# Patient Record
Sex: Female | Born: 1985
Health system: Southern US, Community
[De-identification: ages and names within clinical notes are randomized; demographics above are authoritative.]

## PROBLEM LIST (undated history)

## (undated) ENCOUNTER — Inpatient Hospital Stay (HOSPITAL_COMMUNITY): Payer: Self-pay

## (undated) DIAGNOSIS — D573 Sickle-cell trait: Secondary | ICD-10-CM

## (undated) DIAGNOSIS — O139 Gestational [pregnancy-induced] hypertension without significant proteinuria, unspecified trimester: Secondary | ICD-10-CM

## (undated) DIAGNOSIS — Z87898 Personal history of other specified conditions: Secondary | ICD-10-CM

## (undated) DIAGNOSIS — A609 Anogenital herpesviral infection, unspecified: Secondary | ICD-10-CM

## (undated) DIAGNOSIS — Z8619 Personal history of other infectious and parasitic diseases: Secondary | ICD-10-CM

## (undated) DIAGNOSIS — O24419 Gestational diabetes mellitus in pregnancy, unspecified control: Secondary | ICD-10-CM

## (undated) DIAGNOSIS — Z8742 Personal history of other diseases of the female genital tract: Secondary | ICD-10-CM

## (undated) DIAGNOSIS — R011 Cardiac murmur, unspecified: Secondary | ICD-10-CM

## (undated) DIAGNOSIS — Z8719 Personal history of other diseases of the digestive system: Secondary | ICD-10-CM

## (undated) DIAGNOSIS — F32A Depression, unspecified: Secondary | ICD-10-CM

## (undated) DIAGNOSIS — F329 Major depressive disorder, single episode, unspecified: Secondary | ICD-10-CM

## (undated) DIAGNOSIS — D649 Anemia, unspecified: Secondary | ICD-10-CM

## (undated) DIAGNOSIS — F419 Anxiety disorder, unspecified: Secondary | ICD-10-CM

## (undated) DIAGNOSIS — R87629 Unspecified abnormal cytological findings in specimens from vagina: Secondary | ICD-10-CM

## (undated) DIAGNOSIS — I10 Essential (primary) hypertension: Secondary | ICD-10-CM

## (undated) DIAGNOSIS — G43909 Migraine, unspecified, not intractable, without status migrainosus: Secondary | ICD-10-CM

## (undated) DIAGNOSIS — K219 Gastro-esophageal reflux disease without esophagitis: Secondary | ICD-10-CM

## (undated) HISTORY — DX: Gestational diabetes mellitus in pregnancy, unspecified control: O24.419

## (undated) HISTORY — DX: Major depressive disorder, single episode, unspecified: F32.9

## (undated) HISTORY — PX: COLPOSCOPY: SHX161

## (undated) HISTORY — DX: Unspecified abnormal cytological findings in specimens from vagina: R87.629

## (undated) HISTORY — DX: Anogenital herpesviral infection, unspecified: A60.9

## (undated) HISTORY — DX: Depression, unspecified: F32.A

## (undated) HISTORY — PX: OTHER SURGICAL HISTORY: SHX169

## (undated) HISTORY — DX: Gestational (pregnancy-induced) hypertension without significant proteinuria, unspecified trimester: O13.9

---

## 2007-10-09 HISTORY — PX: DILATION AND CURETTAGE OF UTERUS: SHX78

## 2008-09-20 ENCOUNTER — Emergency Department (HOSPITAL_COMMUNITY): Admission: EM | Admit: 2008-09-20 | Discharge: 2008-09-20 | Payer: Self-pay | Admitting: *Deleted

## 2008-12-11 ENCOUNTER — Ambulatory Visit: Payer: Self-pay | Admitting: Obstetrics and Gynecology

## 2008-12-11 ENCOUNTER — Inpatient Hospital Stay (HOSPITAL_COMMUNITY): Admission: AD | Admit: 2008-12-11 | Discharge: 2008-12-11 | Payer: Self-pay | Admitting: Obstetrics & Gynecology

## 2009-02-01 ENCOUNTER — Inpatient Hospital Stay (HOSPITAL_COMMUNITY): Admission: AD | Admit: 2009-02-01 | Discharge: 2009-02-01 | Payer: Self-pay | Admitting: Obstetrics & Gynecology

## 2009-02-18 ENCOUNTER — Emergency Department (HOSPITAL_COMMUNITY): Admission: EM | Admit: 2009-02-18 | Discharge: 2009-02-18 | Payer: Self-pay | Admitting: Emergency Medicine

## 2009-02-28 ENCOUNTER — Inpatient Hospital Stay (HOSPITAL_COMMUNITY): Admission: AD | Admit: 2009-02-28 | Discharge: 2009-02-28 | Payer: Self-pay | Admitting: Obstetrics & Gynecology

## 2009-03-27 ENCOUNTER — Emergency Department (HOSPITAL_COMMUNITY): Admission: EM | Admit: 2009-03-27 | Discharge: 2009-03-27 | Payer: Self-pay | Admitting: Emergency Medicine

## 2009-04-15 ENCOUNTER — Emergency Department (HOSPITAL_COMMUNITY): Admission: EM | Admit: 2009-04-15 | Discharge: 2009-04-15 | Payer: Self-pay | Admitting: Emergency Medicine

## 2009-04-27 ENCOUNTER — Ambulatory Visit: Payer: Self-pay | Admitting: Obstetrics and Gynecology

## 2009-04-27 ENCOUNTER — Encounter: Payer: Self-pay | Admitting: Physician Assistant

## 2009-04-27 LAB — CONVERTED CEMR LAB: TSH: 0.906 microintl units/mL (ref 0.350–4.500)

## 2009-04-30 ENCOUNTER — Emergency Department (HOSPITAL_COMMUNITY): Admission: EM | Admit: 2009-04-30 | Discharge: 2009-04-30 | Payer: Self-pay | Admitting: Emergency Medicine

## 2009-05-12 ENCOUNTER — Inpatient Hospital Stay (HOSPITAL_COMMUNITY): Admission: AD | Admit: 2009-05-12 | Discharge: 2009-05-12 | Payer: Self-pay | Admitting: Obstetrics & Gynecology

## 2009-06-05 ENCOUNTER — Emergency Department (HOSPITAL_COMMUNITY): Admission: EM | Admit: 2009-06-05 | Discharge: 2009-06-06 | Payer: Self-pay | Admitting: Emergency Medicine

## 2009-08-11 ENCOUNTER — Emergency Department (HOSPITAL_COMMUNITY): Admission: EM | Admit: 2009-08-11 | Discharge: 2009-08-11 | Payer: Self-pay | Admitting: Emergency Medicine

## 2009-10-01 ENCOUNTER — Inpatient Hospital Stay (HOSPITAL_COMMUNITY): Admission: AD | Admit: 2009-10-01 | Discharge: 2009-10-01 | Payer: Self-pay | Admitting: Obstetrics & Gynecology

## 2009-10-21 ENCOUNTER — Ambulatory Visit: Payer: Self-pay | Admitting: Obstetrics & Gynecology

## 2009-10-31 ENCOUNTER — Encounter: Admission: RE | Admit: 2009-10-31 | Discharge: 2009-10-31 | Payer: Self-pay | Admitting: Obstetrics & Gynecology

## 2009-11-02 ENCOUNTER — Encounter: Admission: RE | Admit: 2009-11-02 | Discharge: 2009-11-02 | Payer: Self-pay | Admitting: Obstetrics & Gynecology

## 2009-12-14 ENCOUNTER — Emergency Department (HOSPITAL_COMMUNITY): Admission: EM | Admit: 2009-12-14 | Discharge: 2009-12-15 | Payer: Self-pay | Admitting: Emergency Medicine

## 2009-12-21 ENCOUNTER — Emergency Department (HOSPITAL_COMMUNITY): Admission: EM | Admit: 2009-12-21 | Discharge: 2009-12-22 | Payer: Self-pay | Admitting: Emergency Medicine

## 2009-12-25 ENCOUNTER — Emergency Department (HOSPITAL_COMMUNITY): Admission: EM | Admit: 2009-12-25 | Discharge: 2009-12-25 | Payer: Self-pay | Admitting: Emergency Medicine

## 2009-12-26 ENCOUNTER — Emergency Department (HOSPITAL_COMMUNITY): Admission: EM | Admit: 2009-12-26 | Discharge: 2009-12-27 | Payer: Self-pay | Admitting: Emergency Medicine

## 2010-06-18 ENCOUNTER — Inpatient Hospital Stay (HOSPITAL_COMMUNITY): Admission: AD | Admit: 2010-06-18 | Discharge: 2010-06-18 | Payer: Self-pay | Admitting: Obstetrics

## 2010-06-18 ENCOUNTER — Ambulatory Visit: Payer: Self-pay | Admitting: Family

## 2010-07-01 ENCOUNTER — Emergency Department (HOSPITAL_COMMUNITY)
Admission: EM | Admit: 2010-07-01 | Discharge: 2010-07-01 | Payer: Self-pay | Source: Home / Self Care | Admitting: Emergency Medicine

## 2010-08-02 ENCOUNTER — Inpatient Hospital Stay (HOSPITAL_COMMUNITY)
Admission: AD | Admit: 2010-08-02 | Discharge: 2010-08-02 | Payer: Self-pay | Source: Home / Self Care | Admitting: Obstetrics

## 2010-08-03 ENCOUNTER — Inpatient Hospital Stay (HOSPITAL_COMMUNITY)
Admission: AD | Admit: 2010-08-03 | Discharge: 2010-08-03 | Payer: Self-pay | Source: Home / Self Care | Admitting: Obstetrics & Gynecology

## 2010-08-03 ENCOUNTER — Ambulatory Visit: Payer: Self-pay | Admitting: Nurse Practitioner

## 2010-08-03 DIAGNOSIS — N949 Unspecified condition associated with female genital organs and menstrual cycle: Secondary | ICD-10-CM

## 2010-08-03 DIAGNOSIS — N925 Other specified irregular menstruation: Secondary | ICD-10-CM

## 2010-09-14 ENCOUNTER — Emergency Department (HOSPITAL_COMMUNITY): Admission: EM | Admit: 2010-09-14 | Discharge: 2009-12-22 | Payer: Self-pay | Admitting: Emergency Medicine

## 2010-10-18 ENCOUNTER — Emergency Department (HOSPITAL_COMMUNITY)
Admission: EM | Admit: 2010-10-18 | Discharge: 2010-10-18 | Payer: Self-pay | Source: Home / Self Care | Admitting: Emergency Medicine

## 2010-10-23 LAB — PREGNANCY, URINE: Preg Test, Ur: NEGATIVE

## 2010-10-23 LAB — DIFFERENTIAL
Basophils Absolute: 0.1 10*3/uL (ref 0.0–0.1)
Basophils Relative: 1 % (ref 0–1)
Eosinophils Absolute: 0.3 10*3/uL (ref 0.0–0.7)
Eosinophils Relative: 3 % (ref 0–5)
Lymphocytes Relative: 31 % (ref 12–46)
Lymphs Abs: 2.3 10*3/uL (ref 0.7–4.0)
Monocytes Absolute: 0.5 10*3/uL (ref 0.1–1.0)
Monocytes Relative: 6 % (ref 3–12)
Neutro Abs: 4.4 10*3/uL (ref 1.7–7.7)
Neutrophils Relative %: 59 % (ref 43–77)

## 2010-10-23 LAB — URINALYSIS, ROUTINE W REFLEX MICROSCOPIC
Bilirubin Urine: NEGATIVE
Ketones, ur: NEGATIVE mg/dL
Leukocytes, UA: NEGATIVE
Nitrite: NEGATIVE
Protein, ur: NEGATIVE mg/dL
Specific Gravity, Urine: 1.027 (ref 1.005–1.030)
Urine Glucose, Fasting: NEGATIVE mg/dL
Urobilinogen, UA: 1 mg/dL (ref 0.0–1.0)
pH: 6.5 (ref 5.0–8.0)

## 2010-10-23 LAB — COMPREHENSIVE METABOLIC PANEL
ALT: 10 U/L (ref 0–35)
AST: 13 U/L (ref 0–37)
Albumin: 3.8 g/dL (ref 3.5–5.2)
Alkaline Phosphatase: 70 U/L (ref 39–117)
BUN: 13 mg/dL (ref 6–23)
CO2: 27 mEq/L (ref 19–32)
Calcium: 9.7 mg/dL (ref 8.4–10.5)
Chloride: 104 mEq/L (ref 96–112)
Creatinine, Ser: 0.52 mg/dL (ref 0.4–1.2)
GFR calc Af Amer: >60 mL/min (ref >60)
GFR calc non Af Amer: >60 mL/min (ref >60)
Glucose, Bld: 101 mg/dL — ABNORMAL HIGH (ref 70–99)
Potassium: 3.8 mEq/L (ref 3.5–5.1)
Sodium: 138 mEq/L (ref 135–145)
Total Bilirubin: 0.4 mg/dL (ref 0.3–1.2)
Total Protein: 7.5 g/dL (ref 6.0–8.3)

## 2010-10-23 LAB — CBC
HCT: 32.6 % — ABNORMAL LOW (ref 36.0–46.0)
Hemoglobin: 11.1 g/dL — ABNORMAL LOW (ref 12.0–15.0)
MCH: 25.9 pg — ABNORMAL LOW (ref 26.0–34.0)
MCHC: 34 g/dL (ref 30.0–36.0)
MCV: 76.2 fL — ABNORMAL LOW (ref 78.0–100.0)
Platelets: 383 10*3/uL (ref 150–400)
RBC: 4.28 MIL/uL (ref 3.87–5.11)
RDW: 15.1 % (ref 11.5–15.5)
WBC: 7.5 10*3/uL (ref 4.0–10.5)

## 2010-10-23 LAB — URINE MICROSCOPIC-ADD ON

## 2010-10-23 LAB — LIPASE, BLOOD: Lipase: 21 U/L (ref 11–59)

## 2010-10-29 ENCOUNTER — Encounter: Payer: Self-pay | Admitting: Obstetrics & Gynecology

## 2010-10-30 ENCOUNTER — Encounter: Payer: Self-pay | Admitting: Obstetrics & Gynecology

## 2010-11-15 ENCOUNTER — Other Ambulatory Visit: Payer: Self-pay | Admitting: Obstetrics & Gynecology

## 2010-11-15 DIAGNOSIS — Z1231 Encounter for screening mammogram for malignant neoplasm of breast: Secondary | ICD-10-CM

## 2010-11-20 ENCOUNTER — Ambulatory Visit: Payer: Self-pay

## 2010-11-30 ENCOUNTER — Emergency Department (HOSPITAL_COMMUNITY)
Admission: EM | Admit: 2010-11-30 | Discharge: 2010-11-30 | Disposition: A | Discharge status: Home / Self Care | Payer: Medicare Other | Attending: Emergency Medicine | Admitting: Emergency Medicine

## 2010-11-30 DIAGNOSIS — W540XXA Bitten by dog, initial encounter: Secondary | ICD-10-CM | POA: Insufficient documentation

## 2010-11-30 DIAGNOSIS — F3289 Other specified depressive episodes: Secondary | ICD-10-CM | POA: Insufficient documentation

## 2010-11-30 DIAGNOSIS — R51 Headache: Secondary | ICD-10-CM | POA: Insufficient documentation

## 2010-11-30 DIAGNOSIS — S51809A Unspecified open wound of unspecified forearm, initial encounter: Secondary | ICD-10-CM | POA: Insufficient documentation

## 2010-11-30 DIAGNOSIS — F329 Major depressive disorder, single episode, unspecified: Secondary | ICD-10-CM | POA: Insufficient documentation

## 2010-12-06 ENCOUNTER — Emergency Department (HOSPITAL_COMMUNITY)
Admission: EM | Admit: 2010-12-06 | Discharge: 2010-12-06 | Disposition: A | Discharge status: Home / Self Care | Payer: Medicare Other | Attending: Internal Medicine | Admitting: Internal Medicine

## 2010-12-06 DIAGNOSIS — R5381 Other malaise: Secondary | ICD-10-CM | POA: Insufficient documentation

## 2010-12-06 DIAGNOSIS — D573 Sickle-cell trait: Secondary | ICD-10-CM | POA: Insufficient documentation

## 2010-12-06 DIAGNOSIS — J3489 Other specified disorders of nose and nasal sinuses: Secondary | ICD-10-CM | POA: Insufficient documentation

## 2010-12-06 DIAGNOSIS — R079 Chest pain, unspecified: Secondary | ICD-10-CM | POA: Insufficient documentation

## 2010-12-06 DIAGNOSIS — J069 Acute upper respiratory infection, unspecified: Secondary | ICD-10-CM | POA: Insufficient documentation

## 2010-12-20 LAB — URINALYSIS, ROUTINE W REFLEX MICROSCOPIC
Bilirubin Urine: NEGATIVE
Glucose, UA: NEGATIVE mg/dL
Ketones, ur: NEGATIVE mg/dL
Leukocytes, UA: NEGATIVE
Nitrite: NEGATIVE
Protein, ur: NEGATIVE mg/dL
Specific Gravity, Urine: >1.03 — ABNORMAL HIGH (ref 1.005–1.030)
Urobilinogen, UA: 1 mg/dL (ref 0.0–1.0)
pH: 6 (ref 5.0–8.0)

## 2010-12-20 LAB — URINE MICROSCOPIC-ADD ON

## 2010-12-20 LAB — CBC
HCT: 29.8 % — ABNORMAL LOW (ref 36.0–46.0)
Hemoglobin: 10 g/dL — ABNORMAL LOW (ref 12.0–15.0)
MCHC: 33.6 g/dL (ref 30.0–36.0)
MCV: 80.9 fL (ref 78.0–100.0)
RDW: 15 % (ref 11.5–15.5)

## 2010-12-20 LAB — GC/CHLAMYDIA PROBE AMP, GENITAL: Chlamydia, DNA Probe: NEGATIVE

## 2010-12-21 LAB — URINALYSIS, ROUTINE W REFLEX MICROSCOPIC
Bilirubin Urine: NEGATIVE
Glucose, UA: NEGATIVE mg/dL
Ketones, ur: NEGATIVE mg/dL
Ketones, ur: NEGATIVE mg/dL
Leukocytes, UA: NEGATIVE
Leukocytes, UA: NEGATIVE
Nitrite: NEGATIVE
Protein, ur: NEGATIVE mg/dL
Specific Gravity, Urine: 1.024 (ref 1.005–1.030)
Urobilinogen, UA: 1 mg/dL (ref 0.0–1.0)
pH: 6.5 (ref 5.0–8.0)
pH: 6 (ref 5.0–8.0)

## 2010-12-21 LAB — BASIC METABOLIC PANEL
CO2: 26 mEq/L (ref 19–32)
Calcium: 9.2 mg/dL (ref 8.4–10.5)
Chloride: 105 mEq/L (ref 96–112)
GFR calc Af Amer: >60 mL/min (ref >60)
Glucose, Bld: 103 mg/dL — ABNORMAL HIGH (ref 70–99)
Sodium: 137 mEq/L (ref 135–145)

## 2010-12-21 LAB — CBC
HCT: 32.6 % — ABNORMAL LOW (ref 36.0–46.0)
Hemoglobin: 11.1 g/dL — ABNORMAL LOW (ref 12.0–15.0)
MCH: 27.2 pg (ref 26.0–34.0)
MCHC: 33.9 g/dL (ref 30.0–36.0)
MCV: 80.1 fL (ref 78.0–100.0)
RBC: 4.07 MIL/uL (ref 3.87–5.11)

## 2010-12-21 LAB — DIFFERENTIAL
Basophils Relative: 1 % (ref 0–1)
Eosinophils Absolute: 0.3 10*3/uL (ref 0.0–0.7)
Eosinophils Relative: 4 % (ref 0–5)
Lymphs Abs: 2.3 10*3/uL (ref 0.7–4.0)
Monocytes Absolute: 0.5 10*3/uL (ref 0.1–1.0)
Monocytes Relative: 8 % (ref 3–12)
Neutrophils Relative %: 55 % (ref 43–77)

## 2010-12-21 LAB — URINE MICROSCOPIC-ADD ON

## 2010-12-21 LAB — WET PREP, GENITAL
Trich, Wet Prep: NONE SEEN
Yeast Wet Prep HPF POC: NONE SEEN

## 2010-12-21 LAB — POCT PREGNANCY, URINE: Preg Test, Ur: NEGATIVE

## 2010-12-21 LAB — GC/CHLAMYDIA PROBE AMP, GENITAL: GC Probe Amp, Genital: NEGATIVE

## 2010-12-21 LAB — HCG, QUANTITATIVE, PREGNANCY: hCG, Beta Chain, Quant, S: <2 m[IU]/mL (ref <5)

## 2011-01-01 LAB — URINE MICROSCOPIC-ADD ON

## 2011-01-01 LAB — POCT PREGNANCY, URINE: Preg Test, Ur: NEGATIVE

## 2011-01-01 LAB — URINALYSIS, ROUTINE W REFLEX MICROSCOPIC
Bilirubin Urine: NEGATIVE
Bilirubin Urine: NEGATIVE
Ketones, ur: NEGATIVE mg/dL
Nitrite: NEGATIVE
Specific Gravity, Urine: 1.022 (ref 1.005–1.030)
Specific Gravity, Urine: 1.028 (ref 1.005–1.030)
Urobilinogen, UA: 0.2 mg/dL (ref 0.0–1.0)
pH: 6 (ref 5.0–8.0)
pH: 6.5 (ref 5.0–8.0)

## 2011-01-01 LAB — HEPATIC FUNCTION PANEL
Alkaline Phosphatase: 65 U/L (ref 39–117)
Total Bilirubin: 0.2 mg/dL — ABNORMAL LOW (ref 0.3–1.2)
Total Protein: 6.5 g/dL (ref 6.0–8.3)

## 2011-01-01 LAB — CBC
Hemoglobin: 9.9 g/dL — ABNORMAL LOW (ref 12.0–15.0)
RBC: 3.69 MIL/uL — ABNORMAL LOW (ref 3.87–5.11)
WBC: 7.8 10*3/uL (ref 4.0–10.5)

## 2011-01-01 LAB — DIFFERENTIAL
Basophils Absolute: 0.1 10*3/uL (ref 0.0–0.1)
Basophils Relative: 1 % (ref 0–1)
Eosinophils Absolute: 0.3 10*3/uL (ref 0.0–0.7)
Monocytes Absolute: 0.5 10*3/uL (ref 0.1–1.0)
Monocytes Relative: 7 % (ref 3–12)
Neutro Abs: 4.3 10*3/uL (ref 1.7–7.7)
Neutrophils Relative %: 55 % (ref 43–77)

## 2011-01-01 LAB — POCT I-STAT, CHEM 8
Hemoglobin: 10.2 g/dL — ABNORMAL LOW (ref 12.0–15.0)
Potassium: 3.4 mEq/L — ABNORMAL LOW (ref 3.5–5.1)
Sodium: 142 mEq/L (ref 135–145)
TCO2: 27 mmol/L (ref 0–100)

## 2011-01-08 LAB — CBC
HCT: 33 % — ABNORMAL LOW (ref 36.0–46.0)
Hemoglobin: 11.1 g/dL — ABNORMAL LOW (ref 12.0–15.0)
MCHC: 33.5 g/dL (ref 30.0–36.0)
MCV: 81.8 fL (ref 78.0–100.0)
RDW: 15.4 % (ref 11.5–15.5)

## 2011-01-08 LAB — URINE MICROSCOPIC-ADD ON

## 2011-01-08 LAB — WET PREP, GENITAL: Yeast Wet Prep HPF POC: NONE SEEN

## 2011-01-08 LAB — URINALYSIS, ROUTINE W REFLEX MICROSCOPIC
Ketones, ur: 15 mg/dL — AB
Leukocytes, UA: NEGATIVE
Nitrite: NEGATIVE
Protein, ur: 30 mg/dL — AB
Urobilinogen, UA: 1 mg/dL (ref 0.0–1.0)

## 2011-01-08 LAB — GC/CHLAMYDIA PROBE AMP, GENITAL
Chlamydia, DNA Probe: NEGATIVE
GC Probe Amp, Genital: NEGATIVE

## 2011-01-13 LAB — URINALYSIS, ROUTINE W REFLEX MICROSCOPIC
Hgb urine dipstick: NEGATIVE
Nitrite: NEGATIVE
Protein, ur: 30 mg/dL — AB
Urobilinogen, UA: 1 mg/dL (ref 0.0–1.0)

## 2011-01-13 LAB — WET PREP, GENITAL
Clue Cells Wet Prep HPF POC: NONE SEEN
Trich, Wet Prep: NONE SEEN
Yeast Wet Prep HPF POC: NONE SEEN
Yeast Wet Prep HPF POC: NONE SEEN

## 2011-01-13 LAB — RPR: RPR Ser Ql: NONREACTIVE

## 2011-01-13 LAB — POCT PREGNANCY, URINE: Preg Test, Ur: NEGATIVE

## 2011-01-13 LAB — CBC
HCT: 32.5 % — ABNORMAL LOW (ref 36.0–46.0)
Hemoglobin: 11 g/dL — ABNORMAL LOW (ref 12.0–15.0)
MCHC: 33.8 g/dL (ref 30.0–36.0)
RDW: 17.6 % — ABNORMAL HIGH (ref 11.5–15.5)

## 2011-01-13 LAB — URINE MICROSCOPIC-ADD ON

## 2011-01-16 LAB — URINALYSIS, ROUTINE W REFLEX MICROSCOPIC
Bilirubin Urine: NEGATIVE
Bilirubin Urine: NEGATIVE
Ketones, ur: NEGATIVE mg/dL
Ketones, ur: NEGATIVE mg/dL
Nitrite: NEGATIVE
Nitrite: NEGATIVE
Protein, ur: NEGATIVE mg/dL
Specific Gravity, Urine: >1.03 — ABNORMAL HIGH (ref 1.005–1.030)
pH: 6 (ref 5.0–8.0)
pH: 6 (ref 5.0–8.0)

## 2011-01-16 LAB — WET PREP, GENITAL
Trich, Wet Prep: NONE SEEN
Yeast Wet Prep HPF POC: NONE SEEN

## 2011-01-16 LAB — DIFFERENTIAL
Basophils Absolute: 0 10*3/uL (ref 0.0–0.1)
Basophils Relative: 1 % (ref 0–1)
Eosinophils Absolute: 0.3 10*3/uL (ref 0.0–0.7)
Eosinophils Relative: 4 % (ref 0–5)
Eosinophils Relative: 5 % (ref 0–5)
Lymphocytes Relative: 35 % (ref 12–46)
Lymphocytes Relative: 31 % (ref 12–46)
Lymphs Abs: 2.2 10*3/uL (ref 0.7–4.0)
Monocytes Absolute: 0.5 10*3/uL (ref 0.1–1.0)
Monocytes Relative: 6 % (ref 3–12)
Neutrophils Relative %: 57 % (ref 43–77)

## 2011-01-16 LAB — CBC
HCT: 34.6 % — ABNORMAL LOW (ref 36.0–46.0)
HCT: 32.4 % — ABNORMAL LOW (ref 36.0–46.0)
Hemoglobin: 12 g/dL (ref 12.0–15.0)
MCHC: 34.6 g/dL (ref 30.0–36.0)
MCV: 79.5 fL (ref 78.0–100.0)
Platelets: 377 10*3/uL (ref 150–400)
Platelets: 409 10*3/uL — ABNORMAL HIGH (ref 150–400)
RBC: 4.11 MIL/uL (ref 3.87–5.11)
RDW: 17.7 % — ABNORMAL HIGH (ref 11.5–15.5)
WBC: 7.2 10*3/uL (ref 4.0–10.5)

## 2011-01-16 LAB — RPR: RPR Ser Ql: NONREACTIVE

## 2011-01-16 LAB — POCT I-STAT, CHEM 8
BUN: 12 mg/dL (ref 6–23)
Creatinine, Ser: 0.8 mg/dL (ref 0.4–1.2)
Glucose, Bld: 87 mg/dL (ref 70–99)
Potassium: 3.5 mEq/L (ref 3.5–5.1)
Sodium: 139 mEq/L (ref 135–145)

## 2011-01-16 LAB — URINE MICROSCOPIC-ADD ON

## 2011-01-17 LAB — CBC
HCT: 33.1 % — ABNORMAL LOW (ref 36.0–46.0)
Hemoglobin: 11.4 g/dL — ABNORMAL LOW (ref 12.0–15.0)
MCHC: 34.6 g/dL (ref 30.0–36.0)
MCV: 80.4 fL (ref 78.0–100.0)
RBC: 4.12 MIL/uL (ref 3.87–5.11)

## 2011-01-17 LAB — URINE MICROSCOPIC-ADD ON

## 2011-01-17 LAB — URINALYSIS, ROUTINE W REFLEX MICROSCOPIC
Glucose, UA: NEGATIVE mg/dL
Specific Gravity, Urine: 1.025 (ref 1.005–1.030)

## 2011-01-17 LAB — GC/CHLAMYDIA PROBE AMP, GENITAL: GC Probe Amp, Genital: NEGATIVE

## 2011-02-14 ENCOUNTER — Emergency Department (HOSPITAL_COMMUNITY)
Admission: EM | Admit: 2011-02-14 | Discharge: 2011-02-14 | Discharge status: Against Medical Advice | Payer: Medicare Other | Attending: Emergency Medicine | Admitting: Emergency Medicine

## 2011-02-14 ENCOUNTER — Inpatient Hospital Stay (HOSPITAL_COMMUNITY)
Admission: AD | Admit: 2011-02-14 | Discharge: 2011-02-15 | Disposition: A | Discharge status: Home / Self Care | Payer: Medicare Other | Source: Ambulatory Visit | Attending: Family Medicine | Admitting: Family Medicine

## 2011-02-14 DIAGNOSIS — N390 Urinary tract infection, site not specified: Secondary | ICD-10-CM | POA: Insufficient documentation

## 2011-02-14 DIAGNOSIS — R1084 Generalized abdominal pain: Secondary | ICD-10-CM | POA: Insufficient documentation

## 2011-02-14 DIAGNOSIS — R109 Unspecified abdominal pain: Secondary | ICD-10-CM | POA: Insufficient documentation

## 2011-02-14 DIAGNOSIS — R0602 Shortness of breath: Secondary | ICD-10-CM | POA: Insufficient documentation

## 2011-02-14 LAB — URINALYSIS, ROUTINE W REFLEX MICROSCOPIC
Bilirubin Urine: NEGATIVE
Glucose, UA: NEGATIVE mg/dL
Ketones, ur: 15 mg/dL — AB
Protein, ur: 100 mg/dL — AB
Urobilinogen, UA: 4 mg/dL — ABNORMAL HIGH (ref 0.0–1.0)
pH: 6.5 (ref 5.0–8.0)

## 2011-02-14 LAB — URINE MICROSCOPIC-ADD ON

## 2011-02-15 LAB — DIFFERENTIAL
Lymphocytes Relative: 32 % (ref 12–46)
Lymphs Abs: 2.6 10*3/uL (ref 0.7–4.0)
Monocytes Relative: 9 % (ref 3–12)
Neutro Abs: 4.4 10*3/uL (ref 1.7–7.7)
Neutrophils Relative %: 55 % (ref 43–77)

## 2011-02-15 LAB — WET PREP, GENITAL
Trich, Wet Prep: NONE SEEN
Yeast Wet Prep HPF POC: NONE SEEN

## 2011-02-15 LAB — CBC
HCT: 30.1 % — ABNORMAL LOW (ref 36.0–46.0)
Hemoglobin: 10.2 g/dL — ABNORMAL LOW (ref 12.0–15.0)
MCH: 25.6 pg — ABNORMAL LOW (ref 26.0–34.0)
MCV: 75.6 fL — ABNORMAL LOW (ref 78.0–100.0)
RBC: 3.98 MIL/uL (ref 3.87–5.11)

## 2011-02-15 LAB — GC/CHLAMYDIA PROBE AMP, GENITAL: GC Probe Amp, Genital: NEGATIVE

## 2011-02-16 LAB — URINE CULTURE
Colony Count: 60000
Culture  Setup Time: 201205100523

## 2011-02-20 NOTE — Group Therapy Note (Signed)
NAME:  Tiffany Hall, Tiffany Hall             ACCOUNT NO.:  [id number]   MEDICAL RECORD NO.:  [id number]          PATIENT TYPE:  WOC   LOCATION:  [facility]                   FACILITY:  [facility]   PHYSICIAN:  Argentina Donovan, MD        DATE OF BIRTH:  1986/03/24   DATE OF SERVICE:  04/27/2009                                  CLINIC NOTE   The patient presents today as a new patient for annual Pap smear and  physical.  The patient has no complaints today.  She has no allergies,  currently not taking any medications.  Her immunizations are up-to-date  for tetanus and flu.   MENSTRUAL HISTORY:  The first day of her last menstrual history is April 13, 2009.  Menarche was at age 73.  Menstrual cycles are regular every 28-  30 days, it  lasted between 2 and 5 days.  She has mild pain with  periods and no bleeding between them.   CONTRACEPTIVE HISTORY:  She is not currently taking anything for birth  control as she does desire pregnancy.   OBSTETRICAL HISTORY:  She is pregnant 4 times.  She has 3 living  children.  She has 1 miscarriage that happened in March 2009 that did  require D and C at [facility].   GYNECOLOGICAL HISTORY:  Her last Pap smear was in April 2009 and it was  abnormal and did require a colposcopy.   SURGICAL HISTORY:  She has never had any surgeries other than  colposcopy.  She has had a blood transfusions with her D and C in March  2009.   FAMILY HISTORY:  Positive for diabetes, high blood pressure and cancer.  Her mother and grandmother are both positive for breast cancer.   PERSONAL HISTORY:  Positive for heart murmur that does not require  premedications and gallstones.   SOCIAL HISTORY:  She lives with her boyfriend and her 2 children.  She  does not smoke.  She does not drink alcohol.   REVIEW OF SYSTEMS:  She has occasional back pain.  She is complaining of  some night sweats, fatigue, weight gain and hot flashes.   PHYSICAL EXAMINATION:  GENERAL:  [name] is a  pleasant African  American female.  She appears to be in no apparent distress.  VITAL SIGNS:  Stable.  Her blood pressures 108/73.  Her weight is the  same as 152.  She states she gained 3 pounds in the last 2 weeks.  HEENT:  Grossly normal.  She has no thyromegaly.  HEART:  Regular rate and rhythm with no murmur.  No bruits.  LUNGS:  Clear to auscultation bilaterally A and P.  BREASTS:  Symmetrical without retractions or dimpling.  Nipples are  erect without discharge.  No masses and nontender.  ABDOMEN:  Slightly obese with irregular striae, nontender.  No  hepatosplenomegaly.  Pulses are equal and bounding bilaterally.  PELVIC:  Genitalia, she is Tanner V.  She has irregular rugae.  Mucous membranes  are pink with good tone.  No vaginal discharge noted.  Cervix is pink  without lesions, nonfriable.  Her uterus  is midline and nontender and  not enlarged.  Her adnexa are not enlarged and nontender.  RECTAL:  External rectal exam is negative.  Internal exam was not  performed.  EXTREMITIES:  Warm to touch, equal hair distribution.   ASSESSMENT AND PLAN:  1. Normal gynecologic exam.  2. Desiring pregnancy.  3. Strong breast cancer risk factors.   PLAN:  1. Pap smear, gonorrhea and chlamydia and other STI screenings has      been today.  2. The patient has been counseled towards preconception counseling and      prescription given for prenatal vitamins.  3. Baseline mammogram given the patient's strong family history for      breast cancer.  We will also draw a TSH today given the patient's      other symptoms and refer for primary care.  The patient should      return with positive pregnancy test, annually or p.r.n. problems.     ______________________________  Maylon Cos, CNM    ______________________________  Argentina Donovan, MD    SS/MEDQ  D:  04/27/2009  T:  04/28/2009  Job:  [id number]

## 2011-04-27 ENCOUNTER — Inpatient Hospital Stay (HOSPITAL_COMMUNITY)
Admission: AD | Admit: 2011-04-27 | Discharge: 2011-04-28 | Disposition: A | Discharge status: Home / Self Care | Payer: Medicare Other | Source: Ambulatory Visit | Attending: Obstetrics and Gynecology | Admitting: Obstetrics and Gynecology

## 2011-04-27 DIAGNOSIS — H6120 Impacted cerumen, unspecified ear: Secondary | ICD-10-CM

## 2011-04-27 DIAGNOSIS — R42 Dizziness and giddiness: Secondary | ICD-10-CM

## 2011-04-27 DIAGNOSIS — J069 Acute upper respiratory infection, unspecified: Secondary | ICD-10-CM

## 2011-04-27 HISTORY — DX: Migraine, unspecified, not intractable, without status migrainosus: G43.909

## 2011-04-27 HISTORY — DX: Sickle-cell trait: D57.3

## 2011-04-28 ENCOUNTER — Encounter (HOSPITAL_COMMUNITY): Payer: Self-pay

## 2011-04-28 LAB — URINALYSIS, ROUTINE W REFLEX MICROSCOPIC
Bilirubin Urine: NEGATIVE
Ketones, ur: NEGATIVE mg/dL
Leukocytes, UA: NEGATIVE
Nitrite: NEGATIVE
Protein, ur: NEGATIVE mg/dL
pH: 6 (ref 5.0–8.0)

## 2011-04-28 LAB — URINE MICROSCOPIC-ADD ON

## 2011-04-28 NOTE — ED Provider Notes (Signed)
History     Chief Complaint  Patient presents with  . Dizziness   HPI Has dizziness when standing for past 2 days.  Feels fine when lying down.  Currently texting on phone.  OB History    Grav Para Term Preterm Abortions TAB SAB Ect Mult Living   5 3 3  2  2   3       Past Medical History  Diagnosis Date  . Migraine headache   . Ovarian cyst   . Sickle cell trait   . Gallstones   . Abnormal Pap smear   . Chlamydia   . Trichomonas   . Gonorrhea     Past Surgical History  Procedure Date  . Dilation and curettage of uterus   . Colposcopy     Family History  Problem Relation Age of Onset  . Diabetes Mother   . Hypertension Mother   . Hepatitis Mother   . Cancer Mother   . Diabetes Maternal Grandmother   . Hypertension Maternal Grandmother   . Cancer Maternal Grandmother     History  Substance Use Topics  . Smoking status: Current Some Day Smoker    Types: Cigarettes  . Smokeless tobacco: Not on file  . Alcohol Use: 0.6 oz/week    1 Shots of liquor per week     vodka    Allergies: No Known Allergies  Prescriptions prior to admission  Medication Sig Dispense Refill  . Naproxen Sodium (ALEVE PO) Take 1 tablet by mouth daily.          Review of Systems  Constitutional: Negative for fever.  HENT: Positive for congestion.   Eyes: Negative for redness.  Respiratory: Positive for cough. Negative for shortness of breath.   Gastrointestinal: Negative for nausea and vomiting.   Physical Exam   Blood pressure 115/77, pulse 72, temperature 98.8 F (37.1 C), resp. rate 16, height 5' 3.5" (1.613 m), weight 150 lb 6 oz (68.21 kg), last menstrual period 04/15/2011.  Physical Exam  Nursing note and vitals reviewed. Constitutional: She is oriented to person, place, and time. She appears well-developed and well-nourished.  HENT:  Head: Normocephalic.       Left ear, canal filled with dark wax - unable to visualize TM Right ear, some dark wax, TM full and bulging  with good light reflex,  Nares - some mucus seen in nares,  Pharynx - slightly red, tonsils not swollen Has some congested cough  Eyes: EOM are normal.  Neck: Neck supple.  GI: Soft.  Musculoskeletal: Normal range of motion.  Neurological: She is alert and oriented to person, place, and time.  Skin: Skin is warm and dry.  Psychiatric: She has a normal mood and affect.    MAU Course  Procedures Results for orders placed during the hospital encounter of 04/27/11 (from the past 24 hour(s))  URINALYSIS, ROUTINE W REFLEX MICROSCOPIC     Status: Abnormal   Collection Time   04/28/11 12:27 AM      Component Value Range   Color, Urine YELLOW  YELLOW    Appearance CLEAR  CLEAR    Specific Gravity, Urine >1.030 (*) 1.005 - 1.030    pH 6.0  5.0 - 8.0    Glucose, UA NEGATIVE  NEGATIVE (mg/dL)   Hgb urine dipstick MODERATE (*) NEGATIVE    Bilirubin Urine NEGATIVE  NEGATIVE    Ketones, ur NEGATIVE  NEGATIVE (mg/dL)   Protein, ur NEGATIVE  NEGATIVE (mg/dL)   Urobilinogen, UA 1.0  0.0 - 1.0 (mg/dL)   Nitrite NEGATIVE  NEGATIVE    Leukocytes, UA NEGATIVE  NEGATIVE   URINE MICROSCOPIC-ADD ON     Status: Abnormal   Collection Time   04/28/11 12:27 AM      Component Value Range   Squamous Epithelial / LPF MANY (*) RARE    WBC, UA 0-2  <3 (WBC/hpf)   RBC / HPF 3-6  <3 (RBC/hpf)   Urine-Other MUCOUS PRESENT    POCT PREGNANCY, URINE     Status: Normal   Collection Time   04/28/11 12:35 AM      Component Value Range   Preg Test, Ur NEGATIVE      MDM Fullness in ear is likely the cause of dizziness.  Client has mild URI symptoms and dehydration by urine.  Is in no distress currently.  Has not fallen due to dizziness.  Assessment and Plan  Dizziness Mild URI Blocked left ear canal  Plan: Drink at least 8 8-oz glasses of water every day. You can take some over the counter Sudafed by the package directions to help with the congestion and perhaps will help the dizziness improve also. You  can get some Debrox over the counter and use by the package directions in your left ear canal to help clear the wax. If your symptoms worsen, you can be seen at the [facility] for further evaluation.  Tiffany Hall 04/28/2011, 1:04 AM

## 2011-04-28 NOTE — Progress Notes (Signed)
Patient is here with c/o dizziness for 2 days. She denies any pain, headache. She does not think that she is pregnant. Her lmp 7/8 and it was normal

## 2011-04-28 NOTE — Progress Notes (Signed)
Pt states, " I have been dizzy for 2 days. When I stand up from sitting I feel like Im on a roller coaster. My whole head is spinning."

## 2011-05-11 ENCOUNTER — Emergency Department (HOSPITAL_COMMUNITY)
Admission: EM | Admit: 2011-05-11 | Discharge: 2011-05-12 | Disposition: A | Discharge status: Home / Self Care | Payer: Medicare Other | Attending: Emergency Medicine | Admitting: Emergency Medicine

## 2011-05-11 DIAGNOSIS — J329 Chronic sinusitis, unspecified: Secondary | ICD-10-CM | POA: Insufficient documentation

## 2011-05-11 DIAGNOSIS — R5381 Other malaise: Secondary | ICD-10-CM | POA: Insufficient documentation

## 2011-05-11 DIAGNOSIS — R059 Cough, unspecified: Secondary | ICD-10-CM | POA: Insufficient documentation

## 2011-05-11 DIAGNOSIS — R5383 Other fatigue: Secondary | ICD-10-CM | POA: Insufficient documentation

## 2011-05-11 DIAGNOSIS — IMO0001 Reserved for inherently not codable concepts without codable children: Secondary | ICD-10-CM | POA: Insufficient documentation

## 2011-05-11 DIAGNOSIS — R599 Enlarged lymph nodes, unspecified: Secondary | ICD-10-CM | POA: Insufficient documentation

## 2011-05-11 DIAGNOSIS — J069 Acute upper respiratory infection, unspecified: Secondary | ICD-10-CM | POA: Insufficient documentation

## 2011-05-11 DIAGNOSIS — R05 Cough: Secondary | ICD-10-CM | POA: Insufficient documentation

## 2011-05-11 DIAGNOSIS — J3489 Other specified disorders of nose and nasal sinuses: Secondary | ICD-10-CM | POA: Insufficient documentation

## 2011-05-11 DIAGNOSIS — H9209 Otalgia, unspecified ear: Secondary | ICD-10-CM | POA: Insufficient documentation

## 2011-05-25 ENCOUNTER — Emergency Department (HOSPITAL_COMMUNITY): Payer: Medicare Other

## 2011-05-25 ENCOUNTER — Emergency Department (HOSPITAL_COMMUNITY)
Admission: EM | Admit: 2011-05-25 | Discharge: 2011-05-25 | Disposition: A | Discharge status: Home / Self Care | Payer: Medicare Other | Attending: Emergency Medicine | Admitting: Emergency Medicine

## 2011-05-25 DIAGNOSIS — R109 Unspecified abdominal pain: Secondary | ICD-10-CM | POA: Insufficient documentation

## 2011-05-25 DIAGNOSIS — R945 Abnormal results of liver function studies: Secondary | ICD-10-CM

## 2011-05-25 DIAGNOSIS — D573 Sickle-cell trait: Secondary | ICD-10-CM | POA: Insufficient documentation

## 2011-05-25 DIAGNOSIS — K8020 Calculus of gallbladder without cholecystitis without obstruction: Secondary | ICD-10-CM | POA: Insufficient documentation

## 2011-05-25 DIAGNOSIS — R748 Abnormal levels of other serum enzymes: Secondary | ICD-10-CM | POA: Insufficient documentation

## 2011-05-25 LAB — COMPREHENSIVE METABOLIC PANEL
ALT: 1036 U/L — ABNORMAL HIGH (ref 0–35)
AST: 783 U/L — ABNORMAL HIGH (ref 0–37)
Albumin: 3.9 g/dL (ref 3.5–5.2)
Alkaline Phosphatase: 192 U/L — ABNORMAL HIGH (ref 39–117)
Potassium: 4 mEq/L (ref 3.5–5.1)
Sodium: 137 mEq/L (ref 135–145)
Total Protein: 7.4 g/dL (ref 6.0–8.3)

## 2011-05-25 LAB — DIFFERENTIAL
Basophils Absolute: 0 10*3/uL (ref 0.0–0.1)
Basophils Relative: 1 % (ref 0–1)
Eosinophils Relative: 3 % (ref 0–5)
Monocytes Absolute: 0.5 10*3/uL (ref 0.1–1.0)
Neutro Abs: 3.2 10*3/uL (ref 1.7–7.7)

## 2011-05-25 LAB — CBC
MCHC: 34.3 g/dL (ref 30.0–36.0)
Platelets: 397 10*3/uL (ref 150–400)
RDW: 16.1 % — ABNORMAL HIGH (ref 11.5–15.5)
WBC: 5.2 10*3/uL (ref 4.0–10.5)

## 2011-05-25 LAB — URINALYSIS, ROUTINE W REFLEX MICROSCOPIC
Ketones, ur: NEGATIVE mg/dL
Leukocytes, UA: NEGATIVE
Nitrite: NEGATIVE
Specific Gravity, Urine: 1.029 (ref 1.005–1.030)
Urobilinogen, UA: 4 mg/dL — ABNORMAL HIGH (ref 0.0–1.0)

## 2011-05-25 LAB — LIPASE, BLOOD: Lipase: 28 U/L (ref 11–59)

## 2011-05-25 LAB — POCT PREGNANCY, URINE
Preg Test, Ur: NEGATIVE
Preg Test, Ur: NEGATIVE

## 2011-06-07 NOTE — Consult Note (Signed)
NAME:  Tiffany Hall, Tiffany Hall             ACCOUNT NO.:  [id number]  MEDICAL RECORD NO.:  [id number]  LOCATION:  [facility]                         FACILITY:  [facility]  PHYSICIAN:  Ardeth Sportsman, MD     DATE OF BIRTH:  [birth year]  DATE OF CONSULTATION:  05/25/2011 DATE OF DISCHARGE:                                CONSULTATION   CHIEF COMPLAINT:  Abdominal pain.  BRIEF HISTORY:  The patient is a 25 year old African American female who presents to the ER with abdominal pain started on her right side and went to her back.  She had pain throughout the day.  Yesterday, she had gastritis with a couple of beers.  She has had similar problems in the past with high fat foods.  She denies any nausea or vomiting currently. She had some diarrhea in the past, but none today.  Currently, her pain is better.  She states she had a history of attacks before approximately 3 years ago after eating steak and states pain was on the left.  She has a history of GERD and takes Gas-X frequently and was told at one time, she had gastritis, although she has never had an EGD.  We were asked to see in consultation after an evaluation in the ER.  Ultrasound was obtained which shows gallstones, but Murphy sign was negative.  Common bile duct was normal.  The gallbladder was contracted with wall thickening measured at 4 mm.  Common bile duct was 5.3 mm.  LABORATORY DATA:  Shows a white count of 5200, hemoglobin 10.6, and platelets 397,000.  UA was negative.  Urine pregnancy was also negative. Electrolytes were normal.  BUN was 3.9, creatinine was less than 0.47, SGOT was 783, SGPT was 1036, alk phos 192, bilirubin was 1.0.  PAST MEDICAL HISTORY: 1. Anemia. 2. Sickle cell trait. 3. Ovarian cyst. 4. GERD/gastritis by history.  PAST SURGICAL HISTORY:  None.  FAMILY HISTORY:  Father unknown.  Mother is positive for diabetes, peptic ulcer disease, cancer, hepatitis C, liver disease, and gastritis. Alcohol, social drugs,  remote history of cocaine use.  REVIEW OF SYSTEMS:  CV:  Negative.  PULMONARY:  Negative.  CARDIAC: Negative.  GI:  As above.  GU:  Negative.  GYN:  Negative.  LOWER EXTREMITIES:  Negative.  MUSCULOSKELETAL:  Negative.  MEDICATIONS:  She was started on amoxicillin about a week ago for lymphadenopathy of uncertain etiology in the ER.  ALLERGIES:  None known.  PHYSICAL EXAMINATION:  GENERAL:  Well-nourished, well-developed black female, in no acute distress. VITAL SIGNS:  Temperature 98.7, heart rate 82, blood pressure 112/71, respiratory rate is 20, and sats of 98% on room air. HEENT:  Within normal limits. NECK:  No bruits.  No JVD.  No thyromegaly.  Neck shows trachea midline. Thyroid is nonpalpable.  No JVD. CHEST:  Clear to auscultation and percussion. CARDIAC:  Normal S1 and S2.  No murmur or rub was heard.  Pulses are +2 and equal in upper and lower extremities. ABDOMEN:  Soft, nontender.  Positive bowel sounds.  No palpable hepatosplenomegaly.  No abscesses, masses, or hernia. GU/RECTAL:  Deferred.  Lymphadenopathy none palpated. MUSCULOSKELETAL:  No changes.SKIN:  No changes. NEUROLOGIC:  Cranial  nerves grossly intact.  No focal deficits. PSYCH:  Normal affect.  IMPRESSION: 1. Abdominal pain. 2. Cholelithiasis. 3. History of gastritis. 4. Elevated transaminase.  Physical exam by Dr. Michaell Cowing shows she currently has no tenderness.  No abdominal pain.  Her white count is normal.  PLAN:  It is Dr. Gordy Savers opinion that she should go home on Prilosec b.i.d.  If the pain returns or the prilose c does not resolve the symptoms, she should an appointment as an outpatient and we can discuss having her gallbladder removed.  GAstroenterology evaluation is another possibility if the patient declines surgery and has atypical symptoms.  Followup as an outpatient.  She was given a card with instructions to follow up in our office.  We will call and check on her early next  week.     Eber Hong, P.A.   ______________________________ Ardeth Sportsman, MD    WDJ/MEDQ  D:  05/25/2011  T:  05/25/2011  Job:  [id number]  Electronically Signed by Sherrie George P.A. on 05/31/2011 09:22:40 PM Electronically Signed by Karie Soda MD on 06/07/2011 08:20:14 AM

## 2011-06-13 ENCOUNTER — Ambulatory Visit (INDEPENDENT_AMBULATORY_CARE_PROVIDER_SITE_OTHER): Payer: Medicaid Other | Admitting: Surgery

## 2011-06-25 ENCOUNTER — Ambulatory Visit (INDEPENDENT_AMBULATORY_CARE_PROVIDER_SITE_OTHER): Payer: Medicaid Other | Admitting: Surgery

## 2011-07-09 ENCOUNTER — Ambulatory Visit (INDEPENDENT_AMBULATORY_CARE_PROVIDER_SITE_OTHER): Payer: Medicare Other | Admitting: Surgery

## 2011-07-11 ENCOUNTER — Emergency Department (HOSPITAL_COMMUNITY)
Admission: EM | Admit: 2011-07-11 | Discharge: 2011-07-11 | Disposition: A | Discharge status: Home / Self Care | Payer: Medicare Other | Attending: Emergency Medicine | Admitting: Emergency Medicine

## 2011-07-11 DIAGNOSIS — J069 Acute upper respiratory infection, unspecified: Secondary | ICD-10-CM | POA: Insufficient documentation

## 2011-07-11 DIAGNOSIS — D573 Sickle-cell trait: Secondary | ICD-10-CM | POA: Insufficient documentation

## 2011-07-11 DIAGNOSIS — R059 Cough, unspecified: Secondary | ICD-10-CM | POA: Insufficient documentation

## 2011-07-11 DIAGNOSIS — R05 Cough: Secondary | ICD-10-CM | POA: Insufficient documentation

## 2011-07-11 DIAGNOSIS — IMO0001 Reserved for inherently not codable concepts without codable children: Secondary | ICD-10-CM | POA: Insufficient documentation

## 2011-07-13 LAB — URINALYSIS, ROUTINE W REFLEX MICROSCOPIC
Bilirubin Urine: NEGATIVE
Glucose, UA: NEGATIVE mg/dL
Hgb urine dipstick: NEGATIVE
Nitrite: NEGATIVE
Specific Gravity, Urine: 1.028 (ref 1.005–1.030)
pH: 6.5 (ref 5.0–8.0)

## 2011-07-13 LAB — URINE CULTURE: Colony Count: NO GROWTH

## 2011-07-13 LAB — POCT PREGNANCY, URINE: Preg Test, Ur: NEGATIVE

## 2011-07-13 LAB — WET PREP, GENITAL: Trich, Wet Prep: NONE SEEN

## 2011-07-20 ENCOUNTER — Encounter (INDEPENDENT_AMBULATORY_CARE_PROVIDER_SITE_OTHER): Payer: Self-pay | Admitting: Surgery

## 2011-08-02 ENCOUNTER — Emergency Department (HOSPITAL_COMMUNITY)
Admission: EM | Admit: 2011-08-02 | Discharge: 2011-08-02 | Disposition: A | Discharge status: Home / Self Care | Payer: Medicare Other | Attending: Emergency Medicine | Admitting: Emergency Medicine

## 2011-08-02 DIAGNOSIS — H9209 Otalgia, unspecified ear: Secondary | ICD-10-CM | POA: Insufficient documentation

## 2011-08-02 DIAGNOSIS — F17200 Nicotine dependence, unspecified, uncomplicated: Secondary | ICD-10-CM | POA: Insufficient documentation

## 2011-08-31 ENCOUNTER — Encounter (HOSPITAL_COMMUNITY): Payer: Self-pay | Admitting: Emergency Medicine

## 2011-08-31 ENCOUNTER — Emergency Department (HOSPITAL_COMMUNITY)
Admission: EM | Admit: 2011-08-31 | Discharge: 2011-09-01 | Disposition: A | Discharge status: Home / Self Care | Payer: Medicare Other | Attending: Emergency Medicine | Admitting: Emergency Medicine

## 2011-08-31 DIAGNOSIS — Z0389 Encounter for observation for other suspected diseases and conditions ruled out: Secondary | ICD-10-CM | POA: Insufficient documentation

## 2011-08-31 NOTE — ED Notes (Signed)
Pt c/o frontal HA, bumps to crown of head x 3 days. ? Swollen lymphnode behind R ear.

## 2011-09-01 ENCOUNTER — Encounter (HOSPITAL_COMMUNITY): Payer: Self-pay

## 2011-09-01 ENCOUNTER — Emergency Department (HOSPITAL_COMMUNITY)
Admission: EM | Admit: 2011-09-01 | Discharge: 2011-09-01 | Disposition: A | Discharge status: Home / Self Care | Payer: Medicare Other | Attending: Emergency Medicine | Admitting: Emergency Medicine

## 2011-09-01 DIAGNOSIS — L02818 Cutaneous abscess of other sites: Secondary | ICD-10-CM | POA: Insufficient documentation

## 2011-09-01 DIAGNOSIS — L0291 Cutaneous abscess, unspecified: Secondary | ICD-10-CM

## 2011-09-01 DIAGNOSIS — R51 Headache: Secondary | ICD-10-CM | POA: Insufficient documentation

## 2011-09-01 DIAGNOSIS — L03818 Cellulitis of other sites: Secondary | ICD-10-CM | POA: Insufficient documentation

## 2011-09-01 DIAGNOSIS — F17200 Nicotine dependence, unspecified, uncomplicated: Secondary | ICD-10-CM | POA: Insufficient documentation

## 2011-09-01 MED ORDER — DOXYCYCLINE HYCLATE 100 MG PO CAPS: 100.0000 mg | ORAL_CAPSULE | Freq: Two times a day (BID) | ORAL | Status: AC

## 2011-09-01 MED ORDER — OXYCODONE-ACETAMINOPHEN 5-325 MG PO TABS: 1.0000 | ORAL_TABLET | Freq: Four times a day (QID) | ORAL | Status: AC | PRN

## 2011-09-01 MED ORDER — OXYCODONE-ACETAMINOPHEN 5-325 MG PO TABS
1.0000 | ORAL_TABLET | Freq: Once | ORAL | Status: DC
  Filled 2011-09-01: qty 1

## 2011-09-01 NOTE — ED Notes (Signed)
Pt presents with no acute distress and denies injury.  Pt reporst "bump" to top of head very painful with white drainage.  This Clinical research associate did not see drainage upon assessment however quite tender to palpation

## 2011-09-01 NOTE — ED Notes (Signed)
Seen this morning for presenting complaint however did not stay

## 2011-09-01 NOTE — ED Provider Notes (Signed)
Medical screening examination/treatment/procedure(s) were performed by non-physician practitioner and as supervising physician I was immediately available for consultation/collaboration.   Joya Gaskins, MD 09/01/11 [phone]

## 2011-09-01 NOTE — ED Provider Notes (Signed)
History     CSN: [id number] Arrival date & time: 09/01/2011  4:18 PM   First MD Initiated Contact with Patient 09/01/11 1707      Chief Complaint  Patient presents with  . Abscess  . Headache    (Consider location/radiation/quality/duration/timing/severity/associated sxs/prior treatment) Patient is a 25 y.o. female presenting with abscess and headaches. The history is provided by the patient.  Abscess  This is a new problem. The onset was sudden. The problem has been gradually improving. The abscess is present on the head. The problem is mild. The abscess is characterized by redness and draining. Pertinent negatives include no anorexia, no decrease in physical activity, not sleeping less, not drinking less, no fever, no fussiness, not sleeping more, no diarrhea, no vomiting, no congestion, no rhinorrhea, no sore throat, no decreased responsiveness and no cough. Her past medical history is significant for skin abscesses in family. There were no sick contacts. She has received no recent medical care.  Headache  Pertinent negatives include no anorexia, no fever and no vomiting.   Pt presents to the ED with complaints of abscess ontop of head a a mild headache since messing with the bump on hjer head. She states that it drained this whitish fluid this morning and her husband took a picture of it.  Past Medical History  Diagnosis Date  . Migraine headache   . Ovarian cyst   . Sickle cell trait   . Gallstones   . Abnormal Pap smear   . Chlamydia   . Trichomonas   . Gonorrhea     Past Surgical History  Procedure Date  . Dilation and curettage of uterus   . Colposcopy     Family History  Problem Relation Age of Onset  . Diabetes Mother   . Hypertension Mother   . Hepatitis Mother   . Cancer Mother   . Diabetes Maternal Grandmother   . Hypertension Maternal Grandmother   . Cancer Maternal Grandmother     History  Substance Use Topics  . Smoking status: Current Some Day  Smoker    Types: Cigarettes  . Smokeless tobacco: Not on file  . Alcohol Use: 0.6 oz/week    1 Shots of liquor per week     vodka    OB History    Grav Para Term Preterm Abortions TAB SAB Ect Mult Living   5 3 3  2  2   3       Review of Systems  Constitutional: Negative for fever and decreased responsiveness.  HENT: Negative for congestion, sore throat and rhinorrhea.   Respiratory: Negative for cough.   Gastrointestinal: Negative for vomiting, diarrhea and anorexia.  Neurological: Positive for headaches.  All other systems reviewed and are negative.    Allergies  Review of patient's allergies indicates no known allergies.  Home Medications   Current Outpatient Rx  Name Route Sig Dispense Refill  . NAPROXEN SODIUM 220 MG PO TABS Oral Take 440 mg by mouth 2 (two) times daily with a meal.      . SERTRALINE HCL 50 MG PO TABS Oral Take 50 mg by mouth daily.        BP 106/59  Pulse 79  Temp(Src) 98.1 F (36.7 C) (Oral)  Resp 16  Wt 154 lb (69.854 kg)  SpO2 99%  LMP 08/27/2011  Physical Exam  Nursing note and vitals reviewed. Constitutional: She appears well-developed and well-nourished.  HENT:  Head: Normocephalic and atraumatic.    Eyes: Conjunctivae are  normal. Pupils are equal, round, and reactive to light.  Neck: Trachea normal, normal range of motion and full passive range of motion without pain. Neck supple.  Cardiovascular: Normal rate, regular rhythm and normal pulses.   Pulmonary/Chest: Effort normal and breath sounds normal. Chest wall is not dull to percussion. She exhibits no tenderness, no crepitus, no edema, no deformity and no retraction.  Abdominal: Soft. Normal appearance and bowel sounds are normal.  Musculoskeletal: Normal range of motion.  Lymphadenopathy:       Head (right side): No submental, no submandibular, no tonsillar, no preauricular, no posterior auricular and no occipital adenopathy present.       Head (left side): No submental, no  submandibular, no tonsillar, no preauricular, no posterior auricular and no occipital adenopathy present.    She has no cervical adenopathy.    She has no axillary adenopathy.  Neurological: She is alert. She has normal strength.  Skin: Skin is warm, dry and intact.  Psychiatric: She has a normal mood and affect. Her speech is normal and behavior is normal. Judgment and thought content normal. Cognition and memory are normal.    ED Course  Procedures (including critical care time)  Labs Reviewed - No data to display No results found.   No diagnosis found.    MDM         Dorthula Matas, PA 09/01/11 [phone]

## 2011-09-26 ENCOUNTER — Inpatient Hospital Stay (HOSPITAL_COMMUNITY)
Admission: AD | Admit: 2011-09-26 | Discharge: 2011-09-27 | Disposition: A | Discharge status: Home / Self Care | Payer: Medicare Other | Source: Ambulatory Visit | Attending: Obstetrics and Gynecology | Admitting: Obstetrics and Gynecology

## 2011-09-26 ENCOUNTER — Encounter (HOSPITAL_COMMUNITY): Payer: Self-pay | Admitting: *Deleted

## 2011-09-26 DIAGNOSIS — A499 Bacterial infection, unspecified: Secondary | ICD-10-CM

## 2011-09-26 DIAGNOSIS — N760 Acute vaginitis: Secondary | ICD-10-CM

## 2011-09-26 DIAGNOSIS — M545 Low back pain, unspecified: Secondary | ICD-10-CM | POA: Insufficient documentation

## 2011-09-26 DIAGNOSIS — R51 Headache: Secondary | ICD-10-CM

## 2011-09-26 DIAGNOSIS — O2390 Unspecified genitourinary tract infection in pregnancy, unspecified trimester: Secondary | ICD-10-CM | POA: Insufficient documentation

## 2011-09-26 DIAGNOSIS — M549 Dorsalgia, unspecified: Secondary | ICD-10-CM

## 2011-09-26 DIAGNOSIS — B9689 Other specified bacterial agents as the cause of diseases classified elsewhere: Secondary | ICD-10-CM | POA: Insufficient documentation

## 2011-09-26 DIAGNOSIS — O26899 Other specified pregnancy related conditions, unspecified trimester: Secondary | ICD-10-CM

## 2011-09-26 LAB — DIFFERENTIAL
Basophils Absolute: 0 10*3/uL (ref 0.0–0.1)
Basophils Relative: 0 % (ref 0–1)
Eosinophils Absolute: 0.3 10*3/uL (ref 0.0–0.7)
Monocytes Absolute: 0.9 10*3/uL (ref 0.1–1.0)
Monocytes Relative: 9 % (ref 3–12)
Neutrophils Relative %: 55 % (ref 43–77)

## 2011-09-26 LAB — CBC
Hemoglobin: 10.8 g/dL — ABNORMAL LOW (ref 12.0–15.0)
MCH: 26.2 pg (ref 26.0–34.0)
MCHC: 34.4 g/dL (ref 30.0–36.0)
RDW: 15.6 % — ABNORMAL HIGH (ref 11.5–15.5)

## 2011-09-26 NOTE — Progress Notes (Signed)
Pt sttes she found out 4 days ago she is pregnant-is now complaining of breast pain,headache and back pain all week

## 2011-09-27 ENCOUNTER — Inpatient Hospital Stay (HOSPITAL_COMMUNITY): Payer: Medicare Other

## 2011-09-27 ENCOUNTER — Encounter (HOSPITAL_COMMUNITY): Payer: Self-pay | Admitting: *Deleted

## 2011-09-27 LAB — ABO/RH: ABO/RH(D): O POS

## 2011-09-27 LAB — GC/CHLAMYDIA PROBE AMP, GENITAL
Chlamydia, DNA Probe: NEGATIVE
GC Probe Amp, Genital: NEGATIVE

## 2011-09-27 LAB — URINALYSIS, ROUTINE W REFLEX MICROSCOPIC
Bilirubin Urine: NEGATIVE
Glucose, UA: NEGATIVE mg/dL
Ketones, ur: NEGATIVE mg/dL
Protein, ur: NEGATIVE mg/dL
pH: 6 (ref 5.0–8.0)

## 2011-09-27 LAB — WET PREP, GENITAL: Yeast Wet Prep HPF POC: NONE SEEN

## 2011-09-27 LAB — URINE MICROSCOPIC-ADD ON

## 2011-09-27 MED ORDER — IBUPROFEN 400 MG PO TABS
400.0000 mg | ORAL_TABLET | Freq: Once | ORAL | Status: AC
  Administered 2011-09-27: 400 mg via ORAL
  Filled 2011-09-27: qty 1

## 2011-09-27 MED ORDER — METRONIDAZOLE 500 MG PO TABS: 500.0000 mg | ORAL_TABLET | Freq: Two times a day (BID) | ORAL | Status: DC

## 2011-09-27 NOTE — ED Provider Notes (Signed)
History     CSN: [id number] Arrival date & time: 09/26/2011 11:04 PM   None     Chief Complaint  Patient presents with  . Breast Pain    HPI Tiffany Hall is a 25 y.o. female @ approximately [redacted] weeks gestation who presents to [facility] for headache and low back pain. She found out she was pregnant one week ago and since then has had frequent urination, breast tenderness, back pain and headache. She denies abdominal pain or nausea, vomiting.  Past Medical History  Diagnosis Date  . Migraine headache   . Ovarian cyst   . Sickle cell trait   . Gallstones   . Abnormal Pap smear   . Chlamydia   . Trichomonas   . Gonorrhea     Past Surgical History  Procedure Date  . Dilation and curettage of uterus   . Colposcopy     Family History  Problem Relation Age of Onset  . Diabetes Mother   . Hypertension Mother   . Hepatitis Mother   . Cancer Mother   . Diabetes Maternal Grandmother   . Hypertension Maternal Grandmother   . Cancer Maternal Grandmother     History  Substance Use Topics  . Smoking status: Current Some Day Smoker    Types: Cigarettes  . Smokeless tobacco: Not on file  . Alcohol Use: 0.6 oz/week    1 Shots of liquor per week     vodka    OB History    Grav Para Term Preterm Abortions TAB SAB Ect Mult Living   6 3 3  2  2   3       Review of Systems  Constitutional: Positive for fatigue. Negative for fever, chills and diaphoresis.  HENT: Negative for ear pain, congestion, sore throat, facial swelling, neck pain, neck stiffness, dental problem and sinus pressure.   Eyes: Negative for photophobia, pain and discharge.  Respiratory: Negative for cough, chest tightness and wheezing.   Cardiovascular: Negative.   Gastrointestinal: Negative for nausea, vomiting, abdominal pain, diarrhea, constipation and abdominal distention.  Genitourinary: Positive for frequency and vaginal discharge. Negative for dysuria, flank pain, vaginal bleeding and difficulty  urinating.  Musculoskeletal: Positive for back pain. Negative for myalgias and gait problem.  Skin: Negative for color change and rash.  Neurological: Positive for headaches. Negative for dizziness, speech difficulty, weakness, light-headedness and numbness.  Psychiatric/Behavioral: Negative for confusion and agitation.    Allergies  Review of patient's allergies indicates no known allergies.  Home Medications  No current outpatient prescriptions on file.  BP 132/60  Pulse 94  Temp 98.9 F (37.2 C)  Resp 20  Ht 5\' 4"  (1.626 m)  Wt 155 lb (70.308 kg)  BMI 26.61 kg/m2  SpO2 99%  LMP 08/27/2011  Physical Exam  Nursing note and vitals reviewed. Constitutional: She is oriented to person, place, and time. She appears well-developed and well-nourished.  HENT:  Head: Normocephalic.  Eyes: EOM are normal.  Neck: Neck supple.  Cardiovascular: Normal rate.   Pulmonary/Chest: Effort normal.  Abdominal: Soft. There is no tenderness.  Genitourinary:       External genitalia without lesions. White discharge vaginal vault. Cervix, closed and long. No CMT, no adnexal tenderness. Uterus without palpable enlargement.  Musculoskeletal: Normal range of motion.  Neurological: She is alert and oriented to person, place, and time. No cranial nerve deficit.  Skin: Skin is warm and dry.  Psychiatric: She has a normal mood and affect. Her behavior is normal. Judgment  and thought content normal.   Results for orders placed during the hospital encounter of 09/26/11 (from the past 24 hour(s))  CBC     Status: Abnormal   Collection Time   09/26/11 11:35 PM      Component Value Range   WBC 9.6  4.0 - 10.5 (K/uL)   RBC 4.13  3.87 - 5.11 (MIL/uL)   Hemoglobin 10.8 (*) 12.0 - 15.0 (g/dL)   HCT 40.9 (*) 81.1 - 46.0 (%)   MCV 76.0 (*) 78.0 - 100.0 (fL)   MCH 26.2  26.0 - 34.0 (pg)   MCHC 34.4  30.0 - 36.0 (g/dL)   RDW 91.4 (*) 78.2 - 15.5 (%)   Platelets 396  150 - 400 (K/uL)  DIFFERENTIAL      Status: Normal   Collection Time   09/26/11 11:35 PM      Component Value Range   Neutrophils Relative 55  43 - 77 (%)   Neutro Abs 5.3  1.7 - 7.7 (K/uL)   Lymphocytes Relative 32  12 - 46 (%)   Lymphs Abs 3.1  0.7 - 4.0 (K/uL)   Monocytes Relative 9  3 - 12 (%)   Monocytes Absolute 0.9  0.1 - 1.0 (K/uL)   Eosinophils Relative 3  0 - 5 (%)   Eosinophils Absolute 0.3  0.0 - 0.7 (K/uL)   Basophils Relative 0  0 - 1 (%)   Basophils Absolute 0.0  0.0 - 0.1 (K/uL)  HCG, QUANTITATIVE, PREGNANCY     Status: Abnormal   Collection Time   09/26/11 11:35 PM      Component Value Range   hCG, Beta Chain, Quant, S 907 (*) <5 (mIU/mL)  ABO/RH     Status: Normal   Collection Time   09/26/11 11:35 PM      Component Value Range   ABO/RH(D) O POS    URINALYSIS, ROUTINE W REFLEX MICROSCOPIC     Status: Abnormal   Collection Time   09/26/11 11:39 PM      Component Value Range   Color, Urine YELLOW  YELLOW    APPearance CLEAR  CLEAR    Specific Gravity, Urine 1.025  1.005 - 1.030    pH 6.0  5.0 - 8.0    Glucose, UA NEGATIVE  NEGATIVE (mg/dL)   Hgb urine dipstick SMALL (*) NEGATIVE    Bilirubin Urine NEGATIVE  NEGATIVE    Ketones, ur NEGATIVE  NEGATIVE (mg/dL)   Protein, ur NEGATIVE  NEGATIVE (mg/dL)   Urobilinogen, UA 1.0  0.0 - 1.0 (mg/dL)   Nitrite NEGATIVE  NEGATIVE    Leukocytes, UA NEGATIVE  NEGATIVE   URINE MICROSCOPIC-ADD ON     Status: Normal   Collection Time   09/26/11 11:39 PM      Component Value Range   Squamous Epithelial / LPF RARE  RARE    RBC / HPF 3-6  <3 (RBC/hpf)   Urine-Other MUCOUS PRESENT    POCT PREGNANCY, URINE     Status: Normal   Collection Time   09/26/11 11:47 PM      Component Value Range   Preg Test, Ur POSITIVE    WET PREP, GENITAL     Status: Abnormal   Collection Time   09/27/11 12:10 AM      Component Value Range   Yeast, Wet Prep NONE SEEN  NONE SEEN    Trich, Wet Prep NONE SEEN  NONE SEEN    Clue Cells, Wet Prep MANY (*) NONE SEEN  WBC, Wet  Prep HPF POC MODERATE (*) NONE SEEN    US Ob Comp Less 14 Wks  09/27/2011  *RADIOLOGY REPORT*  Clinical Data: Pelvic pain  OBSTETRIC <14 WK ULTRASOUND,TRANSVAGINAL OB ULTRASOUND  Technique:  Transabdominal ultrasound was performed?for evaluation of?the gestation, as well as the maternal uterus and adnexal regions.,Technique:  Transvaginal ultrasound was performed for evaluation of the gestation as well as the maternal uterus and a  Comparison: None  Findings: Single intrauterine gestational sac identified.  No yolk sac or embryo at this time.  Mean sac diameter of 2.9 mm, with an estimated age of 4 weeks 5 days.  EDC of 05/31/2012.  Ovaries have a normal sonographic appearance.  There is a small amount of fluid within the endometrial cavity.  No free fluid within the pelvis.  IMPRESSION: Intrauterine pregnancy with estimated age of 4 weeks 5 days based on mean sac diameter. No yolk sac or embryo at this time. Recommend follow-up ultrasound to document progression.  Original Report Authenticated By: Waneta Martins, M.D.   US Ob Transvaginal  09/27/2011  *RADIOLOGY REPORT*  Clinical Data: Pelvic pain  OBSTETRIC <14 WK ULTRASOUND,TRANSVAGINAL OB ULTRASOUND  Technique:  Transabdominal ultrasound was performed?for evaluation of?the gestation, as well as the maternal uterus and adnexal regions.,Technique:  Transvaginal ultrasound was performed for evaluation of the gestation as well as the maternal uterus and a  Comparison: None  Findings: Single intrauterine gestational sac identified.  No yolk sac or embryo at this time.  Mean sac diameter of 2.9 mm, with an estimated age of 4 weeks 5 days.  EDC of 05/31/2012.  Ovaries have a normal sonographic appearance.  There is a small amount of fluid within the endometrial cavity.  No free fluid within the pelvis.  IMPRESSION: Intrauterine pregnancy with estimated age of 4 weeks 5 days based on mean sac diameter. No yolk sac or embryo at this time. Recommend follow-up  ultrasound to document progression.  Original Report Authenticated By: Waneta Martins, M.D.   Assessment: First trimester pregnancy   Headache in pregnancy   Bacterial vaginosis  Plan:  Motrin 400 mg po now for headache   Treat BV   Return in 48 hours to repeat Bhcg to be sure there is a normal rise  ED Course: Re assessment: 01:30 am, patent feeling much better, back and head pain almost gone. Will take tylenol as needed and return in 48 hours for bhcg.  Procedures    MDM          Kerrie Buffalo, NP 09/27/11 0134

## 2011-09-27 NOTE — ED Provider Notes (Signed)
Attestation of Attending Supervision of Advanced Practitioner: Evaluation and management procedures were performed by the PA/NP/CNM/OB Fellow under my supervision/collaboration. Chart reviewed and agree with management and plan.  Tiffany Hall V 09/27/2011 7:50 AM

## 2011-09-28 ENCOUNTER — Inpatient Hospital Stay (HOSPITAL_COMMUNITY)
Admission: AD | Admit: 2011-09-28 | Discharge: 2011-09-28 | Disposition: A | Discharge status: Home / Self Care | Payer: Medicare Other | Source: Ambulatory Visit | Attending: Obstetrics and Gynecology | Admitting: Obstetrics and Gynecology

## 2011-09-28 DIAGNOSIS — O99891 Other specified diseases and conditions complicating pregnancy: Secondary | ICD-10-CM | POA: Insufficient documentation

## 2011-09-28 DIAGNOSIS — R109 Unspecified abdominal pain: Secondary | ICD-10-CM | POA: Insufficient documentation

## 2011-09-28 DIAGNOSIS — N949 Unspecified condition associated with female genital organs and menstrual cycle: Secondary | ICD-10-CM

## 2011-09-28 MED ORDER — NATACHEW 29-1 MG PO CHEW: 1.0000 | CHEWABLE_TABLET | Freq: Every day | ORAL | Status: DC

## 2011-09-28 NOTE — Progress Notes (Signed)
Pt was seen 2 days ago for early pregnancy presents with abdominal cramping same as 2 days ago, denies bleeding, here for repeat BHCG

## 2011-09-28 NOTE — ED Provider Notes (Signed)
History   Chief Complaint:  Abdominal Cramping   Tiffany Hall is  25 y.o. [id number] Patient's last menstrual period was 08/27/2011.[name] Patient is here for follow up of quantitative HCG and ongoing surveillance of pregnancy status.   She is 4.[redacted] weeks gestation  by LMP.    Since her last visit, the patient is without new complaint.   The patient reports bleeding as  none now.    General ROS:  negative  Her previous Quantitative HCG values are:  09/26/11 at 2335 HCG 907   Physical Exam   Blood pressure 132/68, pulse 82, temperature 98.7 F (37.1 C), temperature source Oral, resp. rate 16, last menstrual period 08/27/2011.  Focused Gynecological Exam: examination not indicated  Labs  Recent Results (from the past 24 hour(s))  HCG, QUANTITATIVE, PREGNANCY   Collection Time   09/28/11 10:09 AM      Component Value Range   hCG, Beta Chain, Quant, S 1631 (*) <5 (mIU/mL)    Ultrasound Studies:    Assessment: Early pregnancy w/ most likely normally rising quants (increased 80%, drawn 30 hours apart)  Plan: Per consult w/ Dr. Henderson Cloud, the patient is instructed to follow up in in 48 days for repeat quant.  Tiffany Hall 09/28/2011, 11:13 AM

## 2011-09-30 ENCOUNTER — Inpatient Hospital Stay (HOSPITAL_COMMUNITY)
Admission: AD | Admit: 2011-09-30 | Discharge: 2011-09-30 | Disposition: A | Discharge status: Home / Self Care | Payer: Medicare Other | Source: Ambulatory Visit | Attending: Obstetrics and Gynecology | Admitting: Obstetrics and Gynecology

## 2011-09-30 DIAGNOSIS — O99891 Other specified diseases and conditions complicating pregnancy: Secondary | ICD-10-CM | POA: Insufficient documentation

## 2011-09-30 DIAGNOSIS — R109 Unspecified abdominal pain: Secondary | ICD-10-CM | POA: Insufficient documentation

## 2011-09-30 LAB — HCG, QUANTITATIVE, PREGNANCY: hCG, Beta Chain, Quant, S: 3804 m[IU]/mL — ABNORMAL HIGH (ref <5)

## 2011-09-30 NOTE — ED Notes (Signed)
Patient was seen by Georges Mouse CNM

## 2011-09-30 NOTE — Progress Notes (Signed)
Patient presents for pregnancy hormone level, stated that she was dizzy yesterday but increased her fluid intake and denies any dizziness today, no bleeding, occasional mild cramps.

## 2011-09-30 NOTE — ED Notes (Signed)
Called Dr. Dareen Piano to notify about patient in [facility] [place] and would have to call me back, did not allow me to give report.

## 2011-10-06 ENCOUNTER — Inpatient Hospital Stay (HOSPITAL_COMMUNITY)
Admission: AD | Admit: 2011-10-06 | Discharge: 2011-10-06 | Disposition: A | Discharge status: Home / Self Care | Payer: Medicare Other | Source: Ambulatory Visit | Attending: Obstetrics & Gynecology | Admitting: Obstetrics & Gynecology

## 2011-10-06 ENCOUNTER — Encounter (HOSPITAL_COMMUNITY): Payer: Self-pay

## 2011-10-06 ENCOUNTER — Emergency Department: Payer: Self-pay | Admitting: Emergency Medicine

## 2011-10-06 DIAGNOSIS — O210 Mild hyperemesis gravidarum: Secondary | ICD-10-CM | POA: Insufficient documentation

## 2011-10-06 LAB — URINALYSIS, ROUTINE W REFLEX MICROSCOPIC
Ketones, ur: NEGATIVE mg/dL
Leukocytes, UA: NEGATIVE
Nitrite: NEGATIVE
Protein, ur: NEGATIVE mg/dL
Urobilinogen, UA: 1 mg/dL (ref 0.0–1.0)

## 2011-10-06 MED ORDER — PROMETHAZINE HCL 25 MG PO TABS: 25.0000 mg | ORAL_TABLET | Freq: Four times a day (QID) | ORAL | Status: DC | PRN

## 2011-10-06 NOTE — Progress Notes (Signed)
Pt reports having nasal congestion/runny nose and cough x 2 days. She states she has ben unable to eat x 2 days. Food makes here nauseated not vomited.

## 2011-10-06 NOTE — ED Provider Notes (Signed)
History     Chief Complaint  Patient presents with  . URI  . Nausea   HPI Tiffany Hall 25 y.o. 6w 4d by LMP.  Having nausea and is unable to eat.  Wants some Phenergan which she used in previous pregnancies for nausea.  Has an appointment in the office on Jan. 11 to begin prenatal care.  Denies any abdominal pain or any other problems today.  OB History    Grav Para Term Preterm Abortions TAB SAB Ect Mult Living   6 3 3  2  2   3       Past Medical History  Diagnosis Date  . Migraine headache   . Ovarian cyst   . Sickle cell trait   . Gallstones   . Abnormal Pap smear   . Chlamydia   . Trichomonas   . Gonorrhea     Past Surgical History  Procedure Date  . Dilation and curettage of uterus   . Colposcopy     Family History  Problem Relation Age of Onset  . Diabetes Mother   . Hypertension Mother   . Hepatitis Mother   . Cancer Mother   . Diabetes Maternal Grandmother   . Hypertension Maternal Grandmother   . Cancer Maternal Grandmother     History  Substance Use Topics  . Smoking status: Current Some Day Smoker    Types: Cigarettes  . Smokeless tobacco: Not on file  . Alcohol Use: 0.6 oz/week    1 Shots of liquor per week     vodka    Allergies: No Known Allergies  Prescriptions prior to admission  Medication Sig Dispense Refill  . Prenatal Vit-Fe Fumarate-FA (PRENATAL MULTIVITAMIN) TABS Take 1 tablet by mouth daily.        . metroNIDAZOLE (FLAGYL) 500 MG tablet Take 500 mg by mouth 2 (two) times daily. Finished 10/04/11       . DISCONTD: metroNIDAZOLE (FLAGYL) 500 MG tablet Take 1 tablet (500 mg total) by mouth 2 (two) times daily.  14 tablet  0    Review of Systems  Gastrointestinal: Positive for nausea. Negative for vomiting, abdominal pain, diarrhea and constipation.  Genitourinary: Negative for dysuria.   Physical Exam   Blood pressure 109/60, pulse 92, temperature 99.2 F (37.3 C), temperature source Oral, resp. rate 18, height 5\' 4"   (1.626 m), weight 157 lb (71.215 kg), last menstrual period 08/27/2011.  Physical Exam  Nursing note and vitals reviewed. Constitutional: She is oriented to person, place, and time. She appears well-developed and well-nourished.  HENT:  Head: Normocephalic.  Eyes: EOM are normal.  Neck: Neck supple.  Musculoskeletal: Normal range of motion.  Neurological: She is alert and oriented to person, place, and time.  Skin: Skin is warm and dry.  Psychiatric: She has a normal mood and affect.    [facility] Course  Procedures  MDM Results for orders placed during the hospital encounter of 10/06/11 (from the past 24 hour(s))  URINALYSIS, ROUTINE W REFLEX MICROSCOPIC     Status: Abnormal   Collection Time   10/06/11  6:20 PM      Component Value Range   Color, Urine YELLOW  YELLOW    APPearance CLEAR  CLEAR    Specific Gravity, Urine 1.025  1.005 - 1.030    pH 6.0  5.0 - 8.0    Glucose, UA NEGATIVE  NEGATIVE (mg/dL)   Hgb urine dipstick SMALL (*) NEGATIVE    Bilirubin Urine NEGATIVE  NEGATIVE  Ketones, ur NEGATIVE  NEGATIVE (mg/dL)   Protein, ur NEGATIVE  NEGATIVE (mg/dL)   Urobilinogen, UA 1.0  0.0 - 1.0 (mg/dL)   Nitrite NEGATIVE  NEGATIVE    Leukocytes, UA NEGATIVE  NEGATIVE   URINE MICROSCOPIC-ADD ON     Status: Abnormal   Collection Time   10/06/11  6:20 PM      Component Value Range   Squamous Epithelial / LPF FEW (*) RARE    WBC, UA    <3 (WBC/hpf)   Value: NO FORMED ELEMENTS SEEN ON URINE MICROSCOPIC EXAMINATION   RBC / HPF 0-2  <3 (RBC/hpf)   Bacteria, UA RARE  RARE      Assessment and Plan  Morning sickness  Plan rx phenergan for nausea Keep appointment in the office to begin prenatal care   BURLESON,TERRI 10/06/2011, 6:55 PM   Nolene Bernheim, NP 10/06/11 1906

## 2011-10-09 NOTE — L&D Delivery Note (Signed)
Delivery Note At 9:57 AM a viable female was delivered via Vaginal, Spontaneous Delivery (Presentation: Left Occiput Anterior).  APGAR: 9, 9; weight .   Placenta status: Intact, Spontaneous.  Cord: 3 vessels with the following complications: None.  Cord pH: none  Anesthesia: Epidural  Episiotomy: None Lacerations: None Suture Repair: none Est. Blood Loss (mL): 300  Mom to postpartum.  Baby to nursery-stable.  HARPER,CHARLES A 05/22/2012, 10:23 AM

## 2011-10-13 ENCOUNTER — Inpatient Hospital Stay (HOSPITAL_COMMUNITY)
Admission: AD | Admit: 2011-10-13 | Discharge: 2011-10-13 | Disposition: A | Discharge status: Home / Self Care | Payer: Medicare Other | Source: Ambulatory Visit | Attending: Obstetrics | Admitting: Obstetrics

## 2011-10-13 ENCOUNTER — Encounter (HOSPITAL_COMMUNITY): Payer: Self-pay | Admitting: *Deleted

## 2011-10-13 DIAGNOSIS — M6208 Separation of muscle (nontraumatic), other site: Secondary | ICD-10-CM

## 2011-10-13 DIAGNOSIS — I491 Atrial premature depolarization: Secondary | ICD-10-CM

## 2011-10-13 DIAGNOSIS — O99891 Other specified diseases and conditions complicating pregnancy: Secondary | ICD-10-CM | POA: Insufficient documentation

## 2011-10-13 DIAGNOSIS — R1033 Periumbilical pain: Secondary | ICD-10-CM | POA: Insufficient documentation

## 2011-10-13 DIAGNOSIS — I499 Cardiac arrhythmia, unspecified: Secondary | ICD-10-CM | POA: Insufficient documentation

## 2011-10-13 LAB — URINALYSIS, ROUTINE W REFLEX MICROSCOPIC
Bilirubin Urine: NEGATIVE
Ketones, ur: NEGATIVE mg/dL
Nitrite: NEGATIVE
Specific Gravity, Urine: 1.015 (ref 1.005–1.030)
Urobilinogen, UA: 0.2 mg/dL (ref 0.0–1.0)
pH: 7 (ref 5.0–8.0)

## 2011-10-13 LAB — URINE MICROSCOPIC-ADD ON

## 2011-10-13 MED ORDER — ONDANSETRON 4 MG PO TBDP: 4.0000 mg | ORAL_TABLET | Freq: Four times a day (QID) | ORAL | Status: AC | PRN

## 2011-10-13 MED ORDER — PRENATAL RX 60-1 MG PO TABS: 1.0000 | ORAL_TABLET | Freq: Every day | ORAL | Status: DC

## 2011-10-13 NOTE — Progress Notes (Signed)
C/o pain approx 1 inch above umbilicus for 3 days, has h/o gallstones, palpitations intermittently for 2 days, stated has increased caffeine intake lately.

## 2011-10-13 NOTE — ED Provider Notes (Signed)
Chief Complaint:  Irregular Heart Beat and periumbilical pain  Tiffany Hall is  26 y.o. [id number].  Patient's last menstrual period was 08/27/2011.[name]  Her pregnancy status is positive.  She presents complaining of Irregular Heart Beat (occ. "flutters" that last a few seconds without other associated symptoms) and periumbilical pain for about 2 days.  Pt states she has been craving tea the past few days and drinking a lot of it.  Requests Rx for Zofran and PNV   OB History    Grav Para Term Preterm Abortions TAB SAB Ect Mult Living   5 3 3  1  1   3        Past Medical History  Diagnosis Date  . Migraine headache   . Ovarian cyst   . Sickle cell trait   . Gallstones   . Abnormal Pap smear   . Chlamydia   . Trichomonas   . Gonorrhea     Past Surgical History  Procedure Date  . Dilation and curettage of uterus   . Colposcopy     Family History  Problem Relation Age of Onset  . Diabetes Mother   . Hypertension Mother   . Hepatitis Mother   . Cancer Mother   . Diabetes Maternal Grandmother   . Hypertension Maternal Grandmother   . Cancer Maternal Grandmother     History  Substance Use Topics  . Smoking status: Current Some Day Smoker    Types: Cigarettes  . Smokeless tobacco: Not on file  . Alcohol Use: No    Allergies: No Known Allergies  Prescriptions prior to admission  Medication Sig Dispense Refill  . calcium carbonate (TUMS - DOSED IN MG ELEMENTAL CALCIUM) 500 MG chewable tablet Chew 1 tablet by mouth daily as needed. For heartburn       . OVER THE COUNTER MEDICATION Place 5 drops into both ears once. Pt states that she used Hyland's ear drops for an ear ache.       [name] OVER THE COUNTER MEDICATION Place 5 drops into both ears once. Pt states that she used [employer] brand ear drops ofr ear wax.       . Prenatal Vit-Fe Fumarate-FA (PRENATAL MULTIVITAMIN) TABS Take 1 tablet by mouth daily.        . promethazine (PHENERGAN) 25 MG tablet Take 1 tablet (25 mg  total) by mouth every 6 (six) hours as needed for nausea. May take 1/2 tablet if needed for milder symptoms.  Sedation precautions.  30 tablet  0    Review of Systems - Negative except aforementioned symptoms  Physical Exam   Blood pressure 120/71, pulse 84, temperature 98.9 F (37.2 C), resp. rate 18, height 5\' 4"  (1.626 m), weight 72.666 kg (160 lb 3.2 oz), last menstrual period 08/27/2011, SpO2 96.00%.  General: General appearance - alert, well appearing, and in no distress Chest - clear to auscultation, no wheezes, rales or rhonchi, symmetric air entry Heart - normal rate and regular rhythm. Not c/o flutters at this time Abdomen - tenderness noted above umbilicus to light palpation.  Pt has diastasis recti with small amount of herniation with abdominal crunch.  Not tender to deep palpation.  Soft, no rebound tenderness noted; bowel sounds normal  Pelvic - examination not indicated Extremities - feet normal, good pulses, normal color, temperature and sensation   Labs: Recent Results (from the past 24 hour(s))  URINALYSIS, ROUTINE W REFLEX MICROSCOPIC   Collection Time   10/13/11 11:53 AM  Component Value Range   Color, Urine YELLOW  YELLOW    APPearance CLEAR  CLEAR    Specific Gravity, Urine 1.015  1.005 - 1.030    pH 7.0  5.0 - 8.0    Glucose, UA NEGATIVE  NEGATIVE (mg/dL)   Hgb urine dipstick SMALL (*) NEGATIVE    Bilirubin Urine NEGATIVE  NEGATIVE    Ketones, ur NEGATIVE  NEGATIVE (mg/dL)   Protein, ur NEGATIVE  NEGATIVE (mg/dL)   Urobilinogen, UA 0.2  0.0 - 1.0 (mg/dL)   Nitrite NEGATIVE  NEGATIVE    Leukocytes, UA NEGATIVE  NEGATIVE   URINE MICROSCOPIC-ADD ON   Collection Time   10/13/11 11:53 AM      Component Value Range   Squamous Epithelial / LPF RARE  RARE    RBC / HPF 0-2  <3 (RBC/hpf)   Bacteria, UA RARE  RARE   POCT PREGNANCY, URINE   Collection Time   10/13/11 11:55 AM      Component Value Range   Preg Test, Ur POSITIVE     Imaging Studies:  US Ob  Comp Less 14 Wks  09/27/2011  *RADIOLOGY REPORT*  Clinical Data: Pelvic pain  OBSTETRIC <14 WK ULTRASOUND,TRANSVAGINAL OB ULTRASOUND  Technique:  Transabdominal ultrasound was performed?for evaluation of?the gestation, as well as the maternal uterus and adnexal regions.,Technique:  Transvaginal ultrasound was performed for evaluation of the gestation as well as the maternal uterus and a  Comparison: None  Findings: Single intrauterine gestational sac identified.  No yolk sac or embryo at this time.  Mean sac diameter of 2.9 mm, with an estimated age of 4 weeks 5 days.  EDC of 05/31/2012.  Ovaries have a normal sonographic appearance.  There is a small amount of fluid within the endometrial cavity.  No free fluid within the pelvis.  IMPRESSION: Intrauterine pregnancy with estimated age of 4 weeks 5 days based on mean sac diameter. No yolk sac or embryo at this time. Recommend follow-up ultrasound to document progression.  Original Report Authenticated By: Waneta Martins, M.D.   US Ob Transvaginal  09/27/2011  *RADIOLOGY REPORT*  Clinical Data: Pelvic pain  OBSTETRIC <14 WK ULTRASOUND,TRANSVAGINAL OB ULTRASOUND  Technique:  Transabdominal ultrasound was performed?for evaluation of?the gestation, as well as the maternal uterus and adnexal regions.,Technique:  Transvaginal ultrasound was performed for evaluation of the gestation as well as the maternal uterus and a  Comparison: None  Findings: Single intrauterine gestational sac identified.  No yolk sac or embryo at this time.  Mean sac diameter of 2.9 mm, with an estimated age of 4 weeks 5 days.  EDC of 05/31/2012.  Ovaries have a normal sonographic appearance.  There is a small amount of fluid within the endometrial cavity.  No free fluid within the pelvis.  IMPRESSION: Intrauterine pregnancy with estimated age of 4 weeks 5 days based on mean sac diameter. No yolk sac or embryo at this time. Recommend follow-up ultrasound to document progression.  Original  Report Authenticated By: Waneta Martins, M.D.     Assessment: Infrequent probable PAC's d/t increased caffeine intake; diastasis recti  Plan: Try decaffeinated tea, abdominal splinting if necessary  Tiffany Hall,Tiffany Hall

## 2011-10-13 NOTE — Progress Notes (Addendum)
Pt reports having heart palpatation off and on for the past 2 days and having a sharp epigastric pain and shortness of breath as well. Called MD and told to come in.

## 2011-10-17 ENCOUNTER — Other Ambulatory Visit: Payer: Self-pay | Admitting: Obstetrics and Gynecology

## 2011-12-04 LAB — OB RESULTS CONSOLE RPR: RPR: NONREACTIVE

## 2011-12-04 LAB — OB RESULTS CONSOLE HEPATITIS B SURFACE ANTIGEN: Hepatitis B Surface Ag: NEGATIVE

## 2011-12-04 LAB — OB RESULTS CONSOLE ABO/RH

## 2011-12-04 LAB — OB RESULTS CONSOLE HIV ANTIBODY (ROUTINE TESTING): HIV: NONREACTIVE

## 2011-12-07 ENCOUNTER — Inpatient Hospital Stay (HOSPITAL_COMMUNITY)
Admission: AD | Admit: 2011-12-07 | Discharge: 2011-12-07 | Disposition: A | Discharge status: Home / Self Care | Payer: Medicare Other | Source: Ambulatory Visit | Attending: Obstetrics & Gynecology | Admitting: Obstetrics & Gynecology

## 2011-12-07 ENCOUNTER — Encounter (HOSPITAL_COMMUNITY): Payer: Self-pay | Admitting: *Deleted

## 2011-12-07 DIAGNOSIS — K529 Noninfective gastroenteritis and colitis, unspecified: Secondary | ICD-10-CM

## 2011-12-07 DIAGNOSIS — R197 Diarrhea, unspecified: Secondary | ICD-10-CM | POA: Insufficient documentation

## 2011-12-07 DIAGNOSIS — N949 Unspecified condition associated with female genital organs and menstrual cycle: Secondary | ICD-10-CM | POA: Insufficient documentation

## 2011-12-07 DIAGNOSIS — K5289 Other specified noninfective gastroenteritis and colitis: Secondary | ICD-10-CM

## 2011-12-07 HISTORY — DX: Anemia, unspecified: D64.9

## 2011-12-07 HISTORY — DX: Cardiac murmur, unspecified: R01.1

## 2011-12-07 LAB — URINALYSIS, ROUTINE W REFLEX MICROSCOPIC
Leukocytes, UA: NEGATIVE
Nitrite: NEGATIVE
Protein, ur: NEGATIVE mg/dL
Specific Gravity, Urine: <1.005 — ABNORMAL LOW (ref 1.005–1.030)
Urobilinogen, UA: 0.2 mg/dL (ref 0.0–1.0)

## 2011-12-07 LAB — URINE MICROSCOPIC-ADD ON

## 2011-12-07 NOTE — Progress Notes (Signed)
Diarrhea x1 this morning.  Still feels crampy.  Daughter had stomach flu last wk, no one else sick.

## 2011-12-07 NOTE — Progress Notes (Signed)
Had one bout of diarrhea this morning, followed by "mucous pouring out of vagina.  Then had some abd tightening and felt dizzy.  Called dr, instructed to come up here.

## 2011-12-07 NOTE — Discharge Instructions (Signed)
B.R.A.T. Diet    Your doctor has recommended the B.R.A.T. diet for you or your child until the condition improves. This is often used to help control diarrhea and vomiting symptoms. If you or your child can tolerate clear liquids, you may have:  · Bananas.   · Rice.   · Applesauce.   · Toast (and other simple starches such as crackers, potatoes, noodles).   Be sure to avoid dairy products, meats, and fatty foods until symptoms are better. Fruit juices such as apple, grape, and prune juice can make diarrhea worse. Avoid these. Continue this diet for 2 days or as instructed by your caregiver.  Document Released: 09/24/2005 Document Revised: 06/06/2011 Document Reviewed: 03/13/2007  [employer]® Patient Information ©2012 [employer].

## 2011-12-07 NOTE — ED Provider Notes (Signed)
History     Chief Complaint  Patient presents with  . Diarrhea  . Vaginal Discharge  . Dizziness   HPI  Diarrhea x1 this morning. Also reports pelvic cramping.  Denies UTI symptoms, vaginal bleeding or leaking of fluid.  Denies fever, body aches, or chills.  Daughter had stomach flu last wk, no one else sick.   Past Medical History  Diagnosis Date  . Migraine headache   . Ovarian cyst   . Sickle cell trait   . Gallstones 2012  . Abnormal Pap smear   . Chlamydia 2008  . Trichomonas 2008  . Gonorrhea 2008  . Heart murmur   . Anemia     has required blood transfusion  . Urinary tract infection     Past Surgical History  Procedure Date  . Colposcopy   . Dilation and curettage of uterus     required blood transfusion    Family History  Problem Relation Age of Onset  . Diabetes Mother   . Hypertension Mother   . Hepatitis Mother   . Cancer Mother   . Diabetes Maternal Grandmother   . Hypertension Maternal Grandmother   . Cancer Maternal Grandmother   . Anesthesia problems Neg Hx     History  Substance Use Topics  . Smoking status: Former Smoker    Types: Cigarettes  . Smokeless tobacco: Never Used   Comment: quit with preg  . Alcohol Use: No    Allergies: No Known Allergies  Prescriptions prior to admission  Medication Sig Dispense Refill  . calcium carbonate (TUMS - DOSED IN MG ELEMENTAL CALCIUM) 500 MG chewable tablet Chew 1 tablet by mouth daily as needed. For heartburn       . Prenatal Vit-Fe Fumarate-FA (PRENATAL MULTIVITAMIN) TABS Take 1 tablet by mouth daily.          Review of Systems  Constitutional: Negative for fever and chills.  Gastrointestinal: Positive for abdominal pain (crampy).  Genitourinary:       No vaginal bleeding   Physical Exam   Blood pressure 121/76, pulse 103, temperature 98.2 F (36.8 C), temperature source Oral, resp. rate 20, height 5' 5.5" (1.664 m), weight 72.122 kg (159 lb), last menstrual period 08/27/2011, SpO2  100.00%.  Physical Exam  Constitutional: She is oriented to person, place, and time. She appears well-developed and well-nourished. No distress.  HENT:  Head: Normocephalic.  Neck: Normal range of motion. Neck supple.  Cardiovascular: Normal rate, regular rhythm and normal heart sounds.   Respiratory: Effort normal and breath sounds normal.  GI: Soft. There is no tenderness.  Genitourinary: No bleeding around the vagina. Vaginal discharge (mucusy) found.       Cervix - long, closed, thick; no clear fluid seen.  Neurological: She is alert and oriented to person, place, and time.  Skin: Skin is warm and dry.    MAU Course  Procedures  Results for orders placed during the hospital encounter of 12/07/11 (from the past 24 hour(s))  URINALYSIS, ROUTINE W REFLEX MICROSCOPIC     Status: Abnormal   Collection Time   12/07/11 12:35 PM      Component Value Range   Color, Urine YELLOW  YELLOW    APPearance CLEAR  CLEAR    Specific Gravity, Urine <1.005 (*) 1.005 - 1.030    pH 6.5  5.0 - 8.0    Glucose, UA NEGATIVE  NEGATIVE (mg/dL)   Hgb urine dipstick TRACE (*) NEGATIVE    Bilirubin Urine NEGATIVE  NEGATIVE  Ketones, ur NEGATIVE  NEGATIVE (mg/dL)   Protein, ur NEGATIVE  NEGATIVE (mg/dL)   Urobilinogen, UA 0.2  0.0 - 1.0 (mg/dL)   Nitrite NEGATIVE  NEGATIVE    Leukocytes, UA NEGATIVE  NEGATIVE   URINE MICROSCOPIC-ADD ON     Status: Abnormal   Collection Time   12/07/11 12:35 PM      Component Value Range   Squamous Epithelial / LPF FEW (*) RARE    WBC, UA 0-2  <3 (WBC/hpf)   RBC / HPF 0-2  <3 (RBC/hpf)     Assessment and Plan  Gastroenteritis  Plan: DC to home Provided reassurance Increase fluids Return for vaginal bleeding, contractions, leaking of fluid or any other warning signs of pregnancy.  [facility] 12/07/2011, 2:32 PM

## 2011-12-16 ENCOUNTER — Encounter (HOSPITAL_COMMUNITY): Payer: Self-pay | Admitting: *Deleted

## 2011-12-16 ENCOUNTER — Inpatient Hospital Stay (HOSPITAL_COMMUNITY)
Admission: AD | Admit: 2011-12-16 | Discharge: 2011-12-16 | Disposition: A | Discharge status: Home / Self Care | Payer: Medicare Other | Source: Ambulatory Visit | Attending: Obstetrics | Admitting: Obstetrics

## 2011-12-16 DIAGNOSIS — N949 Unspecified condition associated with female genital organs and menstrual cycle: Secondary | ICD-10-CM

## 2011-12-16 DIAGNOSIS — O99891 Other specified diseases and conditions complicating pregnancy: Secondary | ICD-10-CM | POA: Insufficient documentation

## 2011-12-16 DIAGNOSIS — R109 Unspecified abdominal pain: Secondary | ICD-10-CM | POA: Insufficient documentation

## 2011-12-16 LAB — URINALYSIS, ROUTINE W REFLEX MICROSCOPIC
Bilirubin Urine: NEGATIVE
Glucose, UA: NEGATIVE mg/dL
Ketones, ur: NEGATIVE mg/dL
Protein, ur: NEGATIVE mg/dL
Urobilinogen, UA: 1 mg/dL (ref 0.0–1.0)

## 2011-12-16 LAB — WET PREP, GENITAL
Trich, Wet Prep: NONE SEEN
Yeast Wet Prep HPF POC: NONE SEEN

## 2011-12-16 LAB — URINE MICROSCOPIC-ADD ON

## 2011-12-16 NOTE — Progress Notes (Signed)
Pt reports having  Constant pelvic pressure and pain that started yesterday. Denies vaginal bleeding or discharge. Positive fetal movement reported.

## 2011-12-16 NOTE — Discharge Instructions (Signed)
YOUR URINE WAS NORMAL AND YOUR WET PREP WAS NORMAL. TAKE TYLENOL AS NEEDED FOR DISCOMFORT. FOLLOW UP IN THE OFFICE. RETURN HERE AS NEEDED.

## 2011-12-16 NOTE — ED Provider Notes (Signed)
History     CSN: [id number]  Arrival date & time 12/16/11  1325   None     Chief Complaint  Patient presents with  . Pelvic Pain    HPI Tiffany Hall is a 26 y.o. female @ [redacted]w[redacted]d gestation who presents to MAU for lower abdominal pressure that started yesterday. The pain is worse with walking and movement. Denies UTI symptoms. Denies nausea, vomiting or diarrhea. Denies vaginal bleeding or unusual discharge. Current sex partner x 4 years. History of GC, Chlamydia at age 36 but none since then. Last pap semear one year ago and was normal.  Past Medical History  Diagnosis Date  . Migraine headache   . Ovarian cyst   . Sickle cell trait   . Gallstones 2012  . Abnormal Pap smear   . Chlamydia 2008  . Trichomonas 2008  . Gonorrhea 2008  . Heart murmur   . Anemia     has required blood transfusion  . Urinary tract infection     Past Surgical History  Procedure Date  . Colposcopy   . Dilation and curettage of uterus     required blood transfusion    Family History  Problem Relation Age of Onset  . Diabetes Mother   . Hypertension Mother   . Hepatitis Mother   . Cancer Mother   . Diabetes Maternal Grandmother   . Hypertension Maternal Grandmother   . Cancer Maternal Grandmother   . Anesthesia problems Neg Hx     History  Substance Use Topics  . Smoking status: Former Smoker    Types: Cigarettes  . Smokeless tobacco: Never Used   Comment: quit with preg  . Alcohol Use: No    OB History    Grav Para Term Preterm Abortions TAB SAB Ect Mult Living   5 3 3  1  1   3       Review of Systems  Constitutional: Negative for fever, chills, diaphoresis and fatigue.  HENT: Negative for ear pain, congestion, sore throat, facial swelling, neck pain, neck stiffness, dental problem and sinus pressure.   Eyes: Negative for photophobia, pain and discharge.  Respiratory: Negative for cough, chest tightness and wheezing.   Cardiovascular: Negative.        Heart murmer     Gastrointestinal: Positive for abdominal pain (pressure). Negative for nausea, vomiting, diarrhea, constipation and abdominal distention.  Genitourinary: Positive for pelvic pain (pressure). Negative for dysuria, urgency, frequency, flank pain, vaginal bleeding, vaginal discharge, difficulty urinating and vaginal pain.  Musculoskeletal: Negative for myalgias, back pain and gait problem.  Skin: Negative for color change and rash.  Neurological: Negative for dizziness, speech difficulty, weakness, light-headedness, numbness and headaches.  Psychiatric/Behavioral: Negative for confusion and agitation. The patient is not nervous/anxious.     Allergies  Review of patient's allergies indicates no known allergies.  Home Medications  No current outpatient prescriptions on file.  BP 100/60  Pulse 89  Temp(Src) 99.4 F (37.4 C) (Oral)  Resp 18  Ht 5\' 4"  (1.626 m)  Wt 158 lb 6.4 oz (71.85 kg)  BMI 27.19 kg/m2  LMP 08/27/2011  Physical Exam  Nursing note and vitals reviewed. Constitutional: She is oriented to person, place, and time. She appears well-developed and well-nourished.  HENT:  Head: Normocephalic.  Eyes: EOM are normal.  Neck: Neck supple.  Cardiovascular: Normal rate.   Pulmonary/Chest: Effort normal.  Abdominal: Soft. There is no tenderness.       Unable to reproduce the pain patient  states she has with walking and movement. + FHT  Genitourinary:       External genitalia without lesions. White discharge vaginal vault. Cervix closed, no CMT, no adnexal tenderness, uterus consistent with dates.   Musculoskeletal: Normal range of motion.  Neurological: She is alert and oriented to person, place, and time. No cranial nerve deficit.  Skin: Skin is warm and dry.  Psychiatric: She has a normal mood and affect. Her behavior is normal. Judgment and thought content normal.   Results for orders placed during the hospital encounter of 12/16/11 (from the past 24 hour(s))  URINALYSIS,  ROUTINE W REFLEX MICROSCOPIC     Status: Abnormal   Collection Time   12/16/11  1:45 PM      Component Value Range   Color, Urine YELLOW  YELLOW    APPearance HAZY (*) CLEAR    Specific Gravity, Urine <1.005 (*) 1.005 - 1.030    pH 6.5  5.0 - 8.0    Glucose, UA NEGATIVE  NEGATIVE (mg/dL)   Hgb urine dipstick TRACE (*) NEGATIVE    Bilirubin Urine NEGATIVE  NEGATIVE    Ketones, ur NEGATIVE  NEGATIVE (mg/dL)   Protein, ur NEGATIVE  NEGATIVE (mg/dL)   Urobilinogen, UA 1.0  0.0 - 1.0 (mg/dL)   Nitrite NEGATIVE  NEGATIVE    Leukocytes, UA NEGATIVE  NEGATIVE   URINE MICROSCOPIC-ADD ON     Status: Abnormal   Collection Time   12/16/11  1:45 PM      Component Value Range   Squamous Epithelial / LPF FEW (*) RARE    WBC, UA 0-2  <3 (WBC/hpf)   RBC / HPF 0-2  <3 (RBC/hpf)   Bacteria, UA RARE  RARE   WET PREP, GENITAL     Status: Abnormal   Collection Time   12/16/11  1:55 PM      Component Value Range   Yeast Wet Prep HPF POC NONE SEEN  NONE SEEN    Trich, Wet Prep NONE SEEN  NONE SEEN    Clue Cells Wet Prep HPF POC FEW (*) NONE SEEN    WBC, Wet Prep HPF POC FEW (*) NONE SEEN     Assessment: Round ligament pain  Plan:  Discussed with patient lab results and round ligament pain   Follow up in the office   Tylenol as needed for pain   Return as needed. ED Course  Procedures  MDM          Janne Napoleon, NP 12/16/11 1434

## 2011-12-17 LAB — GC/CHLAMYDIA PROBE AMP, GENITAL
Chlamydia, DNA Probe: NEGATIVE
GC Probe Amp, Genital: NEGATIVE

## 2012-01-18 ENCOUNTER — Inpatient Hospital Stay (HOSPITAL_COMMUNITY)
Admission: AD | Admit: 2012-01-18 | Discharge: 2012-01-18 | Disposition: A | Discharge status: Home / Self Care | Payer: Medicare Other | Source: Ambulatory Visit | Attending: Obstetrics | Admitting: Obstetrics

## 2012-01-18 ENCOUNTER — Encounter (HOSPITAL_COMMUNITY): Payer: Self-pay | Admitting: *Deleted

## 2012-01-18 DIAGNOSIS — O99891 Other specified diseases and conditions complicating pregnancy: Secondary | ICD-10-CM | POA: Insufficient documentation

## 2012-01-18 DIAGNOSIS — N949 Unspecified condition associated with female genital organs and menstrual cycle: Secondary | ICD-10-CM | POA: Insufficient documentation

## 2012-01-18 DIAGNOSIS — O26899 Other specified pregnancy related conditions, unspecified trimester: Secondary | ICD-10-CM

## 2012-01-18 LAB — WET PREP, GENITAL
Clue Cells Wet Prep HPF POC: NONE SEEN
Yeast Wet Prep HPF POC: NONE SEEN

## 2012-01-18 LAB — URINALYSIS, ROUTINE W REFLEX MICROSCOPIC
Bilirubin Urine: NEGATIVE
Glucose, UA: NEGATIVE mg/dL
Protein, ur: NEGATIVE mg/dL

## 2012-01-18 LAB — URINE MICROSCOPIC-ADD ON

## 2012-01-18 MED ORDER — CYCLOBENZAPRINE HCL 10 MG PO TABS: 10.0000 mg | ORAL_TABLET | Freq: Once | ORAL | Status: DC

## 2012-01-18 MED ORDER — CYCLOBENZAPRINE HCL 10 MG PO TABS
10.0000 mg | ORAL_TABLET | Freq: Once | ORAL | Status: AC
  Administered 2012-01-18: 10 mg via ORAL
  Filled 2012-01-18: qty 1

## 2012-01-18 MED ORDER — CYCLOBENZAPRINE HCL 10 MG PO TABS: 10.0000 mg | ORAL_TABLET | Freq: Two times a day (BID) | ORAL | Status: AC | PRN

## 2012-01-18 MED ORDER — OXYCODONE-ACETAMINOPHEN 5-325 MG PO TABS
2.0000 | ORAL_TABLET | Freq: Once | ORAL | Status: AC
  Administered 2012-01-18: 1 via ORAL
  Filled 2012-01-18: qty 2

## 2012-01-18 MED ORDER — OXYCODONE-ACETAMINOPHEN 5-325 MG PO TABS: 2.0000 | ORAL_TABLET | ORAL | Status: AC | PRN

## 2012-01-18 MED ORDER — KETOROLAC TROMETHAMINE 30 MG/ML IJ SOLN: 30.0000 mg | Freq: Once | INTRAMUSCULAR | Status: DC

## 2012-01-18 MED ORDER — ACETAMINOPHEN-CODEINE #3 300-30 MG PO TABS: 1.0000 | ORAL_TABLET | Freq: Once | ORAL | Status: DC

## 2012-01-18 NOTE — MAU Provider Note (Signed)
History     CSN: [id number]  Arrival date and time: 01/18/12 1020   First Provider Initiated Contact with Patient 01/18/12 1131      No chief complaint on file.  HPI Pt is G5P3 [redacted] weeks pregnant and presents with pelvic pain and possible separation of pubic symphasis .  The pain is worse when she walks and is difficult for pt to get comfortable at night to sleep.  She has tried tylenol without relief.  She denies pain with urination, constipation or diarrhea.  She denies contractions.    Past Medical History  Diagnosis Date  . Migraine headache   . Ovarian cyst   . Sickle cell trait   . Gallstones 2012  . Abnormal Pap smear   . Chlamydia 2008  . Trichomonas 2008  . Gonorrhea 2008  . Heart murmur   . Anemia     has required blood transfusion  . Urinary tract infection     Past Surgical History  Procedure Date  . Colposcopy   . Dilation and curettage of uterus     required blood transfusion    Family History  Problem Relation Age of Onset  . Diabetes Mother   . Hypertension Mother   . Hepatitis Mother   . Cancer Mother   . Diabetes Maternal Grandmother   . Hypertension Maternal Grandmother   . Cancer Maternal Grandmother   . Anesthesia problems Neg Hx     History  Substance Use Topics  . Smoking status: Former Smoker    Types: Cigarettes  . Smokeless tobacco: Never Used   Comment: quit with preg  . Alcohol Use: No    Allergies: No Known Allergies  Prescriptions prior to admission  Medication Sig Dispense Refill  . acetaminophen (TYLENOL) 325 MG tablet Take 650 mg by mouth every 6 (six) hours as needed. For pain      . calcium carbonate (TUMS - DOSED IN MG ELEMENTAL CALCIUM) 500 MG chewable tablet Chew 1 tablet by mouth daily as needed. For heartburn       . Prenatal Vit-Fe Fumarate-FA (PRENATAL MULTIVITAMIN) TABS Take 1 tablet by mouth every evening.         Review of Systems  Constitutional: Negative for fever and chills.  Gastrointestinal:  Positive for abdominal pain. Negative for nausea, vomiting, diarrhea and constipation.  Genitourinary: Negative for dysuria, urgency and frequency.  Musculoskeletal: Positive for joint pain.  Neurological: Negative for headaches.   Physical Exam   Blood pressure 108/67, pulse 87, temperature 98.1 F (36.7 C), temperature source Oral, resp. rate 16, height 5\' 4"  (1.626 m), weight 163 lb (73.936 kg), last menstrual period 08/27/2011.  Physical Exam  Nursing note and vitals reviewed. Constitutional: She is oriented to person, place, and time. She appears well-developed and well-nourished.  HENT:  Head: Normocephalic.  Eyes: Pupils are equal, round, and reactive to light.  Neck: Normal range of motion. Neck supple.  Cardiovascular: Normal rate.   Respiratory: Effort normal.  GI: Soft. There is tenderness. There is no rebound and no guarding.       FHR positive; no ctx noted  Genitourinary: Vagina normal and uterus normal.       Pt tender at her pubic symphasis  Musculoskeletal: Normal range of motion.  Neurological: She is alert and oriented to person, place, and time.  Skin: Skin is warm and dry.  Psychiatric: She has a normal mood and affect.    MAU Course  Procedures Results for orders placed during the  hospital encounter of 01/18/12 (from the past 24 hour(s))  URINALYSIS, ROUTINE W REFLEX MICROSCOPIC     Status: Abnormal   Collection Time   01/18/12 10:26 AM      Component Value Range   Color, Urine YELLOW  YELLOW    APPearance CLEAR  CLEAR    Specific Gravity, Urine 1.020  1.005 - 1.030    pH 6.5  5.0 - 8.0    Glucose, UA NEGATIVE  NEGATIVE (mg/dL)   Hgb urine dipstick SMALL (*) NEGATIVE    Bilirubin Urine NEGATIVE  NEGATIVE    Ketones, ur NEGATIVE  NEGATIVE (mg/dL)   Protein, ur NEGATIVE  NEGATIVE (mg/dL)   Urobilinogen, UA 1.0  0.0 - 1.0 (mg/dL)   Nitrite NEGATIVE  NEGATIVE    Leukocytes, UA NEGATIVE  NEGATIVE   URINE MICROSCOPIC-ADD ON     Status: Abnormal    Collection Time   01/18/12 10:26 AM      Component Value Range   Squamous Epithelial / LPF MANY (*) RARE    RBC / HPF 3-6  <3 (RBC/hpf)   Urine-Other MUCOUS PRESENT    WET PREP, GENITAL     Status: Abnormal   Collection Time   01/18/12 11:30 AM      Component Value Range   Yeast Wet Prep HPF POC NONE SEEN  NONE SEEN    Trich, Wet Prep NONE SEEN  NONE SEEN    Clue Cells Wet Prep HPF POC NONE SEEN  NONE SEEN    WBC, Wet Prep HPF POC FEW (*) NONE SEEN   Percocet and flexeril given for pain Discussed recommendations for possible separation of pubic symphasis Pain management and try PP belly band for pelvic support  Assessment and Plan  Pelvic pain ?separation of pubic symphasis Rx for Percocet and Flexeril F/u with Dr. Clearance Coots if pain increases Keep OB appointment with Dr. Clearance Coots Pelvic rest this weekend  [facility] 01/18/2012, 11:32 AM

## 2012-01-18 NOTE — OB Triage Note (Signed)
C/o vaginal pain 10/10 x 2 weeks; called office, Dr. Gaynell Face instructed her to come to MAU to be evaluated - E. Linh Hedberg [facility] 01/18/12 @ 1035

## 2012-01-19 LAB — GC/CHLAMYDIA PROBE AMP, GENITAL
Chlamydia, DNA Probe: NEGATIVE
GC Probe Amp, Genital: NEGATIVE

## 2012-03-01 ENCOUNTER — Inpatient Hospital Stay (HOSPITAL_COMMUNITY)
Admission: AD | Admit: 2012-03-01 | Discharge: 2012-03-01 | Disposition: A | Discharge status: Home / Self Care | Payer: Medicare Other | Source: Ambulatory Visit | Attending: Obstetrics & Gynecology | Admitting: Obstetrics & Gynecology

## 2012-03-01 ENCOUNTER — Encounter (HOSPITAL_COMMUNITY): Payer: Self-pay | Admitting: Obstetrics and Gynecology

## 2012-03-01 DIAGNOSIS — O99891 Other specified diseases and conditions complicating pregnancy: Secondary | ICD-10-CM | POA: Insufficient documentation

## 2012-03-01 DIAGNOSIS — IMO0002 Reserved for concepts with insufficient information to code with codable children: Secondary | ICD-10-CM | POA: Insufficient documentation

## 2012-03-01 DIAGNOSIS — R42 Dizziness and giddiness: Secondary | ICD-10-CM | POA: Insufficient documentation

## 2012-03-01 DIAGNOSIS — R109 Unspecified abdominal pain: Secondary | ICD-10-CM | POA: Insufficient documentation

## 2012-03-01 DIAGNOSIS — D649 Anemia, unspecified: Secondary | ICD-10-CM

## 2012-03-01 DIAGNOSIS — O99019 Anemia complicating pregnancy, unspecified trimester: Secondary | ICD-10-CM

## 2012-03-01 DIAGNOSIS — S3991XA Unspecified injury of abdomen, initial encounter: Secondary | ICD-10-CM

## 2012-03-01 LAB — CBC
HCT: 27.3 % — ABNORMAL LOW (ref 36.0–46.0)
MCHC: 34.4 g/dL (ref 30.0–36.0)
MCV: 77.3 fL — ABNORMAL LOW (ref 78.0–100.0)
Platelets: 294 10*3/uL (ref 150–400)
RDW: 14.9 % (ref 11.5–15.5)

## 2012-03-01 LAB — COMPREHENSIVE METABOLIC PANEL
Albumin: 2.7 g/dL — ABNORMAL LOW (ref 3.5–5.2)
BUN: 5 mg/dL — ABNORMAL LOW (ref 6–23)
Calcium: 9.1 mg/dL (ref 8.4–10.5)
Creatinine, Ser: 0.45 mg/dL — ABNORMAL LOW (ref 0.50–1.10)
Total Protein: 6.4 g/dL (ref 6.0–8.3)

## 2012-03-01 LAB — URINALYSIS, ROUTINE W REFLEX MICROSCOPIC
Glucose, UA: NEGATIVE mg/dL
Hgb urine dipstick: NEGATIVE
Ketones, ur: NEGATIVE mg/dL
Protein, ur: NEGATIVE mg/dL
Urobilinogen, UA: 1 mg/dL (ref 0.0–1.0)

## 2012-03-01 MED ORDER — INTEGRA F 125-1 MG PO CAPS: 1.0000 | ORAL_CAPSULE | Freq: Every day | ORAL | Status: DC

## 2012-03-01 NOTE — ED Notes (Signed)
Mr. Tiffany Hall notified of prescription that was sent to [facility]. Pt aware and voices understanding to pick medication up
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
## 2012-03-01 NOTE — Discharge Instructions (Signed)
Blunt Abdominal Trauma A blunt injury to the abdomen can cause pain. The pain is most likely from bruising and stretching of your muscles. This pain is often made worse with movement. Most often these injuries are not serious and get better within 1 week with rest and mild pain medicine. However, internal organs (liver, spleen, kidneys) can be injured with blunt trauma. If you do not get better or if you get worse, further examination may be needed. Continue with your regular daily activities, but avoid any strenuous activities until your pain is improved. If your stomach is upset, stick to a clear liquid diet and slowly advance to solid food.  SEEK IMMEDIATE MEDICAL CARE IF:   You develop increasing pain, nausea, or repeated vomiting.   You develop chest pain or breathing difficulty.   You develop blood in the urine, vomit, or stool.   You develop weakness, fainting, fever, or other serious complaints.  Document Released: 11/01/2004 Document Revised: 09/13/2011 Document Reviewed: 02/17/2009 [facility] Patient Information 2012 [place], [place].Anemia, Frequently Asked Questions WHAT ARE THE SYMPTOMS OF ANEMIA?  Headache.   Difficulty thinking.   Fatigue.   Shortness of breath.   Weakness.   Rapid heartbeat.  AT WHAT POINT ARE PEOPLE CONSIDERED ANEMIC?  This varies with gender and age.   Both hemoglobin (Hgb) and hematocrit values are used to define anemia. These lab values are obtained from a complete blood count (CBC) test. This is performed at a caregiver's office.   The normal range of hemoglobin values for adult men is 14.0 g/dL to 56.2 g/dL. For nonpregnant women, values are 12.3 g/dL to 13.0 g/dL.   The World Health Organization defines anemia as less than 12 g/dL for nonpregnant women and less than 13 g/dL for men.   For adult males, the average normal hematocrit is 46%, and the range is 40% to 52%.   For adult females, the average normal hematocrit is 41%, and the range is  35% to 47%.   Values that fall below the lower limits can be a sign of anemia and should have further checking (evaluation).  GROUPS OF PEOPLE WHO ARE AT RISK FOR DEVELOPING ANEMIA INCLUDE:   Infants who are breastfed or taking a formula that is not fortified with iron.   Children going through a rapid growth spurt. The iron available can not keep up with the needs for a red cell mass which must grow with the child.   Women in childbearing years. They need iron because of blood loss during menstruation.   Pregnant women. The growing fetus creates a high demand for iron.   People with ongoing gastrointestinal blood loss are at risk of developing iron deficiency.   Individuals with leukemia or cancer who must receive chemotherapy or radiation to treat their disease. The drugs or radiation used to treat these diseases often decreases the bone marrow's ability to make cells of all classes. This includes red blood cells, white blood cells, and platelets.   Individuals with chronic inflammatory conditions such as rheumatoid arthritis or chronic infections.   The elderly.  ARE SOME TYPES OF ANEMIA INHERITED?   Yes, some types of anemia are due to inherited or genetic defects.   Sickle cell anemia. This occurs most often in people of African, African American, and Mediterranean descent.   Thalassemia (or Cooley's anemia). This type is found in people of Mediterranean and Southeast Asian descent. These types of anemia are common.   [place]. This is rare.  CAN CERTAIN MEDICATIONS  CAUSE A PERSON TO BECOME ANEMIC?  Yes. For example, drugs to fight cancer (chemotherapeutic agents) often cause anemia. These drugs can slow the bone marrow's ability to make red blood cells. If there are not enough red blood cells, the body does not get enough oxygen. WHAT HEMATOCRIT LEVEL IS REQUIRED TO DONATE BLOOD?  The lower limit of an acceptable hematocrit for blood donors is 38%. If you have a low hematocrit  value, you should schedule an appointment with your caregiver. ARE BLOOD TRANSFUSIONS COMMONLY USED TO CORRECT ANEMIA, AND ARE THEY DANGEROUS?  They are used to treat anemia as a last resort. Your caregiver will find the cause of the anemia and correct it if possible. Most blood transfusions are given because of excessive bleeding at the time of surgery, with trauma, or because of bone marrow suppression in patients with cancer or leukemia on chemotherapy. Blood transfusions are safer than ever before. We also know that blood transfusions affect the immune system and may increase certain risks. There is also a concern for human error. In 1/16,000 transfusions, a patient receives a transfusion of blood that is not matched with his or her blood type.  WHAT IS IRON DEFICIENCY ANEMIA AND CAN I CORRECT IT BY CHANGING MY DIET?  Iron is an essential part of hemoglobin. Without enough hemoglobin, anemia develops and the body does not get the right amount of oxygen. Iron deficiency anemia develops after the body has had a low level of iron for a long time. This is either caused by blood loss, not taking in or absorbing enough iron, or increased demands for iron (like pregnancy or rapid growth).  Foods from animal origin such as beef, chicken, and pork, are good sources of iron. Be sure to have one of these foods at each meal. Vitamin C helps your body absorb iron. Foods rich in Vitamin C include citrus, bell pepper, strawberries, spinach and cantaloupe. In some cases, iron supplements may be needed in order to correct the iron deficiency. In the case of poor absorption, extra iron may have to be given directly into the vein through a needle (intravenously). I HAVE BEEN DIAGNOSED WITH IRON DEFICIENCY ANEMIA AND MY CAREGIVER PRESCRIBED IRON SUPPLEMENTS. HOW LONG WILL IT TAKE FOR MY BLOOD TO BECOME NORMAL?  It depends on the degree of anemia at the beginning of treatment. Most people with mild to moderate iron  deficiency, anemia will correct the anemia over a period of 2 to 3 months. But after the anemia is corrected, the iron stored by the body is still low. Caregivers often suggest an additional 6 months of oral iron therapy once the anemia has been reversed. This will help prevent the iron deficiency anemia from quickly happening again. Non-anemic adult males should take iron supplements only under the direction of a doctor, too much iron can cause liver damage.  MY HEMOGLOBIN IS 9 G/DL AND I AM SCHEDULED FOR SURGERY. SHOULD I POSTPONE THE SURGERY?  If you have Hgb of 9, you should discuss this with your caregiver right away. Many patients with similar hemoglobin levels have had surgery without problems. If minimal blood loss is expected for a minor procedure, no treatment may be necessary.  If a greater blood loss is expected for more extensive procedures, you should ask your caregiver about being treated with erythropoietin and iron. This is to accelerate the recovery of your hemoglobin to a normal level before surgery. An anemic patient who undergoes high-blood-loss surgery has a greater risk of  surgical complications and need for a blood transfusion, which also carries some risk.  I HAVE BEEN TOLD THAT HEAVY MENSTRUAL PERIODS CAUSE ANEMIA. IS THERE ANYTHING I CAN DO TO PREVENT THE ANEMIA?  Anemia that results from heavy periods is usually due to iron deficiency. You can try to meet the increased demands for iron caused by the heavy monthly blood loss by increasing the intake of iron-rich foods. Iron supplements may be required. Discuss your concerns with your caregiver. WHAT CAUSES ANEMIA DURING PREGNANCY?  Pregnancy places major demands on the body. The mother must meet the needs of both her body and her growing baby. The body needs enough iron and folate to make the right amount of red blood cells. To prevent anemia while pregnant, the mother should stay in close contact with her caregiver.  Be sure to  eat a diet that has foods rich in iron and folate like liver and dark green leafy vegetables. Folate plays an important role in the normal development of a baby's spinal cord. Folate can help prevent serious disorders like spina bifida. If your diet does not provide adequate nutrients, you may want to talk with your caregiver about nutritional supplements.  WHAT IS THE RELATIONSHIP BETWEEN FIBROID TUMORS AND ANEMIA IN WOMEN?  The relationship is usually caused by the increased menstrual blood loss caused by fibroids. Good iron intake may be required to prevent iron deficiency anemia from developing.  Document Released: 05/02/2004 Document Revised: 09/13/2011 Document Reviewed: 10/17/2010 [facility] Patient Information 2012 [place], [place].

## 2012-03-22 ENCOUNTER — Inpatient Hospital Stay (HOSPITAL_COMMUNITY)
Admission: AD | Admit: 2012-03-22 | Discharge: 2012-03-22 | Disposition: A | Discharge status: Home / Self Care | Payer: Medicare Other | Source: Ambulatory Visit | Attending: Obstetrics & Gynecology | Admitting: Obstetrics & Gynecology

## 2012-03-22 ENCOUNTER — Encounter (HOSPITAL_COMMUNITY): Payer: Self-pay | Admitting: *Deleted

## 2012-03-22 DIAGNOSIS — R51 Headache: Secondary | ICD-10-CM | POA: Insufficient documentation

## 2012-03-22 DIAGNOSIS — B3731 Acute candidiasis of vulva and vagina: Secondary | ICD-10-CM | POA: Insufficient documentation

## 2012-03-22 DIAGNOSIS — O26899 Other specified pregnancy related conditions, unspecified trimester: Secondary | ICD-10-CM

## 2012-03-22 DIAGNOSIS — B373 Candidiasis of vulva and vagina: Secondary | ICD-10-CM | POA: Insufficient documentation

## 2012-03-22 DIAGNOSIS — B379 Candidiasis, unspecified: Secondary | ICD-10-CM

## 2012-03-22 LAB — URINALYSIS, ROUTINE W REFLEX MICROSCOPIC
Glucose, UA: NEGATIVE mg/dL
Leukocytes, UA: NEGATIVE
Protein, ur: NEGATIVE mg/dL
Specific Gravity, Urine: >1.03 — ABNORMAL HIGH (ref 1.005–1.030)
pH: 6 (ref 5.0–8.0)

## 2012-03-22 LAB — WET PREP, GENITAL: Trich, Wet Prep: NONE SEEN

## 2012-03-22 MED ORDER — ACETAMINOPHEN 325 MG PO TABS
650.0000 mg | ORAL_TABLET | Freq: Once | ORAL | Status: AC
  Administered 2012-03-22: 650 mg via ORAL
  Filled 2012-03-22: qty 2

## 2012-03-22 MED ORDER — FLUCONAZOLE 150 MG PO TABS
150.0000 mg | ORAL_TABLET | Freq: Once | ORAL | Status: AC
  Administered 2012-03-22: 150 mg via ORAL
  Filled 2012-03-22: qty 1

## 2012-03-22 NOTE — MAU Provider Note (Signed)
History     CSN: [id number]  Arrival date & time 03/22/12  1703   None     Chief Complaint  Patient presents with  . Headache  . Vaginal Discharge    HPI Tiffany Hall is a 26 y.o. female @ [redacted]w[redacted]d gestation who presents to MAU for headache that started 4 days ago. The headache is located in the right temporal area and radiates to the right ear. History of migraines. She describes the pain as a pressure feeling.  Took 350 mg of tylenol without relief. Last took tylenol early this morning. Also thinks she is leaking fluid since yesterday. The history was provided by the patient.  Past Medical History  Diagnosis Date  . Migraine headache   . Ovarian cyst   . Sickle cell trait   . Gallstones 2012  . Abnormal Pap smear   . Chlamydia 2008  . Trichomonas 2008  . Gonorrhea 2008  . Anemia     has required blood transfusion  . Urinary tract infection   . Heart murmur     as child  . Abnormal pap 2009    Colpo - unknown results  . Gallstones     Past Surgical History  Procedure Date  . Colposcopy   . Dilation and curettage of uterus     required blood transfusion    Family History  Problem Relation Age of Onset  . Diabetes Mother   . Hypertension Mother   . Hepatitis Mother   . Cancer Mother 35    Breast, cervical  . Diabetes Maternal Grandmother   . Hypertension Maternal Grandmother   . Cancer Maternal Grandmother 40    Breast  . Anesthesia problems Neg Hx     History  Substance Use Topics  . Smoking status: Former Smoker    Types: Cigarettes  . Smokeless tobacco: Never Used   Comment: quit with preg  . Alcohol Use: No    OB History    Grav Para Term Preterm Abortions TAB SAB Ect Mult Living   5 3 3  1  1   3       Review of Systems  Constitutional: Negative for fever, chills, diaphoresis and fatigue.  HENT: Positive for ear pain. Negative for congestion, sore throat, facial swelling, neck pain, neck stiffness, dental problem, sinus pressure and ear  discharge.   Eyes: Positive for visual disturbance. Negative for photophobia, pain and discharge.  Respiratory: Negative for cough, chest tightness, shortness of breath and wheezing.   Cardiovascular: Negative for chest pain, palpitations and leg swelling.  Gastrointestinal: Positive for abdominal pain. Negative for nausea, vomiting, diarrhea, constipation and abdominal distention.  Genitourinary: Positive for vaginal discharge and pelvic pain. Negative for dysuria, frequency, flank pain, vaginal bleeding and difficulty urinating.  Musculoskeletal: Negative for myalgias, back pain and gait problem.  Skin: Negative for color change and rash.  Neurological: Positive for dizziness, light-headedness and headaches. Negative for speech difficulty, weakness and numbness.  Psychiatric/Behavioral: Negative for confusion and agitation. The patient is not nervous/anxious.     Allergies  Stadol  Home Medications  No current outpatient prescriptions on file.  BP 104/60  Pulse 89  Temp 98.2 F (36.8 C) (Oral)  Resp 18  LMP 08/27/2011  Physical Exam  Nursing note and vitals reviewed. Constitutional: She is oriented to person, place, and time. She appears well-developed and well-nourished. No distress.  HENT:  Head: Normocephalic.  Right Ear: External ear and ear canal normal.  Left Ear: External ear  and ear canal normal.       Unable to visualize left TM due to cerumen. Right TM normal.  Eyes: EOM are normal.  Neck: Neck supple.  Cardiovascular: Normal rate.   Pulmonary/Chest: Effort normal.  Abdominal: Soft. There is no tenderness.  Genitourinary:       External genitalia without lesions. Thick white discharge vaginal vault. Cervix finger-tip, thick and high. Uterus consistent with dates. No pain on exam.  Musculoskeletal: Normal range of motion.  Neurological: She is alert and oriented to person, place, and time. No cranial nerve deficit.  Skin: Skin is warm and dry.  Psychiatric: She  has a normal mood and affect. Her behavior is normal. Judgment and thought content normal.   Slide for fern is negative.  Consult with Dr. Tamela Oddi. Will send amnisure, GC, Chlamydia and wet prep.   ED Course: 19:15 pm patient is feeling better after 650 mg of tylenol. Continue to await lab results.   Procedures Results for orders placed during the hospital encounter of 03/22/12 (from the past 24 hour(s))  URINALYSIS, ROUTINE W REFLEX MICROSCOPIC     Status: Abnormal   Collection Time   03/22/12  5:04 PM      Component Value Range   Color, Urine YELLOW  YELLOW   APPearance HAZY (*) CLEAR   Specific Gravity, Urine >1.030 (*) 1.005 - 1.030   pH 6.0  5.0 - 8.0   Glucose, UA NEGATIVE  NEGATIVE mg/dL   Hgb urine dipstick NEGATIVE  NEGATIVE   Bilirubin Urine NEGATIVE  NEGATIVE   Ketones, ur 15 (*) NEGATIVE mg/dL   Protein, ur NEGATIVE  NEGATIVE mg/dL   Urobilinogen, UA 2.0 (*) 0.0 - 1.0 mg/dL   Nitrite NEGATIVE  NEGATIVE   Leukocytes, UA NEGATIVE  NEGATIVE  AMNISURE RUPTURE OF MEMBRANE (ROM)     Status: Normal   Collection Time   03/22/12  5:33 PM      Component Value Range   Amnisure ROM NEGATIVE    WET PREP, GENITAL     Status: Abnormal   Collection Time   03/22/12  5:55 PM      Component Value Range   Yeast Wet Prep HPF POC FEW (*) NONE SEEN   Trich, Wet Prep NONE SEEN  NONE SEEN   Clue Cells Wet Prep HPF POC NONE SEEN  NONE SEEN   WBC, Wet Prep HPF POC FEW (*) NONE SEEN   EFM: Reactive tracing with no contractions.  Assessment: Monilia vaginosis   Headache resolved with tylenol  Plan:  Tylenol prn for discomfort   Diflucan 150 mg po now   Increase fluids po   Follow up in the office, return here as needed. MDM

## 2012-03-22 NOTE — Progress Notes (Signed)
Written and verbal d/c instructions given and understanding voiced. 

## 2012-03-22 NOTE — MAU Note (Signed)
Dr Tamela Oddi notified of neg amnisure and h/a better after Tylenol. Pt stable for d/c home

## 2012-03-22 NOTE — Discharge Instructions (Signed)
Follow up with Dr. Tamela Oddi, return here as needed  General Headache, Without Cause A general headache has no specific cause. These headaches are not life-threatening. They will not lead to other types of headaches. HOME CARE   Make and keep follow-up visits with your doctor.   Only take medicine as told by your doctor.   Try to relax, get a massage, or use your thoughts to control your body (biofeedback).   Apply cold or heat to the head and neck. Apply 3 or 4 times a day or as needed.  Finding out the results of your test Ask when your test results will be ready. Make sure you get your test results. GET HELP RIGHT AWAY IF:   You have problems with medicine.   Your medicine does not help relieve pain.   Your headache changes or becomes worse.   You feel sick to your stomach (nauseous) or throw up (vomit).   You have a temperature by mouth above 102 F (38.9 C), not controlled by medicine.   Your have a stiff neck.   You have vision loss.   You have muscle weakness.   You lose control of your muscles.   You lose balance or have trouble walking.   You feel like you are going to pass out (faint).  MAKE SURE YOU:   Understand these instructions.   Will watch this condition.   Will get help right away if you are not doing well or get worse.  Document Released: 07/03/2008 Document Revised: 09/13/2011 Document Reviewed: 07/03/2008 [facility] Patient Information 2012 [place], [place].General Headache, Without Cause A general headache has no specific cause. These headaches are not life-threatening. They will not lead to other types of headaches. HOME CARE   Make and keep follow-up visits with your doctor.   Only take medicine as told by your doctor.   Try to relax, get a massage, or use your thoughts to control your body (biofeedback).   Apply cold or heat to the head and neck. Apply 3 or 4 times a day or as needed.  Finding out the results of your test Ask when  your test results will be ready. Make sure you get your test results. GET HELP RIGHT AWAY IF:   You have problems with medicine.   Your medicine does not help relieve pain.   Your headache changes or becomes worse.   You feel sick to your stomach (nauseous) or throw up (vomit).   You have a temperature by mouth above 102 F (38.9 C), not controlled by medicine.   Your have a stiff neck.   You have vision loss.   You have muscle weakness.   You lose control of your muscles.   You lose balance or have trouble walking.   You feel like you are going to pass out (faint).  MAKE SURE YOU:   Understand these instructions.   Will watch this condition.   Will get help right away if you are not doing well or get worse.  Document Released: 07/03/2008 Document Revised: 09/13/2011 Document Reviewed: 07/03/2008 [facility] Patient Information 2012 [place], [place].

## 2012-03-22 NOTE — MAU Note (Signed)
Pt reports having a headache x 4 days. Taking extra strength tylenol without much relief. Also c/o her panties have been wet possible leaking. C/o mild-moderate ct since last night 8-10 min.

## 2012-03-24 LAB — GC/CHLAMYDIA PROBE AMP, GENITAL
Chlamydia, DNA Probe: NEGATIVE
GC Probe Amp, Genital: NEGATIVE

## 2012-04-16 ENCOUNTER — Encounter (HOSPITAL_COMMUNITY): Payer: Self-pay | Admitting: *Deleted

## 2012-04-16 ENCOUNTER — Inpatient Hospital Stay (HOSPITAL_COMMUNITY)
Admission: AD | Admit: 2012-04-16 | Discharge: 2012-04-16 | Disposition: A | Discharge status: Home / Self Care | Payer: Medicare Other | Source: Ambulatory Visit | Attending: Obstetrics & Gynecology | Admitting: Obstetrics & Gynecology

## 2012-04-16 DIAGNOSIS — O99891 Other specified diseases and conditions complicating pregnancy: Secondary | ICD-10-CM | POA: Insufficient documentation

## 2012-04-16 DIAGNOSIS — R109 Unspecified abdominal pain: Secondary | ICD-10-CM | POA: Insufficient documentation

## 2012-04-16 DIAGNOSIS — N898 Other specified noninflammatory disorders of vagina: Secondary | ICD-10-CM

## 2012-04-16 DIAGNOSIS — O4700 False labor before 37 completed weeks of gestation, unspecified trimester: Secondary | ICD-10-CM | POA: Insufficient documentation

## 2012-04-16 DIAGNOSIS — O479 False labor, unspecified: Secondary | ICD-10-CM

## 2012-04-16 DIAGNOSIS — N949 Unspecified condition associated with female genital organs and menstrual cycle: Secondary | ICD-10-CM | POA: Insufficient documentation

## 2012-04-16 LAB — URINALYSIS, ROUTINE W REFLEX MICROSCOPIC
Glucose, UA: NEGATIVE mg/dL
Ketones, ur: NEGATIVE mg/dL
Leukocytes, UA: NEGATIVE
pH: 6 (ref 5.0–8.0)

## 2012-04-16 LAB — POCT FERN TEST: Fern Test: NEGATIVE

## 2012-04-16 NOTE — MAU Provider Note (Signed)
Chief Complaint:  Abdominal Cramping and Vaginal Discharge   First Provider Initiated Contact with Patient 04/16/12 2135      HPI  Tiffany Hall is a 26 y.o. [id number] at [redacted]w[redacted]d presenting with cramping today and increased white vaginal discharge. Denies leakage of fluid, vaginal itching or vaginal bleeding. Good fetal movement. There are no aggravating or aleviating factors.  Past Medical History: Past Medical History  Diagnosis Date  . Migraine headache   . Ovarian cyst   . Sickle cell trait   . Gallstones 2012  . Abnormal Pap smear   . Chlamydia 2008  . Trichomonas 2008  . Gonorrhea 2008  . Anemia     has required blood transfusion  . Urinary tract infection   . Heart murmur     as child  . Abnormal pap 2009    Colpo - unknown results  . Gallstones     Past Surgical History: Past Surgical History  Procedure Date  . Colposcopy   . Dilation and curettage of uterus     required blood transfusion    Family History: Family History  Problem Relation Age of Onset  . Diabetes Mother   . Hypertension Mother   . Hepatitis Mother   . Cancer Mother 89    Breast, cervical  . Diabetes Maternal Grandmother   . Hypertension Maternal Grandmother   . Cancer Maternal Grandmother 40    Breast  . Anesthesia problems Neg Hx   . Other Neg Hx     Social History: History  Substance Use Topics  . Smoking status: Former Smoker    Types: Cigarettes  . Smokeless tobacco: Never Used   Comment: quit with preg  . Alcohol Use: No    Allergies:  Allergies  Allergen Reactions  . Stadol (Butorphanol Tartrate)     Heart races    Meds:  No prescriptions prior to admission   ROS: Pertinent findings in HPI.  Physical Exam  Blood pressure 100/60, pulse 83, temperature 97.3 F (36.3 C), temperature source Oral, resp. rate 20, height 5\' 4"  (1.626 m), weight 76.295 kg (168 lb 3.2 oz), last menstrual period 08/27/2011. GENERAL: Well-developed, well-nourished female in no acute  distress.  HEENT: normocephalic, good dentition HEART: normal rate RESP: normal effort ABDOMEN: Soft, nontender, gravid appropriate for gestational age EXTREMITIES: Nontender, no edema NEURO: alert and oriented  SPECULUM EXAM: Moderate amount of thick, odorless discharge. No VB. Cervix clean, non-friable. Neg Pooling Neg fern  Dilation: Fingertip Effacement (%): Thick Station: Ballotable Exam by:: Ivonne Andrew CNM  FHT:  Baseline 140, moderate variability, accelerations present, no decelerations Contractions: Irreg, w/ UI   Labs: Results for orders placed during the hospital encounter of 04/16/12 (from the past 24 hour(s))  URINALYSIS, ROUTINE W REFLEX MICROSCOPIC     Status: Normal   Collection Time   04/16/12  9:00 PM      Component Value Range   Color, Urine YELLOW  YELLOW   APPearance CLEAR  CLEAR   Specific Gravity, Urine 1.025  1.005 - 1.030   pH 6.0  5.0 - 8.0   Glucose, UA NEGATIVE  NEGATIVE mg/dL   Hgb urine dipstick NEGATIVE  NEGATIVE   Bilirubin Urine NEGATIVE  NEGATIVE   Ketones, ur NEGATIVE  NEGATIVE mg/dL   Protein, ur NEGATIVE  NEGATIVE mg/dL   Urobilinogen, UA 1.0  0.0 - 1.0 mg/dL   Nitrite NEGATIVE  NEGATIVE   Leukocytes, UA NEGATIVE  NEGATIVE  POCT FERN TEST     Status:  Normal   Collection Time   04/16/12 10:48 PM      Component Value Range   Fern Test Negative     Imaging:  NA  Assessment: 1. Preterm contractions   2. Vaginal discharge in pregnancy    Plan: D/C home per consult w/ Dr. Tamela Oddi Preterm labor precautions and FKCs Medication List  As of 04/17/2012  1:01 AM   CONTINUE taking these medications         acetaminophen 500 MG tablet   Commonly known as: TYLENOL      prenatal multivitamin Tabs           Follow-up Information    Follow up with [name], MD. (as scheduled)    Contact information:   [address] [place] [address] [phone]       Follow up with [facility].  (As needed if symptoms worsen)    Contact information:   [address] [place] [address] [phone]        Dorathy Kinsman 7/11/20131:01 AM

## 2012-04-16 NOTE — MAU Note (Signed)
I've been having cramping and watery discharge since last night.

## 2012-04-16 NOTE — Progress Notes (Signed)
Written and verbal d/c instructions given and understanding voiced. 

## 2012-04-16 NOTE — Progress Notes (Signed)
Difficult to trace FHR due to FM. Ok to d/c efm per Ivonne Andrew CNM

## 2012-04-16 NOTE — Progress Notes (Signed)
Fern slide obtained  

## 2012-04-28 ENCOUNTER — Inpatient Hospital Stay (HOSPITAL_COMMUNITY)
Admission: AD | Admit: 2012-04-28 | Discharge: 2012-04-28 | Disposition: A | Discharge status: Home / Self Care | Payer: Medicare Other | Source: Ambulatory Visit | Attending: Obstetrics & Gynecology | Admitting: Obstetrics & Gynecology

## 2012-04-28 ENCOUNTER — Encounter (HOSPITAL_COMMUNITY): Payer: Self-pay

## 2012-04-28 DIAGNOSIS — O479 False labor, unspecified: Secondary | ICD-10-CM

## 2012-04-28 DIAGNOSIS — O4700 False labor before 37 completed weeks of gestation, unspecified trimester: Secondary | ICD-10-CM | POA: Insufficient documentation

## 2012-04-28 LAB — URINALYSIS, ROUTINE W REFLEX MICROSCOPIC
Hgb urine dipstick: NEGATIVE
Nitrite: NEGATIVE
Specific Gravity, Urine: 1.025 (ref 1.005–1.030)
Urobilinogen, UA: 4 mg/dL — ABNORMAL HIGH (ref 0.0–1.0)

## 2012-04-28 MED ORDER — ZOLPIDEM TARTRATE 10 MG PO TABS: 10.0000 mg | ORAL_TABLET | Freq: Every evening | ORAL | Status: DC | PRN

## 2012-04-28 NOTE — MAU Note (Signed)
Pt states ctx's began last pm, now q 6-7 minutes apart. Denies bleeding or lof. Was ft dilated last exam, and very soft per pt.

## 2012-04-28 NOTE — MAU Note (Signed)
Patient is in with c/o lower back pain that radiates to her pelvic and contractions (q65m) since last night. She states that she does not know if the cramping she feels is contractions. Denies any nausea, vomiting or diarrhea, vaginal bleeding or lof. Reports good fetal movement.

## 2012-04-28 NOTE — MAU Provider Note (Signed)
History     CSN: [id number]  Arrival date and time: 04/28/12 1611   None     Chief Complaint  Patient presents with  . Preterm Labor eval    HPI This is a 26 y.o. female at [redacted]w[redacted]d who presents with c/o contractions that hurt in front and back. Started several hours ago. History is remarkable for PTL with all Term deliveries. Pt is on Procardia but last took it yesterday, did not try any today.  Denies leaking or bleeding. Good FM.  OB History    Grav Para Term Preterm Abortions TAB SAB Ect Mult Living   5 3 3  1  1   3       Past Medical History  Diagnosis Date  . Migraine headache   . Ovarian cyst   . Sickle cell trait   . Gallstones 2012  . Abnormal Pap smear   . Chlamydia 2008  . Trichomonas 2008  . Gonorrhea 2008  . Anemia     has required blood transfusion  . Urinary tract infection   . Heart murmur     as child  . Abnormal pap 2009    Colpo - unknown results  . Gallstones     Past Surgical History  Procedure Date  . Colposcopy   . Dilation and curettage of uterus     required blood transfusion    Family History  Problem Relation Age of Onset  . Diabetes Mother   . Hypertension Mother   . Hepatitis Mother   . Cancer Mother 35    Breast, cervical  . Diabetes Maternal Grandmother   . Hypertension Maternal Grandmother   . Cancer Maternal Grandmother 40    Breast  . Anesthesia problems Neg Hx   . Other Neg Hx     History  Substance Use Topics  . Smoking status: Former Smoker    Types: Cigarettes  . Smokeless tobacco: Never Used   Comment: quit with preg  . Alcohol Use: No    Allergies:  Allergies  Allergen Reactions  . Nubain (Nalbuphine Hcl) Swelling and Palpitations  . Stadol (Butorphanol Tartrate)     Heart races    Prescriptions prior to admission  Medication Sig Dispense Refill  . acetaminophen (TYLENOL) 500 MG tablet Take 500 mg by mouth every 6 (six) hours as needed. pain      . Prenatal Vit-Fe Fumarate-FA (PRENATAL  MULTIVITAMIN) TABS Take 1 tablet by mouth every evening.         ROS As listed in HPI  Physical Exam   Blood pressure 102/61, pulse 107, temperature 98.7 F (37.1 C), temperature source Oral, resp. rate 20, height 5\' 4"  (1.626 m), weight 165 lb 8 oz (75.07 kg), last menstrual period 08/27/2011.  Physical Exam  Constitutional: She is oriented to person, place, and time. She appears well-developed and well-nourished. No distress.  Cardiovascular: Normal rate.   Respiratory: Effort normal.  GI: Soft.  Genitourinary: Vagina normal and uterus normal. No vaginal discharge found.       Dilation: 1 Effacement (%): 50 Cervical Position: Posterior Station: -3 Presentation: Vertex Exam by:: Artelia Laroche CNM   Musculoskeletal: Normal range of motion.  Neurological: She is alert and oriented to person, place, and time.  Skin: Skin is warm and dry.  Psychiatric: She has a normal mood and affect.  FHR reactive UCs irregular, short only lasting 30 seconds  MAU Course  Procedures  MDM Discussed with Dr Tamela Oddi. She recommends stopping Procardia.  May  have Ambien  Assessment and Plan  A:  SIUP at [redacted]w[redacted]d       Braxton Hicks contractions vs prodromal contractions  P:  Discharge home       Rx Ambien 5mg  prn      Labor precautions  Wynelle Bourgeois 04/28/2012, 5:29 PM

## 2012-05-05 ENCOUNTER — Encounter (HOSPITAL_COMMUNITY): Payer: Self-pay | Admitting: *Deleted

## 2012-05-05 ENCOUNTER — Inpatient Hospital Stay (HOSPITAL_COMMUNITY)
Admission: AD | Admit: 2012-05-05 | Discharge: 2012-05-05 | Disposition: A | Discharge status: Hospice | Payer: Medicare Other | Source: Ambulatory Visit | Attending: Obstetrics | Admitting: Obstetrics

## 2012-05-05 DIAGNOSIS — O479 False labor, unspecified: Secondary | ICD-10-CM | POA: Insufficient documentation

## 2012-05-05 NOTE — MAU Note (Signed)
Pt says she has been contracting every three minutes since 1855.  Denies any leaking or bleeding.  Says she has had a lot of white, thin mucous.

## 2012-05-05 NOTE — MAU Note (Signed)
PT SAYS HURT BAD   AT 655PM.   CALLED OFFICE - TOLD TO COME IN. NO VE IN OFFICE .  WAS HERE LAST WEEK-  VE- 1 CM  AND 50.   DENIES HSV AND MRSA.

## 2012-05-10 ENCOUNTER — Inpatient Hospital Stay (HOSPITAL_COMMUNITY)
Admission: AD | Admit: 2012-05-10 | Discharge: 2012-05-10 | Disposition: A | Discharge status: Home / Self Care | Payer: Medicare Other | Source: Ambulatory Visit | Attending: Obstetrics | Admitting: Obstetrics

## 2012-05-10 ENCOUNTER — Encounter (HOSPITAL_COMMUNITY): Payer: Self-pay | Admitting: *Deleted

## 2012-05-10 DIAGNOSIS — O99891 Other specified diseases and conditions complicating pregnancy: Secondary | ICD-10-CM | POA: Insufficient documentation

## 2012-05-10 DIAGNOSIS — R079 Chest pain, unspecified: Secondary | ICD-10-CM | POA: Insufficient documentation

## 2012-05-10 DIAGNOSIS — R51 Headache: Secondary | ICD-10-CM | POA: Insufficient documentation

## 2012-05-10 LAB — URINALYSIS, ROUTINE W REFLEX MICROSCOPIC
Leukocytes, UA: NEGATIVE
Nitrite: NEGATIVE
Protein, ur: NEGATIVE mg/dL
Urobilinogen, UA: 0.2 mg/dL (ref 0.0–1.0)

## 2012-05-10 MED ORDER — OXYCODONE-ACETAMINOPHEN 5-325 MG PO TABS
2.0000 | ORAL_TABLET | Freq: Once | ORAL | Status: AC
  Administered 2012-05-10: 2 via ORAL
  Filled 2012-05-10: qty 2

## 2012-05-10 NOTE — Progress Notes (Signed)
Written and verbal d/c instructions given and understanding voiced. Eve Key NP in to discuss d/c instructions and plan of care.

## 2012-05-10 NOTE — MAU Note (Signed)
Patient presents with coughing since this morning c/o chest discomfort on right side under right breast hurts when takes deep breath, patient is [redacted]w[redacted]d, headache

## 2012-05-10 NOTE — Progress Notes (Signed)
States ctxs are not painful "They just take my breath:"

## 2012-05-10 NOTE — MAU Provider Note (Signed)
History     CSN: [id number]  Arrival date and time: 05/10/12 1651   First Provider Initiated Contact with Patient 05/10/12 1742      Chief Complaint  Patient presents with  . Chest Pain    pain under right breast    HPI Tiffany Hall is 26 y.o. [id number] [redacted]w[redacted]d weeks presenting with headache and chest pain since 8am.  Patient sees Dr. Durwin Glaze seen 3 days ago.  Pregnancy has been uncomplicated.  Denies vaginal bleeding, leaking of fluid.  + Fetal movement.   Hx of headaches.  Has taken Tylenol 1 tab at 6am.  Went back to sleep but pain continued when she awoke.  Has headache qod.  Describes chest pain as pressure especially when she inhales.  Pressure also felt under right breast.  Denies exposure to illness, congestion.  Patient reported to RN she has had a cough this week but not now.      Past Medical History  Diagnosis Date  . Migraine headache   . Ovarian cyst   . Sickle cell trait   . Gallstones 2012  . Abnormal Pap smear   . Chlamydia 2008  . Trichomonas 2008  . Gonorrhea 2008  . Anemia     has required blood transfusion  . Urinary tract infection   . Heart murmur     as child  . Abnormal pap 2009    Colpo - unknown results  . Gallstones     Past Surgical History  Procedure Date  . Colposcopy   . Dilation and curettage of uterus     required blood transfusion    Family History  Problem Relation Age of Onset  . Diabetes Mother   . Hypertension Mother   . Hepatitis Mother   . Cancer Mother 10    Breast, cervical  . Diabetes Maternal Grandmother   . Hypertension Maternal Grandmother   . Cancer Maternal Grandmother 40    Breast  . Anesthesia problems Neg Hx   . Other Neg Hx     History  Substance Use Topics  . Smoking status: Former Smoker -- 0.1 packs/day for 10 years    Types: Cigarettes    Quit date: 08/05/2009  . Smokeless tobacco: Never Used   Comment: quit with preg  . Alcohol Use: 0.0 oz/week     prior to pregnancy    Allergies:    Allergies  Allergen Reactions  . Nubain (Nalbuphine Hcl) Swelling and Palpitations  . Stadol (Butorphanol Tartrate)     Heart races    Prescriptions prior to admission  Medication Sig Dispense Refill  . acetaminophen (TYLENOL) 500 MG tablet Take 500 mg by mouth every 6 (six) hours as needed. pain      . Prenatal Vit-Fe Fumarate-FA (PRENATAL MULTIVITAMIN) TABS Take 1 tablet by mouth every evening.       . zolpidem (AMBIEN) 10 MG tablet Take 1 tablet (10 mg total) by mouth at bedtime as needed for sleep.  5 tablet  0    Review of Systems  Respiratory: Positive for cough (this past week).   Cardiovascular: Positive for chest pain.  Gastrointestinal: Negative for nausea and vomiting.  Genitourinary: Negative.   Neurological: Positive for headaches.   Physical Exam   Blood pressure 112/67, pulse 97, temperature 98.5 F (36.9 C), resp. rate 18, height 5\' 4"  (1.626 m), weight 76.93 kg (169 lb 9.6 oz), last menstrual period 08/27/2011, SpO2 99.00%.  Physical Exam  Constitutional: She is oriented to person, place,  and time. She appears well-developed and well-nourished. No distress.  HENT:  Head: Normocephalic.       photophobic  Neck: Normal range of motion.  Cardiovascular: Normal rate.   Respiratory: Effort normal.  Genitourinary:       Cervix checked by J0,RN  1cm dilated, 70% effaced  Neurological: She is alert and oriented to person, place, and time. No cranial nerve deficit. Coordination normal.  Skin: Skin is warm and dry.   Results for orders placed during the hospital encounter of 05/10/12 (from the past 24 hour(s))  URINALYSIS, ROUTINE W REFLEX MICROSCOPIC     Status: Abnormal   Collection Time   05/10/12  4:59 PM      Component Value Range   Color, Urine STRAW (*) YELLOW   APPearance CLEAR  CLEAR   Specific Gravity, Urine <1.005 (*) 1.005 - 1.030   pH 6.5  5.0 - 8.0   Glucose, UA NEGATIVE  NEGATIVE mg/dL   Hgb urine dipstick NEGATIVE  NEGATIVE   Bilirubin Urine  NEGATIVE  NEGATIVE   Ketones, ur NEGATIVE  NEGATIVE mg/dL   Protein, ur NEGATIVE  NEGATIVE mg/dL   Urobilinogen, UA 0.2  0.0 - 1.0 mg/dL   Nitrite NEGATIVE  NEGATIVE   Leukocytes, UA NEGATIVE  NEGATIVE   FMS  Baseline 140, moderate variability, reactive.  Occ irregular contraction  EKG normal sinus rhythm, nonspecific T wave abnormality.    MAU Course  Procedures  MDM  18:00 Reported MSE to Dr. Clearance Coots.  Explained patient prefers po treatment of H/A because she has 3 young children and her husband has to go to work [employer].  Order given for 2 Percocet tabs and EKG. Patient has taken Percocet in the past without allergic reaction.   20:18  Patient reports headache is now 3/10.  No further chest pressure 20:25 Reported patient's response to medication and EKG results.  May discharge to home.  Assessment and Plan  A:  Headache in pregnancy at [redacted]w[redacted]d    Blood pressures are normal,  P:  Keep scheduled appointment with DR. Harper    Call if sxs worsen     Importance of hydration  Tiffany Hall,EVE M 05/10/2012, 5:45 PM

## 2012-05-14 ENCOUNTER — Other Ambulatory Visit: Payer: Self-pay | Admitting: Obstetrics

## 2012-05-15 ENCOUNTER — Telehealth (HOSPITAL_COMMUNITY): Payer: Self-pay | Admitting: *Deleted

## 2012-05-15 ENCOUNTER — Encounter (HOSPITAL_COMMUNITY): Payer: Self-pay | Admitting: *Deleted

## 2012-05-15 NOTE — Telephone Encounter (Signed)
Preadmission screen  

## 2012-05-16 ENCOUNTER — Telehealth (HOSPITAL_COMMUNITY): Payer: Self-pay | Admitting: *Deleted

## 2012-05-16 ENCOUNTER — Encounter (HOSPITAL_COMMUNITY): Payer: Self-pay | Admitting: *Deleted

## 2012-05-16 NOTE — Telephone Encounter (Signed)
Preadmission screen  

## 2012-05-18 ENCOUNTER — Encounter (HOSPITAL_COMMUNITY): Payer: Self-pay

## 2012-05-18 ENCOUNTER — Inpatient Hospital Stay (HOSPITAL_COMMUNITY)
Admission: AD | Admit: 2012-05-18 | Discharge: 2012-05-18 | Disposition: A | Discharge status: Home / Self Care | Payer: Medicare Other | Source: Ambulatory Visit | Attending: Obstetrics | Admitting: Obstetrics

## 2012-05-18 DIAGNOSIS — O479 False labor, unspecified: Secondary | ICD-10-CM | POA: Insufficient documentation

## 2012-05-18 LAB — URINALYSIS, ROUTINE W REFLEX MICROSCOPIC
Nitrite: NEGATIVE
Protein, ur: NEGATIVE mg/dL
Urobilinogen, UA: 2 mg/dL — ABNORMAL HIGH (ref 0.0–1.0)

## 2012-05-18 LAB — URINE MICROSCOPIC-ADD ON

## 2012-05-18 NOTE — MAU Note (Signed)
Pt states, " I've been having contractions since 11 am Saturday am, but they became regular at 11 pm tonight and about every 5-6 min. I have alot more discharge, maybe my mucus plug"

## 2012-05-21 ENCOUNTER — Encounter (HOSPITAL_COMMUNITY): Payer: Self-pay

## 2012-05-21 ENCOUNTER — Inpatient Hospital Stay (HOSPITAL_COMMUNITY)
Admission: RE | Admit: 2012-05-21 | Discharge: 2012-05-23 | DRG: 775 | Disposition: A | Discharge status: Home / Self Care | Payer: Medicare Other | Source: Ambulatory Visit | Attending: Obstetrics | Admitting: Obstetrics

## 2012-05-21 DIAGNOSIS — Z3A39 39 weeks gestation of pregnancy: Secondary | ICD-10-CM

## 2012-05-21 LAB — CBC
HCT: 26.7 % — ABNORMAL LOW (ref 36.0–46.0)
Hemoglobin: 8.9 g/dL — ABNORMAL LOW (ref 12.0–15.0)
RBC: 3.46 MIL/uL — ABNORMAL LOW (ref 3.87–5.11)
RDW: 15.3 % (ref 11.5–15.5)
WBC: 5 10*3/uL (ref 4.0–10.5)

## 2012-05-21 LAB — PREPARE RBC (CROSSMATCH)

## 2012-05-21 MED ORDER — OXYTOCIN 40 UNITS IN LACTATED RINGERS INFUSION - SIMPLE MED
INTRAVENOUS | Status: AC
  Filled 2012-05-21: qty 1000

## 2012-05-21 MED ORDER — FLEET ENEMA 7-19 GM/118ML RE ENEM: 1.0000 | ENEMA | RECTAL | Status: DC | PRN

## 2012-05-21 MED ORDER — OXYTOCIN BOLUS FROM INFUSION
250.0000 mL | Freq: Once | INTRAVENOUS | Status: DC
  Filled 2012-05-21: qty 500

## 2012-05-21 MED ORDER — ACETAMINOPHEN 325 MG PO TABS: 650.0000 mg | ORAL_TABLET | ORAL | Status: DC | PRN

## 2012-05-21 MED ORDER — ONDANSETRON HCL 4 MG/2ML IJ SOLN: 4.0000 mg | Freq: Four times a day (QID) | INTRAMUSCULAR | Status: DC | PRN

## 2012-05-21 MED ORDER — TERBUTALINE SULFATE 1 MG/ML IJ SOLN: 0.2500 mg | Freq: Once | INTRAMUSCULAR | Status: AC | PRN

## 2012-05-21 MED ORDER — LIDOCAINE HCL (PF) 1 % IJ SOLN
30.0000 mL | INTRAMUSCULAR | Status: DC | PRN
  Filled 2012-05-21 (×2): qty 30

## 2012-05-21 MED ORDER — BUTORPHANOL TARTRATE 1 MG/ML IJ SOLN: 1.0000 mg | INTRAMUSCULAR | Status: DC | PRN

## 2012-05-21 MED ORDER — OXYCODONE-ACETAMINOPHEN 5-325 MG PO TABS: 1.0000 | ORAL_TABLET | ORAL | Status: DC | PRN

## 2012-05-21 MED ORDER — LACTATED RINGERS IV SOLN
INTRAVENOUS | Status: DC
  Administered 2012-05-21 – 2012-05-22 (×3): via INTRAVENOUS

## 2012-05-21 MED ORDER — LACTATED RINGERS IV SOLN: 500.0000 mL | INTRAVENOUS | Status: DC | PRN

## 2012-05-21 MED ORDER — OXYTOCIN 40 UNITS IN LACTATED RINGERS INFUSION - SIMPLE MED
1.0000 m[IU]/min | INTRAVENOUS | Status: DC
  Administered 2012-05-21: 2 m[IU]/min via INTRAVENOUS

## 2012-05-21 MED ORDER — OXYTOCIN 40 UNITS IN LACTATED RINGERS INFUSION - SIMPLE MED: 62.5000 mL/h | Freq: Once | INTRAVENOUS | Status: DC

## 2012-05-21 MED ORDER — IBUPROFEN 600 MG PO TABS: 600.0000 mg | ORAL_TABLET | Freq: Four times a day (QID) | ORAL | Status: DC | PRN

## 2012-05-21 MED ORDER — CITRIC ACID-SODIUM CITRATE 334-500 MG/5ML PO SOLN: 30.0000 mL | ORAL | Status: DC | PRN

## 2012-05-21 NOTE — Progress Notes (Signed)
Tiffany Hall is a 26 y.o. [id number] at [redacted]w[redacted]d by LMP admitted for induction of labor due to Elective at term.  Subjective: Comfortable  Objective: BP 110/70  Pulse 86  Temp 98 F (36.7 C) (Oral)  Resp 18  Ht 5\' 4"  (1.626 m)  Wt 75.751 kg (167 lb)  BMI 28.67 kg/m2  LMP 08/27/2011      FHT:  FHR: 140 bpm, variability: moderate,  accelerations:  Present,  decelerations:  Absent UC:   irregular, every 5 minutes SVE:   Dilation: 3.5 Effacement (%): 50 Station: -1 Exam by:: a. harper, rnc-ob  Labs: Lab Results  Component Value Date   WBC 5.0 05/21/2012   HGB 8.9* 05/21/2012   HCT 26.7* 05/21/2012   MCV 77.2* 05/21/2012   PLT 159 05/21/2012    Assessment / Plan: Prodromal labor  Labor: see above; serial IOL attempt Preeclampsia:  n/a Fetal Wellbeing:  Category I Pain Control:  Labor support without medications I/D:  n/a Anticipated MOD:  NSVD  JACKSON-MOORE,Raunak Antuna A 05/21/2012, 8:23 PM

## 2012-05-21 NOTE — H&P (Signed)
Tiffany Hall is a 26 y.o. female presenting for elective IOL. Maternal Medical History:  Reason for admission: 26 yo G5 P3.  EDC 05-27-12.  Presents for elective IOL.  Fetal activity: Perceived fetal activity is normal.   Last perceived fetal movement was within the past hour.    Prenatal complications: no prenatal complications Prenatal Complications - Diabetes: none.    OB History    Grav Para Term Preterm Abortions TAB SAB Ect Mult Living   5 3 3  1  1   3      Past Medical History  Diagnosis Date  . Migraine headache   . Ovarian cyst   . Sickle cell trait   . Gallstones 2012  . Abnormal Pap smear   . Chlamydia 2008  . Trichomonas 2008  . Gonorrhea 2008  . Anemia     has required blood transfusion  . Urinary tract infection   . Heart murmur     as child  . Abnormal pap 2009    Colpo - unknown results  . Gallstones   . Depression   . H/O varicella    Past Surgical History  Procedure Date  . Colposcopy   . Dilation and curettage of uterus     required blood transfusion   Family History: family history includes Cancer (age of onset:23) in her mother; Cancer (age of onset:40) in her maternal grandmother; Diabetes in her maternal grandmother and mother; Hepatitis in her mother; and Hypertension in her maternal grandmother and mother.  There is no history of Anesthesia problems and Other. Social History:  reports that she quit smoking about 2 years ago. Her smoking use included Cigarettes. She has a 1 pack-year smoking history. She has never used smokeless tobacco. She reports that she drinks alcohol. She reports that she uses illicit drugs (MDMA (Ecstacy) and Cocaine).   Prenatal Transfer Tool  Maternal Diabetes: No Genetic Screening: Normal Maternal Ultrasounds/Referrals: Normal Fetal Ultrasounds or other Referrals:  None Maternal Substance Abuse:  No Significant Maternal Medications:  Meds include: Other: see prenatal record Significant Maternal Lab Results:   Lab values include: Group B Strep negative Other Comments:  None  Review of Systems  All other systems reviewed and are negative.      Blood pressure 119/82, pulse 101, temperature 98.2 F (36.8 C), temperature source Oral, resp. rate 18, height 5\' 4"  (1.626 m), weight 75.751 kg (167 lb), last menstrual period 08/27/2011. Maternal Exam:  Abdomen: Patient reports no abdominal tenderness. Fetal presentation: vertex  Introitus: Normal vulva. Normal vagina.  Pelvis: adequate for delivery.   Cervix: Cervix evaluated by digital exam.     Physical Exam  Nursing note and vitals reviewed. Constitutional: She is oriented to person, place, and time. She appears well-developed and well-nourished.  HENT:  Head: Normocephalic and atraumatic.  Eyes: Conjunctivae are normal. Pupils are equal, round, and reactive to light.  Neck: Normal range of motion. Neck supple.  Cardiovascular: Normal rate and regular rhythm.   Respiratory: Effort normal and breath sounds normal.  GI: Soft.  Genitourinary: Vagina normal and uterus normal.  Musculoskeletal: Normal range of motion.  Neurological: She is alert and oriented to person, place, and time.  Skin: Skin is warm and dry.  Psychiatric: She has a normal mood and affect. Her behavior is normal. Judgment and thought content normal.    Prenatal labs: ABO, Rh: O/Positive/-- (02/26 0000) Antibody: Negative (02/26 0000) Rubella:   RPR: Nonreactive (02/26 0000)  HBsAg: Negative (02/26 0000)  HIV:  Non-reactive (02/26 0000)  GBS: Negative (06/27 0000)   Assessment/Plan: 39 weeks.  Multiparity.   Elective IOL.  Low dose pitocin per protocol.  Beata Beason A 05/21/2012, 8:14 AM

## 2012-05-22 ENCOUNTER — Inpatient Hospital Stay (HOSPITAL_COMMUNITY): Payer: Medicare Other | Admitting: Anesthesiology

## 2012-05-22 ENCOUNTER — Encounter (HOSPITAL_COMMUNITY): Payer: Self-pay

## 2012-05-22 ENCOUNTER — Encounter (HOSPITAL_COMMUNITY): Payer: Self-pay | Admitting: Anesthesiology

## 2012-05-22 MED ORDER — SIMETHICONE 80 MG PO CHEW: 80.0000 mg | CHEWABLE_TABLET | ORAL | Status: DC | PRN

## 2012-05-22 MED ORDER — EPHEDRINE 5 MG/ML INJ: 10.0000 mg | INTRAVENOUS | Status: DC | PRN

## 2012-05-22 MED ORDER — PHENYLEPHRINE 40 MCG/ML (10ML) SYRINGE FOR IV PUSH (FOR BLOOD PRESSURE SUPPORT)
80.0000 ug | PREFILLED_SYRINGE | INTRAVENOUS | Status: DC | PRN
  Filled 2012-05-22: qty 5

## 2012-05-22 MED ORDER — WITCH HAZEL-GLYCERIN EX PADS: 1.0000 "application " | MEDICATED_PAD | CUTANEOUS | Status: DC | PRN

## 2012-05-22 MED ORDER — MEDROXYPROGESTERONE ACETATE 150 MG/ML IM SUSP: 150.0000 mg | INTRAMUSCULAR | Status: DC | PRN

## 2012-05-22 MED ORDER — DIPHENHYDRAMINE HCL 25 MG PO CAPS: 25.0000 mg | ORAL_CAPSULE | Freq: Four times a day (QID) | ORAL | Status: DC | PRN

## 2012-05-22 MED ORDER — BENZOCAINE-MENTHOL 20-0.5 % EX AERO: 1.0000 "application " | INHALATION_SPRAY | CUTANEOUS | Status: DC | PRN

## 2012-05-22 MED ORDER — LIDOCAINE HCL (PF) 1 % IJ SOLN
INTRAMUSCULAR | Status: DC | PRN
  Administered 2012-05-22 (×2): 5 mL

## 2012-05-22 MED ORDER — MISOPROSTOL 25 MCG QUARTER TABLET
25.0000 ug | ORAL_TABLET | ORAL | Status: DC
  Administered 2012-05-22: 25 ug via VAGINAL
  Filled 2012-05-22: qty 0.25

## 2012-05-22 MED ORDER — PRENATAL MULTIVITAMIN CH
1.0000 | ORAL_TABLET | Freq: Every day | ORAL | Status: DC
  Administered 2012-05-22 – 2012-05-23 (×2): 1 via ORAL
  Filled 2012-05-22 (×2): qty 1

## 2012-05-22 MED ORDER — IBUPROFEN 600 MG PO TABS
600.0000 mg | ORAL_TABLET | Freq: Four times a day (QID) | ORAL | Status: DC
  Administered 2012-05-22 – 2012-05-23 (×3): 600 mg via ORAL
  Filled 2012-05-22 (×4): qty 1

## 2012-05-22 MED ORDER — OXYTOCIN 40 UNITS IN LACTATED RINGERS INFUSION - SIMPLE MED: 62.5000 mL/h | INTRAVENOUS | Status: DC | PRN

## 2012-05-22 MED ORDER — DIBUCAINE 1 % RE OINT: 1.0000 "application " | TOPICAL_OINTMENT | RECTAL | Status: DC | PRN

## 2012-05-22 MED ORDER — LANOLIN HYDROUS EX OINT: TOPICAL_OINTMENT | CUTANEOUS | Status: DC | PRN

## 2012-05-22 MED ORDER — PHENYLEPHRINE 40 MCG/ML (10ML) SYRINGE FOR IV PUSH (FOR BLOOD PRESSURE SUPPORT): 80.0000 ug | PREFILLED_SYRINGE | INTRAVENOUS | Status: DC | PRN

## 2012-05-22 MED ORDER — ONDANSETRON HCL 4 MG PO TABS: 4.0000 mg | ORAL_TABLET | ORAL | Status: DC | PRN

## 2012-05-22 MED ORDER — FENTANYL 2.5 MCG/ML BUPIVACAINE 1/10 % EPIDURAL INFUSION (WH - ANES)
14.0000 mL/h | INTRAMUSCULAR | Status: DC
  Administered 2012-05-22 (×2): 14 mL/h via EPIDURAL
  Filled 2012-05-22 (×2): qty 60

## 2012-05-22 MED ORDER — SENNOSIDES-DOCUSATE SODIUM 8.6-50 MG PO TABS
2.0000 | ORAL_TABLET | Freq: Every day | ORAL | Status: DC
  Administered 2012-05-22: 2 via ORAL

## 2012-05-22 MED ORDER — LACTATED RINGERS IV SOLN: 500.0000 mL | Freq: Once | INTRAVENOUS | Status: DC

## 2012-05-22 MED ORDER — OXYCODONE-ACETAMINOPHEN 5-325 MG PO TABS
1.0000 | ORAL_TABLET | ORAL | Status: DC | PRN
  Administered 2012-05-23: 1 via ORAL
  Filled 2012-05-22: qty 1

## 2012-05-22 MED ORDER — EPHEDRINE 5 MG/ML INJ
10.0000 mg | INTRAVENOUS | Status: DC | PRN
  Filled 2012-05-22: qty 4

## 2012-05-22 MED ORDER — ONDANSETRON HCL 4 MG/2ML IJ SOLN: 4.0000 mg | INTRAMUSCULAR | Status: DC | PRN

## 2012-05-22 MED ORDER — TETANUS-DIPHTH-ACELL PERTUSSIS 5-2.5-18.5 LF-MCG/0.5 IM SUSP
0.5000 mL | Freq: Once | INTRAMUSCULAR | Status: AC
  Administered 2012-05-23: 0.5 mL via INTRAMUSCULAR
  Filled 2012-05-22: qty 0.5

## 2012-05-22 MED ORDER — ZOLPIDEM TARTRATE 5 MG PO TABS: 5.0000 mg | ORAL_TABLET | Freq: Every evening | ORAL | Status: DC | PRN

## 2012-05-22 MED ORDER — DIPHENHYDRAMINE HCL 50 MG/ML IJ SOLN: 12.5000 mg | INTRAMUSCULAR | Status: DC | PRN

## 2012-05-22 NOTE — Anesthesia Procedure Notes (Signed)
Epidural Patient location during procedure: OB Start time: 05/22/2012 6:14 AM  Staffing Anesthesiologist: Brayton Caves R Performed by: anesthesiologist   Preanesthetic Checklist Completed: patient identified, site marked, surgical consent, pre-op evaluation, timeout performed, IV checked, risks and benefits discussed and monitors and equipment checked  Epidural Patient position: sitting Prep: site prepped and draped and DuraPrep Patient monitoring: continuous pulse ox and blood pressure Approach: midline Injection technique: LOR air and LOR saline  Needle:  Needle type: Tuohy  Needle gauge: 17 G Needle length: 9 cm Needle insertion depth: 5 cm cm Catheter type: closed end flexible Catheter size: 19 Gauge Catheter at skin depth: 10 cm Test dose: negative  Assessment Events: blood not aspirated, injection not painful, no injection resistance, negative IV test and no paresthesia  Additional Notes Patient identified.  Risk benefits discussed including failed block, incomplete pain control, headache, nerve damage, paralysis, blood pressure changes, nausea, vomiting, reactions to medication both toxic or allergic, and postpartum back pain.  Patient expressed understanding and wished to proceed.  All questions were answered.  Sterile technique used throughout procedure and epidural site dressed with sterile barrier dressing. No paresthesia or other complications noted.The patient did not experience any signs of intravascular injection such as tinnitus or metallic taste in mouth nor signs of intrathecal spread such as rapid motor block. Please see nursing notes for vital signs.

## 2012-05-22 NOTE — Anesthesia Preprocedure Evaluation (Signed)
Anesthesia Evaluation  Patient identified by MRN, date of birth, ID band Patient awake    Reviewed: Allergy & Precautions, H&P , Patient's Chart, lab work & pertinent test results  Airway Mallampati: II TM Distance: >3 FB Neck ROM: full    Dental No notable dental hx.    Pulmonary neg pulmonary ROS,  breath sounds clear to auscultation  Pulmonary exam normal       Cardiovascular negative cardio ROS  + Valvular Problems/Murmurs Rhythm:regular Rate:Normal     Neuro/Psych  Headaches, negative neurological ROS  negative psych ROS   GI/Hepatic negative GI ROS, Neg liver ROS,   Endo/Other  negative endocrine ROS  Renal/GU negative Renal ROS     Musculoskeletal   Abdominal   Peds  Hematology negative hematology ROS (+)   Anesthesia Other Findings Migraine headache     Ovarian cyst        Sickle cell trait     Gallstones 2012      Abnormal Pap smear     Chlamydia 2008      Trichomonas 2008   Gonorrhea 2008      Anemia   has required blood transfusion Urinary tract infection        Heart murmur   as child Abnormal pap 2009 Colpo - unknown results    Gallstones     Depression        H/O varicella    Reproductive/Obstetrics (+) Pregnancy                           Anesthesia Physical Anesthesia Plan  ASA: II  Anesthesia Plan: Epidural   Post-op Pain Management:    Induction:   Airway Management Planned:   Additional Equipment:   Intra-op Plan:   Post-operative Plan:   Informed Consent: I have reviewed the patients History and Physical, chart, labs and discussed the procedure including the risks, benefits and alternatives for the proposed anesthesia with the patient or authorized representative who has indicated his/her understanding and acceptance.     Plan Discussed with:   Anesthesia Plan Comments:         Anesthesia Quick Evaluation

## 2012-05-22 NOTE — Anesthesia Postprocedure Evaluation (Signed)
  Anesthesia Post-op Note  Patient: Tiffany Hall  Procedure(s) Performed: * No procedures listed *  Patient Location: Mother/Baby  Anesthesia Type: Epidural  Level of Consciousness: awake, alert  and oriented  Airway and Oxygen Therapy: Patient Spontanous Breathing  Post-op Pain: none  Post-op Assessment: Post-op Vital signs reviewed  Post-op Vital Signs: Reviewed and stable  Complications: No apparent anesthesia complications

## 2012-05-22 NOTE — Progress Notes (Signed)
Orders received to restart pitocin per protocol, do not exceed 91mu/min per dr. Verdell Carmine orders.

## 2012-05-22 NOTE — Progress Notes (Signed)
Tiffany Hall is a 26 y.o. [id number] at [redacted]w[redacted]d by LMP admitted for induction of labor due to Elective at term.  Subjective:   Objective: BP 118/68  Pulse 89  Temp 98.1 F (36.7 C) (Oral)  Resp 20  Ht 5\' 4"  (1.626 m)  Wt 75.751 kg (167 lb)  BMI 28.67 kg/m2  LMP 08/27/2011      FHT:  FHR: 150 bpm, variability: moderate,  accelerations:  Present,  decelerations:  Absent UC:   regular, every 3 minutes SVE:   Dilation: 10 Effacement (%): 100 Station: +1 Exam by:: felkel,rn  Labs: Lab Results  Component Value Date   WBC 5.0 05/21/2012   HGB 8.9* 05/21/2012   HCT 26.7* 05/21/2012   MCV 77.2* 05/21/2012   PLT 159 05/21/2012    Assessment / Plan: Induction of labor due to elective,  progressing well on pitocin  Labor: Progressing normally Preeclampsia:  n/a Fetal Wellbeing:  Category I Pain Control:  Epidural I/D:  n/a Anticipated MOD:  NSVD  Tiffany Hall A 05/22/2012, 10:22 AM

## 2012-05-23 LAB — CBC
HCT: 28.1 % — ABNORMAL LOW (ref 36.0–46.0)
Hemoglobin: 9.5 g/dL — ABNORMAL LOW (ref 12.0–15.0)
MCHC: 33.8 g/dL (ref 30.0–36.0)
RBC: 3.65 MIL/uL — ABNORMAL LOW (ref 3.87–5.11)

## 2012-05-23 NOTE — Progress Notes (Signed)
Post Partum Day 1 Subjective: no complaints  Objective: Blood pressure 103/68, pulse 83, temperature 98.3 F (36.8 C), temperature source Oral, resp. rate 18, height 5\' 4"  (1.626 m), weight 75.751 kg (167 lb), last menstrual period 08/27/2011, SpO2 99.00%, unknown if currently breastfeeding.  Physical Exam:  General: alert and no distress Lochia: appropriate Uterine Fundus: firm Incision: healing well DVT Evaluation: No evidence of DVT seen on physical exam.   Basename 05/23/12 0525 05/21/12 0730  HGB 9.5* 8.9*  HCT 28.1* 26.7*    Assessment/Plan: Plan for discharge tomorrow   LOS: 2 days   Tiffany Hall A 05/23/2012, 12:57 PM

## 2012-05-23 NOTE — Discharge Summary (Signed)
Obstetric Discharge Summary Reason for Admission: onset of labor Prenatal Procedures: none Intrapartum Procedures: spontaneous vaginal delivery Postpartum Procedures: none Complications-Operative and Postpartum: none Hemoglobin  Date Value Range Status  05/23/2012 9.5* 12.0 - 15.0 g/dL Final     HCT  Date Value Range Status  05/23/2012 28.1* 36.0 - 46.0 % Final    Physical Exam:  General: alert Lochia: appropriate Uterine Fundus: firm Incision: healing well DVT Evaluation: No evidence of DVT seen on physical exam.  Discharge Diagnoses: Term Pregnancy-delivered  Discharge Information: Date: 05/23/2012 Activity: pelvic rest Diet: routine Medications: Percocet Condition: stable Instructions: refer to practice specific booklet Discharge to: home Follow-up Information    Follow up with HARPER,CHARLES A, MD. Call in 6 weeks.   Contact information:   [address] [place] [address] [phone]          Newborn Data: Live born female  Birth Weight: 6 lb 14.8 oz (3140 g) APGAR: 9, 9  Home with mother.  Kallan Merrick A 05/23/2012, 1:09 PM

## 2012-05-23 NOTE — Progress Notes (Signed)
UR chart review completed.  

## 2012-05-25 LAB — TYPE AND SCREEN
ABO/RH(D): O POS
Unit division: 0

## 2012-05-26 ENCOUNTER — Encounter (HOSPITAL_COMMUNITY): Payer: Self-pay | Admitting: *Deleted

## 2012-05-26 ENCOUNTER — Inpatient Hospital Stay (HOSPITAL_COMMUNITY)
Admission: AD | Admit: 2012-05-26 | Discharge: 2012-05-26 | Disposition: A | Discharge status: Home / Self Care | Payer: Medicare Other | Source: Ambulatory Visit | Attending: Obstetrics & Gynecology | Admitting: Obstetrics & Gynecology

## 2012-05-26 DIAGNOSIS — O864 Pyrexia of unknown origin following delivery: Secondary | ICD-10-CM | POA: Insufficient documentation

## 2012-05-26 DIAGNOSIS — O9123 Nonpurulent mastitis associated with lactation: Secondary | ICD-10-CM

## 2012-05-26 DIAGNOSIS — O9122 Nonpurulent mastitis associated with the puerperium: Secondary | ICD-10-CM | POA: Insufficient documentation

## 2012-05-26 LAB — CBC WITH DIFFERENTIAL/PLATELET
Basophils Absolute: 0 10*3/uL (ref 0.0–0.1)
Eosinophils Relative: 1 % (ref 0–5)
Lymphocytes Relative: 15 % (ref 12–46)
Lymphs Abs: 1.2 10*3/uL (ref 0.7–4.0)
Neutro Abs: 5.9 10*3/uL (ref 1.7–7.7)
Neutrophils Relative %: 76 % (ref 43–77)
Platelets: 267 10*3/uL (ref 150–400)
RBC: 3.82 MIL/uL — ABNORMAL LOW (ref 3.87–5.11)
RDW: 15.8 % — ABNORMAL HIGH (ref 11.5–15.5)
WBC: 7.8 10*3/uL (ref 4.0–10.5)

## 2012-05-26 LAB — URINALYSIS, ROUTINE W REFLEX MICROSCOPIC
Glucose, UA: NEGATIVE mg/dL
pH: 6.5 (ref 5.0–8.0)

## 2012-05-26 LAB — URINE MICROSCOPIC-ADD ON

## 2012-05-26 MED ORDER — ACETAMINOPHEN 500 MG PO TABS
1000.0000 mg | ORAL_TABLET | Freq: Once | ORAL | Status: AC
  Administered 2012-05-26: 1000 mg via ORAL
  Filled 2012-05-26: qty 2

## 2012-05-26 MED ORDER — DICLOXACILLIN SODIUM 500 MG PO CAPS: 500.0000 mg | ORAL_CAPSULE | Freq: Four times a day (QID) | ORAL | Status: AC

## 2012-05-26 NOTE — MAU Provider Note (Signed)
Tiffany R Crowder25 y.o.[id number] @5  d post NSVD Chief Complaint  Patient presents with  . Fever     Initiated contact at 0310    SUBJECTIVE  HPI: Presents with L>R breast pain and engorgement for 2 days and fever to101 and chills tonight. Feels achey all over. She tried to breastfeed while in the hospital, then switched to bottle. She went back to breastfeeding since yesterday and is also pumping due to engorgement. Has not taken any pain medicine.  Lochia tapering. Has slight dry cough, but no CP, SOB. No UR sx. No dysuria or abd pain.   Past Medical History  Diagnosis Date  . Migraine headache   . Ovarian cyst   . Sickle cell trait   . Gallstones 2012  . Abnormal Pap smear   . Chlamydia 2008  . Trichomonas 2008  . Gonorrhea 2008  . Anemia     has required blood transfusion  . Urinary tract infection   . Heart murmur     as child  . Abnormal pap 2009    Colpo - unknown results  . Gallstones   . Depression   . H/O varicella    Past Surgical History  Procedure Date  . Colposcopy   . Dilation and curettage of uterus     required blood transfusion   History   Social History  . Marital Status: Married    Spouse Name: N/A    Number of Children: N/A  . Years of Education: N/A   Occupational History  . Not on file.   Social History Main Topics  . Smoking status: Former Smoker -- 0.1 packs/day for 10 years    Types: Cigarettes    Quit date: 08/05/2009  . Smokeless tobacco: Never Used   Comment: quit with preg  . Alcohol Use: 0.0 oz/week     prior to pregnancy  . Drug Use: Yes    Special: MDMA (Ecstacy), Cocaine     prior to this pregnancy in 2009  . Sexually Active: Yes    Birth Control/ Protection: None   Other Topics Concern  . Not on file   Social History Narrative  . No narrative on file   Current Facility-Administered Medications on File Prior to Encounter  Medication Dose Route Frequency Provider Last Rate Last Dose  . lidocaine (XYLOCAINE) 1 %  injection    PRN Velna Hatchet, MD   5 mL at 05/22/12 0612   No current outpatient prescriptions on file prior to encounter.   Allergies  Allergen Reactions  . Nubain (Nalbuphine Hcl) Swelling and Palpitations    ROS: Pertinent items in HPI  OBJECTIVE Blood pressure 113/66, pulse 86, temperature 100.1 F (37.8 C), temperature source Oral, resp. rate 20, height 5\' 4"  (1.626 m), weight 74.39 kg (164 lb), last menstrual period 08/27/2011, SpO2 100.00%, currently breastfeeding.  GENERAL: Well-developed, well-nourished female in no acute distress.  HEENT: Normocephalic, good dentition HEART: normal rate RESP: normal effort BREASTS: Bilateral severe engorgement and tenderness. Nipples swollen with no cracking, bleeding or evident blocked ducts. Left breast has erythema extending from areola throughout breast from 9-12 o'clock and both have mild erythema cicumscribing areolae. Both leaking milk, no purulence.  ABDOMEN: Soft, nontender EXTREMITIES: Nontender, no edema NEURO: Alert and oriented     LAB RESULTS  Results for orders placed during the hospital encounter of 05/26/12 (from the past 24 hour(s))  URINALYSIS, ROUTINE W REFLEX MICROSCOPIC     Status: Abnormal   Collection Time   05/26/12  1:26 AM      Component Value Range   Color, Urine YELLOW  YELLOW   APPearance CLEAR  CLEAR   Specific Gravity, Urine 1.020  1.005 - 1.030   pH 6.5  5.0 - 8.0   Glucose, UA NEGATIVE  NEGATIVE mg/dL   Hgb urine dipstick LARGE (*) NEGATIVE   Bilirubin Urine NEGATIVE  NEGATIVE   Ketones, ur NEGATIVE  NEGATIVE mg/dL   Protein, ur NEGATIVE  NEGATIVE mg/dL   Urobilinogen, UA 2.0 (*) 0.0 - 1.0 mg/dL   Nitrite NEGATIVE  NEGATIVE   Leukocytes, UA TRACE (*) NEGATIVE  URINE MICROSCOPIC-ADD ON     Status: Normal   Collection Time   05/26/12  1:26 AM      Component Value Range   Squamous Epithelial / LPF RARE  RARE   WBC, UA 0-2  <3 WBC/hpf   RBC / HPF 21-50  <3 RBC/hpf  CBC WITH DIFFERENTIAL      Status: Abnormal   Collection Time   05/26/12  3:14 AM      Component Value Range   WBC 7.8  4.0 - 10.5 K/uL   RBC 3.82 (*) 3.87 - 5.11 MIL/uL   Hemoglobin 10.0 (*) 12.0 - 15.0 g/dL   HCT 96.0 (*) 45.4 - 09.8 %   MCV 77.7 (*) 78.0 - 100.0 fL   MCH 26.2  26.0 - 34.0 pg   MCHC 33.7  30.0 - 36.0 g/dL   RDW 11.9 (*) 14.7 - 82.9 %   Platelets 267  150 - 400 K/uL   Neutrophils Relative 76  43 - 77 %   Neutro Abs 5.9  1.7 - 7.7 K/uL   Lymphocytes Relative 15  12 - 46 %   Lymphs Abs 1.2  0.7 - 4.0 K/uL   Monocytes Relative 7  3 - 12 %   Monocytes Absolute 0.5  0.1 - 1.0 K/uL   Eosinophils Relative 1  0 - 5 %   Eosinophils Absolute 0.1  0.0 - 0.7 K/uL   Basophils Relative 0  0 - 1 %   Basophils Absolute 0.0  0.0 - 0.1 K/uL   Acetaminophen 1000mg  po given in MAU.    ASSESSMENT  1. Nonpurulent mastitis associated with lactation   Left mastitis and bilat severe engorgement  PLAN  Medication List  As of 05/26/2012  3:53 AM   TAKE these medications         dicloxacillin 500 MG capsule   Commonly known as: DYNAPEN   Take 1 capsule (500 mg total) by mouth 4 (four) times daily.           Advised round the clock acetaminophen in maximum dosages.  Handout from [facility] on management of breast problems and Epic instructions reviewed. Massage affected area toward nipple while nursing.  Call [facility] in am to F/U.     POE,DEIRDRE 05/26/2012 3:11 AM

## 2012-05-26 NOTE — MAU Note (Signed)
Pt states she had vaginal delivery 08/15 and today started running a fever at home of 100.6, rt lower quad pain since yesterday. Breast and bottle. Denies problems with bleeding, states it is slowing down

## 2012-07-26 ENCOUNTER — Emergency Department (HOSPITAL_COMMUNITY): Payer: Medicare Other

## 2012-07-26 ENCOUNTER — Emergency Department (HOSPITAL_COMMUNITY)
Admission: EM | Admit: 2012-07-26 | Discharge: 2012-07-26 | Disposition: A | Discharge status: Home / Self Care | Payer: Medicare Other | Attending: Emergency Medicine | Admitting: Emergency Medicine

## 2012-07-26 ENCOUNTER — Encounter (HOSPITAL_COMMUNITY): Payer: Self-pay | Admitting: Emergency Medicine

## 2012-07-26 DIAGNOSIS — F329 Major depressive disorder, single episode, unspecified: Secondary | ICD-10-CM | POA: Insufficient documentation

## 2012-07-26 DIAGNOSIS — R109 Unspecified abdominal pain: Secondary | ICD-10-CM

## 2012-07-26 DIAGNOSIS — F3289 Other specified depressive episodes: Secondary | ICD-10-CM | POA: Insufficient documentation

## 2012-07-26 DIAGNOSIS — R1013 Epigastric pain: Secondary | ICD-10-CM | POA: Insufficient documentation

## 2012-07-26 DIAGNOSIS — D573 Sickle-cell trait: Secondary | ICD-10-CM | POA: Insufficient documentation

## 2012-07-26 DIAGNOSIS — K8020 Calculus of gallbladder without cholecystitis without obstruction: Secondary | ICD-10-CM | POA: Insufficient documentation

## 2012-07-26 DIAGNOSIS — F17200 Nicotine dependence, unspecified, uncomplicated: Secondary | ICD-10-CM | POA: Insufficient documentation

## 2012-07-26 LAB — CBC WITH DIFFERENTIAL/PLATELET
Basophils Absolute: 0 10*3/uL (ref 0.0–0.1)
Eosinophils Relative: 1 % (ref 0–5)
HCT: 31.9 % — ABNORMAL LOW (ref 36.0–46.0)
Hemoglobin: 11.4 g/dL — ABNORMAL LOW (ref 12.0–15.0)
Lymphocytes Relative: 21 % (ref 12–46)
Monocytes Relative: 5 % (ref 3–12)
Neutro Abs: 8 10*3/uL — ABNORMAL HIGH (ref 1.7–7.7)
RBC: 4.28 MIL/uL (ref 3.87–5.11)
RDW: 14.7 % (ref 11.5–15.5)
WBC: 11 10*3/uL — ABNORMAL HIGH (ref 4.0–10.5)

## 2012-07-26 LAB — LIPASE, BLOOD: Lipase: 34 U/L (ref 11–59)

## 2012-07-26 LAB — COMPREHENSIVE METABOLIC PANEL
AST: 49 U/L — ABNORMAL HIGH (ref 0–37)
Albumin: 3.7 g/dL (ref 3.5–5.2)
Alkaline Phosphatase: 78 U/L (ref 39–117)
BUN: 10 mg/dL (ref 6–23)
CO2: 24 mEq/L (ref 19–32)
Chloride: 100 mEq/L (ref 96–112)
Potassium: 4.5 mEq/L (ref 3.5–5.1)
Total Bilirubin: 0.3 mg/dL (ref 0.3–1.2)

## 2012-07-26 LAB — URINALYSIS, MICROSCOPIC ONLY
Glucose, UA: NEGATIVE mg/dL
Ketones, ur: NEGATIVE mg/dL
Protein, ur: NEGATIVE mg/dL

## 2012-07-26 MED ORDER — SODIUM CHLORIDE 0.9 % IV BOLUS (SEPSIS)
1000.0000 mL | Freq: Once | INTRAVENOUS | Status: AC
  Administered 2012-07-26: 1000 mL via INTRAVENOUS

## 2012-07-26 MED ORDER — HYDROMORPHONE HCL PF 1 MG/ML IJ SOLN
1.0000 mg | Freq: Once | INTRAMUSCULAR | Status: AC
  Administered 2012-07-26: 1 mg via INTRAVENOUS
  Filled 2012-07-26: qty 1

## 2012-07-26 MED ORDER — HYDROCODONE-ACETAMINOPHEN 5-325 MG PO TABS: 1.0000 | ORAL_TABLET | ORAL | Status: DC | PRN

## 2012-07-26 MED ORDER — ONDANSETRON HCL 4 MG/2ML IJ SOLN
4.0000 mg | Freq: Once | INTRAMUSCULAR | Status: AC
  Administered 2012-07-26: 4 mg via INTRAVENOUS
  Filled 2012-07-26: qty 2

## 2012-07-26 NOTE — ED Notes (Signed)
Patient transported to Ultrasound 

## 2012-07-26 NOTE — ED Provider Notes (Signed)
History     CSN: [id number]  Arrival date & time 07/26/12  0157   First MD Initiated Contact with Patient 07/26/12 0231      Chief Complaint  Patient presents with  . Abdominal Pain   HPI  History provided by the patient and significant other. Patient is a 26 year old female with prior history of gallstones who presents with complaints of epigastric and upper abdominal pain that began around 11 PM. Pain makes a band around the upper abdomen and now is radiating to the right back. Pain has been persistent. She denies any aggravating or alleviating factors. Pain is not made worse by deep breathing. Pain is somewhat similar to previous gallstone symptoms over a year ago. She states at that time pain seemed much worse with nausea vomiting. She denies nausea or vomiting today. Denies any fever, chills or sweats. Patient does report having some recent soft loose stools twice a day for the past week. Denies any constipation. Denies any dysuria, hematuria, urinary frequency or flank pain. Denies any vaginal bleeding or vaginal discharge. Patient recently finished her menstrual cycle yesterday.    Past Medical History  Diagnosis Date  . Migraine headache   . Ovarian cyst   . Sickle cell trait   . Gallstones 2012  . Abnormal Pap smear   . Chlamydia 2008  . Trichomonas 2008  . Gonorrhea 2008  . Anemia     has required blood transfusion  . Urinary tract infection   . Heart murmur     as child  . Abnormal pap 2009    Colpo - unknown results  . Gallstones   . Depression   . H/O varicella     Past Surgical History  Procedure Date  . Colposcopy   . Dilation and curettage of uterus     required blood transfusion    Family History  Problem Relation Age of Onset  . Diabetes Mother   . Hypertension Mother   . Hepatitis Mother   . Cancer Mother 71    Breast, cervical  . Diabetes Maternal Grandmother   . Hypertension Maternal Grandmother   . Cancer Maternal Grandmother 40   Breast  . Anesthesia problems Neg Hx   . Other Neg Hx     History  Substance Use Topics  . Smoking status: Current Some Day Smoker -- 0.1 packs/day for 10 years    Types: Cigarettes    Last Attempt to Quit: 08/05/2009  . Smokeless tobacco: Never Used   Comment: quit with preg  . Alcohol Use: 0.0 oz/week     occ    OB History    Grav Para Term Preterm Abortions TAB SAB Ect Mult Living   5 4 4  0 1 0 1 0 0 4      Review of Systems  Constitutional: Negative for fever, chills and appetite change.  Respiratory: Negative for cough.   Cardiovascular: Negative for chest pain.  Gastrointestinal: Positive for abdominal pain and diarrhea. Negative for nausea, vomiting and constipation.  Genitourinary: Negative for dysuria, frequency, hematuria, flank pain, vaginal bleeding and vaginal discharge.    Allergies  Nubain  Home Medications   Current Outpatient Rx  Name Route Sig Dispense Refill  . CALCIUM CARBONATE ANTACID 500 MG PO CHEW Oral Chew 1 tablet by mouth daily as needed. For heartburn    . GUAIFENESIN 100 MG/5ML PO LIQD Oral Take 200 mg by mouth 3 (three) times daily as needed. For cough    . MEDROXYPROGESTERONE ACETATE  150 MG/ML IM SUSP Intramuscular Inject 150 mg into the muscle every 3 (three) months.    [name] PRENATAL MULTIVITAMIN CH Oral Take 1 tablet by mouth daily.      BP 125/81  Pulse 70  Temp 98.6 F (37 C) (Oral)  Resp 18  SpO2 99%  LMP 07/19/2012  Physical Exam  Nursing note and vitals reviewed. Constitutional: She is oriented to person, place, and time. She appears well-developed and well-nourished. No distress.  HENT:  Head: Normocephalic.  Cardiovascular: Normal rate and regular rhythm.   Pulmonary/Chest: Effort normal and breath sounds normal. No respiratory distress. She has no wheezes. She has no rales.  Abdominal: Soft. There is no hepatosplenomegaly. There is tenderness in the right upper quadrant and epigastric area. There is positive Murphy's sign.  There is no rigidity, no rebound, no guarding and no CVA tenderness.  Neurological: She is alert and oriented to person, place, and time.  Skin: Skin is warm and dry.  Psychiatric: She has a normal mood and affect. Her behavior is normal.    ED Course  Procedures   Results for orders placed during the hospital encounter of 07/26/12  CBC WITH DIFFERENTIAL      Component Value Range   WBC 11.0 (*) 4.0 - 10.5 K/uL   RBC 4.28  3.87 - 5.11 MIL/uL   Hemoglobin 11.4 (*) 12.0 - 15.0 g/dL   HCT 16.1 (*) 09.6 - 04.5 %   MCV 74.5 (*) 78.0 - 100.0 fL   MCH 26.6  26.0 - 34.0 pg   MCHC 35.7  30.0 - 36.0 g/dL   RDW 40.9  81.1 - 91.4 %   Platelets 373  150 - 400 K/uL   Neutrophils Relative 73  43 - 77 %   Lymphocytes Relative 21  12 - 46 %   Monocytes Relative 5  3 - 12 %   Eosinophils Relative 1  0 - 5 %   Basophils Relative 0  0 - 1 %   Neutro Abs 8.0 (*) 1.7 - 7.7 K/uL   Lymphs Abs 2.3  0.7 - 4.0 K/uL   Monocytes Absolute 0.6  0.1 - 1.0 K/uL   Eosinophils Absolute 0.1  0.0 - 0.7 K/uL   Basophils Absolute 0.0  0.0 - 0.1 K/uL   Smear Review MORPHOLOGY UNREMARKABLE    COMPREHENSIVE METABOLIC PANEL      Component Value Range   Sodium 135  135 - 145 mEq/L   Potassium 4.5  3.5 - 5.1 mEq/L   Chloride 100  96 - 112 mEq/L   CO2 24  19 - 32 mEq/L   Glucose, Bld 116 (*) 70 - 99 mg/dL   BUN 10  6 - 23 mg/dL   Creatinine, Ser 7.82  0.50 - 1.10 mg/dL   Calcium 9.5  8.4 - 95.6 mg/dL   Total Protein 7.4  6.0 - 8.3 g/dL   Albumin 3.7  3.5 - 5.2 g/dL   AST 49 (*) 0 - 37 U/L   ALT 32  0 - 35 U/L   Alkaline Phosphatase 78  39 - 117 U/L   Total Bilirubin 0.3  0.3 - 1.2 mg/dL   GFR calc non Af Amer >90  >90 mL/min   GFR calc Af Amer >90  >90 mL/min  LIPASE, BLOOD      Component Value Range   Lipase 34  11 - 59 U/L      US Abdomen Complete  07/26/2012  *RADIOLOGY REPORT*  Clinical Data:  Epigastric pain.  COMPLETE ABDOMINAL ULTRASOUND  Comparison:  05/25/2011  Findings:  Gallbladder:  Tiny  stones layering in the dependent portion of the gallbladder.  No sludge, gallbladder wall thickening, or edema. Murphy's sign is negative.  Common bile duct:  Normal caliber, measuring up to 6 mm diameter.  Liver:  No focal lesion identified.  Within normal limits in parenchymal echogenicity.  IVC:  Appears normal.  Pancreas:  No focal abnormality seen.  Spleen:  Spleen length measures 12 cm.  Normal parenchymal echotexture.  Right Kidney:  Right kidney measures 12.3 cm length.  No hydronephrosis.  Left Kidney:  Left kidney measures 12.8 cm length.  No hydronephrosis.  Abdominal aorta:  No aneurysm identified.  IMPRESSION: Tiny layering stones in the dependent portion of the gallbladder. The examination is otherwise unremarkable.   Original Report Authenticated By: Marlon Pel, M.D.      1. Abdominal pain   2. Cholelithiasis       MDM  Patient seen and evaluated. Patient appears in moderate discomfort but no acute distress. Patient with previous history of gallstones. No history of abdominal surgery. Will begin biliary workup   Patient reports feeling significantly better after pain medication and nausea medication. She has been up and bleach in the bathroom. She feels slightly lightheaded after medicines otherwise feels much better.  Ultrasound shows small gallstones and sludge. No signs for acute cholecystitis. Patient continues to feel asymptomatic at this time. We'll plan to give her surgery referral. She has been given strict return precautions.   Angus Seller, [place] 07/26/12 [phone]

## 2012-07-26 NOTE — ED Provider Notes (Signed)
Medical screening examination/treatment/procedure(s) were performed by non-physician practitioner and as supervising physician I was immediately available for consultation/collaboration.   Catrell Morrone L Dajsha Massaro, MD 07/26/12 0645 

## 2012-07-26 NOTE — ED Notes (Signed)
Pt states she is having sharp pain in her upper abdomen that radiates around to her back  Pt states pain started around 2300  Pt denies N/V/D

## 2012-10-26 ENCOUNTER — Emergency Department (HOSPITAL_COMMUNITY)
Admission: EM | Admit: 2012-10-26 | Discharge: 2012-10-26 | Discharge status: Against Medical Advice | Payer: Medicare Other | Attending: Emergency Medicine | Admitting: Emergency Medicine

## 2012-10-26 DIAGNOSIS — R109 Unspecified abdominal pain: Secondary | ICD-10-CM | POA: Insufficient documentation

## 2012-10-26 DIAGNOSIS — F17200 Nicotine dependence, unspecified, uncomplicated: Secondary | ICD-10-CM | POA: Insufficient documentation

## 2012-10-26 LAB — BASIC METABOLIC PANEL
Calcium: 9.8 mg/dL (ref 8.4–10.5)
GFR calc Af Amer: >90 mL/min (ref >90)
GFR calc non Af Amer: >90 mL/min (ref >90)
Potassium: 3.7 mEq/L (ref 3.5–5.1)
Sodium: 136 mEq/L (ref 135–145)

## 2012-10-26 LAB — CBC
MCH: 26.4 pg (ref 26.0–34.0)
MCHC: 34.9 g/dL (ref 30.0–36.0)
Platelets: 430 10*3/uL — ABNORMAL HIGH (ref 150–400)

## 2012-10-26 NOTE — ED Notes (Signed)
Pt c/o lower abdominal pain with nausea and diarrhea since this morning. Pt describes the pain as sharp and intermittent to lower abdomen. Pt states she took some Tums at about 1900 with some relief.

## 2012-10-26 NOTE — ED Notes (Signed)
Pt has decided to leave, will follow up with PCP or here tomorrow if s/s persist

## 2012-11-07 ENCOUNTER — Encounter (HOSPITAL_COMMUNITY): Payer: Self-pay | Admitting: *Deleted

## 2012-11-07 ENCOUNTER — Emergency Department (HOSPITAL_COMMUNITY)
Admission: EM | Admit: 2012-11-07 | Discharge: 2012-11-08 | Disposition: A | Discharge status: Home / Self Care | Payer: Medicare Other | Attending: Emergency Medicine | Admitting: Emergency Medicine

## 2012-11-07 DIAGNOSIS — R011 Cardiac murmur, unspecified: Secondary | ICD-10-CM | POA: Insufficient documentation

## 2012-11-07 DIAGNOSIS — Z87440 Personal history of urinary (tract) infections: Secondary | ICD-10-CM | POA: Insufficient documentation

## 2012-11-07 DIAGNOSIS — Z8659 Personal history of other mental and behavioral disorders: Secondary | ICD-10-CM | POA: Insufficient documentation

## 2012-11-07 DIAGNOSIS — Z8679 Personal history of other diseases of the circulatory system: Secondary | ICD-10-CM | POA: Insufficient documentation

## 2012-11-07 DIAGNOSIS — Z8619 Personal history of other infectious and parasitic diseases: Secondary | ICD-10-CM | POA: Insufficient documentation

## 2012-11-07 DIAGNOSIS — Z862 Personal history of diseases of the blood and blood-forming organs and certain disorders involving the immune mechanism: Secondary | ICD-10-CM | POA: Insufficient documentation

## 2012-11-07 DIAGNOSIS — B9789 Other viral agents as the cause of diseases classified elsewhere: Secondary | ICD-10-CM | POA: Insufficient documentation

## 2012-11-07 DIAGNOSIS — B349 Viral infection, unspecified: Secondary | ICD-10-CM

## 2012-11-07 DIAGNOSIS — Z8742 Personal history of other diseases of the female genital tract: Secondary | ICD-10-CM | POA: Insufficient documentation

## 2012-11-07 DIAGNOSIS — Z87891 Personal history of nicotine dependence: Secondary | ICD-10-CM | POA: Insufficient documentation

## 2012-11-07 DIAGNOSIS — Z8719 Personal history of other diseases of the digestive system: Secondary | ICD-10-CM | POA: Insufficient documentation

## 2012-11-07 NOTE — ED Notes (Signed)
Pt c/o cough, sinus congestion and chest discomfort w/ coughing x 3 days. Pt has 2 children w/ flu. Pt states she had a flu shot this year.

## 2012-11-08 MED ORDER — ALBUTEROL SULFATE HFA 108 (90 BASE) MCG/ACT IN AERS
2.0000 | INHALATION_SPRAY | Freq: Once | RESPIRATORY_TRACT | Status: AC
  Administered 2012-11-08: 2 via RESPIRATORY_TRACT
  Filled 2012-11-08: qty 6.7

## 2012-11-08 NOTE — ED Provider Notes (Addendum)
History     CSN: [id number]  Arrival date & time 11/07/12  2341   First MD Initiated Contact with Patient 11/08/12 [phone]      Chief Complaint  Patient presents with  . Cough    (Consider location/radiation/quality/duration/timing/severity/associated sxs/prior treatment) HPI Comments:     Pt c/o cough, sinus congestion and chest discomfort w/ coughing x 3 days. Pt has 2 children w/ flu. Pt states she had a flu shot this year.  Has tried over-the-counter cough medication without relief            The history is provided by the patient.    Past Medical History  Diagnosis Date  . Migraine headache   . Ovarian cyst   . Sickle cell trait   . Gallstones 2012  . Abnormal Pap smear   . Chlamydia 2008  . Trichomonas 2008  . Gonorrhea 2008  . Anemia     has required blood transfusion  . Urinary tract infection   . Heart murmur     as child  . Abnormal pap 2009    Colpo - unknown results  . Gallstones   . Depression   . H/O varicella     Past Surgical History  Procedure Date  . Colposcopy   . Dilation and curettage of uterus     required blood transfusion    Family History  Problem Relation Age of Onset  . Diabetes Mother   . Hypertension Mother   . Hepatitis Mother   . Cancer Mother 90    Breast, cervical  . Diabetes Maternal Grandmother   . Hypertension Maternal Grandmother   . Cancer Maternal Grandmother 40    Breast  . Anesthesia problems Neg Hx   . Other Neg Hx     History  Substance Use Topics  . Smoking status: Former Smoker -- 0.1 packs/day for 10 years    Types: Cigarettes    Quit date: 11/05/2010  . Smokeless tobacco: Never Used     Comment: quit with preg  . Alcohol Use: 0.0 oz/week     Comment: occ    OB History    Grav Para Term Preterm Abortions TAB SAB Ect Mult Living   5 4 4  0 1 0 1 0 0 4      Review of Systems  Constitutional: Negative for fever and chills.  HENT: Negative for congestion and rhinorrhea.   Respiratory:  Positive for cough. Negative for shortness of breath.   Cardiovascular: Negative for chest pain.  Gastrointestinal: Negative for nausea.  Skin: Negative for wound.  Neurological: Negative for dizziness, weakness and headaches.    Allergies  Nubain  Home Medications   Current Outpatient Rx  Name  Route  Sig  Dispense  Refill  . MEDROXYPROGESTERONE ACETATE 150 MG/ML IM SUSP   Intramuscular   Inject 150 mg into the muscle every 3 (three) months.           BP 116/80  Pulse 87  Temp 98.6 F (37 C) (Oral)  Resp 20  SpO2 98%  LMP 11/01/2012  Breastfeeding? No  Physical Exam  Constitutional: She appears well-developed and well-nourished.  HENT:  Head: Normocephalic.  Eyes: Pupils are equal, round, and reactive to light.  Neck: Normal range of motion.  Cardiovascular: Normal rate.   Pulmonary/Chest: Effort normal and breath sounds normal. She has no wheezes.  Musculoskeletal: Normal range of motion.  Neurological: She is alert.  Skin: Skin is warm.    ED Course  Procedures (including critical care time)  Labs Reviewed - No data to display No results found.   1. Viral syndrome       MDM  Patient reports improvement after albuterol neb         Arman Filter, NP 11/08/12 0147  Arman Filter, NP 11/18/12 2150

## 2012-11-08 NOTE — ED Notes (Signed)
Patient is alert and oriented x3.  She was given DC instructions and follow up visit instructions.  Patient gave verbal understanding. She was DC ambulatory under her own power to home.  V/S stable.  sHe was not showing any signs of distress on DC 

## 2012-11-09 NOTE — ED Provider Notes (Signed)
Medical screening examination/treatment/procedure(s) were performed by non-physician practitioner and as supervising physician I was immediately available for consultation/collaboration.   Laray Anger, DO 11/09/12 1905

## 2012-11-19 NOTE — ED Provider Notes (Signed)
Medical screening examination/treatment/procedure(s) were performed by non-physician practitioner and as supervising physician I was immediately available for consultation/collaboration.    Laray Anger, DO 11/19/12 1542

## 2013-05-06 ENCOUNTER — Encounter (HOSPITAL_COMMUNITY): Payer: Self-pay | Admitting: *Deleted

## 2013-05-06 ENCOUNTER — Emergency Department (HOSPITAL_COMMUNITY)
Admission: EM | Admit: 2013-05-06 | Discharge: 2013-05-06 | Disposition: A | Discharge status: Home / Self Care | Payer: Medicare Other | Attending: Emergency Medicine | Admitting: Emergency Medicine

## 2013-05-06 DIAGNOSIS — F329 Major depressive disorder, single episode, unspecified: Secondary | ICD-10-CM | POA: Insufficient documentation

## 2013-05-06 DIAGNOSIS — Z8719 Personal history of other diseases of the digestive system: Secondary | ICD-10-CM | POA: Insufficient documentation

## 2013-05-06 DIAGNOSIS — Z8619 Personal history of other infectious and parasitic diseases: Secondary | ICD-10-CM | POA: Insufficient documentation

## 2013-05-06 DIAGNOSIS — R109 Unspecified abdominal pain: Secondary | ICD-10-CM

## 2013-05-06 DIAGNOSIS — Z3202 Encounter for pregnancy test, result negative: Secondary | ICD-10-CM | POA: Insufficient documentation

## 2013-05-06 DIAGNOSIS — Z87891 Personal history of nicotine dependence: Secondary | ICD-10-CM | POA: Insufficient documentation

## 2013-05-06 DIAGNOSIS — Z8679 Personal history of other diseases of the circulatory system: Secondary | ICD-10-CM | POA: Insufficient documentation

## 2013-05-06 DIAGNOSIS — R1013 Epigastric pain: Secondary | ICD-10-CM | POA: Insufficient documentation

## 2013-05-06 DIAGNOSIS — D573 Sickle-cell trait: Secondary | ICD-10-CM | POA: Insufficient documentation

## 2013-05-06 DIAGNOSIS — F3289 Other specified depressive episodes: Secondary | ICD-10-CM | POA: Insufficient documentation

## 2013-05-06 LAB — URINALYSIS, ROUTINE W REFLEX MICROSCOPIC
Leukocytes, UA: NEGATIVE
Nitrite: NEGATIVE
Protein, ur: NEGATIVE mg/dL
Specific Gravity, Urine: 1.031 — ABNORMAL HIGH (ref 1.005–1.030)
Urobilinogen, UA: 4 mg/dL — ABNORMAL HIGH (ref 0.0–1.0)

## 2013-05-06 LAB — PREGNANCY, URINE: Preg Test, Ur: NEGATIVE

## 2013-05-06 LAB — COMPREHENSIVE METABOLIC PANEL
AST: 13 U/L (ref 0–37)
Albumin: 3.5 g/dL (ref 3.5–5.2)
Calcium: 9.3 mg/dL (ref 8.4–10.5)
Chloride: 101 mEq/L (ref 96–112)
Creatinine, Ser: 0.47 mg/dL — ABNORMAL LOW (ref 0.50–1.10)
Sodium: 137 mEq/L (ref 135–145)
Total Bilirubin: 0.3 mg/dL (ref 0.3–1.2)

## 2013-05-06 LAB — CBC WITH DIFFERENTIAL/PLATELET
Basophils Absolute: 0 10*3/uL (ref 0.0–0.1)
Basophils Relative: 0 % (ref 0–1)
HCT: 32 % — ABNORMAL LOW (ref 36.0–46.0)
MCHC: 34.4 g/dL (ref 30.0–36.0)
Monocytes Absolute: 0.5 10*3/uL (ref 0.1–1.0)
Neutro Abs: 5.4 10*3/uL (ref 1.7–7.7)
Neutrophils Relative %: 67 % (ref 43–77)
Platelets: 393 10*3/uL (ref 150–400)
RDW: 13.8 % (ref 11.5–15.5)

## 2013-05-06 MED ORDER — GI COCKTAIL ~~LOC~~
30.0000 mL | Freq: Once | ORAL | Status: AC
  Administered 2013-05-06: 30 mL via ORAL
  Filled 2013-05-06: qty 30

## 2013-05-06 NOTE — ED Provider Notes (Signed)
CSN: [id number]     Arrival date & time 05/06/13  1555 History     First MD Initiated Contact with Patient 05/06/13 1645     Chief Complaint  Patient presents with  . Abdominal Pain   (Consider location/radiation/quality/duration/timing/severity/associated sxs/prior Treatment) HPI Comments: Patient is a 27 year old female with history of gallstones who presents today with epigastric pain. It is a sharp pain that comes intermittently. It comes about 1x an hour and the patient cannot find a trigger. When the pain comes it only lasts for a few seconds and resolves spontaneously.The pain sometimes migrates through her stomach but does not radiate. She reports that since yesterday she has been drinking a large amount of soda. She ate french fries to try to make her pain feel better. This did not affect her pain. Last BM was today. She states it was a normal, nonbloody BM. No nausea, vomiting, diarrhea, constipation, fevers, chills, dysuria, hematuria, urinary urgency, urinary frequency, or vaginal discharge.   The history is provided by the patient. No language interpreter was used.    Past Medical History  Diagnosis Date  . Migraine headache   . Ovarian cyst   . Sickle cell trait   . Gallstones 2012  . Abnormal Pap smear   . Chlamydia 2008  . Trichomonas 2008  . Gonorrhea 2008  . Anemia     has required blood transfusion  . Urinary tract infection   . Heart murmur     as child  . Abnormal pap 2009    Colpo - unknown results  . Gallstones   . Depression   . H/O varicella    Past Surgical History  Procedure Laterality Date  . Colposcopy    . Dilation and curettage of uterus      required blood transfusion   Family History  Problem Relation Age of Onset  . Diabetes Mother   . Hypertension Mother   . Hepatitis Mother   . Cancer Mother 82    Breast, cervical  . Diabetes Maternal Grandmother   . Hypertension Maternal Grandmother   . Cancer Maternal Grandmother 40    Breast   . Anesthesia problems Neg Hx   . Other Neg Hx    History  Substance Use Topics  . Smoking status: Former Smoker -- 0.10 packs/day for 10 years    Types: Cigarettes    Quit date: 11/05/2010  . Smokeless tobacco: Never Used     Comment: quit with preg  . Alcohol Use: 0.0 oz/week     Comment: occ   OB History   Grav Para Term Preterm Abortions TAB SAB Ect Mult Living   5 4 4  0 1 0 1 0 0 4     Review of Systems  Constitutional: Negative for fever and chills.  Respiratory: Negative for shortness of breath.   Cardiovascular: Negative for chest pain.  Gastrointestinal: Positive for abdominal pain. Negative for nausea, vomiting, diarrhea, constipation, blood in stool and abdominal distention.  Genitourinary: Negative for dysuria, urgency, vaginal discharge, difficulty urinating and pelvic pain.  All other systems reviewed and are negative.    Allergies  Nubain  Home Medications   Current Outpatient Rx  Name  Route  Sig  Dispense  Refill  . calcium carbonate (TUMS - DOSED IN MG ELEMENTAL CALCIUM) 500 MG chewable tablet   Oral   Chew 1-3 tablets by mouth 3 (three) times daily as needed for heartburn.  BP 115/73  Pulse 89  Temp(Src) 98.8 F (37.1 C) (Oral)  Resp 18  SpO2 99% Physical Exam  Nursing note and vitals reviewed. Constitutional: She is oriented to person, place, and time. She appears well-developed and well-nourished. No distress.  HENT:  Head: Normocephalic and atraumatic.  Right Ear: External ear normal.  Left Ear: External ear normal.  Nose: Nose normal.  Mouth/Throat: Oropharynx is clear and moist.  Eyes: Conjunctivae are normal.  Neck: Normal range of motion.  Cardiovascular: Normal rate, regular rhythm and normal heart sounds.   Pulmonary/Chest: Effort normal and breath sounds normal. No stridor. No respiratory distress. She has no wheezes. She has no rales.  Abdominal: Soft. She exhibits no distension. There is no tenderness. There is no  rigidity, no rebound, no guarding, no CVA tenderness, no tenderness at McBurney's point and negative Murphy's sign.  No tenderness to deep palpation.   Musculoskeletal: Normal range of motion.  Neurological: She is alert and oriented to person, place, and time. She has normal strength.  Skin: Skin is warm and dry. She is not diaphoretic. No erythema.  Psychiatric: She has a normal mood and affect. Her behavior is normal.    ED Course   Procedures (including critical care time)  Labs Reviewed  URINALYSIS, ROUTINE W REFLEX MICROSCOPIC - Abnormal; Notable for the following:    Specific Gravity, Urine 1.031 (*)    Urobilinogen, UA 4.0 (*)    All other components within normal limits  CBC WITH DIFFERENTIAL - Abnormal; Notable for the following:    Hemoglobin 11.0 (*)    HCT 32.0 (*)    MCV 75.8 (*)    All other components within normal limits  COMPREHENSIVE METABOLIC PANEL - Abnormal; Notable for the following:    Glucose, Bld 104 (*)    Creatinine, Ser 0.47 (*)    All other components within normal limits  PREGNANCY, URINE  LIPASE, BLOOD   No results found. 1. Abdominal pain     MDM  Patient is nontoxic, nonseptic appearing, in no apparent distress.  Patient's pain and other symptoms adequately managed in emergency department. Labs and vitals reviewed.  Patient does not meet the SIRS or Sepsis criteria.  On repeat exam patient does not have a surgical abdomen and there are nor peritoneal signs.  No indication of appendicitis, bowel obstruction, bowel perforation, cholecystitis, diverticulitis, PID or ectopic pregnancy.  Patient has known gallstones, but was nontender during entire ED course. I do not feel as though patient needs repeat imaging at this time. Patient discharged home after discussion about proper low fat diet for gallstones and instructed to cut back on soda intake.  I have also discussed reasons to return immediately to the ER.  Patient expresses understanding and agrees  with plan. Discussed case with Dr. Fredderick Phenix who agrees with plan. Patient / Family / Caregiver informed of clinical course, understand medical decision-making process, and agree with plan.      Mora Bellman, PA-C 05/06/13 1945

## 2013-05-06 NOTE — ED Notes (Signed)
Pt reports mid-abd pain since last night.  Pt reports dizziness but denies any n/v/d at this time.  Pt reports gallstones.  Pt also denies any urinary or GYN sxs at this time.

## 2013-05-06 NOTE — ED Notes (Signed)
RUE:[id number]<JW> Expected date:<BR> Expected time:<BR> Means of arrival:<BR> Comments:<BR> Fall-hip pain, shortening and rotation

## 2013-05-06 NOTE — ED Notes (Signed)
MD at bedside. 

## 2013-05-06 NOTE — ED Provider Notes (Signed)
Medical screening examination/treatment/procedure(s) were performed by non-physician practitioner and as supervising physician I was immediately available for consultation/collaboration.   Rolan Bucco, MD 05/06/13 2330

## 2013-06-01 ENCOUNTER — Emergency Department (HOSPITAL_COMMUNITY): Payer: Medicare Other

## 2013-06-01 ENCOUNTER — Encounter (HOSPITAL_COMMUNITY): Payer: Self-pay | Admitting: Emergency Medicine

## 2013-06-01 ENCOUNTER — Emergency Department (HOSPITAL_COMMUNITY)
Admission: EM | Admit: 2013-06-01 | Discharge: 2013-06-01 | Disposition: A | Discharge status: Home / Self Care | Payer: Medicare Other | Attending: Emergency Medicine | Admitting: Emergency Medicine

## 2013-06-01 DIAGNOSIS — F3289 Other specified depressive episodes: Secondary | ICD-10-CM | POA: Insufficient documentation

## 2013-06-01 DIAGNOSIS — R209 Unspecified disturbances of skin sensation: Secondary | ICD-10-CM | POA: Insufficient documentation

## 2013-06-01 DIAGNOSIS — D573 Sickle-cell trait: Secondary | ICD-10-CM | POA: Insufficient documentation

## 2013-06-01 DIAGNOSIS — M722 Plantar fascial fibromatosis: Secondary | ICD-10-CM | POA: Insufficient documentation

## 2013-06-01 DIAGNOSIS — F329 Major depressive disorder, single episode, unspecified: Secondary | ICD-10-CM | POA: Insufficient documentation

## 2013-06-01 DIAGNOSIS — Z87891 Personal history of nicotine dependence: Secondary | ICD-10-CM | POA: Insufficient documentation

## 2013-06-01 MED ORDER — IBUPROFEN 800 MG PO TABS: 800.0000 mg | ORAL_TABLET | Freq: Three times a day (TID) | ORAL | Status: DC

## 2013-06-01 NOTE — ED Notes (Signed)
Pt states that she has pain to rt foot denies any injury states that it began last night. Minimal swelling

## 2013-06-01 NOTE — ED Provider Notes (Signed)
CSN: [id number]     Arrival date & time 06/01/13  1542 History  This chart was scribed for Junius Finner, PA working with Dagmar Hait, MD by Quintella Reichert, ED Scribe. This patient was seen in room WTR7/WTR7 and the patient's care was started at 5:06 PM.    Chief Complaint  Patient presents with  . Foot Pain    The history is provided by the patient. No language interpreter was used.    HPI Comments: Tiffany Hall is a 27 y.o. female who presents to the Emergency Department complaining of one day of constant, moderate, progressively-worsening right foot pain.  Pain began last night as a mild aching sensation.  This morning when pt took a step after getting out of bed her pain suddenly became more severe and she developed a pins-and-needle sensation to the bottom of her foot.  Today she has also noted some swelling to the foot and difficulty moving her toes.  She denies any injuries or unusual activities that may have brought on pain.  She notes that she is on her feet all day at work and did so the day her pain began.  Pt denies h/o DM to her knowledge.   Past Medical History  Diagnosis Date  . Migraine headache   . Ovarian cyst   . Sickle cell trait   . Gallstones 2012  . Abnormal Pap smear   . Chlamydia 2008  . Trichomonas 2008  . Gonorrhea 2008  . Anemia     has required blood transfusion  . Urinary tract infection   . Heart murmur     as child  . Abnormal pap 2009    Colpo - unknown results  . Gallstones   . Depression   . H/O varicella     Past Surgical History  Procedure Laterality Date  . Colposcopy    . Dilation and curettage of uterus      required blood transfusion    Family History  Problem Relation Age of Onset  . Diabetes Mother   . Hypertension Mother   . Hepatitis Mother   . Cancer Mother 39    Breast, cervical  . Diabetes Maternal Grandmother   . Hypertension Maternal Grandmother   . Cancer Maternal Grandmother 40    Breast  .  Anesthesia problems Neg Hx   . Other Neg Hx     History  Substance Use Topics  . Smoking status: Former Smoker -- 0.10 packs/day for 10 years    Types: Cigarettes    Quit date: 11/05/2010  . Smokeless tobacco: Never Used     Comment: quit with preg  . Alcohol Use: 0.0 oz/week     Comment: occ    OB History   Grav Para Term Preterm Abortions TAB SAB Ect Mult Living   5 4 4  0 1 0 1 0 0 4       Review of Systems  Musculoskeletal:       Right foot pain  All other systems reviewed and are negative.      Allergies  Nubain  Home Medications   Current Outpatient Rx  Name  Route  Sig  Dispense  Refill  . ibuprofen (ADVIL,MOTRIN) 200 MG tablet   Oral   Take 200 mg by mouth every 8 (eight) hours as needed for pain.         [name] ibuprofen (ADVIL,MOTRIN) 800 MG tablet   Oral   Take 1 tablet (800 mg total) by mouth  3 (three) times daily.   21 tablet   0    LMP 05/22/2013  Physical Exam  Nursing note and vitals reviewed. Constitutional: She is oriented to person, place, and time. She appears well-developed and well-nourished.  HENT:  Head: Normocephalic and atraumatic.  Eyes: EOM are normal.  Neck: Normal range of motion.  Cardiovascular: Normal rate.   Capillary refill <2 seconds Pedal pulses 2+  Pulmonary/Chest: Effort normal.  Musculoskeletal: Normal range of motion.       Right foot: She exhibits tenderness.  Tenderness to palpation under plantar aspect of right foot and dorsal aspect of right forefoot.  No edema, ecchymosis or erythema.  Neurological: She is alert and oriented to person, place, and time.  Skin: Skin is warm and dry.  Psychiatric: She has a normal mood and affect. Her behavior is normal.    ED Course  Procedures (including critical care time)   COORDINATION OF CARE: 5:10 PM-Informed pt that symptoms are likely due to plantar fasciitis.  Discussed home treatment plan with pt at bedside and pt agreed to plan.     Labs Reviewed - No  data to display   Dg Foot Complete Right  06/01/2013   CLINICAL DATA:  Anterior right foot pain. No injury.  EXAM: RIGHT FOOT COMPLETE - 3+ VIEW  COMPARISON:  None.  FINDINGS: There is no evidence of fracture or dislocation. There is no evidence of arthropathy or other focal bone abnormality. Soft tissues are unremarkable.  IMPRESSION: Negative.   Electronically Signed   By: Charlett Nose   On: 06/01/2013 16:50    1. Plantar fasciitis      MDM  No signs of infection.  Plain films negative.  H&P consistent with plantar fascitis.  Will tx symptomatically. Rx: ibuprofen.  Discussed with pt how to stretch plantar fascia by rolling frozen water bottle under foot 3-4x per day.  Ankle splint may aid during the day and at night to keep foot in more neutral position.  F/u [facility] if not improving.    I personally performed the services described in this documentation, which was scribed in my presence. The recorded information has been reviewed and is accurate.    Junius Finner, PA-C 06/02/13 1824

## 2013-06-04 ENCOUNTER — Encounter (HOSPITAL_COMMUNITY): Payer: Self-pay | Admitting: *Deleted

## 2013-06-04 ENCOUNTER — Emergency Department (HOSPITAL_COMMUNITY)
Admission: EM | Admit: 2013-06-04 | Discharge: 2013-06-04 | Disposition: A | Discharge status: Home / Self Care | Payer: Medicare Other | Attending: Emergency Medicine | Admitting: Emergency Medicine

## 2013-06-04 DIAGNOSIS — R599 Enlarged lymph nodes, unspecified: Secondary | ICD-10-CM | POA: Insufficient documentation

## 2013-06-04 DIAGNOSIS — Z8619 Personal history of other infectious and parasitic diseases: Secondary | ICD-10-CM | POA: Insufficient documentation

## 2013-06-04 DIAGNOSIS — R011 Cardiac murmur, unspecified: Secondary | ICD-10-CM | POA: Insufficient documentation

## 2013-06-04 DIAGNOSIS — Z87440 Personal history of urinary (tract) infections: Secondary | ICD-10-CM | POA: Insufficient documentation

## 2013-06-04 DIAGNOSIS — Z8679 Personal history of other diseases of the circulatory system: Secondary | ICD-10-CM | POA: Insufficient documentation

## 2013-06-04 DIAGNOSIS — Z87891 Personal history of nicotine dependence: Secondary | ICD-10-CM | POA: Insufficient documentation

## 2013-06-04 DIAGNOSIS — Z862 Personal history of diseases of the blood and blood-forming organs and certain disorders involving the immune mechanism: Secondary | ICD-10-CM | POA: Insufficient documentation

## 2013-06-04 DIAGNOSIS — L299 Pruritus, unspecified: Secondary | ICD-10-CM | POA: Insufficient documentation

## 2013-06-04 DIAGNOSIS — Z8742 Personal history of other diseases of the female genital tract: Secondary | ICD-10-CM | POA: Insufficient documentation

## 2013-06-04 DIAGNOSIS — Z8659 Personal history of other mental and behavioral disorders: Secondary | ICD-10-CM | POA: Insufficient documentation

## 2013-06-04 DIAGNOSIS — Z87442 Personal history of urinary calculi: Secondary | ICD-10-CM | POA: Insufficient documentation

## 2013-06-04 NOTE — ED Provider Notes (Signed)
CSN: [id number]     Arrival date & time 06/04/13  0003 History   First MD Initiated Contact with Patient 06/04/13 0011     Chief Complaint  Patient presents with  . bumps on neck    (Consider location/radiation/quality/duration/timing/severity/associated sxs/prior Treatment) HPI Comments: Since having her hair, braided.  She's noticed that she's had some swollen lymph nodes on the back of her neck.  She's also been scratching at her scalp to to the tightness of the grade and may have inadvertently caused a superficial abrasion.  Denies any fever or myalgias.  Visual disturbances, difficulty swallowing, headache  The history is provided by the patient.    Past Medical History  Diagnosis Date  . Migraine headache   . Ovarian cyst   . Sickle cell trait   . Gallstones 2012  . Abnormal Pap smear   . Chlamydia 2008  . Trichomonas 2008  . Gonorrhea 2008  . Anemia     has required blood transfusion  . Urinary tract infection   . Heart murmur     as child  . Abnormal pap 2009    Colpo - unknown results  . Gallstones   . Depression   . H/O varicella    Past Surgical History  Procedure Laterality Date  . Colposcopy    . Dilation and curettage of uterus      required blood transfusion   Family History  Problem Relation Age of Onset  . Diabetes Mother   . Hypertension Mother   . Hepatitis Mother   . Cancer Mother 35    Breast, cervical  . Diabetes Maternal Grandmother   . Hypertension Maternal Grandmother   . Cancer Maternal Grandmother 40    Breast  . Anesthesia problems Neg Hx   . Other Neg Hx    History  Substance Use Topics  . Smoking status: Former Smoker -- 0.10 packs/day for 10 years    Types: Cigarettes    Quit date: 11/05/2010  . Smokeless tobacco: Never Used     Comment: quit with preg  . Alcohol Use: 0.0 oz/week     Comment: occ   OB History   Grav Para Term Preterm Abortions TAB SAB Ect Mult Living   5 4 4  0 1 0 1 0 0 4     Review of Systems   Constitutional: Negative for fever.  HENT: Negative for sore throat and trouble swallowing.   Skin: Positive for wound.  Allergic/Immunologic: Negative for immunocompromised state.  Neurological: Negative for headaches.  All other systems reviewed and are negative.    Allergies  Nubain  Home Medications   Current Outpatient Rx  Name  Route  Sig  Dispense  Refill  . ibuprofen (ADVIL,MOTRIN) 200 MG tablet   Oral   Take 400 mg by mouth every 8 (eight) hours as needed for pain.           BP 120/67  Pulse 65  Temp(Src) 98.8 F (37.1 C) (Oral)  Resp 18  Ht 5\' 4"  (1.626 m)  Wt 164 lb 4 oz (74.503 kg)  BMI 28.18 kg/m2  SpO2 100%  LMP 05/22/2013 Physical Exam  Nursing note and vitals reviewed. Constitutional: She is oriented to person, place, and time. She appears well-developed and well-nourished.  HENT:  Head: Normocephalic.  Right Ear: External ear normal.  Left Ear: External ear normal.  Mouth/Throat: Oropharynx is clear and moist.  Eyes: Pupils are equal, round, and reactive to light.  Neck: Normal range of  motion. Neck supple.  Cardiovascular: Normal rate and regular rhythm.   Pulmonary/Chest: Effort normal and breath sounds normal.  Lymphadenopathy:    She has cervical adenopathy.       Right cervical: Posterior cervical adenopathy present.       Left cervical: Posterior cervical adenopathy present.  Patient is too small, enlarged posterior, cervical chain adenopathy on either side without erythema, or indication for cellulitis  Neurological: She is alert and oriented to person, place, and time.  Skin: Skin is warm and dry. No erythema.    ED Course  Procedures (including critical care time) Labs Review Labs Reviewed - No data to display Imaging Review No results found.  MDM   1. Lymph node enlargement     No indication for infection.  There is no nuchal rigidity.  This appears to be as a result of having her hair recently, braided.  There is new  surrounding erythema, indicating a cellulitis    Arman Filter, NP 06/04/13 0036

## 2013-06-04 NOTE — ED Provider Notes (Signed)
Medical screening examination/treatment/procedure(s) were performed by non-physician practitioner and as supervising physician I was immediately available for consultation/collaboration.   Stace Peace M Fallen Crisostomo, MD 06/04/13 0420 

## 2013-06-04 NOTE — ED Notes (Signed)
Pt states for the past week she's had "bumps" on the back of her neck, states they hurt and itch.

## 2013-06-04 NOTE — ED Provider Notes (Signed)
Medical screening examination/treatment/procedure(s) were performed by non-physician practitioner and as supervising physician I was immediately available for consultation/collaboration.   Dagmar Hait, MD 06/04/13 [phone]

## 2014-02-22 ENCOUNTER — Emergency Department (HOSPITAL_COMMUNITY)
Admission: EM | Admit: 2014-02-22 | Discharge: 2014-02-22 | Disposition: A | Discharge status: Home / Self Care | Payer: Medicare Other | Attending: Emergency Medicine | Admitting: Emergency Medicine

## 2014-02-22 ENCOUNTER — Encounter (HOSPITAL_COMMUNITY): Payer: Self-pay | Admitting: Emergency Medicine

## 2014-02-22 ENCOUNTER — Emergency Department (HOSPITAL_COMMUNITY): Payer: Medicare Other

## 2014-02-22 DIAGNOSIS — R110 Nausea: Secondary | ICD-10-CM | POA: Insufficient documentation

## 2014-02-22 DIAGNOSIS — Z792 Long term (current) use of antibiotics: Secondary | ICD-10-CM | POA: Insufficient documentation

## 2014-02-22 DIAGNOSIS — Z8742 Personal history of other diseases of the female genital tract: Secondary | ICD-10-CM | POA: Insufficient documentation

## 2014-02-22 DIAGNOSIS — M542 Cervicalgia: Secondary | ICD-10-CM | POA: Insufficient documentation

## 2014-02-22 DIAGNOSIS — R42 Dizziness and giddiness: Secondary | ICD-10-CM | POA: Insufficient documentation

## 2014-02-22 DIAGNOSIS — R51 Headache: Secondary | ICD-10-CM | POA: Insufficient documentation

## 2014-02-22 DIAGNOSIS — R52 Pain, unspecified: Secondary | ICD-10-CM | POA: Insufficient documentation

## 2014-02-22 DIAGNOSIS — R519 Headache, unspecified: Secondary | ICD-10-CM

## 2014-02-22 DIAGNOSIS — Z87891 Personal history of nicotine dependence: Secondary | ICD-10-CM | POA: Insufficient documentation

## 2014-02-22 DIAGNOSIS — Z8659 Personal history of other mental and behavioral disorders: Secondary | ICD-10-CM | POA: Insufficient documentation

## 2014-02-22 DIAGNOSIS — Z8679 Personal history of other diseases of the circulatory system: Secondary | ICD-10-CM | POA: Insufficient documentation

## 2014-02-22 DIAGNOSIS — Z862 Personal history of diseases of the blood and blood-forming organs and certain disorders involving the immune mechanism: Secondary | ICD-10-CM | POA: Insufficient documentation

## 2014-02-22 DIAGNOSIS — R5383 Other fatigue: Secondary | ICD-10-CM

## 2014-02-22 DIAGNOSIS — Z3202 Encounter for pregnancy test, result negative: Secondary | ICD-10-CM | POA: Insufficient documentation

## 2014-02-22 DIAGNOSIS — R079 Chest pain, unspecified: Secondary | ICD-10-CM | POA: Insufficient documentation

## 2014-02-22 DIAGNOSIS — R011 Cardiac murmur, unspecified: Secondary | ICD-10-CM | POA: Insufficient documentation

## 2014-02-22 DIAGNOSIS — M549 Dorsalgia, unspecified: Secondary | ICD-10-CM | POA: Insufficient documentation

## 2014-02-22 DIAGNOSIS — Z8719 Personal history of other diseases of the digestive system: Secondary | ICD-10-CM | POA: Insufficient documentation

## 2014-02-22 DIAGNOSIS — Z87440 Personal history of urinary (tract) infections: Secondary | ICD-10-CM | POA: Insufficient documentation

## 2014-02-22 DIAGNOSIS — R5381 Other malaise: Secondary | ICD-10-CM | POA: Insufficient documentation

## 2014-02-22 DIAGNOSIS — Z8619 Personal history of other infectious and parasitic diseases: Secondary | ICD-10-CM | POA: Insufficient documentation

## 2014-02-22 LAB — BASIC METABOLIC PANEL
BUN: 10 mg/dL (ref 6–23)
CO2: 25 mEq/L (ref 19–32)
Calcium: 9.4 mg/dL (ref 8.4–10.5)
Chloride: 101 mEq/L (ref 96–112)
Creatinine, Ser: 0.59 mg/dL (ref 0.50–1.10)
GFR calc Af Amer: >90 mL/min (ref >90)
GFR calc non Af Amer: >90 mL/min (ref >90)
Glucose, Bld: 97 mg/dL (ref 70–99)
Potassium: 3.9 mEq/L (ref 3.7–5.3)
Sodium: 137 mEq/L (ref 137–147)

## 2014-02-22 LAB — CBC WITH DIFFERENTIAL/PLATELET
Basophils Absolute: 0.1 10*3/uL (ref 0.0–0.1)
Basophils Relative: 1 % (ref 0–1)
Eosinophils Absolute: 0.2 10*3/uL (ref 0.0–0.7)
Eosinophils Relative: 3 % (ref 0–5)
HCT: 30.4 % — ABNORMAL LOW (ref 36.0–46.0)
Hemoglobin: 10.3 g/dL — ABNORMAL LOW (ref 12.0–15.0)
Lymphocytes Relative: 37 % (ref 12–46)
Lymphs Abs: 2.7 10*3/uL (ref 0.7–4.0)
MCH: 24.6 pg — ABNORMAL LOW (ref 26.0–34.0)
MCHC: 33.9 g/dL (ref 30.0–36.0)
MCV: 72.6 fL — ABNORMAL LOW (ref 78.0–100.0)
Monocytes Absolute: 0.5 10*3/uL (ref 0.1–1.0)
Monocytes Relative: 6 % (ref 3–12)
Neutro Abs: 3.9 10*3/uL (ref 1.7–7.7)
Neutrophils Relative %: 53 % (ref 43–77)
Platelets: 410 10*3/uL — ABNORMAL HIGH (ref 150–400)
RBC: 4.19 MIL/uL (ref 3.87–5.11)
RDW: 17.2 % — ABNORMAL HIGH (ref 11.5–15.5)
WBC: 7.4 10*3/uL (ref 4.0–10.5)

## 2014-02-22 LAB — URINALYSIS, ROUTINE W REFLEX MICROSCOPIC
BILIRUBIN URINE: NEGATIVE
Glucose, UA: NEGATIVE mg/dL
KETONES UR: NEGATIVE mg/dL
Leukocytes, UA: NEGATIVE
NITRITE: NEGATIVE
PROTEIN: NEGATIVE mg/dL
SPECIFIC GRAVITY, URINE: 1.021 (ref 1.005–1.030)
UROBILINOGEN UA: 1 mg/dL (ref 0.0–1.0)
pH: 6 (ref 5.0–8.0)

## 2014-02-22 LAB — URINE MICROSCOPIC-ADD ON

## 2014-02-22 LAB — POC URINE PREG, ED: Preg Test, Ur: NEGATIVE

## 2014-02-22 MED ORDER — ONDANSETRON HCL 4 MG/2ML IJ SOLN: 4.0000 mg | Freq: Once | INTRAMUSCULAR | Status: DC

## 2014-02-22 MED ORDER — SODIUM CHLORIDE 0.9 % IV BOLUS (SEPSIS): 1000.0000 mL | INTRAVENOUS | Status: DC

## 2014-02-22 MED ORDER — KETOROLAC TROMETHAMINE 30 MG/ML IJ SOLN: 30.0000 mg | Freq: Once | INTRAMUSCULAR | Status: DC

## 2014-02-22 MED ORDER — ONDANSETRON 4 MG PO TBDP
4.0000 mg | ORAL_TABLET | Freq: Once | ORAL | Status: AC
  Administered 2014-02-22: 4 mg via ORAL
  Filled 2014-02-22: qty 1

## 2014-02-22 MED ORDER — IBUPROFEN 800 MG PO TABS
800.0000 mg | ORAL_TABLET | Freq: Once | ORAL | Status: AC
  Administered 2014-02-22: 800 mg via ORAL
  Filled 2014-02-22: qty 1

## 2014-02-22 NOTE — ED Notes (Signed)
Pt returned from XRAY 

## 2014-02-22 NOTE — ED Notes (Signed)
Pt's Troponin is 0.00. I have tried to get it to cross over but i cant get it. PA

## 2014-02-22 NOTE — ED Notes (Addendum)
I-stat troponin did not cross over. Mini lab tech aware.

## 2014-02-22 NOTE — ED Provider Notes (Signed)
CSN: [id number]     Arrival date & time 02/22/14  2027 History   First MD Initiated Contact with Patient 02/22/14 2134     Chief Complaint  Patient presents with  . Chest Pain     (Consider location/radiation/quality/duration/timing/severity/associated sxs/prior Treatment) HPI Pt is a 28yo female with hx of migraines, ovarian cyst, gall stones, UTIs, and depression c/o right sided chest pain that started last night. Pain is constant, waxing and waning, sharp in nature, states it feels like it is in her right breast. Denies skin changes to right breast or nipple discharge. Pt also c/o generalized body aches, mild abdominal cramping, dizziness and nausea.  Denies fever or vomiting.  Denies trauma to area. Denies urinary or vaginal symptoms. LMP 01/24/14.      Past Medical History  Diagnosis Date  . Migraine headache   . Ovarian cyst   . Sickle cell trait   . Gallstones 2012  . Abnormal Pap smear   . Chlamydia 2008  . Trichomonas 2008  . Gonorrhea 2008  . Anemia     has required blood transfusion  . Urinary tract infection   . Heart murmur     as child  . Abnormal pap 2009    Colpo - unknown results  . Gallstones   . Depression   . H/O varicella    Past Surgical History  Procedure Laterality Date  . Colposcopy    . Dilation and curettage of uterus      required blood transfusion   Family History  Problem Relation Age of Onset  . Diabetes Mother   . Hypertension Mother   . Hepatitis Mother   . Cancer Mother 65    Breast, cervical  . Diabetes Maternal Grandmother   . Hypertension Maternal Grandmother   . Cancer Maternal Grandmother 40    Breast  . Anesthesia problems Neg Hx   . Other Neg Hx    History  Substance Use Topics  . Smoking status: Former Smoker -- 0.10 packs/day for 10 years    Types: Cigarettes    Quit date: 11/05/2010  . Smokeless tobacco: Never Used     Comment: quit with preg  . Alcohol Use: 0.0 oz/week     Comment: occ   OB History   Grav  Para Term Preterm Abortions TAB SAB Ect Mult Living   5 4 4  0 1 0 1 0 0 4     Review of Systems  Constitutional: Negative for fever, chills and diaphoresis.  Respiratory: Negative for cough and shortness of breath.   Cardiovascular: Positive for chest pain. Negative for palpitations and leg swelling.  Gastrointestinal: Positive for nausea and abdominal pain ( mild cramping). Negative for vomiting, diarrhea and constipation.  Genitourinary: Negative for dysuria, hematuria, flank pain, decreased urine volume, vaginal bleeding, vaginal discharge, vaginal pain, menstrual problem and pelvic pain.  Musculoskeletal: Positive for back pain, myalgias and neck pain. Negative for neck stiffness.  Neurological: Positive for dizziness and headaches. Negative for syncope, weakness, light-headedness and numbness.  All other systems reviewed and are negative.     Allergies  Nubain  Home Medications   Prior to Admission medications   Medication Sig Start Date End Date Taking? Authorizing Provider  ciprofloxacin (CIPRO) 500 MG tablet Take 500 mg by mouth 2 (two) times daily.  02/23/14 03/02/14 Yes Historical Provider, MD  ibuprofen (ADVIL,MOTRIN) 200 MG tablet Take 400 mg by mouth every 6 (six) hours as needed for mild pain.    Yes Historical  Provider, MD   BP 118/78  Pulse 65  Temp(Src) 98.7 F (37.1 C) (Oral)  Resp 14  Ht 5\' 4"  (1.626 m)  Wt 161 lb (73.029 kg)  BMI 27.62 kg/m2  SpO2 100%  LMP 01/22/2014 Physical Exam  Nursing note and vitals reviewed. Constitutional: She is oriented to person, place, and time. She appears well-developed and well-nourished. No distress.  Pt lying comfortably in exam bed, NAD.   HENT:  Head: Normocephalic and atraumatic.  Eyes: Conjunctivae are normal. No scleral icterus.  Neck: Normal range of motion. Neck supple.  No nuchal rigidity or meningeal signs.  Cardiovascular: Normal rate, regular rhythm and normal heart sounds.   Pulmonary/Chest: Effort normal  and breath sounds normal. No respiratory distress. She has no wheezes. She has no rales. She exhibits tenderness.  No respiratory distress, able to speak in full sentences w/o difficulty. Lungs: CTAB. Mild chest wall tenderness over right breast  Abdominal: Soft. Bowel sounds are normal. She exhibits no distension and no mass. There is no tenderness. There is no rebound and no guarding.  Soft, non-distended, non-tender.  Musculoskeletal: Normal range of motion.  Neurological: She is alert and oriented to person, place, and time.  Skin: Skin is warm and dry. She is not diaphoretic. No erythema.  Skin in tact. No ecchymosis, erythema, or warmth. No red streaking, induration, or evidence of underlying infection    ED Course  Procedures (including critical care time) Labs Review Labs Reviewed  URINALYSIS, ROUTINE W REFLEX MICROSCOPIC - Abnormal; Notable for the following:    Hgb urine dipstick TRACE (*)    All other components within normal limits  CBC WITH DIFFERENTIAL - Abnormal; Notable for the following:    Hemoglobin 10.3 (*)    HCT 30.4 (*)    MCV 72.6 (*)    MCH 24.6 (*)    RDW 17.2 (*)    Platelets 410 (*)    All other components within normal limits  BASIC METABOLIC PANEL  URINE MICROSCOPIC-ADD ON  POC URINE PREG, ED  Randolm Idol, ED    Imaging Review Dg Chest 2 View  02/22/2014   CLINICAL DATA:  Shortness of breath.  EXAM: CHEST  2 VIEW  COMPARISON:  Chest x-ray 04/12/2010.  FINDINGS: Lung volumes are normal. No consolidative airspace disease. No pleural effusions. No pneumothorax. No pulmonary nodule or mass noted. Pulmonary vasculature and the cardiomediastinal silhouette are within normal limits.  IMPRESSION: No radiographic evidence of acute cardiopulmonary disease.   Electronically Signed   By: Vinnie Langton M.D.   On: 02/22/2014 22:22     EKG Interpretation   Date/Time:  Monday Feb 22 2014 20:38:10 EDT Ventricular Rate:  70 PR Interval:  188 QRS Duration:  84 QT Interval:  369 QTC Calculation: 398 R Axis:   67 Text Interpretation:  Sinus rhythm EKG WITHIN NORMAL LIMITS Confirmed by  DOCHERTY  MD, MEGAN ([id number]) on 02/22/2014 11:20:56 PM      MDM   Final diagnoses:  Chest pain  Headache  Body aches    Pt is a 28yo female with multiple vague symptoms.  Low risk for major cardiac event based off HEART score. Pt does have mildly reproducible right sided chest pain. EKG-NSR.  Pt reported dizziness but declined IV fluids, able to keep down PO fluids. Pain improved with ibuprofen. CXR: unremarkable.  No evidence of pneumonia, pneumothorax, or masses. Discussed pt with Dr. Tawnya Crook, will discharge pt home to f/u with PCP. Return precautions provided. Pt verbalized understanding and  agreement with tx plan.     Noland Fordyce, PA-C 02/23/14 [phone]

## 2014-02-22 NOTE — ED Notes (Signed)
PA Junie Panning was told about the Troponin that is 0.0. I have tried to get it to cross over but am having some trouble with it. PA is aware of troponin level.

## 2014-02-22 NOTE — ED Notes (Signed)
Pt states that she began to have right sided chest pain last pm; pt states that the pain radiates to neck; and pt c/o feeling dizzy and nauseous.

## 2014-02-23 ENCOUNTER — Encounter: Payer: Self-pay | Admitting: Obstetrics

## 2014-02-23 LAB — I-STAT TROPONIN, ED: Troponin i, poc: 0 ng/mL (ref 0.00–0.08)

## 2014-02-24 NOTE — ED Provider Notes (Signed)
Medical screening examination/treatment/procedure(s) were performed by non-physician practitioner and as supervising physician I was immediately available for consultation/collaboration.   EKG Interpretation   Date/Time:  Monday Feb 22 2014 20:38:10 EDT Ventricular Rate:  70 PR Interval:  188 QRS Duration: 84 QT Interval:  369 QTC Calculation: 398 R Axis:   67 Text Interpretation:  Sinus rhythm EKG WITHIN NORMAL LIMITS Confirmed by  Tawnya Crook  MD, MEGAN ([id number]) on 02/22/2014 11:20:56 PM        Neta Ehlers, MD 02/24/14 2224

## 2014-04-13 ENCOUNTER — Encounter (HOSPITAL_COMMUNITY): Payer: Self-pay | Admitting: Emergency Medicine

## 2014-04-13 ENCOUNTER — Emergency Department (HOSPITAL_COMMUNITY)
Admission: EM | Admit: 2014-04-13 | Discharge: 2014-04-13 | Disposition: A | Discharge status: Home / Self Care | Payer: Medicare Other | Attending: Emergency Medicine | Admitting: Emergency Medicine

## 2014-04-13 DIAGNOSIS — Z87440 Personal history of urinary (tract) infections: Secondary | ICD-10-CM | POA: Insufficient documentation

## 2014-04-13 DIAGNOSIS — R011 Cardiac murmur, unspecified: Secondary | ICD-10-CM | POA: Insufficient documentation

## 2014-04-13 DIAGNOSIS — K029 Dental caries, unspecified: Secondary | ICD-10-CM | POA: Diagnosis not present

## 2014-04-13 DIAGNOSIS — Z8742 Personal history of other diseases of the female genital tract: Secondary | ICD-10-CM | POA: Insufficient documentation

## 2014-04-13 DIAGNOSIS — Z87891 Personal history of nicotine dependence: Secondary | ICD-10-CM | POA: Insufficient documentation

## 2014-04-13 DIAGNOSIS — M26609 Unspecified temporomandibular joint disorder, unspecified side: Secondary | ICD-10-CM | POA: Insufficient documentation

## 2014-04-13 DIAGNOSIS — Z8679 Personal history of other diseases of the circulatory system: Secondary | ICD-10-CM | POA: Diagnosis not present

## 2014-04-13 DIAGNOSIS — Z862 Personal history of diseases of the blood and blood-forming organs and certain disorders involving the immune mechanism: Secondary | ICD-10-CM | POA: Diagnosis not present

## 2014-04-13 DIAGNOSIS — M26629 Arthralgia of temporomandibular joint, unspecified side: Secondary | ICD-10-CM

## 2014-04-13 DIAGNOSIS — Z8659 Personal history of other mental and behavioral disorders: Secondary | ICD-10-CM | POA: Diagnosis not present

## 2014-04-13 DIAGNOSIS — Z8619 Personal history of other infectious and parasitic diseases: Secondary | ICD-10-CM | POA: Insufficient documentation

## 2014-04-13 DIAGNOSIS — K089 Disorder of teeth and supporting structures, unspecified: Secondary | ICD-10-CM | POA: Diagnosis present

## 2014-04-13 MED ORDER — HYDROCODONE-ACETAMINOPHEN 5-325 MG PO TABS: 1.0000 | ORAL_TABLET | Freq: Four times a day (QID) | ORAL | Status: DC | PRN

## 2014-04-13 NOTE — ED Provider Notes (Signed)
CSN: [id number]     Arrival date & time 04/13/14  1210 History  This chart was scribed for non-physician practitioner, Noland Fordyce, PA-C, working with Veryl Speak, MD by Ladene Artist, ED Scribe. This patient was seen in room TR08C/TR08C and the patient's care was started at 12:45 PM.    Chief Complaint  Patient presents with  . Dental Pain   The history is provided by the patient. No language interpreter was used.   HPI Comments: Tiffany Hall is a 28 y.o. female who presents to the Emergency Department complaining of intermittent R lower dental pain that radiates up to head. Pt states that she initially reported dental pain 1 year ago when she bit down while riding over a pot hole. Pain resolved but returned last night. Pt rates pain 10/10. She denies fever, nausea, vomiting. She has tried Advil with no relief. Pt has a dentist who she called this morning and was advised to come to ED; unknown diagnosis.    Past Medical History  Diagnosis Date  . Migraine headache   . Ovarian cyst   . Sickle cell trait   . Gallstones 2012  . Abnormal Pap smear   . Chlamydia 2008  . Trichomonas 2008  . Gonorrhea 2008  . Anemia     has required blood transfusion  . Urinary tract infection   . Heart murmur     as child  . Abnormal pap 2009    Colpo - unknown results  . Gallstones   . Depression   . H/O varicella    Past Surgical History  Procedure Laterality Date  . Colposcopy    . Dilation and curettage of uterus      required blood transfusion   Family History  Problem Relation Age of Onset  . Diabetes Mother   . Hypertension Mother   . Hepatitis Mother   . Cancer Mother 70    Breast, cervical  . Diabetes Maternal Grandmother   . Hypertension Maternal Grandmother   . Cancer Maternal Grandmother 40    Breast  . Anesthesia problems Neg Hx   . Other Neg Hx    History  Substance Use Topics  . Smoking status: Former Smoker -- 0.10 packs/day for 10 years    Types: Cigarettes     Quit date: 11/05/2010  . Smokeless tobacco: Never Used     Comment: quit with preg  . Alcohol Use: 0.0 oz/week     Comment: occ   OB History   Grav Para Term Preterm Abortions TAB SAB Ect Mult Living   5 4 4  0 1 0 1 0 0 4     Review of Systems  Constitutional: Negative for fever.  HENT: Positive for dental problem.   Gastrointestinal: Negative for nausea and vomiting.  All other systems reviewed and are negative.  Allergies  Nubain  Home Medications   Prior to Admission medications   Medication Sig Start Date End Date Taking? Authorizing Provider  Aspirin-Salicylamide-Caffeine (BC HEADACHE POWDER PO) Take 1 packet by mouth daily as needed (for pain).   Yes Historical Provider, MD  ibuprofen (ADVIL,MOTRIN) 200 MG tablet Take 800 mg by mouth daily as needed for mild pain.    Yes Historical Provider, MD  HYDROcodone-acetaminophen (NORCO/VICODIN) 5-325 MG per tablet Take 1-2 tablets by mouth every 6 (six) hours as needed for moderate pain or severe pain. 04/13/14   Noland Fordyce, PA-C   Triage Vitals: BP 144/70  Pulse 69  Temp(Src) 98.8 F (37.1  C) (Oral)  Resp 20  Ht 5\' 4"  (1.626 m)  Wt 160 lb (72.576 kg)  BMI 27.45 kg/m2  SpO2 97% Physical Exam  Nursing note and vitals reviewed. Constitutional: She is oriented to person, place, and time. She appears well-developed and well-nourished.  HENT:  Head: Normocephalic and atraumatic.  Mouth/Throat: Uvula is midline, oropharynx is clear and moist and mucous membranes are normal. No oral lesions. No trismus in the jaw. Normal dentition. Dental caries present. No dental abscesses or uvula swelling.  Mouth: multiple dental caries w/o apical abscess presenting. No cracked teeth. Tenderness along palpation of right TMJ and angle of mandible. No edema, erythema, or warmth. Intermittent crepitus of TMJ with opening and closing of mouth.   Eyes: EOM are normal.  Neck: Normal range of motion.  Cardiovascular: Normal rate.    Pulmonary/Chest: Effort normal.  Musculoskeletal: Normal range of motion.  Neurological: She is alert and oriented to person, place, and time.  Skin: Skin is warm and dry.  Psychiatric: She has a normal mood and affect. Her behavior is normal.   ED Course  Procedures (including critical care time) DIAGNOSTIC STUDIES: Oxygen Saturation is 97% on RA, normal by my interpretation.    COORDINATION OF CARE: 12:50 PM-Discussed treatment plan with pt at bedside and pt agreed to plan.   Labs Review Labs Reviewed - No data to display  Imaging Review No results found.   EKG Interpretation None      MDM   Final diagnoses:  TMJ arthralgia  Dental caries   Pt is a 28yo female c/o intermittent right sided jaw pain x1 year w/o specific dx.  Pain recurred last night.  On exam, no obvious deformity. No apical abscess. Multiple dental caries but no tenderness to specific tooth.  Tenderness along TMJ and angle of mandible.  Symptoms consistent with TMJ syndrome. Will tx symptomatically for now. Advised to f/u with dentist via dental resource guide or Dr. Benson Norway, oral surgery, for further evaluation and tx. Return precautions provided. Pt verbalized understanding and agreement with tx plan.   I personally performed the services described in this documentation, which was scribed in my presence. The recorded information has been reviewed and is accurate.    Noland Fordyce, PA-C 04/13/14 1335

## 2014-04-13 NOTE — ED Notes (Signed)
Pt presents to department for evaluation of R lower dental pain. 8/10 pain upon arrival. Respirations unlabored. NAD.

## 2014-04-13 NOTE — ED Provider Notes (Signed)
Medical screening examination/treatment/procedure(s) were performed by non-physician practitioner and as supervising physician I was immediately available for consultation/collaboration.     Veryl Speak, MD 04/13/14 [phone]

## 2014-05-06 ENCOUNTER — Emergency Department (HOSPITAL_COMMUNITY)
Admission: EM | Admit: 2014-05-06 | Discharge: 2014-05-06 | Disposition: A | Discharge status: Home / Self Care | Payer: Medicare Other | Attending: Emergency Medicine | Admitting: Emergency Medicine

## 2014-05-06 ENCOUNTER — Encounter (HOSPITAL_COMMUNITY): Payer: Self-pay | Admitting: Emergency Medicine

## 2014-05-06 DIAGNOSIS — Z8679 Personal history of other diseases of the circulatory system: Secondary | ICD-10-CM | POA: Diagnosis not present

## 2014-05-06 DIAGNOSIS — Z8659 Personal history of other mental and behavioral disorders: Secondary | ICD-10-CM | POA: Insufficient documentation

## 2014-05-06 DIAGNOSIS — R011 Cardiac murmur, unspecified: Secondary | ICD-10-CM | POA: Insufficient documentation

## 2014-05-06 DIAGNOSIS — R110 Nausea: Secondary | ICD-10-CM | POA: Insufficient documentation

## 2014-05-06 DIAGNOSIS — Z8619 Personal history of other infectious and parasitic diseases: Secondary | ICD-10-CM | POA: Diagnosis not present

## 2014-05-06 DIAGNOSIS — R079 Chest pain, unspecified: Secondary | ICD-10-CM | POA: Insufficient documentation

## 2014-05-06 DIAGNOSIS — Z862 Personal history of diseases of the blood and blood-forming organs and certain disorders involving the immune mechanism: Secondary | ICD-10-CM | POA: Diagnosis not present

## 2014-05-06 DIAGNOSIS — Z3202 Encounter for pregnancy test, result negative: Secondary | ICD-10-CM | POA: Diagnosis not present

## 2014-05-06 DIAGNOSIS — Z791 Long term (current) use of non-steroidal anti-inflammatories (NSAID): Secondary | ICD-10-CM | POA: Insufficient documentation

## 2014-05-06 DIAGNOSIS — Z87440 Personal history of urinary (tract) infections: Secondary | ICD-10-CM | POA: Diagnosis not present

## 2014-05-06 DIAGNOSIS — N644 Mastodynia: Secondary | ICD-10-CM

## 2014-05-06 DIAGNOSIS — Z8719 Personal history of other diseases of the digestive system: Secondary | ICD-10-CM | POA: Insufficient documentation

## 2014-05-06 DIAGNOSIS — Z87891 Personal history of nicotine dependence: Secondary | ICD-10-CM | POA: Insufficient documentation

## 2014-05-06 LAB — POC URINE PREG, ED: Preg Test, Ur: NEGATIVE

## 2014-05-06 MED ORDER — KETOROLAC TROMETHAMINE 30 MG/ML IJ SOLN
30.0000 mg | Freq: Once | INTRAMUSCULAR | Status: AC
  Administered 2014-05-06: 30 mg via INTRAMUSCULAR

## 2014-05-06 MED ORDER — NAPROXEN 500 MG PO TABS: 500.0000 mg | ORAL_TABLET | Freq: Two times a day (BID) | ORAL | Status: DC

## 2014-05-06 MED ORDER — KETOROLAC TROMETHAMINE 30 MG/ML IJ SOLN
30.0000 mg | Freq: Once | INTRAMUSCULAR | Status: DC
  Filled 2014-05-06: qty 1

## 2014-05-06 NOTE — Discharge Instructions (Signed)
Breast Tenderness Breast tenderness is a common problem for women of all ages. Breast tenderness may cause mild discomfort to severe pain. It has a variety of causes. Your health care provider will find out the likely cause of your breast tenderness by examining your breasts, asking you about symptoms, and ordering some tests. Breast tenderness usually does not mean you have breast cancer. HOME CARE INSTRUCTIONS  Breast tenderness often can be handled at home. You can try:  Getting fitted for a new bra that provides more support, especially during exercise.  Wearing a more supportive bra or sports bra while sleeping when your breasts are very tender.  If you have a breast injury, apply ice to the area:  Put ice in a plastic bag.  Place a towel between your skin and the bag.  Leave the ice on for 20 minutes, 2-3 times a day.  If your breasts are too full of milk as a result of breastfeeding, try:  Expressing milk either by hand or with a breast pump.  Applying a warm compress to the breasts for relief.  Taking over-the-counter pain relievers, if approved by your health care provider.  Taking other medicines that your health care provider prescribes. These may include antibiotic medicines or birth control pills. Over the long term, your breast tenderness might be eased if you:  Cut down on caffeine.  Reduce the amount of fat in your diet. Keep a log of the days and times when your breasts are most tender. This will help you and your health care provider find the cause of the tenderness and how to relieve it. Also, learn how to do breast exams at home. This will help you notice if you have an unusual growth or lump that could cause tenderness. SEEK MEDICAL CARE IF:   Any part of your breast is hard, red, and hot to the touch. This could be a sign of infection.  Fluid is coming out of your nipples (and you are not breastfeeding). Especially watch for blood or pus.  You have a fever  as well as breast tenderness.  You have a new or painful lump in your breast that remains after your menstrual period ends.  You have tried to take care of the pain at home, but it has not gone away.  Your breast pain is getting worse, or the pain is making it hard to do the things you usually do during your day. Document Released: 09/06/2008 Document Revised: 05/27/2013 Document Reviewed: 04/23/2013 [facility] Patient Information 2015 [place], [place]. This information is not intended to replace advice given to you by your health care provider. Make sure you discuss any questions you have with your health care provider.  Fibrocystic Breast Changes Fibrocystic breast changes happen when tiny sacs filled with fluid form in the breast. They are not cancer. They can feel like lumps. HOME CARE  Check your breasts after every menstrual period or the first day of every month if you do not have menstrual periods. Check for:  Soreness.  New puffiness (swelling).  A change in breast size.  A change in a lump that was already there.  Only take medicine as told by your doctor.  Wear a support or sports bra that fits well.  Avoid caffeine in pop, chocolate, coffee, and tea. GET HELP IF:   You have fluid coming from your nipples, especially if it is bloody.  You have new lumps or bumps in your breast.  Your breast becomes puffy, red,  and painful.  You have changes in how your breast looks.  Your nipples look flat or sunk in. Document Released: 09/06/2008 Document Revised: 09/29/2013 Document Reviewed: 03/15/2013 [facility] Patient Information 2015 [place], [place]. This information is not intended to replace advice given to you by your health care provider. Make sure you discuss any questions you have with your health care provider.

## 2014-05-06 NOTE — ED Provider Notes (Signed)
CSN: [id number]     Arrival date & time 05/06/14  1303 History  This chart was scribed for Starlyn Skeans PA-C working with Malvin Johns, MD by Stacy Gardner, ED scribe. This patient was seen in room WTR6/WTR6 and the patient's care was started at 2:42 PM.   First MD Initiated Contact with Patient 05/06/14 1319     Chief Complaint  Patient presents with  . Breast Pain    pain in r/breat, next to nipple x 24 hours     (Consider location/radiation/quality/duration/timing/severity/associated sxs/prior Treatment) The history is provided by the patient and medical records. No language interpreter was used.   HPI Comments: Tiffany Hall is a 28 y.o. female who presents to the Emergency Department complaining of sharp, constant, right breast pain near her areola for the past day.The pain is worse with wearing a bra, pressure, and touch.  Pt describes the pain as a 7/10 in severity. She has tried Vicodin but that did not help. She has associated nausea. Pt had a similar episode when she was pregnancy. Pt's last mensae was 04/17/14 and she denies pregnancy. Nothing seems to help. Pt denies breastfeeding any of her children. Denies seeing a OB/GYN since her last pregnancy. Denies nipple discharge and rash. Denies fever, vomiting, and chills. Denies having any hard nodules to her breast. Denies hx of dense breast tissue.  Pt's mother had breat cancer at the age of 22 and her maternal grandmother had breast cancer.  Past Medical History  Diagnosis Date  . Migraine headache   . Ovarian cyst   . Sickle cell trait   . Gallstones 2012  . Abnormal Pap smear   . Chlamydia 2008  . Trichomonas 2008  . Gonorrhea 2008  . Anemia     has required blood transfusion  . Urinary tract infection   . Heart murmur     as child  . Abnormal pap 2009    Colpo - unknown results  . Gallstones   . Depression   . H/O varicella   . Dental caries    Past Surgical History  Procedure Laterality Date  .  Colposcopy    . Dilation and curettage of uterus      required blood transfusion   Family History  Problem Relation Age of Onset  . Diabetes Mother   . Hypertension Mother   . Hepatitis Mother   . Cancer Mother 25    Breast, cervical  . Diabetes Maternal Grandmother   . Hypertension Maternal Grandmother   . Cancer Maternal Grandmother 40    Breast  . Anesthesia problems Neg Hx   . Other Neg Hx    History  Substance Use Topics  . Smoking status: Former Smoker -- 0.10 packs/day for 10 years    Types: Cigarettes    Quit date: 11/05/2010  . Smokeless tobacco: Never Used     Comment: quit with preg  . Alcohol Use: 0.0 oz/week     Comment: occ   OB History   Grav Para Term Preterm Abortions TAB SAB Ect Mult Living   5 4 4  0 1 0 1 0 0 4     Review of Systems  Constitutional: Negative for chills and fatigue.  Gastrointestinal: Positive for nausea. Negative for vomiting.  Musculoskeletal:       Breast pain  Skin: Negative for rash.  All other systems reviewed and are negative.     Allergies  Nubain  Home Medications   Prior to Admission medications  Medication Sig Start Date End Date Taking? Authorizing Provider  HYDROcodone-acetaminophen (NORCO/VICODIN) 5-325 MG per tablet Take 1-2 tablets by mouth every 6 (six) hours as needed for moderate pain or severe pain. 04/13/14  Yes Noland Fordyce, PA-C  ibuprofen (ADVIL,MOTRIN) 200 MG tablet Take 800 mg by mouth daily as needed for mild pain.    Yes Historical Provider, MD  naproxen (NAPROSYN) 500 MG tablet Take 1 tablet (500 mg total) by mouth 2 (two) times daily with a meal. 05/06/14   Aarionna Germer A Forcucci, PA-C   BP 115/65  Temp(Src) 99.1 F (37.3 C) (Oral)  Resp 18  Wt 153 lb (69.4 kg)  SpO2 100%  LMP 04/17/2014  Breastfeeding? No Physical Exam  Nursing note and vitals reviewed. Constitutional: She is oriented to person, place, and time. She appears well-developed and well-nourished.  HENT:  Head: Normocephalic and  atraumatic.  Mouth/Throat: Oropharynx is clear and moist. No oropharyngeal exudate.  Eyes: Conjunctivae are normal. Pupils are equal, round, and reactive to light. No scleral icterus.  Neck: Normal range of motion. Neck supple. No JVD present. No thyromegaly present.  Cardiovascular: Normal rate, regular rhythm, normal heart sounds and intact distal pulses.  Exam reveals no gallop and no friction rub.   No murmur heard. Pulmonary/Chest: Effort normal and breath sounds normal. No respiratory distress. She has no wheezes. She has no rales. She exhibits tenderness. Right breast exhibits tenderness. Right breast exhibits no inverted nipple, no mass, no nipple discharge and no skin change. Left breast exhibits tenderness. Left breast exhibits no inverted nipple, no mass, no nipple discharge and no skin change. Breasts are symmetrical.  Musculoskeletal: Normal range of motion.  Breast exam: chaperone is present.  Lymphadenopathy:    She has no cervical adenopathy.  Neurological: She is alert and oriented to person, place, and time.  Skin: Skin is warm and dry. No rash noted. She is not diaphoretic. No erythema.  Psychiatric: She has a normal mood and affect. Her behavior is normal. Judgment and thought content normal.    ED Course  Procedures (including critical care time) DIAGNOSTIC STUDIES: Oxygen Saturation is 100% on room air, normal by my interpretation.    COORDINATION OF CARE:  2:50 PM Discussed course of care with pt which includes toradol. She should follow up with an OB/Gyn. Pt understands and agrees.   Labs Review Labs Reviewed  POC URINE PREG, ED    Imaging Review No results found.   EKG Interpretation None      MDM   Final diagnoses:  Mastalgia in female   Patient presents to the ED with bilateral breast pain x 2 days.  Patient has a significant family history of breast cancer.  Physical exam shows no erythema, rash, or concern for abscess.  There are no palpable  masses at this time.  Patient likely has fibrocystic breasts.  Will treat her here with an IM injection of toradol and was given a prescription for naproxen BID.  Patient was told to follow-up with an OBGyn in [place] given family history.  Patient was told to return for signs and symptoms of mastitis, breast abscess.  She states understanding and agreement at this time.  Urine pregnancy test here in the ED is negative.  I personally performed the services described in this documentation, which was scribed in my presence. The recorded information has been reviewed and is accurate.     Cherylann Parr, PA-C 05/06/14 1502

## 2014-05-06 NOTE — ED Notes (Signed)
Pt reports pain in r/breast x 24 hours. Denies swelling or discharge. Has never breastfed

## 2014-05-06 NOTE — ED Provider Notes (Signed)
Medical screening examination/treatment/procedure(s) were performed by non-physician practitioner and as supervising physician I was immediately available for consultation/collaboration.   EKG Interpretation None        Malvin Johns, MD 05/06/14 1510

## 2014-05-12 ENCOUNTER — Inpatient Hospital Stay (HOSPITAL_COMMUNITY)
Admission: AD | Admit: 2014-05-12 | Discharge: 2014-05-12 | Disposition: A | Discharge status: Home / Self Care | Payer: Medicare Other | Source: Ambulatory Visit | Attending: Obstetrics and Gynecology | Admitting: Obstetrics and Gynecology

## 2014-05-12 ENCOUNTER — Encounter (HOSPITAL_COMMUNITY): Payer: Self-pay | Admitting: *Deleted

## 2014-05-12 DIAGNOSIS — O210 Mild hyperemesis gravidarum: Secondary | ICD-10-CM | POA: Diagnosis present

## 2014-05-12 DIAGNOSIS — O219 Vomiting of pregnancy, unspecified: Secondary | ICD-10-CM

## 2014-05-12 DIAGNOSIS — Z87891 Personal history of nicotine dependence: Secondary | ICD-10-CM | POA: Diagnosis not present

## 2014-05-12 DIAGNOSIS — R011 Cardiac murmur, unspecified: Secondary | ICD-10-CM | POA: Insufficient documentation

## 2014-05-12 LAB — URINALYSIS, ROUTINE W REFLEX MICROSCOPIC
Bilirubin Urine: NEGATIVE
Glucose, UA: NEGATIVE mg/dL
Ketones, ur: NEGATIVE mg/dL
LEUKOCYTES UA: NEGATIVE
Nitrite: NEGATIVE
PROTEIN: NEGATIVE mg/dL
Specific Gravity, Urine: 1.01 (ref 1.005–1.030)
Urobilinogen, UA: 0.2 mg/dL (ref 0.0–1.0)
pH: 6 (ref 5.0–8.0)

## 2014-05-12 LAB — POCT PREGNANCY, URINE: PREG TEST UR: POSITIVE — AB

## 2014-05-12 LAB — URINE MICROSCOPIC-ADD ON

## 2014-05-12 MED ORDER — PROMETHAZINE HCL 25 MG PO TABS: 25.0000 mg | ORAL_TABLET | Freq: Once | ORAL | Status: DC

## 2014-05-12 NOTE — MAU Note (Signed)
Pt reports she hs had n/v x 1 week. Took HPT and first one was positive the next one was negative.

## 2014-05-12 NOTE — Discharge Instructions (Signed)

## 2014-05-12 NOTE — MAU Note (Signed)
Pt states she had been having trouble keeping stuff down.Pt states she was vomiting yesterday but "today it is more nausea"

## 2014-05-12 NOTE — MAU Provider Note (Signed)
History     CSN: [id number]  Arrival date and time: 05/12/14 1233   First Provider Initiated Contact with Patient 05/12/14 1517      Chief Complaint  Patient presents with  . Emesis   HPI Comments: Tiffany Hall 28 y.o. [id number] at 3 weeks and 4 days presents to MAU with nausea and vomiting. She is vomiting about 3 x per day. She has a [facility]B [facility] on [facility]/01/14. Her LMP was 04/17/14. She denies any pain or bleeding.   Emesis       Past Medical History  Diagnosis Date  . Migraine headache   . Ovarian cyst   . Sickle cell trait   . Gallstones 2012  . Abnormal Pap smear   . Chlamydia 2008  . Trichomonas 2008  . Gonorrhea 2008  . Anemia     has required blood transfusion  . Urinary tract infection   . Heart murmur     as child  . Abnormal pap 2009    Colpo - unknown results  . Gallstones   . Depression   . H/O varicella   . Dental caries     Past Surgical History  Procedure Laterality Date  . Colposcopy    . Dilation and curettage of uterus      required blood transfusion    Family History  Problem Relation Age of Onset  . Diabetes Mother   . Hypertension Mother   . Hepatitis Mother   . Cancer Mother 67    Breast, cervical  . Diabetes Maternal Grandmother   . Hypertension Maternal Grandmother   . Cancer Maternal Grandmother 40    Breast  . Anesthesia problems Neg Hx   . Other Neg Hx     History  Substance Use Topics  . Smoking status: Former Smoker -- 0.10 packs/day for 10 years    Types: Cigarettes    Quit date: 11/05/2010  . Smokeless tobacco: Never Used     Comment: quit with preg  . Alcohol Use: 0.0 oz/week     Comment: occ    Allergies:  Allergies  Allergen Reactions  . Nubain [Nalbuphine Hcl] Swelling and Palpitations    Prescriptions prior to admission  Medication Sig Dispense Refill  . ibuprofen (ADVIL,MOTRIN) 200 MG tablet Take 400 mg by mouth 4 (four) times daily as needed for headache or mild pain.          Review of Systems  Constitutional: Negative.   HENT: Negative.   Eyes: Negative.   Respiratory: Negative.   Cardiovascular: Negative.   Gastrointestinal: Positive for nausea and vomiting.  Genitourinary: Negative.   Musculoskeletal: Negative.   Skin: Negative.   Neurological: Negative.   Psychiatric/Behavioral: Negative.    Physical Exam   Blood pressure 124/68, pulse 89, temperature 99.2 F (37.3 C), temperature source Oral, resp. rate 18, height 5\' 4"  (1.626 m), weight 74.39 kg (164 lb), last menstrual period 04/17/2014, not currently breastfeeding.  Physical Exam  Constitutional: She is oriented to person, place, and time. She appears well-developed and well-nourished. No distress.  HENT:  Head: Normocephalic and atraumatic.  Eyes: Pupils are equal, round, and reactive to light.  Cardiovascular: Normal rate, regular rhythm and normal heart sounds.   Respiratory: Effort normal and breath sounds normal.  GI: Soft. Bowel sounds are normal. She exhibits no distension. There is no tenderness. There is no rebound.  Genitourinary:  Not examined  Musculoskeletal: Normal range of motion.  Neurological: She is alert and  oriented to person, place, and time.  Skin: Skin is warm and dry.  Psychiatric: She has a normal mood and affect. Her behavior is normal. Thought content normal.   Results for orders placed during the hospital encounter of 05/12/14 (from the past 24 hour(s))  URINALYSIS, ROUTINE W REFLEX MICROSCOPIC     Status: Abnormal   Collection Time    05/12/14 12:45 PM      Result Value Ref Range   Color, Urine YELLOW  YELLOW   APPearance CLEAR  CLEAR   Specific Gravity, Urine 1.010  1.005 - 1.030   pH 6.0  5.0 - 8.0   Glucose, UA NEGATIVE  NEGATIVE mg/dL   Hgb urine dipstick SMALL (*) NEGATIVE   Bilirubin Urine NEGATIVE  NEGATIVE   Ketones, ur NEGATIVE  NEGATIVE mg/dL   Protein, ur NEGATIVE  NEGATIVE mg/dL   Urobilinogen, UA 0.2  0.0 - 1.0 mg/dL   Nitrite NEGATIVE   NEGATIVE   Leukocytes, UA NEGATIVE  NEGATIVE  URINE MICROSCOPIC-ADD ON     Status: Abnormal   Collection Time    05/12/14 12:45 PM      Result Value Ref Range   Squamous Epithelial / LPF FEW (*) RARE   RBC / HPF 0-2  <3 RBC/hpf   Bacteria, UA FEW (*) RARE   Urine-Other MUCOUS PRESENT    POCT PREGNANCY, URINE     Status: Abnormal   Collection Time    05/12/14  1:32 PM      Result Value Ref Range   Preg Test, Ur POSITIVE (*) NEGATIVE    MAU Course  Procedures  MDM Phenergan 25 mg now  Assessment and Plan   A: nausea and Vomiting in early pregnancy  P: Phenergan 25 mg po q6 hours prn Small frequent meals Avoid fast/ greasy foods Keep NOB visit on 06/01/14 with [facility]  [name] 05/12/2014, 3:25 PM

## 2014-05-12 NOTE — MAU Provider Note (Signed)
Attestation of Attending Supervision of Advanced Practitioner (CNM/NP): Evaluation and management procedures were performed by the Advanced Practitioner under my supervision and collaboration.  I have reviewed the Advanced Practitioner's note and chart, and I agree with the management and plan.  HARRAWAY-SMITH, Lacora Folmer 3:40 PM

## 2014-05-13 ENCOUNTER — Emergency Department (HOSPITAL_COMMUNITY): Payer: Medicare Other

## 2014-05-13 ENCOUNTER — Encounter (HOSPITAL_COMMUNITY): Payer: Self-pay | Admitting: Emergency Medicine

## 2014-05-13 ENCOUNTER — Emergency Department (HOSPITAL_COMMUNITY)
Admission: EM | Admit: 2014-05-13 | Discharge: 2014-05-13 | Disposition: A | Discharge status: Home / Self Care | Payer: Medicare Other | Attending: Emergency Medicine | Admitting: Emergency Medicine

## 2014-05-13 DIAGNOSIS — D649 Anemia, unspecified: Secondary | ICD-10-CM | POA: Insufficient documentation

## 2014-05-13 DIAGNOSIS — Z8719 Personal history of other diseases of the digestive system: Secondary | ICD-10-CM | POA: Diagnosis not present

## 2014-05-13 DIAGNOSIS — R011 Cardiac murmur, unspecified: Secondary | ICD-10-CM | POA: Diagnosis not present

## 2014-05-13 DIAGNOSIS — Z87891 Personal history of nicotine dependence: Secondary | ICD-10-CM | POA: Diagnosis not present

## 2014-05-13 DIAGNOSIS — Z79899 Other long term (current) drug therapy: Secondary | ICD-10-CM | POA: Insufficient documentation

## 2014-05-13 DIAGNOSIS — Z8619 Personal history of other infectious and parasitic diseases: Secondary | ICD-10-CM | POA: Insufficient documentation

## 2014-05-13 DIAGNOSIS — O9989 Other specified diseases and conditions complicating pregnancy, childbirth and the puerperium: Secondary | ICD-10-CM | POA: Insufficient documentation

## 2014-05-13 DIAGNOSIS — Z87440 Personal history of urinary (tract) infections: Secondary | ICD-10-CM | POA: Insufficient documentation

## 2014-05-13 DIAGNOSIS — O99019 Anemia complicating pregnancy, unspecified trimester: Secondary | ICD-10-CM | POA: Diagnosis not present

## 2014-05-13 DIAGNOSIS — Z8679 Personal history of other diseases of the circulatory system: Secondary | ICD-10-CM | POA: Insufficient documentation

## 2014-05-13 DIAGNOSIS — Z8659 Personal history of other mental and behavioral disorders: Secondary | ICD-10-CM | POA: Insufficient documentation

## 2014-05-13 DIAGNOSIS — R109 Unspecified abdominal pain: Secondary | ICD-10-CM | POA: Insufficient documentation

## 2014-05-13 DIAGNOSIS — O26899 Other specified pregnancy related conditions, unspecified trimester: Secondary | ICD-10-CM

## 2014-05-13 DIAGNOSIS — Z3490 Encounter for supervision of normal pregnancy, unspecified, unspecified trimester: Secondary | ICD-10-CM

## 2014-05-13 LAB — URINE MICROSCOPIC-ADD ON

## 2014-05-13 LAB — WET PREP, GENITAL
Clue Cells Wet Prep HPF POC: NONE SEEN
Trich, Wet Prep: NONE SEEN
YEAST WET PREP: NONE SEEN

## 2014-05-13 LAB — URINALYSIS, ROUTINE W REFLEX MICROSCOPIC
Bilirubin Urine: NEGATIVE
GLUCOSE, UA: NEGATIVE mg/dL
Ketones, ur: NEGATIVE mg/dL
LEUKOCYTES UA: NEGATIVE
Nitrite: NEGATIVE
PROTEIN: NEGATIVE mg/dL
Specific Gravity, Urine: 1.028 (ref 1.005–1.030)
Urobilinogen, UA: 2 mg/dL — ABNORMAL HIGH (ref 0.0–1.0)
pH: 6.5 (ref 5.0–8.0)

## 2014-05-13 LAB — HCG, QUANTITATIVE, PREGNANCY: hCG, Beta Chain, Quant, S: 219 m[IU]/mL — ABNORMAL HIGH (ref <5)

## 2014-05-13 LAB — PREGNANCY, URINE: Preg Test, Ur: POSITIVE — AB

## 2014-05-13 MED ORDER — ACETAMINOPHEN 500 MG PO TABS: 500.0000 mg | ORAL_TABLET | Freq: Four times a day (QID) | ORAL | Status: DC | PRN

## 2014-05-13 NOTE — Discharge Instructions (Signed)
You were evaluated for abdominal cramping during pregancy.  It appears that you're still very early in her pregnancy given recent positive pregnancy test and low blood pregnancy test.  You need to followup closely in 2 days for repeat beta hCG. If you continue to have pain, the pain changes, worsens, you have dizziness or any new or worsening symptoms she needs to be reevaluated. Your pain could be related to her early miscarriage or pregnancy that is not in the uterus. However because her blood pregnancy test also low and you only recently has had a positive pregnancy test, ultrasound but will not likely be helpful at this point.  Abdominal Pain During Pregnancy Abdominal pain is common in pregnancy. Most of the time, it does not cause harm. There are many causes of abdominal pain. Some causes are more serious than others. Some of the causes of abdominal pain in pregnancy are easily diagnosed. Occasionally, the diagnosis takes time to understand. Other times, the cause is not determined. Abdominal pain can be a sign that something is very wrong with the pregnancy, or the pain may have nothing to do with the pregnancy at all. For this reason, always tell your health care provider if you have any abdominal discomfort. HOME CARE INSTRUCTIONS  Monitor your abdominal pain for any changes. The following actions may help to alleviate any discomfort you are experiencing:  Do not have sexual intercourse or put anything in your vagina until your symptoms go away completely.  Get plenty of rest until your pain improves.  Drink clear fluids if you feel nauseous. Avoid solid food as long as you are uncomfortable or nauseous.  Only take over-the-counter or prescription medicine as directed by your health care provider.  Keep all follow-up appointments with your health care provider. SEEK IMMEDIATE MEDICAL CARE IF:  You are bleeding, leaking fluid, or passing tissue from the vagina.  You have increasing pain  or cramping.  You have persistent vomiting.  You have painful or bloody urination.  You have a fever.  You notice a decrease in your baby's movements.  You have extreme weakness or feel faint.  You have shortness of breath, with or without abdominal pain.  You develop a severe headache with abdominal pain.  You have abnormal vaginal discharge with abdominal pain.  You have persistent diarrhea.  You have abdominal pain that continues even after rest, or gets worse. MAKE SURE YOU:   Understand these instructions.  Will watch your condition.  Will get help right away if you are not doing well or get worse. Document Released: 09/24/2005 Document Revised: 07/15/2013 Document Reviewed: 04/23/2013 [facility] Patient Information 2015 [place], [place]. This information is not intended to replace advice given to you by your health care provider. Make sure you discuss any questions you have with your health care provider.

## 2014-05-13 NOTE — ED Provider Notes (Signed)
CSN: [id number]     Arrival date & time 05/13/14  1954 History   First MD Initiated Contact with Patient 05/13/14 2111     Chief Complaint  Patient presents with  . Abdominal Cramping     (Consider location/radiation/quality/duration/timing/severity/associated sxs/prior Treatment) HPI  This is a 28 year old G6 P4 female currently 3 weeks and 5 days pregnant based on LMP of July 11 who presents with abdominal cramping. Patient states that she had her first positive pregnancy test on August 2. [facility]. She reports lower crampy abdominal pain that is nonradiating. It is not localized. It is 7/10. She's not taking anything. Nothing makes it better or worse. She denies any vaginal bleeding or discharge. She has had uncomplicated prior pregnancies.    Past Medical History  Diagnosis Date  . Migraine headache   . Ovarian cyst   . Sickle cell trait   . Gallstones 2012  . Abnormal Pap smear   . Chlamydia 2008  . Trichomonas 2008  . Gonorrhea 2008  . Anemia     has required blood transfusion  . Urinary tract infection   . Heart murmur     as child  . Abnormal pap 2009    Colpo - unknown results  . Gallstones   . Depression   . H/O varicella   . Dental caries    Past Surgical History  Procedure Laterality Date  . Colposcopy    . Dilation and curettage of uterus      required blood transfusion   Family History  Problem Relation Age of Onset  . Diabetes Mother   . Hypertension Mother   . Hepatitis Mother   . Cancer Mother 54    Breast, cervical  . Diabetes Maternal Grandmother   . Hypertension Maternal Grandmother   . Cancer Maternal Grandmother 40    Breast  . Anesthesia problems Neg Hx   . Other Neg Hx    History  Substance Use Topics  . Smoking status: Former Smoker -- 0.10 packs/day for 10 years    Types: Cigarettes    Quit date: 11/05/2010  . Smokeless tobacco: Never Used     Comment: quit  with preg  . Alcohol Use: 0.0 oz/week     Comment: occ   OB History   Grav Para Term Preterm Abortions TAB SAB Ect Mult Living   7 4 4  0 1 0 1 0 0 4     Review of Systems  Constitutional: Negative for fever.  Respiratory: Negative for chest tightness and shortness of breath.   Cardiovascular: Negative for chest pain.  Gastrointestinal: Positive for abdominal pain. Negative for nausea and vomiting.  Genitourinary: Negative for dysuria, vaginal bleeding and vaginal discharge.  All other systems reviewed and are negative.     Allergies  Nubain  Home Medications   Prior to Admission medications   Medication Sig Start Date End Date Taking? Authorizing Provider  Prenatal Vit-Fe Fumarate-FA (PRENATAL MULTIVITAMIN) TABS tablet Take 1 tablet by mouth daily at 12 noon.   Yes Historical Provider, MD  promethazine (PHENERGAN) 25 MG tablet Take 1 tablet (25 mg total) by mouth once. 05/12/14  Yes Olegario Messier, NP  acetaminophen (TYLENOL) 500 MG tablet Take 1 tablet (500 mg total) by mouth every 6 (six) hours as needed for moderate pain. 05/13/14   Merryl Hacker, MD   BP 121/64  Pulse 68  Temp(Src) 99.1 F (37.3 C) (Oral)  Resp  18  SpO2 100%  LMP 04/17/2014 Physical Exam  Nursing note and vitals reviewed. Constitutional: She is oriented to person, place, and time. She appears well-developed and well-nourished. No distress.  HENT:  Head: Normocephalic and atraumatic.  Cardiovascular: Normal rate, regular rhythm and normal heart sounds.   No murmur heard. Pulmonary/Chest: Effort normal and breath sounds normal. No respiratory distress. She has no wheezes.  Abdominal: Soft. Bowel sounds are normal. There is no tenderness. There is no rebound and no guarding.  Genitourinary:  Normal external vaginal exam, no adnexal tenderness noted, no cervical motion tenderness, scant vaginal discharge  Neurological: She is alert and oriented to person, place, and time.  Skin: Skin is warm and  dry.  Psychiatric: She has a normal mood and affect.    ED Course  Procedures (including critical care time) Labs Review Labs Reviewed  WET PREP, GENITAL - Abnormal; Notable for the following:    WBC, Wet Prep HPF POC FEW (*)    All other components within normal limits  HCG, QUANTITATIVE, PREGNANCY - Abnormal; Notable for the following:    hCG, Beta Chain, Quant, S 219 (*)    All other components within normal limits  PREGNANCY, URINE - Abnormal; Notable for the following:    Preg Test, Ur POSITIVE (*)    All other components within normal limits  URINALYSIS, ROUTINE W REFLEX MICROSCOPIC - Abnormal; Notable for the following:    Hgb urine dipstick TRACE (*)    Urobilinogen, UA 2.0 (*)    All other components within normal limits  GC/CHLAMYDIA PROBE AMP  URINE MICROSCOPIC-ADD ON  RPR  HIV ANTIBODY (ROUTINE TESTING)    Imaging Review No results found.   EKG Interpretation None      MDM   Final diagnoses:  Early stage of pregnancy  Abdominal pain during pregnancy     patient presents with abdominal cramping. She is in early pregnancy. Vital signs are reassuring. No evidence of peritonitis on exam.  Considerations include ectopic, early miscarriage.  Pelvic exam is reassuring. Beta hCG is 219. Discriminatory zone for transvaginal ultrasound is 1500. Given that the patient had a negative pregnancy test on July 30 and her first positive pregnancy test on August 2, a bhcg seen would likely be appropriate for early pregnancy.  It is difficult to know whether the patient is turning upwards or downwards; however, do not feel like ultrasound would be helpful at this stage. Patient has no signs of peritonitis. Discuss with patient diagnostic considerations. She's to followup in 2 days for repeat beta hCG and possible ultrasound.  Patient stated understanding. She was given strict return precautions for worsening pain, vaginal bleeding, or any new or worsening symptoms.  After  history, exam, and medical workup I feel the patient has been appropriately medically screened and is safe for discharge home. Pertinent diagnoses were discussed with the patient. Patient was given return precautions.     Merryl Hacker, MD 05/13/14 2258

## 2014-05-13 NOTE — ED Notes (Signed)
Pt states that she found out yesterday that she was pregnant, about 3-4 weeks, today she started cramping and called her OBGYN, they told her if it continues to come to the ED, pt states that she's not bleeding but having some whitish discharge.

## 2014-05-14 LAB — GC/CHLAMYDIA PROBE AMP
CT PROBE, AMP APTIMA: NEGATIVE
GC Probe RNA: NEGATIVE

## 2014-05-14 LAB — HIV ANTIBODY (ROUTINE TESTING W REFLEX): HIV: NONREACTIVE

## 2014-05-14 LAB — RPR

## 2014-05-15 ENCOUNTER — Inpatient Hospital Stay (HOSPITAL_COMMUNITY)
Admission: AD | Admit: 2014-05-15 | Discharge: 2014-05-15 | Disposition: A | Discharge status: Home / Self Care | Payer: Medicare Other | Source: Ambulatory Visit | Attending: Family Medicine | Admitting: Family Medicine

## 2014-05-15 DIAGNOSIS — O99891 Other specified diseases and conditions complicating pregnancy: Secondary | ICD-10-CM | POA: Diagnosis present

## 2014-05-15 DIAGNOSIS — Z87891 Personal history of nicotine dependence: Secondary | ICD-10-CM | POA: Diagnosis not present

## 2014-05-15 DIAGNOSIS — O9989 Other specified diseases and conditions complicating pregnancy, childbirth and the puerperium: Principal | ICD-10-CM

## 2014-05-15 DIAGNOSIS — Z3490 Encounter for supervision of normal pregnancy, unspecified, unspecified trimester: Secondary | ICD-10-CM

## 2014-05-15 LAB — HCG, QUANTITATIVE, PREGNANCY: hCG, Beta Chain, Quant, S: 441 m[IU]/mL — ABNORMAL HIGH (ref <5)

## 2014-05-15 NOTE — MAU Provider Note (Signed)
Attestation of Attending Supervision of Advanced Practitioner (PA/CNM/NP): Evaluation and management procedures were performed by the Advanced Practitioner under my supervision and collaboration.  I have reviewed the Advanced Practitioner's note and chart, and I agree with the management and plan.  Donnamae Jude, MD [facility] Attending 05/15/2014 12:11 PM

## 2014-05-15 NOTE — MAU Provider Note (Signed)
History     CSN: [id number]  Arrival date and time: 05/15/14 3810  Seen by provider at [phone].   No chief complaint on file.  HPI Tiffany Hall 27 y.o. LMP 04-17-14.  Was seen in ER for abdominal pain and was told to come here for followup of quant and possible ultrasound.  Has an appointment at [facility] on Aug. 25 to begin prenatal care.  Denies having any pain or bleeding today.  No problems voiced.   OB History   Grav Para Term Preterm Abortions TAB SAB Ect Mult Living   7 4 4  0 1 0 1 0 0 4      Past Medical History  Diagnosis Date  . Migraine headache   . Ovarian cyst   . Sickle cell trait   . Gallstones 2012  . Abnormal Pap smear   . Chlamydia 2008  . Trichomonas 2008  . Gonorrhea 2008  . Anemia     has required blood transfusion  . Urinary tract infection   . Heart murmur     as child  . Abnormal pap 2009    Colpo - unknown results  . Gallstones   . Depression   . H/O varicella   . Dental caries     Past Surgical History  Procedure Laterality Date  . Colposcopy    . Dilation and curettage of uterus      required blood transfusion    Family History  Problem Relation Age of Onset  . Diabetes Mother   . Hypertension Mother   . Hepatitis Mother   . Cancer Mother 59    Breast, cervical  . Diabetes Maternal Grandmother   . Hypertension Maternal Grandmother   . Cancer Maternal Grandmother 40    Breast  . Anesthesia problems Neg Hx   . Other Neg Hx     History  Substance Use Topics  . Smoking status: Former Smoker -- 0.10 packs/day for 10 years    Types: Cigarettes    Quit date: 11/05/2010  . Smokeless tobacco: Never Used     Comment: quit with preg  . Alcohol Use: 0.0 oz/week     Comment: occ    Allergies:  Allergies  Allergen Reactions  . Nubain [Nalbuphine Hcl] Swelling and Palpitations    Prescriptions prior to admission  Medication Sig Dispense Refill  . acetaminophen (TYLENOL) 500 MG tablet Take 1 tablet (500 mg  total) by mouth every 6 (six) hours as needed for moderate pain.  30 tablet  0  . Prenatal Vit-Fe Fumarate-FA (PRENATAL MULTIVITAMIN) TABS tablet Take 1 tablet by mouth daily at 12 noon.      . promethazine (PHENERGAN) 25 MG tablet Take 1 tablet (25 mg total) by mouth once.  30 tablet  0    Review of Systems  Gastrointestinal: Negative for abdominal pain.  Genitourinary:       No vaginal discharge. No vaginal bleeding. No dysuria.   Physical Exam   Last menstrual period 04/17/2014, not currently breastfeeding.  Physical Exam  Nursing note and vitals reviewed. Constitutional: She is oriented to person, place, and time. She appears well-developed and well-nourished. No distress.  HENT:  Head: Normocephalic.  Eyes: EOM are normal.  Neck: Neck supple.  Musculoskeletal: Normal range of motion.  Neurological: She is alert and oriented to person, place, and time.  Skin: Skin is warm and dry.  Psychiatric: She has a normal mood and affect.    MAU Course  Procedures  Results for DEEM, MARMOL (MRN [id number]) as of 05/15/2014 10:07  Ref. Range 05/12/2014 13:32 05/13/2014 21:12 05/13/2014 21:29 05/13/2014 22:08 05/15/2014 09:20  hCG, Beta Chain, Quant, S Latest Range: <5 mIU/mL  219 (H)   441 (H)   MDM No ultrasound indicated today.  Reviewed labs with client.  Discussed reason not to do ultrasound and client is in agreement.  Client pleased with lab results and verbalizes she will keep her appointment for prenatal care as scheduled.  Assessment and Plan  Appropriately rising quants  Plan  No smoking, no drugs, no alcohol.  Take a prenatal vitamin one by mouth every day.  Eat small frequent snacks to avoid nausea.  Begin prenatal care as soon as possible. - Reviewed with client.   BURLESON,TERRI 05/15/2014, 9:56 AM

## 2014-05-26 ENCOUNTER — Inpatient Hospital Stay (HOSPITAL_COMMUNITY)
Admission: AD | Admit: 2014-05-26 | Discharge: 2014-05-26 | Disposition: A | Discharge status: Home / Self Care | Payer: Medicare Other | Source: Ambulatory Visit | Attending: Obstetrics and Gynecology | Admitting: Obstetrics and Gynecology

## 2014-05-26 ENCOUNTER — Encounter (HOSPITAL_COMMUNITY): Payer: Self-pay | Admitting: *Deleted

## 2014-05-26 DIAGNOSIS — O219 Vomiting of pregnancy, unspecified: Secondary | ICD-10-CM

## 2014-05-26 DIAGNOSIS — O210 Mild hyperemesis gravidarum: Secondary | ICD-10-CM | POA: Insufficient documentation

## 2014-05-26 LAB — URINE MICROSCOPIC-ADD ON

## 2014-05-26 LAB — URINALYSIS, ROUTINE W REFLEX MICROSCOPIC
BILIRUBIN URINE: NEGATIVE
GLUCOSE, UA: NEGATIVE mg/dL
Ketones, ur: NEGATIVE mg/dL
Leukocytes, UA: NEGATIVE
Nitrite: NEGATIVE
PH: 6 (ref 5.0–8.0)
Protein, ur: NEGATIVE mg/dL
Urobilinogen, UA: 0.2 mg/dL (ref 0.0–1.0)

## 2014-05-26 NOTE — MAU Provider Note (Signed)
History     CSN: [id number]  Arrival date and time: 05/26/14 0865   First Provider Initiated Contact with Patient 05/26/14 2117      Chief Complaint  Patient presents with  . Morning Sickness  . Dizziness   HPI [name] is a 28 y.o. [id number] at [redacted]w[redacted]d who presents to MAU today with complaint of nausea and occasional vomiting. The patient states that she is taking Phenergan 1/2 tab PRN for nausea and still having frequent nausea. She states that she only has vomiting ~ 1x/day. She denies abdominal pain, vaginal bleeding today. She does state occasional dizziness, especially with change or position and occasional headache, although none now.   OB History   Grav Para Term Preterm Abortions TAB SAB Ect Mult Living   7 4 4  0 1 0 1 0 0 4      Past Medical History  Diagnosis Date  . Migraine headache   . Ovarian cyst   . Sickle cell trait   . Gallstones 2012  . Abnormal Pap smear   . Chlamydia 2008  . Trichomonas 2008  . Gonorrhea 2008  . Anemia     has required blood transfusion  . Urinary tract infection   . Heart murmur     as child  . Abnormal pap 2009    Colpo - unknown results  . Gallstones   . Depression   . H/O varicella   . Dental caries     Past Surgical History  Procedure Laterality Date  . Colposcopy    . Dilation and curettage of uterus      required blood transfusion    Family History  Problem Relation Age of Onset  . Diabetes Mother   . Hypertension Mother   . Hepatitis Mother   . Cancer Mother 26    Breast, cervical  . Diabetes Maternal Grandmother   . Hypertension Maternal Grandmother   . Cancer Maternal Grandmother 40    Breast  . Anesthesia problems Neg Hx   . Other Neg Hx     History  Substance Use Topics  . Smoking status: Former Smoker -- 0.10 packs/day for 10 years    Types: Cigarettes    Quit date: 11/05/2010  . Smokeless tobacco: Never Used     Comment: quit with preg  . Alcohol Use: 0.0 oz/week     Comment: occ     Allergies:  Allergies  Allergen Reactions  . Nubain [Nalbuphine Hcl] Swelling and Palpitations    Prescriptions prior to admission  Medication Sig Dispense Refill  . acetaminophen (TYLENOL) 500 MG tablet Take 1 tablet (500 mg total) by mouth every 6 (six) hours as needed for moderate pain.  30 tablet  0  . Prenatal Vit-Fe Fumarate-FA (PRENATAL MULTIVITAMIN) TABS tablet Take 1 tablet by mouth daily at 12 noon.      . promethazine (PHENERGAN) 25 MG tablet Take 1 tablet (25 mg total) by mouth once.  30 tablet  0    Review of Systems  Constitutional: Negative for fever and malaise/fatigue.  Gastrointestinal: Positive for nausea and vomiting. Negative for abdominal pain.  Genitourinary:       Neg - vaginal bleeding   Physical Exam   Blood pressure 117/65, pulse 76, temperature 98.9 F (37.2 C), temperature source Oral, resp. rate 16, last menstrual period 04/17/2014, not currently breastfeeding.  Physical Exam  Constitutional: She is oriented to person, place, and time. She appears well-developed and well-nourished. No distress.  HENT:  Head: Normocephalic.  Cardiovascular: Normal rate.   Respiratory: Effort normal.  GI: Soft. Bowel sounds are normal. She exhibits no distension and no mass. There is no tenderness. There is no rebound and no guarding.  Neurological: She is alert and oriented to person, place, and time.  Skin: Skin is warm and dry. No erythema.  Psychiatric: She has a normal mood and affect.   Results for orders placed during the hospital encounter of 05/26/14 (from the past 24 hour(s))  URINALYSIS, ROUTINE W REFLEX MICROSCOPIC     Status: Abnormal   Collection Time    05/26/14  6:55 PM      Result Value Ref Range   Color, Urine YELLOW  YELLOW   APPearance CLEAR  CLEAR   Specific Gravity, Urine <1.005 (*) 1.005 - 1.030   pH 6.0  5.0 - 8.0   Glucose, UA NEGATIVE  NEGATIVE mg/dL   Hgb urine dipstick TRACE (*) NEGATIVE   Bilirubin Urine NEGATIVE  NEGATIVE    Ketones, ur NEGATIVE  NEGATIVE mg/dL   Protein, ur NEGATIVE  NEGATIVE mg/dL   Urobilinogen, UA 0.2  0.0 - 1.0 mg/dL   Nitrite NEGATIVE  NEGATIVE   Leukocytes, UA NEGATIVE  NEGATIVE  URINE MICROSCOPIC-ADD ON     Status: Abnormal   Collection Time    05/26/14  6:55 PM      Result Value Ref Range   Squamous Epithelial / LPF RARE  RARE   WBC, UA 0-2  <3 WBC/hpf   RBC / HPF 0-2  <3 RBC/hpf   Bacteria, UA FEW (*) RARE    MAU Course  Procedures None  MDM UA today shows no signs of dehydration Patient is tolerating PO intake at home Patient would like to try increased in Phenergan to 25 mg PO PRN for nausea before other medications Discussed Zofran and recent studies of fetal anomalies at patient's request, she does not wish to try Zofran at this time even though she has used in the past with previous pregnancies  Assessment and Plan  A: Nausea and vomiting in pregnancy prior to [redacted] weeks gestation  P: Discharge home Patient advised to increase Phenergan to 25 mg PO PRN for nausea Discussed diet for hyperemesis Patient advised to follow-up with [facility] as scheduled to start prenatal care Patient may return to MAU as needed or if her condition were to change or worsen  Farris Has, PA-C  05/26/2014, 9:17 PM

## 2014-05-26 NOTE — MAU Note (Signed)
Pt reports nausea and phenergan "is not working". Pt is now feeling dizzy.

## 2014-05-26 NOTE — Discharge Instructions (Signed)
Morning Sickness °Morning sickness is when you feel sick to your stomach (nauseous) during pregnancy. This nauseous feeling may or may not come with vomiting. It often occurs in the morning but can be a problem any time of day. Morning sickness is most common during the first trimester, but it may continue throughout pregnancy. While morning sickness is unpleasant, it is usually harmless unless you develop severe and continual vomiting (hyperemesis gravidarum). This condition requires more intense treatment.  °CAUSES  °The cause of morning sickness is not completely known but seems to be related to normal hormonal changes that occur in pregnancy. °RISK FACTORS °You are at greater risk if you: °· Experienced nausea or vomiting before your pregnancy. °· Had morning sickness during a previous pregnancy. °· Are pregnant with more than one baby, such as twins. °TREATMENT  °Do not use any medicines (prescription, over-the-counter, or herbal) for morning sickness without first talking to your health care provider. Your health care provider may prescribe or recommend: °· Vitamin B6 supplements. °· Anti-nausea medicines. °· The herbal medicine ginger. °HOME CARE INSTRUCTIONS  °· Only take over-the-counter or prescription medicines as directed by your health care provider. °· Taking multivitamins before getting pregnant can prevent or decrease the severity of morning sickness in most women. °· Eat a piece of dry toast or unsalted crackers before getting out of bed in the morning. °· Eat five or six small meals a day. °· Eat dry and bland foods (rice, baked potato). Foods high in carbohydrates are often helpful. °· Do not drink liquids with your meals. Drink liquids between meals. °· Avoid greasy, fatty, and spicy foods. °· Get someone to cook for you if the smell of any food causes nausea and vomiting. °· If you feel nauseous after taking prenatal vitamins, take the vitamins at night or with a snack.  °· Snack on protein  foods (nuts, yogurt, cheese) between meals if you are hungry. °· Eat unsweetened gelatins for desserts. °· Wearing an acupressure wristband (worn for sea sickness) may be helpful. °· Acupuncture may be helpful. °· Do not smoke. °· Get a humidifier to keep the air in your house free of odors. °· Get plenty of fresh air. °SEEK MEDICAL CARE IF:  °· Your home remedies are not working, and you need medicine. °· You feel dizzy or lightheaded. °· You are losing weight. °SEEK IMMEDIATE MEDICAL CARE IF:  °· You have persistent and uncontrolled nausea and vomiting. °· You pass out (faint). °MAKE SURE YOU: °· Understand these instructions. °· Will watch your condition. °· Will get help right away if you are not doing well or get worse. °Document Released: 11/15/2006 Document Revised: 09/29/2013 Document Reviewed: 03/11/2013 °ExitCare® Patient Information ©2015 [employer]. This information is not intended to replace advice given to you by your health care provider. Make sure you discuss any questions you have with your health care provider. ° ° °Eating Plan for Hyperemesis Gravidarum °Severe cases of hyperemesis gravidarum can lead to dehydration and malnutrition. The hyperemesis eating plan is one way to lessen the symptoms of nausea and vomiting. It is often used with prescribed medicines to control your symptoms.  °WHAT CAN I DO TO RELIEVE MY SYMPTOMS? °Listen to your body. Everyone is different and has different preferences. Find what works best for you. Some of the following things may help: °· Eat and drink slowly. °· Eat 5-6 small meals daily instead of 3 large meals.   °· Eat crackers before you get out of bed in the   morning.   °· Starchy foods are usually well tolerated (such as cereal, toast, bread, potatoes, pasta, rice, and pretzels).   °· Ginger may help with nausea. Add ¼ tsp ground ginger to hot tea or choose ginger tea.   °· Try drinking 100% fruit juice or an electrolyte drink. °· Continue to take your  prenatal vitamins as directed by your health care provider. If you are having trouble taking your prenatal vitamins, talk with your health care provider about different options. °· Include at least 1 serving of protein with your meals and snacks (such as meats or poultry, beans, nuts, eggs, or yogurt). Try eating a protein-rich snack before bed (such as cheese and crackers or a half turkey or peanut butter sandwich). °WHAT THINGS SHOULD I AVOID TO REDUCE MY SYMPTOMS? °The following things may help reduce your symptoms: °· Avoid foods with strong smells. Try eating meals in well-ventilated areas that are free of odors. °· Avoid drinking water or other beverages with meals. Try not to drink anything less than 30 minutes before and after meals. °· Avoid drinking more than 1 cup of fluid at a time. °· Avoid fried or high-fat foods, such as butter and cream sauces. °· Avoid spicy foods. °· Avoid skipping meals the best you can. Nausea can be more intense on an empty stomach. If you cannot tolerate food at that time, do not force it. Try sucking on ice chips or other frozen items and make up the calories later. °· Avoid lying down within 2 hours after eating. °Document Released: 07/22/2007 Document Revised: 09/29/2013 Document Reviewed: 07/29/2013 °ExitCare® Patient Information ©2015 [employer]. This information is not intended to replace advice given to you by your health care provider. Make sure you discuss any questions you have with your health care provider. ° ° °

## 2014-05-28 NOTE — MAU Provider Note (Signed)
Attestation of Attending Supervision of Advanced Practitioner: Evaluation and management procedures were performed by the PA/NP/CNM/OB Fellow under my supervision/collaboration. Chart reviewed and agree with management and plan.  Deriyah Kunath V 05/28/2014 8:03 AM

## 2014-06-01 ENCOUNTER — Other Ambulatory Visit: Payer: Self-pay | Admitting: Obstetrics and Gynecology

## 2014-06-02 LAB — CYTOLOGY - PAP

## 2014-06-10 ENCOUNTER — Encounter (HOSPITAL_COMMUNITY): Payer: Self-pay | Admitting: Emergency Medicine

## 2014-06-10 ENCOUNTER — Emergency Department (HOSPITAL_COMMUNITY)
Admission: EM | Admit: 2014-06-10 | Discharge: 2014-06-10 | Disposition: A | Discharge status: Home / Self Care | Payer: Medicare Other | Attending: Emergency Medicine | Admitting: Emergency Medicine

## 2014-06-10 ENCOUNTER — Emergency Department (HOSPITAL_COMMUNITY): Payer: Medicare Other

## 2014-06-10 DIAGNOSIS — Z87891 Personal history of nicotine dependence: Secondary | ICD-10-CM | POA: Insufficient documentation

## 2014-06-10 DIAGNOSIS — Z8619 Personal history of other infectious and parasitic diseases: Secondary | ICD-10-CM | POA: Insufficient documentation

## 2014-06-10 DIAGNOSIS — Z8679 Personal history of other diseases of the circulatory system: Secondary | ICD-10-CM | POA: Diagnosis not present

## 2014-06-10 DIAGNOSIS — R011 Cardiac murmur, unspecified: Secondary | ICD-10-CM | POA: Insufficient documentation

## 2014-06-10 DIAGNOSIS — Z8659 Personal history of other mental and behavioral disorders: Secondary | ICD-10-CM | POA: Diagnosis not present

## 2014-06-10 DIAGNOSIS — Z8742 Personal history of other diseases of the female genital tract: Secondary | ICD-10-CM | POA: Diagnosis not present

## 2014-06-10 DIAGNOSIS — Z862 Personal history of diseases of the blood and blood-forming organs and certain disorders involving the immune mechanism: Secondary | ICD-10-CM | POA: Diagnosis not present

## 2014-06-10 DIAGNOSIS — Z792 Long term (current) use of antibiotics: Secondary | ICD-10-CM | POA: Insufficient documentation

## 2014-06-10 DIAGNOSIS — Z79899 Other long term (current) drug therapy: Secondary | ICD-10-CM | POA: Diagnosis not present

## 2014-06-10 DIAGNOSIS — R1011 Right upper quadrant pain: Secondary | ICD-10-CM | POA: Diagnosis present

## 2014-06-10 DIAGNOSIS — Z8719 Personal history of other diseases of the digestive system: Secondary | ICD-10-CM | POA: Insufficient documentation

## 2014-06-10 DIAGNOSIS — R0789 Other chest pain: Secondary | ICD-10-CM

## 2014-06-10 DIAGNOSIS — R071 Chest pain on breathing: Secondary | ICD-10-CM | POA: Diagnosis not present

## 2014-06-10 LAB — CBC WITH DIFFERENTIAL/PLATELET
BASOS ABS: 0 10*3/uL (ref 0.0–0.1)
BASOS PCT: 0 % (ref 0–1)
Eosinophils Absolute: 0.3 10*3/uL (ref 0.0–0.7)
Eosinophils Relative: 3 % (ref 0–5)
HCT: 30.2 % — ABNORMAL LOW (ref 36.0–46.0)
Hemoglobin: 10.6 g/dL — ABNORMAL LOW (ref 12.0–15.0)
LYMPHS PCT: 24 % (ref 12–46)
Lymphs Abs: 2.4 10*3/uL (ref 0.7–4.0)
MCH: 25.1 pg — ABNORMAL LOW (ref 26.0–34.0)
MCHC: 35.1 g/dL (ref 30.0–36.0)
MCV: 71.6 fL — ABNORMAL LOW (ref 78.0–100.0)
Monocytes Absolute: 0.8 10*3/uL (ref 0.1–1.0)
Monocytes Relative: 9 % (ref 3–12)
Neutro Abs: 6.2 10*3/uL (ref 1.7–7.7)
Neutrophils Relative %: 64 % (ref 43–77)
PLATELETS: 449 10*3/uL — AB (ref 150–400)
RBC: 4.22 MIL/uL (ref 3.87–5.11)
RDW: 17.5 % — AB (ref 11.5–15.5)
WBC: 9.8 10*3/uL (ref 4.0–10.5)

## 2014-06-10 LAB — COMPREHENSIVE METABOLIC PANEL
ALBUMIN: 3.6 g/dL (ref 3.5–5.2)
ALT: 18 U/L (ref 0–35)
AST: 12 U/L (ref 0–37)
Alkaline Phosphatase: 71 U/L (ref 39–117)
Anion gap: 16 — ABNORMAL HIGH (ref 5–15)
BUN: 9 mg/dL (ref 6–23)
CO2: 21 meq/L (ref 19–32)
CREATININE: 0.45 mg/dL — AB (ref 0.50–1.10)
Calcium: 10.1 mg/dL (ref 8.4–10.5)
Chloride: 97 mEq/L (ref 96–112)
GFR calc Af Amer: >90 mL/min (ref >90)
Glucose, Bld: 112 mg/dL — ABNORMAL HIGH (ref 70–99)
Potassium: 4 mEq/L (ref 3.7–5.3)
SODIUM: 134 meq/L — AB (ref 137–147)
Total Bilirubin: 0.2 mg/dL — ABNORMAL LOW (ref 0.3–1.2)
Total Protein: 7.9 g/dL (ref 6.0–8.3)

## 2014-06-10 LAB — URINALYSIS, ROUTINE W REFLEX MICROSCOPIC
Bilirubin Urine: NEGATIVE
GLUCOSE, UA: NEGATIVE mg/dL
KETONES UR: NEGATIVE mg/dL
NITRITE: NEGATIVE
PH: 6.5 (ref 5.0–8.0)
Protein, ur: NEGATIVE mg/dL
SPECIFIC GRAVITY, URINE: 1.017 (ref 1.005–1.030)
Urobilinogen, UA: 1 mg/dL (ref 0.0–1.0)

## 2014-06-10 LAB — URINE MICROSCOPIC-ADD ON

## 2014-06-10 LAB — LIPASE, BLOOD: LIPASE: 22 U/L (ref 11–59)

## 2014-06-10 MED ORDER — ACETAMINOPHEN 325 MG PO TABS
650.0000 mg | ORAL_TABLET | Freq: Once | ORAL | Status: AC
  Administered 2014-06-10: 650 mg via ORAL
  Filled 2014-06-10: qty 2

## 2014-06-10 NOTE — ED Provider Notes (Signed)
CSN: [id number]     Arrival date & time 06/10/14  0626 History   First MD Initiated Contact with Patient 06/10/14 0459     Chief Complaint  Patient presents with  . Abdominal Pain     (Consider location/radiation/quality/duration/timing/severity/associated sxs/prior Treatment) HPI 28 yo female presents to the ER from home with complaint of RUQ pain starting last night around 11 pm.  She denies nausea and vomiting.  Pain is sharp.  Patient reports she has history of gallstones, but is never had a problem.  Patient is currently [redacted] weeks pregnant.  No fevers or chills.  Patient reports that she has not been sticking to her low-fat diet, has been eating fried chicken and well she slipped and a lot of salad dressing. Past Medical History  Diagnosis Date  . Migraine headache   . Ovarian cyst   . Sickle cell trait   . Gallstones 2012  . Abnormal Pap smear   . Chlamydia 2008  . Trichomonas 2008  . Gonorrhea 2008  . Anemia     has required blood transfusion  . Urinary tract infection   . Heart murmur     as child  . Abnormal pap 2009    Colpo - unknown results  . Gallstones   . Depression   . H/O varicella   . Dental caries    Past Surgical History  Procedure Laterality Date  . Colposcopy    . Dilation and curettage of uterus      required blood transfusion   Family History  Problem Relation Age of Onset  . Diabetes Mother   . Hypertension Mother   . Hepatitis Mother   . Cancer Mother 76    Breast, cervical  . Diabetes Maternal Grandmother   . Hypertension Maternal Grandmother   . Cancer Maternal Grandmother 40    Breast  . Anesthesia problems Neg Hx   . Other Neg Hx    History  Substance Use Topics  . Smoking status: Former Smoker -- 0.10 packs/day for 10 years    Types: Cigarettes    Quit date: 11/05/2010  . Smokeless tobacco: Never Used     Comment: quit with preg  . Alcohol Use: 0.0 oz/week     Comment: occ   OB History   Grav Para Term Preterm Abortions TAB  SAB Ect Mult Living   7 4 4  0 1 0 1 0 0 4     Review of Systems  All other systems reviewed and are negative.     Allergies  Nubain  Home Medications   Prior to Admission medications   Medication Sig Start Date End Date Taking? Authorizing Provider  acetaminophen (TYLENOL) 500 MG tablet Take 1 tablet (500 mg total) by mouth every 6 (six) hours as needed for moderate pain. 05/13/14  Yes Merryl Hacker, MD  calcium carbonate (TUMS - DOSED IN MG ELEMENTAL CALCIUM) 500 MG chewable tablet Chew 2 tablets by mouth daily.   Yes Historical Provider, MD  Clindamycin Phosphate, 1 Dose, (CLINDESSE) vaginal cream Apply 1 application topically 2 (two) times daily.   Yes Historical Provider, MD  promethazine (PHENERGAN) 25 MG tablet Take 1 tablet (25 mg total) by mouth once. 05/12/14  Yes Olegario Messier, NP  terconazole (TERAZOL 7) 0.4 % vaginal cream Place 1 applicator vaginally at bedtime. For 7 days 06/09/14   Historical Provider, MD   BP 133/80  Pulse 86  Temp(Src) 98.7 F (37.1 C) (Oral)  Resp 18  Ht  5\' 4"  (1.626 m)  Wt 172 lb (78.019 kg)  BMI 29.51 kg/m2  SpO2 100%  LMP 04/17/2014 Physical Exam  Nursing note and vitals reviewed. Constitutional: She is oriented to person, place, and time. She appears well-developed and well-nourished. No distress.  HENT:  Head: Normocephalic and atraumatic.  Nose: Nose normal.  Mouth/Throat: Oropharynx is clear and moist.  Eyes: Conjunctivae and EOM are normal. Pupils are equal, round, and reactive to light.  Neck: Normal range of motion. Neck supple. No JVD present. No tracheal deviation present. No thyromegaly present.  Cardiovascular: Normal rate, regular rhythm, normal heart sounds and intact distal pulses.  Exam reveals no gallop and no friction rub.   No murmur heard. Pulmonary/Chest: Effort normal and breath sounds normal. No stridor. No respiratory distress. She has no wheezes. She has no rales. She exhibits tenderness (tender to palpation  over right lower ribs).  Abdominal: Soft. Bowel sounds are normal. She exhibits no distension and no mass. There is tenderness (mild right upper quadrant and epigastric pain without rebound or guarding). There is no rebound and no guarding.  Musculoskeletal: Normal range of motion. She exhibits no edema and no tenderness.  Lymphadenopathy:    She has no cervical adenopathy.  Neurological: She is alert and oriented to person, place, and time. She exhibits normal muscle tone. Coordination normal.  Skin: Skin is warm and dry. No rash noted. No erythema. No pallor.  Psychiatric: She has a normal mood and affect. Her behavior is normal. Judgment and thought content normal.    ED Course  Procedures (including critical care time) Labs Review Labs Reviewed  CBC WITH DIFFERENTIAL - Abnormal; Notable for the following:    Hemoglobin 10.6 (*)    HCT 30.2 (*)    MCV 71.6 (*)    MCH 25.1 (*)    RDW 17.5 (*)    Platelets 449 (*)    All other components within normal limits  COMPREHENSIVE METABOLIC PANEL - Abnormal; Notable for the following:    Sodium 134 (*)    Glucose, Bld 112 (*)    Creatinine, Ser 0.45 (*)    Total Bilirubin 0.2 (*)    Anion gap 16 (*)    All other components within normal limits  URINALYSIS, ROUTINE W REFLEX MICROSCOPIC - Abnormal; Notable for the following:    Hgb urine dipstick SMALL (*)    Leukocytes, UA SMALL (*)    All other components within normal limits  LIPASE, BLOOD  URINE MICROSCOPIC-ADD ON    Imaging Review US Abdomen Limited  06/10/2014   CLINICAL DATA:  Pain.  EXAM: US ABDOMEN LIMITED - RIGHT UPPER QUADRANT  COMPARISON:  None.  FINDINGS: Gallbladder:  No gallstones or wall thickening visualized. No sonographic Murphy sign noted.  Common bile duct:  Diameter: 5.0 mm.  Liver:  Increased echogenicity consistent with fatty infiltration.  IMPRESSION: Increased echogenicity of the liver consistent with fatty infiltration.   Electronically Signed   By: Marcello Moores   Register   On: 06/10/2014 07:02     EKG Interpretation None      MDM   Final diagnoses:  Right-sided chest wall pain    28 year old female with right upper quadrant/right lower chest wall pain.  No history of gallstones.  Labs are unremarkable.  Plan for ultrasound or upper quadrant to evaluate for cholecystitis.    Kalman Drape, MD 06/10/14 [phone]

## 2014-06-10 NOTE — Discharge Instructions (Signed)
Your workup today did not show any gallstones or abnormalities in your labs.  Your pain appears to be coming from your chest wall.  Take Tylenol and use heat packs as needed.  Followup with your Dr. for further evaluation if pain continues.  Return emergency room for worsening conditions or new concerning symptoms.   Chest Wall Pain Chest wall pain is pain in or around the bones and muscles of your chest. It may take up to 6 weeks to get better. It may take longer if you must stay physically active in your work and activities.  CAUSES  Chest wall pain may happen on its own. However, it may be caused by:  A viral illness like the flu.  Injury.  Coughing.  Exercise.  Arthritis.  Fibromyalgia.  Shingles. HOME CARE INSTRUCTIONS   Avoid overtiring physical activity. Try not to strain or perform activities that cause pain. This includes any activities using your chest or your abdominal and side muscles, especially if heavy weights are used.  Put ice on the sore area.  Put ice in a plastic bag.  Place a towel between your skin and the bag.  Leave the ice on for 15-20 minutes per hour while awake for the first 2 days.  Only take over-the-counter or prescription medicines for pain, discomfort, or fever as directed by your caregiver. SEEK IMMEDIATE MEDICAL CARE IF:   Your pain increases, or you are very uncomfortable.  You have a fever.  Your chest pain becomes worse.  You have new, unexplained symptoms.  You have nausea or vomiting.  You feel sweaty or lightheaded.  You have a cough with phlegm (sputum), or you cough up blood. MAKE SURE YOU:   Understand these instructions.  Will watch your condition.  Will get help right away if you are not doing well or get worse. Document Released: 09/24/2005 Document Revised: 12/17/2011 Document Reviewed: 05/21/2011 [facility] Patient Information 2015 [place], [place]. This information is not intended to replace advice given to you by  your health care provider. Make sure you discuss any questions you have with your health care provider.

## 2014-06-10 NOTE — ED Notes (Signed)
Pt states she has had gallstones since age 28, states that she has only had 1 flare since-states she is pregnant and due April 9th, states has been eating a lot of fried chicken and salad dressings and states now has pain RUQ and RMQ 8/10.  Denies vomiting-states feeling nauseated.  OB is [facility]

## 2014-06-22 ENCOUNTER — Other Ambulatory Visit: Payer: Self-pay

## 2014-06-22 LAB — OB RESULTS CONSOLE HIV ANTIBODY (ROUTINE TESTING): HIV: NONREACTIVE

## 2014-06-22 LAB — OB RESULTS CONSOLE RUBELLA ANTIBODY, IGM: Rubella: IMMUNE

## 2014-06-22 LAB — OB RESULTS CONSOLE GC/CHLAMYDIA
Chlamydia: NEGATIVE
Gonorrhea: NEGATIVE

## 2014-06-22 LAB — OB RESULTS CONSOLE RPR: RPR: NONREACTIVE

## 2014-06-22 LAB — OB RESULTS CONSOLE ABO/RH: RH Type: POSITIVE

## 2014-06-22 LAB — OB RESULTS CONSOLE HEPATITIS B SURFACE ANTIGEN: Hepatitis B Surface Ag: NEGATIVE

## 2014-06-22 LAB — OB RESULTS CONSOLE ANTIBODY SCREEN: ANTIBODY SCREEN: NEGATIVE

## 2014-07-01 ENCOUNTER — Encounter (HOSPITAL_COMMUNITY): Payer: Self-pay | Admitting: Obstetrics & Gynecology

## 2014-07-07 ENCOUNTER — Encounter: Payer: Medicare Other | Admitting: Obstetrics

## 2014-07-09 ENCOUNTER — Ambulatory Visit (HOSPITAL_COMMUNITY)
Admission: RE | Admit: 2014-07-09 | Discharge: 2014-07-09 | Disposition: A | Discharge status: Home / Self Care | Payer: Medicare Other | Source: Ambulatory Visit | Attending: Obstetrics & Gynecology | Admitting: Obstetrics & Gynecology

## 2014-07-09 NOTE — Progress Notes (Signed)
Genetic Counseling  High-Risk Gestation Note  Appointment Date:  07/09/2014 Referred By: Tiffany Hedges, DO Date of Birth:  1986/09/14 Partner:  Tiffany Hall  Pregnancy History: [id number]  Estimated Date of Delivery: 01/22/15  Gestational age: [redacted]w[redacted]d    [name] and her partner, Mr. 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 Tiffany Hall hemoglobin electrophoresis from 06/22/14 reported 35.5% of hemoglobin C and 61.6% of hemoglobin A as well as 39.1% of hemoglobin C and 58.3% of hemoglobin A. The average values that hemoglobin S (or C) are reported to be reduced in the presence of 3 alpha globin genes is reported to be 30-35% and reported to be reduced to 25-30% in the presence of two alpha globin genes.   Both family histories were reviewed and found to be noncontributory for birth defects, intellectual disability, and known genetic conditions, including sickle cell anemia or thalassemia. Without further information regarding the provided family history, an accurate genetic risk cannot be calculated. Further genetic counseling is warranted if more information is obtained.  They were counseled that hemoglobin is a protein in the RBCs that carries oxygen to the body's organs.The type of hemoglobin we have is determined by inheritance.  Most people  typically inherit hemoglobin A.  Other variant types of hemoglobin include hemoglobin C or hemoglobin S (sickle cell trait).  Individuals who have hemoglobinopathies have a changes within the genes that codes for hemoglobin. We discussed that hemoglobin C, when combined with other variants, such as hemoglobin S, can cause a medically significant hemoglobinopathy. However, we discussed that Hemoglobin C disease (Hb CC) is generally a benign condition, although it can be associated with mild hemolytic anemia and/or splenomegaly (enlarged spleen).    We reviewed genes, chromosomes, and autosomal recessive inheritance. We reviewed that hemoglobinopathies are inherited in an autosomal recessive manner, and occurs when both copies of the hemoglobin gene are changed and produce an abnormal hemoglobin. Typically, one abnormal gene for the production of hemoglobin is inherited from each parent. A carrier of hemoglobin C has one altered copy of the gene for hemoglobin and one typical working copy. Carriers of recessive conditions typically do not have symptoms related to the condition because they still have one functioning copy of the gene, and thus some production of the typical protein coded for by that gene.  We discussed that alpha thalassemia is different in its inheritance compared to other hemoglobinopathies as there are two alpha globin genes on each chromosome 16 (aa/aa).  A person can be a carrier of one alpha gene mutation (aa/a-), also referred to as a "silent carrier" or of more than one mutation.  A person who carriers two alpha globin gene mutations can either carry them in cis (both on the same chromosome, denoted as aa/--) or in trans (on different chromosomes, denoted as a-/a-) .  We discussed that alpha thalassemia carriers of two mutations who have African  ancestry are more likely to have a trans arrangement (a-/a-), and individuals with Asian ancestry who are alpha thalassemia carriers usually have a  cis arrangement (aa/--) of their alpha globin gene mutations.  Alpha-thalassemia carriers with cis configuration is reported to be rare in individuals with African ancestry. We discussed that given the pattern on Tiffany Hall's hemoglobin electrophoresis, if [name] is a carrier for alpha-thalassemia, it is more likely to be a carrier of 3 alpha globin genes ("silent carrier"), aa/a- but could be 2 alpha globin genes. If she is a carrier of two alpha globin genes, then she is more likely to be a carrier in trans (a-/a-). Given that the father of the pregnancy has no known family history of alpha thalassemia and no consanguinity to Tiffany Hall, he would have the general population risk to be an alpha thalassemia carrier.    We discussed different forms of alpha thalassemia. The most severe form of alpha thalassemia, hydrops fetalis with Hb Barts, is associated with an absence of alpha globin chain synthesis as a result of deletions of all four alpha globin genes (--/--).  If [name] is a silent carrier (aa/a-), her pregnancies would not be at increased risk for hemoglobin Bart hydrops fetalis, even if her partner is a carrier for alpha thalassemia. Hemoglobin H (HbH) disease is caused by three deleted or dysfunctioning alpha globin alleles (a-/--) and is characterized by microcytic hypochromic hemolytic anemia, hepatosplenomegaly, mild jaundice, and sometimes thalassemia-like bone changes. The current pregnancy would only be at risk for Hemoglobin H disease (a-/--), if both partners are carriers of two alpha globin deletions and if at least one is a carrier for two alpha globin mutations in cis (aa/--), in which case the risk for hemoglobin H disease in the pregnancy would be 1 in 4 (25%). Given the patient's reported ethnicity, if they are carriers for two alpha globin mutations, then both would be more likely to carry them in the trans configuration. If both parents carry alpha thalassemia in  trans (a-/a-), then all pregnancies together would be expected to have alpha thalassemia trait (a-/a-).    Given the recessive inheritance, we discussed the importance of understanding Mr. Goethe's carrier status in order to accurately predict the risk of a hemoglobinopathy in the fetus. Mr. Badders reported that he has been told that he does not have sickle cell trait. He did not have medical records available to verify the reported information. We discussed the option of repeat testing (hemoglobin electrophoresis) to determine whether he has any hemoglobin variant, including sickle cell trait. We discussed that given his reported family history, he would have the general population risk to have sickle cell trait (1 in 12) and hemoglobin C trait (1 in 30), if he has not previously had screening. The couple understands that if Mr. Franko does not have a hemoglobin variant, then the pregnancy would not be at increased risk for a hemoglobinopathy. We reviewed that when both parents are identified to be carriers for hemoglobin variants, prenatal diagnosis would be available via amniocentesis. The risks, benefits, and limitations of amniocentesis were reviewed. [name] stated that she would not be interested in amniocentesis in the pregnancy for a hemoglobinopathy even if Mr. Mccay was identified to carry a hemoglobin variant.  He declined testing for hemoglobin variants today. Additionally, molecular testing is available to Tiffany Hall to assess her carrier status for alpha thalassemia. [name] stated that she was not interested in additional testing at this time  regarding hemoglobinopathies. They understand that an accurate risk assessment cannot be performed without further information. We reviewed that targeted ultrasound is available in pregnancy to assess fetal growth and development but that a pregnancy with a hemoglobinopathy would not typically be expected to have physical differences prenatally.  We  also reviewed the availability of newborn screening in [place] for hemoglobinopathies.   [name] denied exposure to environmental toxins or chemical agents. She denied the use of alcohol, tobacco or street drugs. She denied significant viral illnesses during the course of her pregnancy. Her medical and surgical histories were noncontributory.   I counseled this couple regarding the above risks and available options.  The approximate face-to-face time with the genetic counselor was 40 minutes.  Tiffany Oman, MS,  Certified Genetic Counselor 07/09/2014

## 2014-07-11 ENCOUNTER — Inpatient Hospital Stay (HOSPITAL_COMMUNITY)
Admission: AD | Admit: 2014-07-11 | Discharge: 2014-07-11 | Discharge status: Against Medical Advice | Payer: Medicare Other | Source: Ambulatory Visit | Attending: Obstetrics and Gynecology | Admitting: Obstetrics and Gynecology

## 2014-07-11 DIAGNOSIS — O9989 Other specified diseases and conditions complicating pregnancy, childbirth and the puerperium: Secondary | ICD-10-CM | POA: Insufficient documentation

## 2014-07-11 DIAGNOSIS — W501XXA Accidental kick by another person, initial encounter: Secondary | ICD-10-CM | POA: Diagnosis not present

## 2014-07-11 DIAGNOSIS — R109 Unspecified abdominal pain: Secondary | ICD-10-CM | POA: Diagnosis not present

## 2014-07-11 LAB — URINALYSIS, ROUTINE W REFLEX MICROSCOPIC
BILIRUBIN URINE: NEGATIVE
Glucose, UA: NEGATIVE mg/dL
KETONES UR: NEGATIVE mg/dL
Leukocytes, UA: NEGATIVE
NITRITE: NEGATIVE
PROTEIN: NEGATIVE mg/dL
Specific Gravity, Urine: 1.02 (ref 1.005–1.030)
UROBILINOGEN UA: 2 mg/dL — AB (ref 0.0–1.0)
pH: 6.5 (ref 5.0–8.0)

## 2014-07-11 LAB — URINE MICROSCOPIC-ADD ON
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
## 2014-07-11 NOTE — MAU Note (Signed)
Pt signed out AMA. States "ok" because she heard fetal heart rate in triage & doesn't want to wait b/c there are so many people waiting in the lobby. AMA form signed & placed on chart.

## 2014-08-09 ENCOUNTER — Encounter (HOSPITAL_COMMUNITY): Payer: Self-pay | Admitting: Emergency Medicine

## 2014-08-11 ENCOUNTER — Inpatient Hospital Stay (HOSPITAL_COMMUNITY)
Admission: AD | Admit: 2014-08-11 | Discharge: 2014-08-11 | Disposition: A | Discharge status: Home / Self Care | Payer: Medicare Other | Source: Ambulatory Visit | Attending: Obstetrics and Gynecology | Admitting: Obstetrics and Gynecology

## 2014-08-11 ENCOUNTER — Encounter (HOSPITAL_COMMUNITY): Payer: Self-pay | Admitting: *Deleted

## 2014-08-11 DIAGNOSIS — Z87891 Personal history of nicotine dependence: Secondary | ICD-10-CM | POA: Diagnosis not present

## 2014-08-11 DIAGNOSIS — Z3A16 16 weeks gestation of pregnancy: Secondary | ICD-10-CM | POA: Insufficient documentation

## 2014-08-11 DIAGNOSIS — N898 Other specified noninflammatory disorders of vagina: Secondary | ICD-10-CM | POA: Diagnosis present

## 2014-08-11 DIAGNOSIS — O98312 Other infections with a predominantly sexual mode of transmission complicating pregnancy, second trimester: Secondary | ICD-10-CM | POA: Insufficient documentation

## 2014-08-11 DIAGNOSIS — O26899 Other specified pregnancy related conditions, unspecified trimester: Secondary | ICD-10-CM

## 2014-08-11 DIAGNOSIS — A599 Trichomoniasis, unspecified: Secondary | ICD-10-CM

## 2014-08-11 DIAGNOSIS — A5901 Trichomonal vulvovaginitis: Secondary | ICD-10-CM | POA: Diagnosis not present

## 2014-08-11 DIAGNOSIS — R109 Unspecified abdominal pain: Secondary | ICD-10-CM | POA: Insufficient documentation

## 2014-08-11 LAB — URINALYSIS, ROUTINE W REFLEX MICROSCOPIC
Bilirubin Urine: NEGATIVE
GLUCOSE, UA: NEGATIVE mg/dL
Ketones, ur: 15 mg/dL — AB
Leukocytes, UA: NEGATIVE
Nitrite: NEGATIVE
PH: 6 (ref 5.0–8.0)
Protein, ur: NEGATIVE mg/dL
Urobilinogen, UA: 4 mg/dL — ABNORMAL HIGH (ref 0.0–1.0)

## 2014-08-11 LAB — URINE MICROSCOPIC-ADD ON

## 2014-08-11 LAB — WET PREP, GENITAL
Clue Cells Wet Prep HPF POC: NONE SEEN
YEAST WET PREP: NONE SEEN

## 2014-08-11 MED ORDER — METRONIDAZOLE 500 MG PO TABS
2000.0000 mg | ORAL_TABLET | Freq: Once | ORAL | Status: AC
  Administered 2014-08-11: 2000 mg via ORAL
  Filled 2014-08-11: qty 4

## 2014-08-11 MED ORDER — ACETAMINOPHEN 325 MG PO TABS
650.0000 mg | ORAL_TABLET | Freq: Once | ORAL | Status: AC
  Administered 2014-08-11: 650 mg via ORAL
  Filled 2014-08-11: qty 2

## 2014-08-11 NOTE — Discharge Instructions (Signed)

## 2014-08-11 NOTE — MAU Note (Signed)
Tomi Bamberger PA in to discuss results with pt. Apart from partner at this time.

## 2014-08-11 NOTE — MAU Provider Note (Signed)
History     CSN: [id number]  Arrival date and time: 08/11/14 0002   First Provider Initiated Contact with Patient 08/11/14 0036      Chief Complaint  Patient presents with  . Abdominal Pain  . Vaginal Discharge   HPI [name] is a 28 y.o. [id number] at [redacted]w[redacted]d who presents to MAU today with complaint of possible LOF. The patient states that she noted a small amount of clear-white mucus discharge today. She also states off and on dull aching pain of the abdomen that is diffuse, but worse in the lower abdomen since yesterday. She denies vaginal bleeding, UTI symptoms, vomiting, diarrhea or recent sexual activity. She endorses mild nausea earlier today.   OB History    Gravida Para Term Preterm AB TAB SAB Ectopic Multiple Living   6 4 4  0 1 0 1 0 0 4      Past Medical History  Diagnosis Date  . Migraine headache   . Ovarian cyst   . Sickle cell trait   . Gallstones 2012  . Abnormal Pap smear   . Chlamydia 2008  . Trichomonas 2008  . Gonorrhea 2008  . Anemia     has required blood transfusion  . Urinary tract infection   . Heart murmur     as child  . Abnormal pap 2009    Colpo - unknown results  . Gallstones   . Depression   . H/O varicella   . Dental caries     Past Surgical History  Procedure Laterality Date  . Colposcopy    . Dilation and curettage of uterus      required blood transfusion    Family History  Problem Relation Age of Onset  . Diabetes Mother   . Hypertension Mother   . Hepatitis Mother   . Cancer Mother 83    Breast, cervical  . Diabetes Maternal Grandmother   . Hypertension Maternal Grandmother   . Cancer Maternal Grandmother 40    Breast  . Anesthesia problems Neg Hx   . Other Neg Hx     History  Substance Use Topics  . Smoking status: Former Smoker -- 0.10 packs/day for 10 years    Types: Cigarettes    Quit date: 11/05/2010  . Smokeless tobacco: Never Used     Comment: quit with preg  . Alcohol Use: No     Comment:  occ    Allergies:  Allergies  Allergen Reactions  . Nubain [Nalbuphine Hcl] Swelling and Palpitations    Prescriptions prior to admission  Medication Sig Dispense Refill Last Dose  . acetaminophen (TYLENOL) 500 MG tablet Take 1 tablet (500 mg total) by mouth every 6 (six) hours as needed for moderate pain. 30 tablet 0 Past Week at Unknown time  . calcium carbonate (TUMS - DOSED IN MG ELEMENTAL CALCIUM) 500 MG chewable tablet Chew 2 tablets by mouth daily.   Past Week at Unknown time  . Clindamycin Phosphate, 1 Dose, (CLINDESSE) vaginal cream Apply 1 application topically 2 (two) times daily.   Past Week at Unknown time  . promethazine (PHENERGAN) 25 MG tablet Take 1 tablet (25 mg total) by mouth once. 30 tablet 0 06/09/2014 at Unknown time  . terconazole (TERAZOL 7) 0.4 % vaginal cream Place 1 applicator vaginally at bedtime. For 7 days   has not picked up    Review of Systems  Constitutional: Negative for fever and malaise/fatigue.  Gastrointestinal: Positive for nausea, abdominal pain and constipation. Negative  for vomiting and diarrhea.  Genitourinary: Negative for dysuria, urgency and frequency.       Neg - vaginal bleeding + vaginal discharge   Physical Exam   Blood pressure 115/65, pulse 96, temperature 98.9 F (37.2 C), temperature source Oral, resp. rate 18, height 5\' 4"  (1.626 m), weight 175 lb 12.8 oz (79.742 kg), last menstrual period 04/17/2014.  Physical Exam  Constitutional: She appears well-developed and well-nourished. No distress.  HENT:  Head: Normocephalic.  Cardiovascular: Normal rate.   Respiratory: Effort normal.  GI: Soft. She exhibits no distension and no mass. There is tenderness (mild diffuse tenderness to palpation). There is no rebound and no guarding.  Genitourinary: Uterus is enlarged (appropriate for GA). Cervix exhibits no motion tenderness, no discharge and no friability. No bleeding in the vagina. Vaginal discharge (small amount of yellow mucus  discharge noted) found.  Cervix visually closed Negative pooling  Neurological: She is alert.  Skin: Skin is warm and dry. No erythema.  Psychiatric: She has a normal mood and affect.   Results for orders placed or performed during the hospital encounter of 08/11/14 (from the past 24 hour(s))  Urinalysis, Routine w reflex microscopic     Status: Abnormal   Collection Time: 08/11/14 12:20 AM  Result Value Ref Range   Color, Urine YELLOW YELLOW   APPearance CLEAR CLEAR   Specific Gravity, Urine >1.030 (H) 1.005 - 1.030   pH 6.0 5.0 - 8.0   Glucose, UA NEGATIVE NEGATIVE mg/dL   Hgb urine dipstick MODERATE (A) NEGATIVE   Bilirubin Urine NEGATIVE NEGATIVE   Ketones, ur 15 (A) NEGATIVE mg/dL   Protein, ur NEGATIVE NEGATIVE mg/dL   Urobilinogen, UA 4.0 (H) 0.0 - 1.0 mg/dL   Nitrite NEGATIVE NEGATIVE   Leukocytes, UA NEGATIVE NEGATIVE  Urine microscopic-add on     Status: Abnormal   Collection Time: 08/11/14 12:20 AM  Result Value Ref Range   Squamous Epithelial / LPF RARE RARE   WBC, UA 0-2 <3 WBC/hpf   RBC / HPF 7-10 <3 RBC/hpf   Bacteria, UA FEW (A) RARE   Urine-Other TRICHOMONAS PRESENT   Wet prep, genital     Status: Abnormal   Collection Time: 08/11/14 12:45 AM  Result Value Ref Range   Yeast Wet Prep HPF POC NONE SEEN NONE SEEN   Trich, Wet Prep RARE (A) NONE SEEN   Clue Cells Wet Prep HPF POC NONE SEEN NONE SEEN   WBC, Wet Prep HPF POC FEW (A) NONE SEEN     MAU Course  Procedures None  MDM UA and wet prep today Discussed patient with Dr. Matthew Saras. Ok to treat wet prep results and have patient follow-up as scheduled  Assessment and Plan  A: [facility]UP at [redacted]w[redacted]d Trichomonas infection Abdominal pain in pregnancy  P: Discharge home Patient treated with Flagyl in MAU Partner treatment advised Patient encouraged to keep follow-up appointment as scheduled for routine prenatal care with [facility] Patient may return to MAU as needed or if her condition were to  change or worsen   Luvenia Redden, PA-C  08/11/2014, 1:07 AM

## 2014-08-11 NOTE — MAU Note (Signed)
Pt reports she has been having abd pain on and off all day. Noticed she was leaking clear fluid  Around 5:30 this evening.

## 2014-08-20 ENCOUNTER — Emergency Department (HOSPITAL_COMMUNITY): Payer: No Typology Code available for payment source

## 2014-08-20 ENCOUNTER — Emergency Department (HOSPITAL_COMMUNITY)
Admission: EM | Admit: 2014-08-20 | Discharge: 2014-08-20 | Disposition: A | Discharge status: Home / Self Care | Payer: No Typology Code available for payment source | Attending: Emergency Medicine | Admitting: Emergency Medicine

## 2014-08-20 ENCOUNTER — Encounter (HOSPITAL_COMMUNITY): Payer: Self-pay

## 2014-08-20 DIAGNOSIS — Z3A19 19 weeks gestation of pregnancy: Secondary | ICD-10-CM | POA: Insufficient documentation

## 2014-08-20 DIAGNOSIS — Z8659 Personal history of other mental and behavioral disorders: Secondary | ICD-10-CM | POA: Diagnosis not present

## 2014-08-20 DIAGNOSIS — O99412 Diseases of the circulatory system complicating pregnancy, second trimester: Secondary | ICD-10-CM | POA: Diagnosis not present

## 2014-08-20 DIAGNOSIS — R109 Unspecified abdominal pain: Secondary | ICD-10-CM

## 2014-08-20 DIAGNOSIS — Z8619 Personal history of other infectious and parasitic diseases: Secondary | ICD-10-CM | POA: Diagnosis not present

## 2014-08-20 DIAGNOSIS — S199XXA Unspecified injury of neck, initial encounter: Secondary | ICD-10-CM | POA: Insufficient documentation

## 2014-08-20 DIAGNOSIS — Z8719 Personal history of other diseases of the digestive system: Secondary | ICD-10-CM | POA: Insufficient documentation

## 2014-08-20 DIAGNOSIS — S3991XA Unspecified injury of abdomen, initial encounter: Secondary | ICD-10-CM | POA: Insufficient documentation

## 2014-08-20 DIAGNOSIS — Y92410 Unspecified street and highway as the place of occurrence of the external cause: Secondary | ICD-10-CM | POA: Diagnosis not present

## 2014-08-20 DIAGNOSIS — Z79899 Other long term (current) drug therapy: Secondary | ICD-10-CM | POA: Diagnosis not present

## 2014-08-20 DIAGNOSIS — Z862 Personal history of diseases of the blood and blood-forming organs and certain disorders involving the immune mechanism: Secondary | ICD-10-CM | POA: Diagnosis not present

## 2014-08-20 DIAGNOSIS — Z87891 Personal history of nicotine dependence: Secondary | ICD-10-CM | POA: Diagnosis not present

## 2014-08-20 DIAGNOSIS — M542 Cervicalgia: Secondary | ICD-10-CM

## 2014-08-20 DIAGNOSIS — Y998 Other external cause status: Secondary | ICD-10-CM | POA: Diagnosis not present

## 2014-08-20 DIAGNOSIS — Y9389 Activity, other specified: Secondary | ICD-10-CM | POA: Diagnosis not present

## 2014-08-20 DIAGNOSIS — Z87440 Personal history of urinary (tract) infections: Secondary | ICD-10-CM | POA: Diagnosis not present

## 2014-08-20 DIAGNOSIS — O9A212 Injury, poisoning and certain other consequences of external causes complicating pregnancy, second trimester: Secondary | ICD-10-CM | POA: Insufficient documentation

## 2014-08-20 DIAGNOSIS — R011 Cardiac murmur, unspecified: Secondary | ICD-10-CM | POA: Diagnosis not present

## 2014-08-20 DIAGNOSIS — S3992XA Unspecified injury of lower back, initial encounter: Secondary | ICD-10-CM | POA: Insufficient documentation

## 2014-08-20 MED ORDER — ACETAMINOPHEN 325 MG PO TABS
650.0000 mg | ORAL_TABLET | Freq: Once | ORAL | Status: AC
  Administered 2014-08-20: 650 mg via ORAL
  Filled 2014-08-20: qty 2

## 2014-08-20 MED ORDER — HYDROCODONE-ACETAMINOPHEN 5-325 MG PO TABS
1.0000 | ORAL_TABLET | Freq: Once | ORAL | Status: AC
  Administered 2014-08-20: 1 via ORAL
  Filled 2014-08-20: qty 1
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
## 2014-08-20 NOTE — ED Notes (Signed)
Pt voicing concerns regarding imaging. Dr. Mingo Amber made aware. Xray at bedside as well.
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
## 2014-08-20 NOTE — ED Notes (Signed)
MD at the bedside  
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
## 2014-08-20 NOTE — ED Notes (Signed)
Spoke with [facility], and Junie Panning states if we speak with Dr Lynnette Caffey and he wants her to come she will and to call back.

## 2014-08-20 NOTE — ED Notes (Signed)
Patient returned from MRI.

## 2014-08-28 ENCOUNTER — Encounter (HOSPITAL_COMMUNITY): Payer: Self-pay | Admitting: *Deleted

## 2014-08-28 ENCOUNTER — Inpatient Hospital Stay (HOSPITAL_COMMUNITY)
Admission: AD | Admit: 2014-08-28 | Discharge: 2014-08-28 | Disposition: A | Discharge status: Home / Self Care | Payer: Medicare Other | Source: Ambulatory Visit | Attending: Obstetrics and Gynecology | Admitting: Obstetrics and Gynecology

## 2014-08-28 DIAGNOSIS — R109 Unspecified abdominal pain: Secondary | ICD-10-CM

## 2014-08-28 DIAGNOSIS — O26892 Other specified pregnancy related conditions, second trimester: Secondary | ICD-10-CM | POA: Diagnosis not present

## 2014-08-28 DIAGNOSIS — Z87891 Personal history of nicotine dependence: Secondary | ICD-10-CM | POA: Insufficient documentation

## 2014-08-28 DIAGNOSIS — O26899 Other specified pregnancy related conditions, unspecified trimester: Secondary | ICD-10-CM

## 2014-08-28 DIAGNOSIS — O9989 Other specified diseases and conditions complicating pregnancy, childbirth and the puerperium: Secondary | ICD-10-CM

## 2014-08-28 DIAGNOSIS — Z3A20 20 weeks gestation of pregnancy: Secondary | ICD-10-CM | POA: Diagnosis not present

## 2014-08-28 LAB — URINE MICROSCOPIC-ADD ON

## 2014-08-28 LAB — URINALYSIS, ROUTINE W REFLEX MICROSCOPIC
Bilirubin Urine: NEGATIVE
GLUCOSE, UA: NEGATIVE mg/dL
Ketones, ur: NEGATIVE mg/dL
Nitrite: NEGATIVE
PH: 7 (ref 5.0–8.0)
Protein, ur: NEGATIVE mg/dL
SPECIFIC GRAVITY, URINE: 1.02 (ref 1.005–1.030)
Urobilinogen, UA: 2 mg/dL — ABNORMAL HIGH (ref 0.0–1.0)

## 2014-08-28 LAB — WET PREP, GENITAL
Clue Cells Wet Prep HPF POC: NONE SEEN
Trich, Wet Prep: NONE SEEN
YEAST WET PREP: NONE SEEN

## 2014-08-28 NOTE — MAU Provider Note (Signed)
History     CSN: [id number]  Arrival date and time: 08/28/14 1549   First Provider Initiated Contact with Patient 08/28/14 1614      Chief Complaint  Patient presents with  . Abdominal Pain   HPI Tiffany Hall 28 y.o. [redacted]w[redacted]d  Comes to MAU with pain in her sides at the level of the umbilicus which has been occurring for about 1-2 hours and she is having difficutly breathing with this pain.  Client was shopping and walking in [employer] when this pain started.  Frightened her that it was severe enough that she was having difficulty breathing.  While resting in bed in MAU, pain in her sides has been much less.  Hx of preterm labor in previous pregnancies which was stopped and her babies were born at term.   OB History    Gravida Para Term Preterm AB TAB SAB Ectopic Multiple Living   6 4 4  0 1 0 1 0 0 4      Past Medical History  Diagnosis Date  . Migraine headache   . Ovarian cyst   . Sickle cell trait   . Gallstones 2012  . Abnormal Pap smear   . Chlamydia 2008  . Trichomonas 2008  . Gonorrhea 2008  . Anemia     has required blood transfusion  . Urinary tract infection   . Heart murmur     as child  . Abnormal pap 2009    Colpo - unknown results  . Gallstones   . Depression   . H/O varicella   . Dental caries     Past Surgical History  Procedure Laterality Date  . Colposcopy    . Dilation and curettage of uterus      required blood transfusion    Family History  Problem Relation Age of Onset  . Diabetes Mother   . Hypertension Mother   . Hepatitis Mother   . Cancer Mother 44    Breast, cervical  . Diabetes Maternal Grandmother   . Hypertension Maternal Grandmother   . Cancer Maternal Grandmother 40    Breast  . Anesthesia problems Neg Hx   . Other Neg Hx     History  Substance Use Topics  . Smoking status: Former Smoker -- 0.10 packs/day for 10 years    Types: Cigarettes    Quit date: 11/05/2010  . Smokeless tobacco: Never Used     Comment:  quit with preg  . Alcohol Use: No     Comment: occ    Allergies:  Allergies  Allergen Reactions  . Nubain [Nalbuphine Hcl] Swelling and Palpitations    Prescriptions prior to admission  Medication Sig Dispense Refill Last Dose  . acetaminophen (TYLENOL) 500 MG tablet Take 1 tablet (500 mg total) by mouth every 6 (six) hours as needed for moderate pain. 30 tablet 0 08/27/2014 at Unknown time  . calcium carbonate (TUMS - DOSED IN MG ELEMENTAL CALCIUM) 500 MG chewable tablet Chew 2 tablets by mouth 4 (four) times daily as needed for indigestion.    Past Week at Unknown time  . Prenatal Vit-Fe Fumarate-FA (PRENATAL MULTIVITAMIN) TABS tablet Take 1 tablet by mouth daily.    08/27/2014 at Unknown time  . promethazine (PHENERGAN) 25 MG tablet Take 1 tablet (25 mg total) by mouth once. (Patient taking differently: Take 25 mg by mouth every 4 (four) hours as needed for nausea or vomiting. ) 30 tablet 0 2 months    Review of Systems  Constitutional:  Negative for fever.  Gastrointestinal: Positive for abdominal pain. Negative for nausea and vomiting.       Pain was on her sides.  Genitourinary:       No vaginal discharge. No vaginal bleeding. No dysuria. No leaking.   Physical Exam   Blood pressure 123/68, pulse 89, temperature 98.3 F (36.8 C), temperature source Oral, resp. rate 18, height 5\' 5"  (1.651 m), weight 175 lb (79.379 kg), last menstrual period 04/17/2014.  Physical Exam  Nursing note and vitals reviewed. Constitutional: She is oriented to person, place, and time. She appears well-developed and well-nourished. No distress.  HENT:  Head: Normocephalic.  Eyes: EOM are normal.  Neck: Neck supple.  GI: Soft. There is no tenderness.  No tightening of uterus noted by examiner  Genitourinary:  Speculum exam: Vagina - Mod amount of creamy discharge, no odor Cervix - No contact bleeding Bimanual exam: Cervix closed and thick Uterus non tender, 20 week Adnexa non tender, no  masses bilaterally wet prep done Chaperone present for exam.  Musculoskeletal: Normal range of motion.  Neurological: She is alert and oriented to person, place, and time.  Skin: Skin is warm and dry.  Psychiatric: She has a normal mood and affect.    MAU Course  Procedures  MDM Results for orders placed or performed during the hospital encounter of 08/28/14 (from the past 24 hour(s))  Urinalysis, Routine w reflex microscopic     Status: Abnormal   Collection Time: 08/28/14  3:55 PM  Result Value Ref Range   Color, Urine YELLOW YELLOW   APPearance CLEAR CLEAR   Specific Gravity, Urine 1.020 1.005 - 1.030   pH 7.0 5.0 - 8.0   Glucose, UA NEGATIVE NEGATIVE mg/dL   Hgb urine dipstick MODERATE (A) NEGATIVE   Bilirubin Urine NEGATIVE NEGATIVE   Ketones, ur NEGATIVE NEGATIVE mg/dL   Protein, ur NEGATIVE NEGATIVE mg/dL   Urobilinogen, UA 2.0 (H) 0.0 - 1.0 mg/dL   Nitrite NEGATIVE NEGATIVE   Leukocytes, UA SMALL (A) NEGATIVE  Urine microscopic-add on     Status: Abnormal   Collection Time: 08/28/14  3:55 PM  Result Value Ref Range   Squamous Epithelial / LPF FEW (A) RARE   WBC, UA 3-6 <3 WBC/hpf   RBC / HPF 3-6 <3 RBC/hpf   Bacteria, UA FEW (A) RARE  Wet prep, genital     Status: Abnormal   Collection Time: 08/28/14  4:20 PM  Result Value Ref Range   Yeast Wet Prep HPF POC NONE SEEN NONE SEEN   Trich, Wet Prep NONE SEEN NONE SEEN   Clue Cells Wet Prep HPF POC NONE SEEN NONE SEEN   WBC, Wet Prep HPF POC MANY (A) NONE SEEN   Was treated for trich at last visit in MAU.  Discharge is gone now.  No itching or burning.  Partner was treated. Urine culture pending. Consult with Dr. Julien Girt re: plan of care  Assessment and Plan  Abdominal pain in pregnancy - now resolved  Plan Drink at least 8 8-oz glasses of water every day. Call your doctor if your symptoms worsen. Keep your appointments in the office. Return if you have severe pain, vaginal bleeding or leaking of  fluid.  BURLESON,TERRI 08/28/2014, 4:40 PM

## 2014-08-28 NOTE — Discharge Instructions (Signed)
Drink at least 8 8-oz glasses of water every day. Call your doctor if your symptoms worsen. Keep your appointments in the office. Return if you have severe pain, vaginal bleeding or leaking of fluid.

## 2014-08-28 NOTE — MAU Note (Signed)
Patient presents with complaint of abdominal and side tightening X 11/2-2 hours.

## 2014-08-29 LAB — URINE CULTURE: Colony Count: 75000

## 2014-10-03 ENCOUNTER — Inpatient Hospital Stay (HOSPITAL_COMMUNITY)
Admission: AD | Admit: 2014-10-03 | Discharge: 2014-10-03 | Disposition: A | Discharge status: Home / Self Care | Payer: Medicare Other | Source: Ambulatory Visit | Attending: Obstetrics and Gynecology | Admitting: Obstetrics and Gynecology

## 2014-10-03 ENCOUNTER — Encounter (HOSPITAL_COMMUNITY): Payer: Self-pay | Admitting: *Deleted

## 2014-10-03 DIAGNOSIS — O98812 Other maternal infectious and parasitic diseases complicating pregnancy, second trimester: Secondary | ICD-10-CM | POA: Diagnosis not present

## 2014-10-03 DIAGNOSIS — B373 Candidiasis of vulva and vagina: Secondary | ICD-10-CM | POA: Insufficient documentation

## 2014-10-03 DIAGNOSIS — O4702 False labor before 37 completed weeks of gestation, second trimester: Secondary | ICD-10-CM

## 2014-10-03 DIAGNOSIS — B3731 Acute candidiasis of vulva and vagina: Secondary | ICD-10-CM

## 2014-10-03 DIAGNOSIS — Z3A25 25 weeks gestation of pregnancy: Secondary | ICD-10-CM | POA: Diagnosis not present

## 2014-10-03 DIAGNOSIS — Z87891 Personal history of nicotine dependence: Secondary | ICD-10-CM | POA: Insufficient documentation

## 2014-10-03 DIAGNOSIS — O479 False labor, unspecified: Secondary | ICD-10-CM

## 2014-10-03 LAB — URINE MICROSCOPIC-ADD ON

## 2014-10-03 LAB — WET PREP, GENITAL
Clue Cells Wet Prep HPF POC: NONE SEEN
Trich, Wet Prep: NONE SEEN

## 2014-10-03 LAB — URINALYSIS, ROUTINE W REFLEX MICROSCOPIC
Bilirubin Urine: NEGATIVE
Glucose, UA: NEGATIVE mg/dL
Ketones, ur: 40 mg/dL — AB
Nitrite: NEGATIVE
PROTEIN: NEGATIVE mg/dL
Specific Gravity, Urine: >1.03 — ABNORMAL HIGH (ref 1.005–1.030)
Urobilinogen, UA: 1 mg/dL (ref 0.0–1.0)
pH: 6 (ref 5.0–8.0)

## 2014-10-03 MED ORDER — TERCONAZOLE 0.4 % VA CREA: 1.0000 | TOPICAL_CREAM | Freq: Every day | VAGINAL | Status: DC

## 2014-10-03 NOTE — MAU Note (Signed)
Having 4-5 contractions per hour for the last few hours. Denies vaginal bleeding or LOF. Positive fetal movement.

## 2014-10-03 NOTE — MAU Provider Note (Signed)
History     CSN: [id number]  Arrival date and time: 10/03/14 2056   First Provider Initiated Contact with Patient 10/03/14 2152      Chief Complaint  Patient presents with  . Contractions  . Back Pain   HPI  Tiffany Hall is a 28 y.o. [id number] at [redacted]w[redacted]d who presents today with contractions. She states that she started having contractions on 12/24, but they eventually stopped. She states that the contractions returned several hours ago, and she feel like she has had about 4 contractions per hour. She denies nay VB or LOF. She confirms fetal movement. She has had some discharge with itching. She denies any urinary sx or complications with any of her previous pregnancies. Her next appointment with the office is on 10/14/14.   Past Medical History  Diagnosis Date  . Migraine headache   . Ovarian cyst   . Sickle cell trait   . Gallstones 2012  . Abnormal Pap smear   . Chlamydia 2008  . Trichomonas 2008  . Gonorrhea 2008  . Anemia     has required blood transfusion  . Urinary tract infection   . Heart murmur     as child  . Abnormal pap 2009    Colpo - unknown results  . Gallstones   . Depression   . H/O varicella   . Dental caries     Past Surgical History  Procedure Laterality Date  . Colposcopy    . Dilation and curettage of uterus      required blood transfusion    Family History  Problem Relation Age of Onset  . Diabetes Mother   . Hypertension Mother   . Hepatitis Mother   . Cancer Mother 28    Breast, cervical  . Diabetes Maternal Grandmother   . Hypertension Maternal Grandmother   . Cancer Maternal Grandmother 40    Breast  . Anesthesia problems Neg Hx   . Other Neg Hx     History  Substance Use Topics  . Smoking status: Former Smoker -- 0.10 packs/day for 10 years    Types: Cigarettes    Quit date: 11/05/2010  . Smokeless tobacco: Never Used     Comment: quit with preg  . Alcohol Use: No     Comment: occ    Allergies:  Allergies  Allergen  Reactions  . Nubain [Nalbuphine Hcl] Swelling and Palpitations    Prescriptions prior to admission  Medication Sig Dispense Refill Last Dose  . acetaminophen (TYLENOL) 500 MG tablet Take 1 tablet (500 mg total) by mouth every 6 (six) hours as needed for moderate pain. 30 tablet 0 Past Week at Unknown time  . calcium carbonate (TUMS - DOSED IN MG ELEMENTAL CALCIUM) 500 MG chewable tablet Chew 2 tablets by mouth 4 (four) times daily as needed for indigestion.    Past Week at Unknown time  . Prenatal Vit-Fe Fumarate-FA (PRENATAL MULTIVITAMIN) TABS tablet Take 1 tablet by mouth daily.    10/03/2014 at Unknown time    ROS Physical Exam   Blood pressure 117/65, pulse 96, temperature 99.1 F (37.3 C), temperature source Oral, resp. rate 16, height 5\' 4"  (1.626 m), weight 80.922 kg (178 lb 6.4 oz), last menstrual period 04/17/2014, SpO2 100 %.  Physical Exam  Nursing note and vitals reviewed. Constitutional: She is oriented to person, place, and time. She appears well-developed and well-nourished. No distress.  Cardiovascular: Normal rate.   Respiratory: Effort normal.  GI: Soft. There is no tenderness.  Genitourinary:   Large amount of thick white discharge at the introitus.  Cervix: closed/thick/firm/high  Neurological: She is alert and oriented to person, place, and time.  Skin: Skin is warm and dry.  Psychiatric: She has a normal mood and affect.   FHT: 155, moderate with 10x10 accels Toco: some UI, but no UCs  MAU Course  Procedures  Results for orders placed or performed during the hospital encounter of 10/03/14 (from the past 24 hour(s))  Urinalysis, Routine w reflex microscopic     Status: Abnormal   Collection Time: 10/03/14  9:10 PM  Result Value Ref Range   Color, Urine YELLOW YELLOW   APPearance CLEAR CLEAR   Specific Gravity, Urine >1.030 (H) 1.005 - 1.030   pH 6.0 5.0 - 8.0   Glucose, UA NEGATIVE NEGATIVE mg/dL   Hgb urine dipstick SMALL (A) NEGATIVE   Bilirubin  Urine NEGATIVE NEGATIVE   Ketones, ur 40 (A) NEGATIVE mg/dL   Protein, ur NEGATIVE NEGATIVE mg/dL   Urobilinogen, UA 1.0 0.0 - 1.0 mg/dL   Nitrite NEGATIVE NEGATIVE   Leukocytes, UA SMALL (A) NEGATIVE  Urine microscopic-add on     Status: Abnormal   Collection Time: 10/03/14  9:10 PM  Result Value Ref Range   Squamous Epithelial / LPF FEW (A) RARE   WBC, UA 3-6 <3 WBC/hpf   RBC / HPF 3-6 <3 RBC/hpf   Bacteria, UA FEW (A) RARE   Urine-Other MUCOUS PRESENT    2200: Patient noted to have high specific gravity and ketones in urine. Will PO hydrate.  2303: D.W Dr. Corinna Capra, ok for dc home. Will treat yeast infection.  2316: Patient sleeping comfortably and states she is no longer feeling contractions.    Assessment and Plan   1. Yeast infection involving the vagina and surrounding area   2. Braxton Hicks contractions    PTL precautions Fetal kick counts Return to MAU as needed    Medication List    TAKE these medications        acetaminophen 500 MG tablet  Commonly known as:  TYLENOL  Take 1 tablet (500 mg total) by mouth every 6 (six) hours as needed for moderate pain.     calcium carbonate 500 MG chewable tablet  Commonly known as:  TUMS - dosed in mg elemental calcium  Chew 2 tablets by mouth 4 (four) times daily as needed for indigestion.     prenatal multivitamin Tabs tablet  Take 1 tablet by mouth daily.     terconazole 0.4 % vaginal cream  Commonly known as:  TERAZOL 7  Place 1 applicator vaginally at bedtime.       Follow-up Information    Follow up with Luz Lex, MD.   Specialty:  Obstetrics and Gynecology   Why:  As scheduled   Contact information:   [address], [place] [address] [phone]        Mathis Bud 10/03/2014, 9:52 PM

## 2014-10-03 NOTE — Discharge Instructions (Signed)
Braxton Hicks Contractions Contractions of the uterus can occur throughout pregnancy. Contractions are not always a sign that you are in labor.  WHAT ARE BRAXTON HICKS CONTRACTIONS?  Contractions that occur before labor are called Braxton Hicks contractions, or false labor. Toward the end of pregnancy (32-34 weeks), these contractions can develop more often and may become more forceful. This is not true labor because these contractions do not result in opening (dilatation) and thinning of the cervix. They are sometimes difficult to tell apart from true labor because these contractions can be forceful and people have different pain tolerances. You should not feel embarrassed if you go to the hospital with false labor. Sometimes, the only way to tell if you are in true labor is for your health care provider to look for changes in the cervix. If there are no prenatal problems or other health problems associated with the pregnancy, it is completely safe to be sent home with false labor and await the onset of true labor. HOW CAN YOU TELL THE DIFFERENCE BETWEEN TRUE AND FALSE LABOR? False Labor  The contractions of false labor are usually shorter and not as hard as those of true labor.   The contractions are usually irregular.   The contractions are often felt in the front of the lower abdomen and in the groin.   The contractions may go away when you walk around or change positions while lying down.   The contractions get weaker and are shorter lasting as time goes on.   The contractions do not usually become progressively stronger, regular, and closer together as with true labor.  True Labor  Contractions in true labor last 30-70 seconds, become very regular, usually become more intense, and increase in frequency.   The contractions do not go away with walking.   The discomfort is usually felt in the top of the uterus and spreads to the lower abdomen and low back.   True labor can be  determined by your health care provider with an exam. This will show that the cervix is dilating and getting thinner.  WHAT TO REMEMBER  Keep up with your usual exercises and follow other instructions given by your health care provider.   Take medicines as directed by your health care provider.   Keep your regular prenatal appointments.   Eat and drink lightly if you think you are going into labor.   If Braxton Hicks contractions are making you uncomfortable:   Change your position from lying down or resting to walking, or from walking to resting.   Sit and rest in a tub of warm water.   Drink 2-3 glasses of water. Dehydration may cause these contractions.   Do slow and deep breathing several times an hour.  WHEN SHOULD I SEEK IMMEDIATE MEDICAL CARE? Seek immediate medical care if:  Your contractions become stronger, more regular, and closer together.   You have fluid leaking or gushing from your vagina.   You have a fever.   You pass blood-tinged mucus.   You have vaginal bleeding.   You have continuous abdominal pain.   You have low back pain that you never had before.   You feel your baby's head pushing down and causing pelvic pressure.   Your baby is not moving as much as it used to.  Document Released: 09/24/2005 Document Revised: 09/29/2013 Document Reviewed: 07/06/2013 [facility] Patient Information 2015 [place], [place]. This information is not intended to replace advice given to you by your health care  provider. Make sure you discuss any questions you have with your health care provider.  Candidal Vulvovaginitis Candidal vulvovaginitis is an infection of the vagina and vulva. The vulva is the skin around the opening of the vagina. This may cause itching and discomfort in and around the vagina.  HOME CARE  Only take medicine as told by your doctor.  Do not have sex (intercourse) until the infection is healed or as told by your  doctor.  Practice safe sex.  Tell your sex partner about your infection.  Do not douche or use tampons.  Wear cotton underwear. Do not wear tight pants or panty hose.  Eat yogurt. This may help treat and prevent yeast infections. GET HELP RIGHT AWAY IF:   You have a fever.  Your problems get worse during treatment or do not get better in 3 days.  You have discomfort, irritation, or itching in your vagina or vulva area.  You have pain after sex.  You start to get belly (abdominal) pain. MAKE SURE YOU:  Understand these instructions.  Will watch your condition.  Will get help right away if you are not doing well or get worse. Document Released: 12/21/2008 Document Revised: 09/29/2013 Document Reviewed: 12/21/2008 [facility] Patient Information 2015 [place], [place]. This information is not intended to replace advice given to you by your health care provider. Make sure you discuss any questions you have with your health care provider.

## 2014-10-08 NOTE — L&D Delivery Note (Signed)
SVD of VMI at 2017 on 01/10/15.  EBL 300cc.  APGARs 9,9.  Placenta to L&D. Head delivered LOA and body followed atraumatically.  Cord was clamped, cut and baby to abdomen.  Cord blood was obtained.  Placenta delivered S/I/3VC.  Fundus firmed with pitocin and massage.  Perineum intact.  Mom and baby stable.    Linda Hedges, DO

## 2014-11-12 ENCOUNTER — Inpatient Hospital Stay (HOSPITAL_COMMUNITY)
Admission: AD | Admit: 2014-11-12 | Discharge: 2014-11-12 | Disposition: A | Discharge status: Home / Self Care | Payer: Medicare Other | Source: Ambulatory Visit | Attending: Obstetrics & Gynecology | Admitting: Obstetrics & Gynecology

## 2014-11-12 ENCOUNTER — Encounter (HOSPITAL_COMMUNITY): Payer: Self-pay | Admitting: *Deleted

## 2014-11-12 DIAGNOSIS — N949 Unspecified condition associated with female genital organs and menstrual cycle: Secondary | ICD-10-CM

## 2014-11-12 DIAGNOSIS — Z3A30 30 weeks gestation of pregnancy: Secondary | ICD-10-CM | POA: Insufficient documentation

## 2014-11-12 DIAGNOSIS — Z87891 Personal history of nicotine dependence: Secondary | ICD-10-CM | POA: Diagnosis not present

## 2014-11-12 DIAGNOSIS — O9989 Other specified diseases and conditions complicating pregnancy, childbirth and the puerperium: Secondary | ICD-10-CM | POA: Diagnosis not present

## 2014-11-12 DIAGNOSIS — M25552 Pain in left hip: Secondary | ICD-10-CM | POA: Diagnosis present

## 2014-11-12 LAB — URINALYSIS, ROUTINE W REFLEX MICROSCOPIC
BILIRUBIN URINE: NEGATIVE
Glucose, UA: NEGATIVE mg/dL
Ketones, ur: NEGATIVE mg/dL
Nitrite: NEGATIVE
Protein, ur: NEGATIVE mg/dL
Specific Gravity, Urine: 1.015 (ref 1.005–1.030)
Urobilinogen, UA: 0.2 mg/dL (ref 0.0–1.0)
pH: 7 (ref 5.0–8.0)

## 2014-11-12 LAB — URINE MICROSCOPIC-ADD ON

## 2014-11-12 NOTE — MAU Note (Signed)
C/o pain that started in lower back on L side and then pain in L hip and L leg; pain increases with movement; while resting pain is 3 and when walking pt is 8; Pain started last night around 2100;

## 2014-11-12 NOTE — MAU Note (Signed)
Pt advised to come to MAU - back pain started 2 days ago, last night began having L hip pain goes down her L leg.  Worse with standing, walking, changing positions.  Pt states she always has braxton hicks uc's, denies bleeding or LOF.

## 2014-11-12 NOTE — MAU Provider Note (Signed)
History     CSN: [id number]  Arrival date and time: 11/12/14 1635   First Provider Initiated Contact with Patient 11/12/14 1732      Chief Complaint  Patient presents with  . Hip Pain   HPI Comments: Tiffany Hall 29 y.o. [id number] [redacted]w[redacted]d presents to MAU with pains that are in her left pelvis and can go to her back. She points to her Round Ligament area. This has been ongoing off and on for 2 weeks. Its worse when she stands, walks around and changes position. She denies any vaginal bleeding, LOF or contractions.   Hip Pain       Past Medical History  Diagnosis Date  . Migraine headache   . Ovarian cyst   . Sickle cell trait   . Gallstones 2012  . Abnormal Pap smear   . Chlamydia 2008  . Trichomonas 2008  . Gonorrhea 2008  . Anemia     has required blood transfusion  . Urinary tract infection   . Heart murmur     as child  . Abnormal pap 2009    Colpo - unknown results  . Gallstones   . Depression   . H/O varicella   . Dental caries     Past Surgical History  Procedure Laterality Date  . Colposcopy    . Dilation and curettage of uterus      required blood transfusion    Family History  Problem Relation Age of Onset  . Diabetes Mother   . Hypertension Mother   . Hepatitis Mother   . Cancer Mother 64    Breast, cervical  . Diabetes Maternal Grandmother   . Hypertension Maternal Grandmother   . Cancer Maternal Grandmother 40    Breast  . Anesthesia problems Neg Hx   . Other Neg Hx     History  Substance Use Topics  . Smoking status: Former Smoker -- 0.10 packs/day for 10 years    Types: Cigarettes    Quit date: 11/05/2010  . Smokeless tobacco: Never Used     Comment: quit with preg  . Alcohol Use: No     Comment: occ    Allergies:  Allergies  Allergen Reactions  . Nubain [Nalbuphine Hcl] Swelling and Palpitations    Prescriptions prior to admission  Medication Sig Dispense Refill Last Dose  . acetaminophen (TYLENOL) 500 MG tablet Take 1  tablet (500 mg total) by mouth every 6 (six) hours as needed for moderate pain. 30 tablet 0 Past Week at Unknown time  . calcium carbonate (TUMS - DOSED IN MG ELEMENTAL CALCIUM) 500 MG chewable tablet Chew 2 tablets by mouth 4 (four) times daily as needed for indigestion.    Past Month at Unknown time  . docusate sodium (COLACE) 100 MG capsule Take 100 mg by mouth daily as needed for mild constipation.   Past Week at Unknown time  . Iron TABS Take 1 tablet by mouth daily.   11/11/2014 at Unknown time  . Prenatal Vit-Fe Fumarate-FA (PRENATAL MULTIVITAMIN) TABS tablet Take 1 tablet by mouth daily.    11/12/2014 at Unknown time  . terconazole (TERAZOL 7) 0.4 % vaginal cream Place 1 applicator vaginally at bedtime. (Patient not taking: Reported on 11/12/2014) 45 g 0     Review of Systems  Constitutional: Negative.   HENT: Negative.   Eyes: Negative.   Respiratory: Negative.   Cardiovascular: Negative.   Gastrointestinal: Positive for abdominal pain.       Pains in low pelvis  and hip regions  Genitourinary: Negative.   Musculoskeletal: Positive for back pain.  Skin: Negative.   Neurological: Negative.   Psychiatric/Behavioral: Negative.    Physical Exam   Blood pressure 108/70, pulse 89, temperature 98.3 F (36.8 C), temperature source Oral, resp. rate 18, last menstrual period 04/17/2014.  Physical Exam  Constitutional: She is oriented to person, place, and time. She appears well-developed and well-nourished. No distress.  HENT:  Head: Normocephalic and atraumatic.  Eyes: Pupils are equal, round, and reactive to light.  Cardiovascular: Normal rate, regular rhythm and normal heart sounds.   Respiratory: Effort normal and breath sounds normal. No respiratory distress. She has no wheezes. She has no rales.  GI: Soft. There is tenderness.  Tenderness left round ligament area  Musculoskeletal: Normal range of motion.  Neurological: She is alert and oriented to person, place, and time.  Skin:  Skin is warm and dry.  Psychiatric: She has a normal mood and affect. Her behavior is normal. Judgment and thought content normal.   Results for orders placed or performed during the hospital encounter of 11/12/14 (from the past 24 hour(s))  Urinalysis, Routine w reflex microscopic     Status: Abnormal   Collection Time: 11/12/14  4:50 PM  Result Value Ref Range   Color, Urine YELLOW YELLOW   APPearance CLEAR CLEAR   Specific Gravity, Urine 1.015 1.005 - 1.030   pH 7.0 5.0 - 8.0   Glucose, UA NEGATIVE NEGATIVE mg/dL   Hgb urine dipstick TRACE (A) NEGATIVE   Bilirubin Urine NEGATIVE NEGATIVE   Ketones, ur NEGATIVE NEGATIVE mg/dL   Protein, ur NEGATIVE NEGATIVE mg/dL   Urobilinogen, UA 0.2 0.0 - 1.0 mg/dL   Nitrite NEGATIVE NEGATIVE   Leukocytes, UA TRACE (A) NEGATIVE  Urine microscopic-add on     Status: Abnormal   Collection Time: 11/12/14  4:50 PM  Result Value Ref Range   Squamous Epithelial / LPF FEW (A) RARE   WBC, UA 3-6 <3 WBC/hpf   RBC / HPF 0-2 <3 RBC/hpf   Bacteria, UA RARE RARE    MAU Course  Procedures  MDM  TOCO: rate 150 and reactive. No contractions Called Dr Lynnette Caffey who agreed to let her go home Assessment and Plan   A: Round ligament pains  P: Advised maternity belt/ warm baths/ tylenol Follow up with Dr Morris/ MAU as needed    [name] 11/12/2014, 5:47 PM

## 2014-11-12 NOTE — Discharge Instructions (Signed)
Third Trimester of Pregnancy The third trimester is from week 29 through week 42, months 7 through 9. This trimester is when your unborn baby (fetus) is growing very fast. At the end of the ninth month, the unborn baby is about 20 inches in length. It weighs about 6-10 pounds.  HOME CARE   Avoid all smoking, herbs, and alcohol. Avoid drugs not approved by your doctor.  Only take medicine as told by your doctor. Some medicines are safe and some are not during pregnancy.  Exercise only as told by your doctor. Stop exercising if you start having cramps.  Eat regular, healthy meals.  Wear a good support bra if your breasts are tender.  Do not use hot tubs, steam rooms, or saunas.  Wear your seat belt when driving.  Avoid raw meat, uncooked cheese, and liter boxes and soil used by cats.  Take your prenatal vitamins.  Try taking medicine that helps you poop (stool softener) as needed, and if your doctor approves. Eat more fiber by eating fresh fruit, vegetables, and whole grains. Drink enough fluids to keep your pee (urine) clear or pale yellow.  Take warm water baths (sitz baths) to soothe pain or discomfort caused by hemorrhoids. Use hemorrhoid cream if your doctor approves.  If you have puffy, bulging veins (varicose veins), wear support hose. Raise (elevate) your feet for 15 minutes, 3-4 times a day. Limit salt in your diet.  Avoid heavy lifting, wear low heels, and sit up straight.  Rest with your legs raised if you have leg cramps or low back pain.  Visit your dentist if you have not gone during your pregnancy. Use a soft toothbrush to brush your teeth. Be gentle when you floss.  You can have sex (intercourse) unless your doctor tells you not to.  Do not travel far distances unless you must. Only do so with your doctor's approval.  Take prenatal classes.  Practice driving to the hospital.  Pack your hospital bag.  Prepare the baby's room.  Go to your doctor visits. GET  HELP IF:  You are not sure if you are in labor or if your water has broken.  You are dizzy.  You have mild cramps or pressure in your lower belly (abdominal).  You have a nagging pain in your belly area.  You continue to feel sick to your stomach (nauseous), throw up (vomit), or have watery poop (diarrhea).  You have bad smelling fluid coming from your vagina.  You have pain with peeing (urination). GET HELP RIGHT AWAY IF:   You have a fever.  You are leaking fluid from your vagina.  You are spotting or bleeding from your vagina.  You have severe belly cramping or pain.  You lose or gain weight rapidly.  You have trouble catching your breath and have chest pain.  You notice sudden or extreme puffiness (swelling) of your face, hands, ankles, feet, or legs.  You have not felt the baby move in over an hour.  You have severe headaches that do not go away with medicine.  You have vision changes. Document Released: 12/19/2009 Document Revised: 01/19/2013 Document Reviewed: 11/25/2012 [facility] Patient Information 2015 [place], [place]. This information is not intended to replace advice given to you by your health care provider. Make sure you discuss any questions you have with your health care provider. Abdominal Pain During Pregnancy Belly (abdominal) pain is common during pregnancy. Most of the time, it is not a serious problem. Other times, it can  be a sign that something is wrong with the pregnancy. Always tell your doctor if you have belly pain. HOME CARE Monitor your belly pain for any changes. The following actions may help you feel better:  Do not have sex (intercourse) or put anything in your vagina until you feel better.  Rest until your pain stops.  Drink clear fluids if you feel sick to your stomach (nauseous). Do not eat solid food until you feel better.  Only take medicine as told by your doctor.  Keep all doctor visits as told. GET HELP RIGHT AWAY IF:    You are bleeding, leaking fluid, or pieces of tissue come out of your vagina.  You have more pain or cramping.  You keep throwing up (vomiting).  You have pain when you pee (urinate) or have blood in your pee.  You have a fever.  You do not feel your baby moving as much.  You feel very weak or feel like passing out.  You have trouble breathing, with or without belly pain.  You have a very bad headache and belly pain.  You have fluid leaking from your vagina and belly pain.  You keep having watery poop (diarrhea).  Your belly pain does not go away after resting, or the pain gets worse. MAKE SURE YOU:   Understand these instructions.  Will watch your condition.  Will get help right away if you are not doing well or get worse. Document Released: 09/12/2009 Document Revised: 05/27/2013 Document Reviewed: 04/23/2013 [facility] Patient Information 2015 [place], [place]. This information is not intended to replace advice given to you by your health care provider. Make sure you discuss any questions you have with your health care provider.

## 2014-11-24 ENCOUNTER — Encounter (HOSPITAL_COMMUNITY): Payer: Self-pay | Admitting: *Deleted

## 2014-11-24 ENCOUNTER — Inpatient Hospital Stay (HOSPITAL_COMMUNITY)
Admission: AD | Admit: 2014-11-24 | Discharge: 2014-11-24 | Disposition: A | Discharge status: Home / Self Care | Payer: Medicare Other | Source: Ambulatory Visit | Attending: Obstetrics & Gynecology | Admitting: Obstetrics & Gynecology

## 2014-11-24 DIAGNOSIS — O479 False labor, unspecified: Secondary | ICD-10-CM

## 2014-11-24 DIAGNOSIS — Z3A37 37 weeks gestation of pregnancy: Secondary | ICD-10-CM

## 2014-11-24 DIAGNOSIS — O4703 False labor before 37 completed weeks of gestation, third trimester: Secondary | ICD-10-CM | POA: Diagnosis not present

## 2014-11-24 DIAGNOSIS — Z3A32 32 weeks gestation of pregnancy: Secondary | ICD-10-CM | POA: Diagnosis not present

## 2014-11-24 DIAGNOSIS — Z87891 Personal history of nicotine dependence: Secondary | ICD-10-CM | POA: Diagnosis not present

## 2014-11-24 LAB — URINALYSIS, ROUTINE W REFLEX MICROSCOPIC
BILIRUBIN URINE: NEGATIVE
GLUCOSE, UA: NEGATIVE mg/dL
Ketones, ur: 15 mg/dL — AB
Nitrite: NEGATIVE
PH: 6.5 (ref 5.0–8.0)
Protein, ur: NEGATIVE mg/dL
Specific Gravity, Urine: >1.03 — ABNORMAL HIGH (ref 1.005–1.030)
Urobilinogen, UA: 1 mg/dL (ref 0.0–1.0)

## 2014-11-24 LAB — WET PREP, GENITAL
Clue Cells Wet Prep HPF POC: NONE SEEN
Trich, Wet Prep: NONE SEEN
Yeast Wet Prep HPF POC: NONE SEEN

## 2014-11-24 LAB — URINE MICROSCOPIC-ADD ON

## 2014-11-24 MED ORDER — ACETAMINOPHEN 500 MG PO TABS
1000.0000 mg | ORAL_TABLET | Freq: Once | ORAL | Status: AC
  Administered 2014-11-24: 1000 mg via ORAL
  Filled 2014-11-24: qty 2

## 2014-11-24 MED ORDER — LACTATED RINGERS IV BOLUS (SEPSIS)
1000.0000 mL | Freq: Once | INTRAVENOUS | Status: AC
  Administered 2014-11-24: 1000 mL via INTRAVENOUS

## 2014-11-24 NOTE — MAU Provider Note (Signed)
History     CSN: [id number]  Arrival date and time: 11/24/14 1600   First Provider Initiated Contact with Patient 11/24/14 1704      Chief Complaint  Patient presents with  . Contractions   HPI    [name] is a 29 y.o. female [phone] at [redacted]w[redacted]d presenting with contractions. She was having contractions off and on since last night. She was instructed to come in last night however felt they were not strong enough.  She has felt two contractions since her arrival here. When the contraction comes she rates her pain 8/10.  She denies bleeding She also complains of clear fluid that changed to milky white discharge. She does not feel that her water is broke.   + fetal movement She drinks 8 bottles of water per day.   OB History    Gravida Para Term Preterm AB TAB SAB Ectopic Multiple Living   6 4 4  0 1 0 1 0 0 4      Past Medical History  Diagnosis Date  . Migraine headache   . Ovarian cyst   . Sickle cell trait   . Gallstones 2012  . Abnormal Pap smear   . Chlamydia 2008  . Trichomonas 2008  . Gonorrhea 2008  . Anemia     has required blood transfusion  . Urinary tract infection   . Heart murmur     as child  . Abnormal pap 2009    Colpo - unknown results  . Gallstones   . Depression   . H/O varicella   . Dental caries     Past Surgical History  Procedure Laterality Date  . Colposcopy    . Dilation and curettage of uterus      required blood transfusion    Family History  Problem Relation Age of Onset  . Diabetes Mother   . Hypertension Mother   . Hepatitis Mother   . Cancer Mother 49    Breast, cervical  . Diabetes Maternal Grandmother   . Hypertension Maternal Grandmother   . Cancer Maternal Grandmother 40    Breast  . Anesthesia problems Neg Hx   . Other Neg Hx     History  Substance Use Topics  . Smoking status: Former Smoker -- 0.10 packs/day for 10 years    Types: Cigarettes    Quit date: 11/05/2010  . Smokeless tobacco: Never  Used     Comment: quit with preg  . Alcohol Use: No     Comment: occ    Allergies:  Allergies  Allergen Reactions  . Nubain [Nalbuphine Hcl] Swelling and Palpitations    Prescriptions prior to admission  Medication Sig Dispense Refill Last Dose  . acetaminophen (TYLENOL) 500 MG tablet Take 1 tablet (500 mg total) by mouth every 6 (six) hours as needed for moderate pain. 30 tablet 0 Past Week at Unknown time  . calcium carbonate (TUMS - DOSED IN MG ELEMENTAL CALCIUM) 500 MG chewable tablet Chew 2 tablets by mouth 4 (four) times daily as needed for indigestion.    Past Week at Unknown time  . docusate sodium (COLACE) 100 MG capsule Take 100 mg by mouth daily as needed for mild constipation.   Past Week at Unknown time  . Iron TABS Take 1 tablet by mouth daily.   11/23/2014 at Unknown time  . Prenatal Vit-Fe Fumarate-FA (PRENATAL MULTIVITAMIN) TABS tablet Take 1 tablet by mouth daily.    11/24/2014 at Unknown time  . terconazole (TERAZOL  7) 0.4 % vaginal cream Place 1 applicator vaginally at bedtime. (Patient not taking: Reported on 11/12/2014) 45 g 0    Results for orders placed or performed during the hospital encounter of 11/24/14 (from the past 48 hour(s))  Urinalysis, Routine w reflex microscopic     Status: Abnormal   Collection Time: 11/24/14  4:10 PM  Result Value Ref Range   Color, Urine YELLOW YELLOW   APPearance HAZY (A) CLEAR   Specific Gravity, Urine >1.030 (H) 1.005 - 1.030   pH 6.5 5.0 - 8.0   Glucose, UA NEGATIVE NEGATIVE mg/dL   Hgb urine dipstick TRACE (A) NEGATIVE   Bilirubin Urine NEGATIVE NEGATIVE   Ketones, ur 15 (A) NEGATIVE mg/dL   Protein, ur NEGATIVE NEGATIVE mg/dL   Urobilinogen, UA 1.0 0.0 - 1.0 mg/dL   Nitrite NEGATIVE NEGATIVE   Leukocytes, UA TRACE (A) NEGATIVE  Urine microscopic-add on     Status: Abnormal   Collection Time: 11/24/14  4:10 PM  Result Value Ref Range   Squamous Epithelial / LPF MANY (A) RARE   WBC, UA 0-2 <3 WBC/hpf   RBC / HPF 0-2 <3  RBC/hpf   Urine-Other MUCOUS PRESENT   Wet prep, genital     Status: Abnormal   Collection Time: 11/24/14  6:22 PM  Result Value Ref Range   Yeast Wet Prep HPF POC NONE SEEN NONE SEEN   Trich, Wet Prep NONE SEEN NONE SEEN   Clue Cells Wet Prep HPF POC NONE SEEN NONE SEEN   WBC, Wet Prep HPF POC FEW (A) NONE SEEN    Comment: MODERATE BACTERIA SEEN    Review of Systems  Constitutional: Negative for fever and chills.  Gastrointestinal: Positive for abdominal pain (Upper and lower abdominal pain ). Negative for nausea, vomiting, diarrhea and constipation.  Genitourinary: Negative for dysuria, urgency, frequency and hematuria.   Physical Exam   Blood pressure 109/67, pulse 104, temperature 98 F (36.7 C), temperature source Oral, resp. rate 18, height 5\' 4"  (1.626 m), weight 82.464 kg (181 lb 12.8 oz), last menstrual period 04/17/2014.  Physical Exam  Constitutional: She is oriented to person, place, and time. She appears well-developed and well-nourished.  HENT:  Head: Normocephalic.  Eyes: Pupils are equal, round, and reactive to light.  Neck: Neck supple.  Cardiovascular: Normal rate and normal heart sounds.   Respiratory: Effort normal.  GI: Soft. She exhibits no distension. There is no tenderness. There is no rebound.  Genitourinary:  Speculum exam: Vagina - Small amount of creamy discharge, no odor Cervix - No contact bleeding, no active bleeding  Bimanual exam: Cervix closed, thick, middle  Adnexa non tender, no masses bilaterally wet prep done Chaperone present for exam.   Musculoskeletal: Normal range of motion.  Neurological: She is alert and oriented to person, place, and time.  Skin: Skin is warm.  Psychiatric: Her behavior is normal.   Fetal Tracing: Baseline: 135 bpm  Variability: moderate  Accelerations: 15x15 Decelerations: none Toco: none  MAU Course  Procedures None   MDM One liter of fluid bolus: LR Tylenol 1 gram Discussed patient with Dr.  Melodie Bouillon prep UA  NST  Following fluid and tylenol patient feels better and is ready for discharge home.   Assessment and Plan   A:  1. Braxton Hick's contraction; likely related to dehydration     P:  Discharge home in stable condition Follow up with Dr. Lynnette Caffey as scheduled Kick counts Increase water intake 8-10 glasses/bottles of water  daily is recommended Preterm labor precautions Return to MAU if symptoms worsen    Darrelyn Hillock Shelley Pooley, NP 11/24/2014 7:52 PM

## 2014-11-24 NOTE — Progress Notes (Signed)
Patient states baby has been transverse.

## 2014-11-24 NOTE — MAU Note (Signed)
Pt presents complaining of contractions every 7 minutes and a clear leakage and a white discharge. Reports good fetal movement. Denies vaginal bleeding.

## 2014-11-24 NOTE — Discharge Instructions (Signed)
Braxton Hicks Contractions °Contractions of the uterus can occur throughout pregnancy. Contractions are not always a sign that you are in labor.  °WHAT ARE BRAXTON HICKS CONTRACTIONS?  °Contractions that occur before labor are called Braxton Hicks contractions, or false labor. Toward the end of pregnancy (32-34 weeks), these contractions can develop more often and may become more forceful. This is not true labor because these contractions do not result in opening (dilatation) and thinning of the cervix. They are sometimes difficult to tell apart from true labor because these contractions can be forceful and people have different pain tolerances. You should not feel embarrassed if you go to the hospital with false labor. Sometimes, the only way to tell if you are in true labor is for your health care provider to look for changes in the cervix. °If there are no prenatal problems or other health problems associated with the pregnancy, it is completely safe to be sent home with false labor and await the onset of true labor. °HOW CAN YOU TELL THE DIFFERENCE BETWEEN TRUE AND FALSE LABOR? °False Labor °· The contractions of false labor are usually shorter and not as hard as those of true labor.   °· The contractions are usually irregular.   °· The contractions are often felt in the front of the lower abdomen and in the groin.   °· The contractions may go away when you walk around or change positions while lying down.   °· The contractions get weaker and are shorter lasting as time goes on.   °· The contractions do not usually become progressively stronger, regular, and closer together as with true labor.   °True Labor °· Contractions in true labor last 30-70 seconds, become very regular, usually become more intense, and increase in frequency.   °· The contractions do not go away with walking.   °· The discomfort is usually felt in the top of the uterus and spreads to the lower abdomen and low back.   °· True labor can be  determined by your health care provider with an exam. This will show that the cervix is dilating and getting thinner.   °WHAT TO REMEMBER °· Keep up with your usual exercises and follow other instructions given by your health care provider.   °· Take medicines as directed by your health care provider.   °· Keep your regular prenatal appointments.   °· Eat and drink lightly if you think you are going into labor.   °· If Braxton Hicks contractions are making you uncomfortable:   °¨ Change your position from lying down or resting to walking, or from walking to resting.   °¨ Sit and rest in a tub of warm water.   °¨ Drink 2-3 glasses of water. Dehydration may cause these contractions.   °¨ Do slow and deep breathing several times an hour.   °WHEN SHOULD I SEEK IMMEDIATE MEDICAL CARE? °Seek immediate medical care if: °· Your contractions become stronger, more regular, and closer together.   °· You have fluid leaking or gushing from your vagina.   °· You have a fever.   °· You pass blood-tinged mucus.   °· You have vaginal bleeding.   °· You have continuous abdominal pain.   °· You have low back pain that you never had before.   °· You feel your baby's head pushing down and causing pelvic pressure.   °· Your baby is not moving as much as it used to.   °Document Released: 09/24/2005 Document Revised: 09/29/2013 Document Reviewed: 07/06/2013 °[employer]® Patient Information ©2015 [employer]. This information is not intended to replace advice given to you by your health care   provider. Make sure you discuss any questions you have with your health care provider. ° °

## 2014-12-10 ENCOUNTER — Inpatient Hospital Stay (HOSPITAL_COMMUNITY)
Admission: AD | Admit: 2014-12-10 | Discharge: 2014-12-10 | Disposition: A | Discharge status: Home / Self Care | Payer: Medicare Other | Source: Ambulatory Visit | Attending: Obstetrics and Gynecology | Admitting: Obstetrics and Gynecology

## 2014-12-10 ENCOUNTER — Encounter (HOSPITAL_COMMUNITY): Payer: Self-pay | Admitting: General Practice

## 2014-12-10 DIAGNOSIS — Z3A34 34 weeks gestation of pregnancy: Secondary | ICD-10-CM | POA: Diagnosis not present

## 2014-12-10 DIAGNOSIS — O4703 False labor before 37 completed weeks of gestation, third trimester: Secondary | ICD-10-CM | POA: Diagnosis not present

## 2014-12-10 DIAGNOSIS — Z3A35 35 weeks gestation of pregnancy: Secondary | ICD-10-CM | POA: Diagnosis not present

## 2014-12-10 DIAGNOSIS — R109 Unspecified abdominal pain: Secondary | ICD-10-CM | POA: Diagnosis present

## 2014-12-10 DIAGNOSIS — Z87891 Personal history of nicotine dependence: Secondary | ICD-10-CM | POA: Insufficient documentation

## 2014-12-10 LAB — URINALYSIS, ROUTINE W REFLEX MICROSCOPIC
BILIRUBIN URINE: NEGATIVE
Glucose, UA: NEGATIVE mg/dL
KETONES UR: NEGATIVE mg/dL
NITRITE: NEGATIVE
Protein, ur: NEGATIVE mg/dL
Specific Gravity, Urine: <1.005 — ABNORMAL LOW (ref 1.005–1.030)
UROBILINOGEN UA: 0.2 mg/dL (ref 0.0–1.0)
pH: 6 (ref 5.0–8.0)

## 2014-12-10 LAB — URINE MICROSCOPIC-ADD ON

## 2014-12-10 MED ORDER — LACTATED RINGERS IV BOLUS (SEPSIS): 1000.0000 mL | Freq: Once | INTRAVENOUS | Status: DC

## 2014-12-10 NOTE — MAU Provider Note (Addendum)
History     CSN: [id number]  Arrival date and time: 12/10/14 1519   First Provider Initiated Contact with Patient 12/10/14 1612      Chief Complaint  Patient presents with  . Abdominal Pain   HPI  [name] is a 29 y.o. [id number] at [redacted]w[redacted]d who presents to MAU today with complaint of contractions. The patient states a history of PTL with a previous pregnancy, but did not deliver until 38 weeks. She states that contractions started last night. Initially they were ~ 10 minutes apart, but more frequent now. She denies vaginal bleeding, abnormal discharge or LOF. She denies complications with this pregnancy. She reports good fetal movement.   OB History    Gravida Para Term Preterm AB TAB SAB Ectopic Multiple Living   6 4 4  0 1 0 1 0 0 4      Past Medical History  Diagnosis Date  . Migraine headache   . Ovarian cyst   . Sickle cell trait   . Gallstones 2012  . Abnormal Pap smear   . Chlamydia 2008  . Trichomonas 2008  . Gonorrhea 2008  . Anemia     has required blood transfusion  . Urinary tract infection   . Heart murmur     as child  . Abnormal pap 2009    Colpo - unknown results  . Gallstones   . Depression   . H/O varicella   . Dental caries     Past Surgical History  Procedure Laterality Date  . Colposcopy    . Dilation and curettage of uterus      required blood transfusion    Family History  Problem Relation Age of Onset  . Diabetes Mother   . Hypertension Mother   . Hepatitis Mother   . Cancer Mother 48    Breast, cervical  . Diabetes Maternal Grandmother   . Hypertension Maternal Grandmother   . Cancer Maternal Grandmother 40    Breast  . Anesthesia problems Neg Hx   . Other Neg Hx     History  Substance Use Topics  . Smoking status: Former Smoker -- 0.10 packs/day for 10 years    Types: Cigarettes    Quit date: 11/05/2010  . Smokeless tobacco: Never Used     Comment: quit with preg  . Alcohol Use: No     Comment: occ     Allergies:  Allergies  Allergen Reactions  . Nubain [Nalbuphine Hcl] Swelling and Palpitations    Prescriptions prior to admission  Medication Sig Dispense Refill Last Dose  . acetaminophen (TYLENOL) 500 MG tablet Take 1 tablet (500 mg total) by mouth every 6 (six) hours as needed for moderate pain. 30 tablet 0 Past Week at Unknown time  . calcium carbonate (TUMS - DOSED IN MG ELEMENTAL CALCIUM) 500 MG chewable tablet Chew 2 tablets by mouth 4 (four) times daily as needed for indigestion.    Past Month at Unknown time  . docusate sodium (COLACE) 100 MG capsule Take 100 mg by mouth daily as needed for mild constipation.   Past Month at Unknown time  . Iron TABS Take 1 tablet by mouth daily.   Past Week at Unknown time  . Prenatal Vit-Fe Fumarate-FA (PRENATAL MULTIVITAMIN) TABS tablet Take 1 tablet by mouth daily.    12/10/2014 at Unknown time    Review of Systems  Constitutional: Negative for fever and malaise/fatigue.  Gastrointestinal: Positive for abdominal pain. Negative for nausea, vomiting, diarrhea and constipation.  Genitourinary: Negative for dysuria, urgency and frequency.       Neg - vaginal bleeding, abnormal discharge, LOF   Physical Exam   Blood pressure 112/63, pulse 80, temperature 98.6 F (37 C), temperature source Oral, resp. rate 18, last menstrual period 04/17/2014.  Physical Exam  Constitutional: She is oriented to person, place, and time. She appears well-developed and well-nourished. No distress.  HENT:  Head: Normocephalic.  Cardiovascular: Normal rate.   Respiratory: Effort normal.  GI: Soft. She exhibits no distension and no mass. There is no tenderness. There is no rebound and no guarding.  Neurological: She is alert and oriented to person, place, and time.  Skin: Skin is warm and dry. No erythema.  Psychiatric: She has a normal mood and affect.  Dilation: Fingertip Effacement (%): Thick Cervical Position: Posterior Station: Ballotable Exam by::  Kerry Hough, pa  Results for orders placed or performed during the hospital encounter of 12/10/14 (from the past 24 hour(s))  Urinalysis, Routine w reflex microscopic     Status: Abnormal   Collection Time: 12/10/14  3:40 PM  Result Value Ref Range   Color, Urine YELLOW YELLOW   APPearance HAZY (A) CLEAR   Specific Gravity, Urine <1.005 (L) 1.005 - 1.030   pH 6.0 5.0 - 8.0   Glucose, UA NEGATIVE NEGATIVE mg/dL   Hgb urine dipstick TRACE (A) NEGATIVE   Bilirubin Urine NEGATIVE NEGATIVE   Ketones, ur NEGATIVE NEGATIVE mg/dL   Protein, ur NEGATIVE NEGATIVE mg/dL   Urobilinogen, UA 0.2 0.0 - 1.0 mg/dL   Nitrite NEGATIVE NEGATIVE   Leukocytes, UA LARGE (A) NEGATIVE  Urine microscopic-add on     Status: Abnormal   Collection Time: 12/10/14  3:40 PM  Result Value Ref Range   Squamous Epithelial / LPF MANY (A) RARE   WBC, UA 7-10 <3 WBC/hpf   RBC / HPF 3-6 <3 RBC/hpf   Bacteria, UA MANY (A) RARE     Fetal Monitoring: Baseline: 125 bpm, moderate variability, + accelerations, no decelerations Contractions: q 3- 7 minutes  MAU Course  Procedures None  MDM UA today Discussed patient with Dr. Corinna Capra. Recommends IV fluids and then either Procardia or Terbutaline if needed for contractions.  1705 - Patient receiving IV fluids. Care turned over to [facility], CNM  Luvenia Redden, PA-C  12/10/2014, 5:04 PM   Contractions decreased w/ IV hydration, no cervical change on repeat exam, continued reactive tracing. Large leuk on UA, many bacterial and epithelial cells, likely contaminated sample, no UTI symptoms, sent culture.  Assessment and Plan   1. Preterm uterine contractions, antepartum, third trimester   No evidence of labor, precautions rev'd, follow up as scheduled or sooner PRN    Medication List    TAKE these medications        acetaminophen 500 MG tablet  Commonly known as:  TYLENOL  Take 1 tablet (500 mg total) by mouth every 6 (six) hours as needed for moderate pain.      calcium carbonate 500 MG chewable tablet  Commonly known as:  TUMS - dosed in mg elemental calcium  Chew 2 tablets by mouth 4 (four) times daily as needed for indigestion.     docusate sodium 100 MG capsule  Commonly known as:  COLACE  Take 100 mg by mouth daily as needed for mild constipation.     Iron Tabs  Take 1 tablet by mouth daily.     prenatal multivitamin Tabs tablet  Take 1 tablet by mouth daily.  Follow-up Information    Follow up with LOWE,DAVID C, MD.   Specialty:  Obstetrics and Gynecology   Why:  as scheduled or sooner as needed   Contact information:   [address] [place] [address] [place] [address] [phone]

## 2014-12-10 NOTE — Discharge Instructions (Signed)
Braxton Hicks Contractions °Contractions of the uterus can occur throughout pregnancy. Contractions are not always a sign that you are in labor.  °WHAT ARE BRAXTON HICKS CONTRACTIONS?  °Contractions that occur before labor are called Braxton Hicks contractions, or false labor. Toward the end of pregnancy (32-34 weeks), these contractions can develop more often and may become more forceful. This is not true labor because these contractions do not result in opening (dilatation) and thinning of the cervix. They are sometimes difficult to tell apart from true labor because these contractions can be forceful and people have different pain tolerances. You should not feel embarrassed if you go to the hospital with false labor. Sometimes, the only way to tell if you are in true labor is for your health care provider to look for changes in the cervix. °If there are no prenatal problems or other health problems associated with the pregnancy, it is completely safe to be sent home with false labor and await the onset of true labor. °HOW CAN YOU TELL THE DIFFERENCE BETWEEN TRUE AND FALSE LABOR? °False Labor °· The contractions of false labor are usually shorter and not as hard as those of true labor.   °· The contractions are usually irregular.   °· The contractions are often felt in the front of the lower abdomen and in the groin.   °· The contractions may go away when you walk around or change positions while lying down.   °· The contractions get weaker and are shorter lasting as time goes on.   °· The contractions do not usually become progressively stronger, regular, and closer together as with true labor.   °True Labor °· Contractions in true labor last 30-70 seconds, become very regular, usually become more intense, and increase in frequency.   °· The contractions do not go away with walking.   °· The discomfort is usually felt in the top of the uterus and spreads to the lower abdomen and low back.   °· True labor can be  determined by your health care provider with an exam. This will show that the cervix is dilating and getting thinner.   °WHAT TO REMEMBER °· Keep up with your usual exercises and follow other instructions given by your health care provider.   °· Take medicines as directed by your health care provider.   °· Keep your regular prenatal appointments.   °· Eat and drink lightly if you think you are going into labor.   °· If Braxton Hicks contractions are making you uncomfortable:   °¨ Change your position from lying down or resting to walking, or from walking to resting.   °¨ Sit and rest in a tub of warm water.   °¨ Drink 2-3 glasses of water. Dehydration may cause these contractions.   °¨ Do slow and deep breathing several times an hour.   °WHEN SHOULD I SEEK IMMEDIATE MEDICAL CARE? °Seek immediate medical care if: °· Your contractions become stronger, more regular, and closer together.   °· You have fluid leaking or gushing from your vagina.   °· You have a fever.   °· You pass blood-tinged mucus.   °· You have vaginal bleeding.   °· You have continuous abdominal pain.   °· You have low back pain that you never had before.   °· You feel your baby's head pushing down and causing pelvic pressure.   °· Your baby is not moving as much as it used to.   °Document Released: 09/24/2005 Document Revised: 09/29/2013 Document Reviewed: 07/06/2013 °[employer]® Patient Information ©2015 [employer]. This information is not intended to replace advice given to you by your health care   provider. Make sure you discuss any questions you have with your health care provider. ° °

## 2014-12-10 NOTE — MAU Note (Signed)
Urine in lab 

## 2014-12-10 NOTE — MAU Note (Signed)
Pt presents to MAU with c/o cramping and contractions.

## 2014-12-10 NOTE — MAU Note (Signed)
Pt called MD office, was advised to come to MAU for cramping & contractions that started last night.  Pt states she started having a white discharge today, denies bleeding or LOF.

## 2014-12-28 ENCOUNTER — Encounter (HOSPITAL_COMMUNITY): Payer: Self-pay | Admitting: *Deleted

## 2014-12-28 ENCOUNTER — Inpatient Hospital Stay (HOSPITAL_COMMUNITY)
Admission: AD | Admit: 2014-12-28 | Discharge: 2014-12-28 | Disposition: A | Discharge status: Home / Self Care | Payer: Medicare Other | Source: Ambulatory Visit | Attending: Obstetrics and Gynecology | Admitting: Obstetrics and Gynecology

## 2014-12-28 DIAGNOSIS — Z3A37 37 weeks gestation of pregnancy: Secondary | ICD-10-CM | POA: Insufficient documentation

## 2014-12-28 NOTE — MAU Note (Signed)
Contracting every 5 min. No bleeding or leaking, was ft when last checked.

## 2014-12-28 NOTE — Discharge Instructions (Signed)
Third Trimester of Pregnancy The third trimester is from week 29 through week 42, months 7 through 9. The third trimester is a time when the fetus is growing rapidly. At the end of the ninth month, the fetus is about 20 inches in length and weighs 6-10 pounds.  BODY CHANGES Your body goes through many changes during pregnancy. The changes vary from woman to woman.   Your weight will continue to increase. You can expect to gain 25-35 pounds (11-16 kg) by the end of the pregnancy.  You may begin to get stretch marks on your hips, abdomen, and breasts.  You may urinate more often because the fetus is moving lower into your pelvis and pressing on your bladder.  You may develop or continue to have heartburn as a result of your pregnancy.  You may develop constipation because certain hormones are causing the muscles that push waste through your intestines to slow down.  You may develop hemorrhoids or swollen, bulging veins (varicose veins).  You may have pelvic pain because of the weight gain and pregnancy hormones relaxing your joints between the bones in your pelvis. Backaches may result from overexertion of the muscles supporting your posture.  You may have changes in your hair. These can include thickening of your hair, rapid growth, and changes in texture. Some women also have hair loss during or after pregnancy, or hair that feels dry or thin. Your hair will most likely return to normal after your baby is born.  Your breasts will continue to grow and be tender. A yellow discharge may leak from your breasts called colostrum.  Your belly button may stick out.  You may feel short of breath because of your expanding uterus.  You may notice the fetus "dropping," or moving lower in your abdomen.  You may have a bloody mucus discharge. This usually occurs a few days to a week before labor begins.  Your cervix becomes thin and soft (effaced) near your due date. WHAT TO EXPECT AT YOUR PRENATAL  EXAMS  You will have prenatal exams every 2 weeks until week 36. Then, you will have weekly prenatal exams. During a routine prenatal visit:  You will be weighed to make sure you and the fetus are growing normally.  Your blood pressure is taken.  Your abdomen will be measured to track your baby's growth.  The fetal heartbeat will be listened to.  Any test results from the previous visit will be discussed.  You may have a cervical check near your due date to see if you have effaced. At around 36 weeks, your caregiver will check your cervix. At the same time, your caregiver will also perform a test on the secretions of the vaginal tissue. This test is to determine if a type of bacteria, Group B streptococcus, is present. Your caregiver will explain this further. Your caregiver may ask you:  What your birth plan is.  How you are feeling.  If you are feeling the baby move.  If you have had any abnormal symptoms, such as leaking fluid, bleeding, severe headaches, or abdominal cramping.  If you have any questions. Other tests or screenings that may be performed during your third trimester include:  Blood tests that check for low iron levels (anemia).  Fetal testing to check the health, activity level, and growth of the fetus. Testing is done if you have certain medical conditions or if there are problems during the pregnancy. FALSE LABOR You may feel small, irregular contractions that  eventually go away. These are called Braxton Hicks contractions, or false labor. Contractions may last for hours, days, or even weeks before true labor sets in. If contractions come at regular intervals, intensify, or become painful, it is best to be seen by your caregiver.  SIGNS OF LABOR   Menstrual-like cramps.  Contractions that are 5 minutes apart or less.  Contractions that start on the top of the uterus and spread down to the lower abdomen and back.  A sense of increased pelvic pressure or back  pain.  A watery or bloody mucus discharge that comes from the vagina. If you have any of these signs before the 37th week of pregnancy, call your caregiver right away. You need to go to the hospital to get checked immediately. HOME CARE INSTRUCTIONS   Avoid all smoking, herbs, alcohol, and unprescribed drugs. These chemicals affect the formation and growth of the baby.  Follow your caregiver's instructions regarding medicine use. There are medicines that are either safe or unsafe to take during pregnancy.  Exercise only as directed by your caregiver. Experiencing uterine cramps is a good sign to stop exercising.  Continue to eat regular, healthy meals.  Wear a good support bra for breast tenderness.  Do not use hot tubs, steam rooms, or saunas.  Wear your seat belt at all times when driving.  Avoid raw meat, uncooked cheese, cat litter boxes, and soil used by cats. These carry germs that can cause birth defects in the baby.  Take your prenatal vitamins.  Try taking a stool softener (if your caregiver approves) if you develop constipation. Eat more high-fiber foods, such as fresh vegetables or fruit and whole grains. Drink plenty of fluids to keep your urine clear or pale yellow.  Take warm sitz baths to soothe any pain or discomfort caused by hemorrhoids. Use hemorrhoid cream if your caregiver approves.  If you develop varicose veins, wear support hose. Elevate your feet for 15 minutes, 3-4 times a day. Limit salt in your diet.  Avoid heavy lifting, wear low heal shoes, and practice good posture.  Rest a lot with your legs elevated if you have leg cramps or low back pain.  Visit your dentist if you have not gone during your pregnancy. Use a soft toothbrush to brush your teeth and be gentle when you floss.  A sexual relationship may be continued unless your caregiver directs you otherwise.  Do not travel far distances unless it is absolutely necessary and only with the approval  of your caregiver.  Take prenatal classes to understand, practice, and ask questions about the labor and delivery.  Make a trial run to the hospital.  Pack your hospital bag.  Prepare the baby's nursery.  Continue to go to all your prenatal visits as directed by your caregiver. SEEK MEDICAL CARE IF:  You are unsure if you are in labor or if your water has broken.  You have dizziness.  You have mild pelvic cramps, pelvic pressure, or nagging pain in your abdominal area.  You have persistent nausea, vomiting, or diarrhea.  You have a bad smelling vaginal discharge.  You have pain with urination. SEEK IMMEDIATE MEDICAL CARE IF:   You have a fever.  You are leaking fluid from your vagina.  You have spotting or bleeding from your vagina.  You have severe abdominal cramping or pain.  You have rapid weight loss or gain.  You have shortness of breath with chest pain.  You notice sudden or extreme swelling  of your face, hands, ankles, feet, or legs.  You have not felt your baby move in over an hour.  You have severe headaches that do not go away with medicine.  You have vision changes. Document Released: 09/18/2001 Document Revised: 09/29/2013 Document Reviewed: 11/25/2012 [facility] Patient Information 2015 [place], [place]. This information is not intended to replace advice given to you by your health care provider. Make sure you discuss any questions you have with your health care provider. Fetal Movement Counts Patient Name: __________________________________________________ Patient Due Date: ____________________ Performing a fetal movement count is highly recommended in high-risk pregnancies, but it is good for every pregnant woman to do. Your health care provider may ask you to start counting fetal movements at 28 weeks of the pregnancy. Fetal movements often increase:  After eating a full meal.  After physical activity.  After eating or drinking something sweet or  cold.  At rest. Pay attention to when you feel the baby is most active. This will help you notice a pattern of your baby's sleep and wake cycles and what factors contribute to an increase in fetal movement. It is important to perform a fetal movement count at the same time each day when your baby is normally most active.  HOW TO COUNT FETAL MOVEMENTS  Find a quiet and comfortable area to sit or lie down on your left side. Lying on your left side provides the best blood and oxygen circulation to your baby.  Write down the day and time on a sheet of paper or in a journal.  Start counting kicks, flutters, swishes, rolls, or jabs in a 2-hour period. You should feel at least 10 movements within 2 hours.  If you do not feel 10 movements in 2 hours, wait 2-3 hours and count again. Look for a change in the pattern or not enough counts in 2 hours. SEEK MEDICAL CARE IF:  You feel less than 10 counts in 2 hours, tried twice.  There is no movement in over an hour.  The pattern is changing or taking longer each day to reach 10 counts in 2 hours.  You feel the baby is not moving as he or she usually does. Date: ____________ Movements: ____________ Start time: ____________ Elizebeth Koller time: ____________  Date: ____________ Movements: ____________ Start time: ____________ Elizebeth Koller time: ____________ Date: ____________ Movements: ____________ Start time: ____________ Elizebeth Koller time: ____________ Date: ____________ Movements: ____________ Start time: ____________ Elizebeth Koller time: ____________ Date: ____________ Movements: ____________ Start time: ____________ Elizebeth Koller time: ____________ Date: ____________ Movements: ____________ Start time: ____________ Elizebeth Koller time: ____________ Date: ____________ Movements: ____________ Start time: ____________ Elizebeth Koller time: ____________ Date: ____________ Movements: ____________ Start time: ____________ Elizebeth Koller time: ____________  Date: ____________ Movements: ____________ Start time:  ____________ Elizebeth Koller time: ____________ Date: ____________ Movements: ____________ Start time: ____________ Elizebeth Koller time: ____________ Date: ____________ Movements: ____________ Start time: ____________ Elizebeth Koller time: ____________ Date: ____________ Movements: ____________ Start time: ____________ Elizebeth Koller time: ____________ Date: ____________ Movements: ____________ Start time: ____________ Elizebeth Koller time: ____________ Date: ____________ Movements: ____________ Start time: ____________ Elizebeth Koller time: ____________ Date: ____________ Movements: ____________ Start time: ____________ Elizebeth Koller time: ____________  Date: ____________ Movements: ____________ Start time: ____________ Elizebeth Koller time: ____________ Date: ____________ Movements: ____________ Start time: ____________ Elizebeth Koller time: ____________ Date: ____________ Movements: ____________ Start time: ____________ Elizebeth Koller time: ____________ Date: ____________ Movements: ____________ Start time: ____________ Elizebeth Koller time: ____________ Date: ____________ Movements: ____________ Start time: ____________ Elizebeth Koller time: ____________ Date: ____________ Movements: ____________ Start time: ____________ Elizebeth Koller time: ____________ Date: ____________ Movements: ____________ Start time: ____________ Elizebeth Koller time:  ____________  Date: ____________ Movements: ____________ Start time: ____________ Elizebeth Koller time: ____________ Date: ____________ Movements: ____________ Start time: ____________ Elizebeth Koller time: ____________ Date: ____________ Movements: ____________ Start time: ____________ Elizebeth Koller time: ____________ Date: ____________ Movements: ____________ Start time: ____________ Elizebeth Koller time: ____________ Date: ____________ Movements: ____________ Start time: ____________ Elizebeth Koller time: ____________ Date: ____________ Movements: ____________ Start time: ____________ Elizebeth Koller time: ____________ Date: ____________ Movements: ____________ Start time: ____________ Elizebeth Koller time: ____________  Date:  ____________ Movements: ____________ Start time: ____________ Elizebeth Koller time: ____________ Date: ____________ Movements: ____________ Start time: ____________ Elizebeth Koller time: ____________ Date: ____________ Movements: ____________ Start time: ____________ Elizebeth Koller time: ____________ Date: ____________ Movements: ____________ Start time: ____________ Elizebeth Koller time: ____________ Date: ____________ Movements: ____________ Start time: ____________ Elizebeth Koller time: ____________ Date: ____________ Movements: ____________ Start time: ____________ Elizebeth Koller time: ____________ Date: ____________ Movements: ____________ Start time: ____________ Elizebeth Koller time: ____________  Date: ____________ Movements: ____________ Start time: ____________ Elizebeth Koller time: ____________ Date: ____________ Movements: ____________ Start time: ____________ Elizebeth Koller time: ____________ Date: ____________ Movements: ____________ Start time: ____________ Elizebeth Koller time: ____________ Date: ____________ Movements: ____________ Start time: ____________ Elizebeth Koller time: ____________ Date: ____________ Movements: ____________ Start time: ____________ Elizebeth Koller time: ____________ Date: ____________ Movements: ____________ Start time: ____________ Elizebeth Koller time: ____________ Date: ____________ Movements: ____________ Start time: ____________ Elizebeth Koller time: ____________  Date: ____________ Movements: ____________ Start time: ____________ Elizebeth Koller time: ____________ Date: ____________ Movements: ____________ Start time: ____________ Elizebeth Koller time: ____________ Date: ____________ Movements: ____________ Start time: ____________ Elizebeth Koller time: ____________ Date: ____________ Movements: ____________ Start time: ____________ Elizebeth Koller time: ____________ Date: ____________ Movements: ____________ Start time: ____________ Elizebeth Koller time: ____________ Date: ____________ Movements: ____________ Start time: ____________ Elizebeth Koller time: ____________ Date: ____________ Movements: ____________ Start  time: ____________ Elizebeth Koller time: ____________  Date: ____________ Movements: ____________ Start time: ____________ Elizebeth Koller time: ____________ Date: ____________ Movements: ____________ Start time: ____________ Elizebeth Koller time: ____________ Date: ____________ Movements: ____________ Start time: ____________ Elizebeth Koller time: ____________ Date: ____________ Movements: ____________ Start time: ____________ Elizebeth Koller time: ____________ Date: ____________ Movements: ____________ Start time: ____________ Elizebeth Koller time: ____________ Date: ____________ Movements: ____________ Start time: ____________ Elizebeth Koller time: ____________ Document Released: 10/24/2006 Document Revised: 02/08/2014 Document Reviewed: 07/21/2012 [employer] Patient Information 2015 [employer]. This information is not intended to replace advice given to you by your health care provider. Make sure you discuss any questions you have with your health care provider.

## 2015-01-01 ENCOUNTER — Encounter (HOSPITAL_COMMUNITY): Payer: Self-pay

## 2015-01-01 ENCOUNTER — Inpatient Hospital Stay (HOSPITAL_COMMUNITY)
Admission: AD | Admit: 2015-01-01 | Discharge: 2015-01-01 | Disposition: A | Discharge status: Home / Self Care | Payer: Medicare Other | Source: Ambulatory Visit | Attending: Obstetrics & Gynecology | Admitting: Obstetrics & Gynecology

## 2015-01-01 DIAGNOSIS — Z3A38 38 weeks gestation of pregnancy: Secondary | ICD-10-CM | POA: Diagnosis not present

## 2015-01-01 DIAGNOSIS — O9989 Other specified diseases and conditions complicating pregnancy, childbirth and the puerperium: Secondary | ICD-10-CM | POA: Diagnosis present

## 2015-01-01 LAB — POCT FERN TEST: POCT FERN TEST: NEGATIVE

## 2015-01-01 NOTE — Discharge Instructions (Signed)
Braxton Hicks Contractions °Contractions of the uterus can occur throughout pregnancy. Contractions are not always a sign that you are in labor.  °WHAT ARE BRAXTON HICKS CONTRACTIONS?  °Contractions that occur before labor are called Braxton Hicks contractions, or false labor. Toward the end of pregnancy (32-34 weeks), these contractions can develop more often and may become more forceful. This is not true labor because these contractions do not result in opening (dilatation) and thinning of the cervix. They are sometimes difficult to tell apart from true labor because these contractions can be forceful and people have different pain tolerances. You should not feel embarrassed if you go to the hospital with false labor. Sometimes, the only way to tell if you are in true labor is for your health care provider to look for changes in the cervix. °If there are no prenatal problems or other health problems associated with the pregnancy, it is completely safe to be sent home with false labor and await the onset of true labor. °HOW CAN YOU TELL THE DIFFERENCE BETWEEN TRUE AND FALSE LABOR? °False Labor °· The contractions of false labor are usually shorter and not as hard as those of true labor.   °· The contractions are usually irregular.   °· The contractions are often felt in the front of the lower abdomen and in the groin.   °· The contractions may go away when you walk around or change positions while lying down.   °· The contractions get weaker and are shorter lasting as time goes on.   °· The contractions do not usually become progressively stronger, regular, and closer together as with true labor.   °True Labor °· Contractions in true labor last 30-70 seconds, become very regular, usually become more intense, and increase in frequency.   °· The contractions do not go away with walking.   °· The discomfort is usually felt in the top of the uterus and spreads to the lower abdomen and low back.   °· True labor can be  determined by your health care provider with an exam. This will show that the cervix is dilating and getting thinner.   °WHAT TO REMEMBER °· Keep up with your usual exercises and follow other instructions given by your health care provider.   °· Take medicines as directed by your health care provider.   °· Keep your regular prenatal appointments.   °· Eat and drink lightly if you think you are going into labor.   °· If Braxton Hicks contractions are making you uncomfortable:   °¨ Change your position from lying down or resting to walking, or from walking to resting.   °¨ Sit and rest in a tub of warm water.   °¨ Drink 2-3 glasses of water. Dehydration may cause these contractions.   °¨ Do slow and deep breathing several times an hour.   °WHEN SHOULD I SEEK IMMEDIATE MEDICAL CARE? °Seek immediate medical care if: °· Your contractions become stronger, more regular, and closer together.   °· You have fluid leaking or gushing from your vagina.   °· You have a fever.   °· You pass blood-tinged mucus.   °· You have vaginal bleeding.   °· You have continuous abdominal pain.   °· You have low back pain that you never had before.   °· You feel your baby's head pushing down and causing pelvic pressure.   °· Your baby is not moving as much as it used to.   °Document Released: 09/24/2005 Document Revised: 09/29/2013 Document Reviewed: 07/06/2013 °ExitCare® Patient Information ©2015 [employer]. This information is not intended to replace advice given to you by your health care   provider. Make sure you discuss any questions you have with your health care provider. °Fetal Movement Counts °Patient Name: __________________________________________________ Patient Due Date: ____________________ °Performing a fetal movement count is highly recommended in high-risk pregnancies, but it is good for every pregnant woman to do. Your health care provider may ask you to start counting fetal movements at 28 weeks of the pregnancy. Fetal  movements often increase: °· After eating a full meal. °· After physical activity. °· After eating or drinking something sweet or cold. °· At rest. °Pay attention to when you feel the baby is most active. This will help you notice a pattern of your baby's sleep and wake cycles and what factors contribute to an increase in fetal movement. It is important to perform a fetal movement count at the same time each day when your baby is normally most active.  °HOW TO COUNT FETAL MOVEMENTS °· Find a quiet and comfortable area to sit or lie down on your left side. Lying on your left side provides the best blood and oxygen circulation to your baby. °· Write down the day and time on a sheet of paper or in a journal. °· Start counting kicks, flutters, swishes, rolls, or jabs in a 2-hour period. You should feel at least 10 movements within 2 hours. °· If you do not feel 10 movements in 2 hours, wait 2-3 hours and count again. Look for a change in the pattern or not enough counts in 2 hours. °SEEK MEDICAL CARE IF: °· You feel less than 10 counts in 2 hours, tried twice. °· There is no movement in over an hour. °· The pattern is changing or taking longer each day to reach 10 counts in 2 hours. °· You feel the baby is not moving as he or she usually does. °Date: ____________ Movements: ____________ Start time: ____________ Finish time: ____________  °Date: ____________ Movements: ____________ Start time: ____________ Finish time: ____________ °Date: ____________ Movements: ____________ Start time: ____________ Finish time: ____________ °Date: ____________ Movements: ____________ Start time: ____________ Finish time: ____________ °Date: ____________ Movements: ____________ Start time: ____________ Finish time: ____________ °Date: ____________ Movements: ____________ Start time: ____________ Finish time: ____________ °Date: ____________ Movements: ____________ Start time: ____________ Finish time: ____________ °Date: ____________  Movements: ____________ Start time: ____________ Finish time: ____________  °Date: ____________ Movements: ____________ Start time: ____________ Finish time: ____________ °Date: ____________ Movements: ____________ Start time: ____________ Finish time: ____________ °Date: ____________ Movements: ____________ Start time: ____________ Finish time: ____________ °Date: ____________ Movements: ____________ Start time: ____________ Finish time: ____________ °Date: ____________ Movements: ____________ Start time: ____________ Finish time: ____________ °Date: ____________ Movements: ____________ Start time: ____________ Finish time: ____________ °Date: ____________ Movements: ____________ Start time: ____________ Finish time: ____________  °Date: ____________ Movements: ____________ Start time: ____________ Finish time: ____________ °Date: ____________ Movements: ____________ Start time: ____________ Finish time: ____________ °Date: ____________ Movements: ____________ Start time: ____________ Finish time: ____________ °Date: ____________ Movements: ____________ Start time: ____________ Finish time: ____________ °Date: ____________ Movements: ____________ Start time: ____________ Finish time: ____________ °Date: ____________ Movements: ____________ Start time: ____________ Finish time: ____________ °Date: ____________ Movements: ____________ Start time: ____________ Finish time: ____________  °Date: ____________ Movements: ____________ Start time: ____________ Finish time: ____________ °Date: ____________ Movements: ____________ Start time: ____________ Finish time: ____________ °Date: ____________ Movements: ____________ Start time: ____________ Finish time: ____________ °Date: ____________ Movements: ____________ Start time: ____________ Finish time: ____________ °Date: ____________ Movements: ____________ Start time: ____________ Finish time: ____________ °Date: ____________ Movements: ____________ Start time:  ____________ Finish time: ____________ °Date: ____________ Movements: ____________   Start time: ____________ Finish time: ____________  °Date: ____________ Movements: ____________ Start time: ____________ Finish time: ____________ °Date: ____________ Movements: ____________ Start time: ____________ Finish time: ____________ °Date: ____________ Movements: ____________ Start time: ____________ Finish time: ____________ °Date: ____________ Movements: ____________ Start time: ____________ Finish time: ____________ °Date: ____________ Movements: ____________ Start time: ____________ Finish time: ____________ °Date: ____________ Movements: ____________ Start time: ____________ Finish time: ____________ °Date: ____________ Movements: ____________ Start time: ____________ Finish time: ____________  °Date: ____________ Movements: ____________ Start time: ____________ Finish time: ____________ °Date: ____________ Movements: ____________ Start time: ____________ Finish time: ____________ °Date: ____________ Movements: ____________ Start time: ____________ Finish time: ____________ °Date: ____________ Movements: ____________ Start time: ____________ Finish time: ____________ °Date: ____________ Movements: ____________ Start time: ____________ Finish time: ____________ °Date: ____________ Movements: ____________ Start time: ____________ Finish time: ____________ °Date: ____________ Movements: ____________ Start time: ____________ Finish time: ____________  °Date: ____________ Movements: ____________ Start time: ____________ Finish time: ____________ °Date: ____________ Movements: ____________ Start time: ____________ Finish time: ____________ °Date: ____________ Movements: ____________ Start time: ____________ Finish time: ____________ °Date: ____________ Movements: ____________ Start time: ____________ Finish time: ____________ °Date: ____________ Movements: ____________ Start time: ____________ Finish time: ____________ °Date:  ____________ Movements: ____________ Start time: ____________ Finish time: ____________ °Date: ____________ Movements: ____________ Start time: ____________ Finish time: ____________  °Date: ____________ Movements: ____________ Start time: ____________ Finish time: ____________ °Date: ____________ Movements: ____________ Start time: ____________ Finish time: ____________ °Date: ____________ Movements: ____________ Start time: ____________ Finish time: ____________ °Date: ____________ Movements: ____________ Start time: ____________ Finish time: ____________ °Date: ____________ Movements: ____________ Start time: ____________ Finish time: ____________ °Date: ____________ Movements: ____________ Start time: ____________ Finish time: ____________ °Document Released: 10/24/2006 Document Revised: 02/08/2014 Document Reviewed: 07/21/2012 °Exit[employer]® Patient Information ©2015 [employer]. This information is not intended to replace advice given to you by your health care provider. Make sure you discuss any questions you have with your health care provider. ° °

## 2015-01-01 NOTE — MAU Provider Note (Signed)
[name] is a 29 y.o. [id number] at [redacted]w[redacted]d  who presents to MAU today complaining LOF since 1100 yesterday. She states frequent contractions every 5-7 minutes since yesterday evening. She denies any vaginal bleeding or gushes of fluid, but states a constant wetness since 1100 yesterday.   BP 112/58 mmHg  Pulse 71  Temp(Src) 98.2 F (36.8 C)  Resp 18  Ht 5\' 4"  (1.626 m)  Wt 183 lb (83.008 kg)  BMI 31.40 kg/m2  LMP 04/17/2014 GENERAL: Well-developed, well-nourished female in no acute distress.  HEENT: Normocephalic, atraumatic.  LUNGS: Normal effort HEART: Regular rate  ABDOMEN: Soft, nontender, nondistended.  PELVIC: Normal external female genitalia. Vagina is pink and rugated.  Normal discharge.  negative pooling. Gravid uterus.   EXTREMITIES: No cyanosis, clubbing, or edema  Fern - negative  Fetal Monitoring:  Baseline: 130 bpm, moderate variability, + accelerations, no decelerations Contractions: irregular, few  A: Membranes intact  P: Report given to RN to contact MD on call for further instructions  Luvenia Redden, PA-C 01/01/2015 2:36 AM

## 2015-01-01 NOTE — MAU Note (Signed)
Felt a pop this am when i got up about 1100 Friday and started leaking fld. Leaked fld all day. Contractions started about 1900. Fld is clear. (Did not wear a pad in to hospital)

## 2015-01-04 ENCOUNTER — Encounter (HOSPITAL_COMMUNITY): Payer: Self-pay | Admitting: *Deleted

## 2015-01-04 ENCOUNTER — Telehealth (HOSPITAL_COMMUNITY): Payer: Self-pay | Admitting: *Deleted

## 2015-01-04 LAB — OB RESULTS CONSOLE GBS: GBS: POSITIVE

## 2015-01-04 NOTE — Telephone Encounter (Signed)
Preadmission screen  

## 2015-01-10 ENCOUNTER — Inpatient Hospital Stay (HOSPITAL_COMMUNITY): Payer: Medicare Other | Admitting: Anesthesiology

## 2015-01-10 ENCOUNTER — Encounter (HOSPITAL_COMMUNITY): Payer: Self-pay

## 2015-01-10 ENCOUNTER — Inpatient Hospital Stay (HOSPITAL_COMMUNITY)
Admission: RE | Admit: 2015-01-10 | Discharge: 2015-01-12 | DRG: 775 | Disposition: A | Discharge status: Home / Self Care | Payer: Medicare Other | Source: Ambulatory Visit | Attending: Obstetrics & Gynecology | Admitting: Obstetrics & Gynecology

## 2015-01-10 DIAGNOSIS — O9989 Other specified diseases and conditions complicating pregnancy, childbirth and the puerperium: Secondary | ICD-10-CM | POA: Diagnosis present

## 2015-01-10 DIAGNOSIS — O99824 Streptococcus B carrier state complicating childbirth: Secondary | ICD-10-CM | POA: Diagnosis present

## 2015-01-10 DIAGNOSIS — Z833 Family history of diabetes mellitus: Secondary | ICD-10-CM

## 2015-01-10 DIAGNOSIS — D573 Sickle-cell trait: Secondary | ICD-10-CM | POA: Diagnosis present

## 2015-01-10 DIAGNOSIS — O9902 Anemia complicating childbirth: Secondary | ICD-10-CM | POA: Diagnosis present

## 2015-01-10 DIAGNOSIS — Z8249 Family history of ischemic heart disease and other diseases of the circulatory system: Secondary | ICD-10-CM | POA: Diagnosis not present

## 2015-01-10 DIAGNOSIS — Z3A39 39 weeks gestation of pregnancy: Secondary | ICD-10-CM | POA: Diagnosis present

## 2015-01-10 DIAGNOSIS — Z3490 Encounter for supervision of normal pregnancy, unspecified, unspecified trimester: Secondary | ICD-10-CM

## 2015-01-10 DIAGNOSIS — Z87891 Personal history of nicotine dependence: Secondary | ICD-10-CM | POA: Diagnosis not present

## 2015-01-10 LAB — TYPE AND SCREEN
ABO/RH(D): O POS
Antibody Screen: NEGATIVE

## 2015-01-10 LAB — CBC
HCT: 32.4 % — ABNORMAL LOW (ref 36.0–46.0)
Hemoglobin: 11.6 g/dL — ABNORMAL LOW (ref 12.0–15.0)
MCH: 28.1 pg (ref 26.0–34.0)
MCHC: 35.8 g/dL (ref 30.0–36.0)
MCV: 78.5 fL (ref 78.0–100.0)
Platelets: 201 10*3/uL (ref 150–400)
RBC: 4.13 MIL/uL (ref 3.87–5.11)
RDW: 14.8 % (ref 11.5–15.5)
WBC: 5.7 10*3/uL (ref 4.0–10.5)

## 2015-01-10 LAB — RPR: RPR Ser Ql: NONREACTIVE

## 2015-01-10 MED ORDER — PENICILLIN G POTASSIUM 5000000 UNITS IJ SOLR
5.0000 10*6.[IU] | Freq: Once | INTRAVENOUS | Status: AC
  Administered 2015-01-10: 5 10*6.[IU] via INTRAVENOUS
  Filled 2015-01-10: qty 5

## 2015-01-10 MED ORDER — ACETAMINOPHEN 325 MG PO TABS
650.0000 mg | ORAL_TABLET | ORAL | Status: DC | PRN
Start: 2015-01-10 — End: 2015-01-12
  Filled 2015-01-10 (×3): qty 2

## 2015-01-10 MED ORDER — LIDOCAINE HCL (PF) 1 % IJ SOLN
INTRAMUSCULAR | Status: DC | PRN
Start: 2015-01-10 — End: 2015-01-10
  Administered 2015-01-10 (×2): 8 mL

## 2015-01-10 MED ORDER — OXYTOCIN BOLUS FROM INFUSION
500.0000 mL | INTRAVENOUS | Status: DC
  Administered 2015-01-10: 500 mL via INTRAVENOUS

## 2015-01-10 MED ORDER — PHENYLEPHRINE 40 MCG/ML (10ML) SYRINGE FOR IV PUSH (FOR BLOOD PRESSURE SUPPORT)
80.0000 ug | PREFILLED_SYRINGE | INTRAVENOUS | Status: DC | PRN
  Filled 2015-01-10: qty 2

## 2015-01-10 MED ORDER — LACTATED RINGERS IV SOLN: 500.0000 mL | INTRAVENOUS | Status: DC | PRN

## 2015-01-10 MED ORDER — ONDANSETRON HCL 4 MG PO TABS
4.0000 mg | ORAL_TABLET | ORAL | Status: DC | PRN
Start: 2015-01-10 — End: 2015-01-12

## 2015-01-10 MED ORDER — OXYTOCIN 40 UNITS IN LACTATED RINGERS INFUSION - SIMPLE MED
62.5000 mL/h | INTRAVENOUS | Status: DC
Start: 2015-01-10 — End: 2015-01-10
  Administered 2015-01-10: 62.5 mL/h via INTRAVENOUS
  Filled 2015-01-10: qty 1000

## 2015-01-10 MED ORDER — ZOLPIDEM TARTRATE 5 MG PO TABS
5.0000 mg | ORAL_TABLET | Freq: Every evening | ORAL | Status: DC | PRN
Start: 2015-01-10 — End: 2015-01-12

## 2015-01-10 MED ORDER — DEXTROSE 5 % IV SOLN
2.5000 10*6.[IU] | INTRAVENOUS | Status: DC
  Administered 2015-01-10 (×2): 2.5 10*6.[IU] via INTRAVENOUS
  Filled 2015-01-10 (×5): qty 2.5

## 2015-01-10 MED ORDER — OXYCODONE-ACETAMINOPHEN 5-325 MG PO TABS: 2.0000 | ORAL_TABLET | ORAL | Status: DC | PRN

## 2015-01-10 MED ORDER — SENNOSIDES-DOCUSATE SODIUM 8.6-50 MG PO TABS
2.0000 | ORAL_TABLET | ORAL | Status: DC
  Administered 2015-01-10 – 2015-01-12 (×2): 2 via ORAL
  Filled 2015-01-10 (×2): qty 2

## 2015-01-10 MED ORDER — WITCH HAZEL-GLYCERIN EX PADS
1.0000 "application " | MEDICATED_PAD | CUTANEOUS | Status: DC | PRN
  Administered 2015-01-11: 1 via TOPICAL

## 2015-01-10 MED ORDER — SIMETHICONE 80 MG PO CHEW: 80.0000 mg | CHEWABLE_TABLET | ORAL | Status: DC | PRN

## 2015-01-10 MED ORDER — BENZOCAINE-MENTHOL 20-0.5 % EX AERO: 1.0000 "application " | INHALATION_SPRAY | CUTANEOUS | Status: DC | PRN

## 2015-01-10 MED ORDER — OXYTOCIN 40 UNITS IN LACTATED RINGERS INFUSION - SIMPLE MED
1.0000 m[IU]/min | INTRAVENOUS | Status: DC
  Administered 2015-01-10: 2 m[IU]/min via INTRAVENOUS

## 2015-01-10 MED ORDER — PRENATAL MULTIVITAMIN CH
1.0000 | ORAL_TABLET | Freq: Every day | ORAL | Status: DC
  Administered 2015-01-11: 1 via ORAL
  Filled 2015-01-10: qty 1

## 2015-01-10 MED ORDER — ACETAMINOPHEN 325 MG PO TABS: 650.0000 mg | ORAL_TABLET | ORAL | Status: DC | PRN

## 2015-01-10 MED ORDER — ONDANSETRON HCL 4 MG/2ML IJ SOLN: 4.0000 mg | Freq: Four times a day (QID) | INTRAMUSCULAR | Status: DC | PRN

## 2015-01-10 MED ORDER — CITRIC ACID-SODIUM CITRATE 334-500 MG/5ML PO SOLN: 30.0000 mL | ORAL | Status: DC | PRN

## 2015-01-10 MED ORDER — EPHEDRINE 5 MG/ML INJ
10.0000 mg | INTRAVENOUS | Status: DC | PRN
  Filled 2015-01-10: qty 2

## 2015-01-10 MED ORDER — LIDOCAINE HCL (PF) 1 % IJ SOLN
30.0000 mL | INTRAMUSCULAR | Status: DC | PRN
  Filled 2015-01-10: qty 30

## 2015-01-10 MED ORDER — LACTATED RINGERS IV SOLN
500.0000 mL | Freq: Once | INTRAVENOUS | Status: AC
  Administered 2015-01-10: 1000 mL via INTRAVENOUS

## 2015-01-10 MED ORDER — OXYCODONE-ACETAMINOPHEN 5-325 MG PO TABS: 1.0000 | ORAL_TABLET | ORAL | Status: DC | PRN

## 2015-01-10 MED ORDER — FENTANYL 2.5 MCG/ML BUPIVACAINE 1/10 % EPIDURAL INFUSION (WH - ANES)
14.0000 mL/h | INTRAMUSCULAR | Status: DC | PRN
  Administered 2015-01-10: 14 mL/h via EPIDURAL
  Filled 2015-01-10 (×2): qty 125

## 2015-01-10 MED ORDER — EPHEDRINE 5 MG/ML INJ
10.0000 mg | INTRAVENOUS | Status: DC | PRN
Start: 2015-01-10 — End: 2015-01-10
  Filled 2015-01-10: qty 2

## 2015-01-10 MED ORDER — FENTANYL 2.5 MCG/ML BUPIVACAINE 1/10 % EPIDURAL INFUSION (WH - ANES)
INTRAMUSCULAR | Status: DC | PRN
  Administered 2015-01-10: 14 mL/h via EPIDURAL

## 2015-01-10 MED ORDER — DIPHENHYDRAMINE HCL 25 MG PO CAPS: 25.0000 mg | ORAL_CAPSULE | Freq: Four times a day (QID) | ORAL | Status: DC | PRN

## 2015-01-10 MED ORDER — LANOLIN HYDROUS EX OINT: TOPICAL_OINTMENT | CUTANEOUS | Status: DC | PRN

## 2015-01-10 MED ORDER — ONDANSETRON HCL 4 MG/2ML IJ SOLN
4.0000 mg | INTRAMUSCULAR | Status: DC | PRN
Start: 2015-01-10 — End: 2015-01-12

## 2015-01-10 MED ORDER — TERBUTALINE SULFATE 1 MG/ML IJ SOLN
0.2500 mg | Freq: Once | INTRAMUSCULAR | Status: DC | PRN
  Filled 2015-01-10: qty 1

## 2015-01-10 MED ORDER — IBUPROFEN 600 MG PO TABS
600.0000 mg | ORAL_TABLET | Freq: Four times a day (QID) | ORAL | Status: DC
  Administered 2015-01-10 – 2015-01-12 (×6): 600 mg via ORAL
  Filled 2015-01-10 (×7): qty 1

## 2015-01-10 MED ORDER — PHENYLEPHRINE 40 MCG/ML (10ML) SYRINGE FOR IV PUSH (FOR BLOOD PRESSURE SUPPORT)
80.0000 ug | PREFILLED_SYRINGE | INTRAVENOUS | Status: DC | PRN
  Filled 2015-01-10: qty 20
  Filled 2015-01-10: qty 2

## 2015-01-10 MED ORDER — DIPHENHYDRAMINE HCL 50 MG/ML IJ SOLN: 12.5000 mg | INTRAMUSCULAR | Status: DC | PRN

## 2015-01-10 MED ORDER — FLEET ENEMA 7-19 GM/118ML RE ENEM: 1.0000 | ENEMA | RECTAL | Status: DC | PRN

## 2015-01-10 MED ORDER — DIBUCAINE 1 % RE OINT
1.0000 "application " | TOPICAL_OINTMENT | RECTAL | Status: DC | PRN
  Administered 2015-01-11: 1 via RECTAL
  Filled 2015-01-10: qty 28

## 2015-01-10 MED ORDER — LACTATED RINGERS IV SOLN
INTRAVENOUS | Status: DC
  Administered 2015-01-10 (×2): via INTRAVENOUS

## 2015-01-10 MED ORDER — TETANUS-DIPHTH-ACELL PERTUSSIS 5-2.5-18.5 LF-MCG/0.5 IM SUSP: 0.5000 mL | Freq: Once | INTRAMUSCULAR | Status: DC

## 2015-01-10 NOTE — Progress Notes (Signed)
Foley bulb placed with 37DK NS without complication.  Linda Hedges, DO

## 2015-01-10 NOTE — Anesthesia Preprocedure Evaluation (Signed)
Anesthesia Evaluation  Patient identified by MRN, date of birth, ID band Patient awake    Reviewed: Allergy & Precautions, H&P , NPO status , Patient's Chart, lab work & pertinent test results  Airway Mallampati: II  TM Distance: >3 FB Neck ROM: full    Dental no notable dental hx.    Pulmonary former smoker,    Pulmonary exam normal       Cardiovascular negative cardio ROS      Neuro/Psych    GI/Hepatic negative GI ROS, Neg liver ROS,   Endo/Other  negative endocrine ROS  Renal/GU negative Renal ROS     Musculoskeletal   Abdominal Normal abdominal exam  (+)   Peds  Hematology negative hematology ROS (+)   Anesthesia Other Findings   Reproductive/Obstetrics (+) Pregnancy                             Anesthesia Physical Anesthesia Plan  ASA: II  Anesthesia Plan: Epidural   Post-op Pain Management:    Induction:   Airway Management Planned:   Additional Equipment:   Intra-op Plan:   Post-operative Plan:   Informed Consent: I have reviewed the patients History and Physical, chart, labs and discussed the procedure including the risks, benefits and alternatives for the proposed anesthesia with the patient or authorized representative who has indicated his/her understanding and acceptance.     Plan Discussed with:   Anesthesia Plan Comments:         Anesthesia Quick Evaluation

## 2015-01-10 NOTE — H&P (Signed)
Tiffany Hall is a 29 y.o. female presenting for IOL at 54 weeks, multip, fav cvx.  Antepartum course complicated by sickle cell trait, alpha thal trait; declines genetic counseling.  FOB declined testing.  Trich this pregnancy; negative TOC.  B/L renal pyelectasis with negative first tri screen.  GBS positive.    Maternal Medical History:  Fetal activity: Perceived fetal activity is normal.   Last perceived fetal movement was within the past hour.    Prenatal complications: no prenatal complications Prenatal Complications - Diabetes: none.    OB History    Gravida Para Term Preterm AB TAB SAB Ectopic Multiple Living   6 4 4  0 1 0 1 0 0 4     Past Medical History  Diagnosis Date  . Migraine headache   . Ovarian cyst   . Sickle cell trait   . Gallstones 2012  . Chlamydia 2008  . Trichomonas 2008  . Gonorrhea 2008  . Heart murmur     as child  . Abnormal pap 2009    Colpo - unknown results  . Dental caries   . Blood transfusion without reported diagnosis     after D&E 2009  . Depression     age 39   Past Surgical History  Procedure Laterality Date  . Colposcopy    . Dilation and curettage of uterus      required blood transfusion   Family History: family history includes Cancer (age of onset: 48) in her mother; Cancer (age of onset: 7) in her maternal grandmother; Diabetes in her maternal grandmother and mother; Hepatitis in her mother; Hypertension in her maternal grandmother and mother. There is no history of Anesthesia problems or Other. Social History:  reports that she quit smoking about 4 years ago. Her smoking use included Cigarettes. She has never used smokeless tobacco. She reports that she does not drink alcohol or use illicit drugs.   Prenatal Transfer Tool  Maternal Diabetes: No Genetic Screening: Normal Maternal Ultrasounds/Referrals: Normal Fetal Ultrasounds or other Referrals:  Other: bilateral renal pyelectasis Maternal Substance Abuse:   No Significant Maternal Medications:  None Significant Maternal Lab Results:  Lab values include: Group B Strep positive Other Comments:  None  ROS  Dilation: 1.5 Effacement (%): 50 Station: -2 Exam by:: Varnell Orvis Blood pressure 115/65, pulse 73, temperature 98.9 F (37.2 C), temperature source Oral, resp. rate 18, height 5\' 4"  (1.626 m), weight 181 lb (82.101 kg), last menstrual period 04/17/2014. Maternal Exam:  Uterine Assessment: Contraction strength is mild.  Contraction frequency is rare.   Abdomen: Patient reports no abdominal tenderness. Fundal height is c/w dates.   Estimated fetal weight is 8#.   Fetal presentation: vertex  Introitus: Normal vulva. Pelvis: adequate for delivery.   Cervix: Cervix evaluated by digital exam.     Physical Exam  Constitutional: She is oriented to person, place, and time. She appears well-developed and well-nourished.  GI: Soft. There is no rebound and no guarding.  Neurological: She is alert and oriented to person, place, and time.  Skin: Skin is warm and dry.  Psychiatric: She has a normal mood and affect. Her behavior is normal.    Prenatal labs: ABO, Rh: O/Positive/-- (09/15 0000) Antibody: Negative (09/15 0000) Rubella: Immune (09/15 0000) RPR: Nonreactive (09/15 0000)  HBsAg: Negative (09/15 0000)  HIV: Non-reactive (09/15 0000)  GBS: Positive (03/29 0000)   Assessment/Plan: 12YQ [id number] for IOL -GBS positive-PCN  -Will start pitocin -AROM when able -Anticipate NSVD   Junko Ohagan 01/10/2015,  8:14 AM

## 2015-01-10 NOTE — Anesthesia Procedure Notes (Signed)
Epidural Patient location during procedure: OB Start time: 01/10/2015 1:05 PM End time: 01/10/2015 1:09 PM  Staffing Anesthesiologist: Lyn Hollingshead Performed by: anesthesiologist   Preanesthetic Checklist Completed: patient identified, surgical consent, pre-op evaluation, timeout performed, IV checked, risks and benefits discussed and monitors and equipment checked  Epidural Patient position: sitting Prep: site prepped and draped and DuraPrep Patient monitoring: continuous pulse ox and blood pressure Approach: midline Location: L3-L4 Injection technique: LOR air  Needle:  Needle type: Tuohy  Needle gauge: 17 G Needle length: 9 cm and 9 Needle insertion depth: 6 cm Catheter type: closed end flexible Catheter size: 19 Gauge Catheter at skin depth: 10 cm Test dose: negative and Other  Assessment Sensory level: T9 Events: blood not aspirated, injection not painful, no injection resistance, negative IV test and no paresthesia  Additional Notes Reason for block:procedure for pain

## 2015-01-11 LAB — CBC
HEMATOCRIT: 31.4 % — AB (ref 36.0–46.0)
HEMOGLOBIN: 11 g/dL — AB (ref 12.0–15.0)
MCH: 27.3 pg (ref 26.0–34.0)
MCHC: 35 g/dL (ref 30.0–36.0)
MCV: 77.9 fL — ABNORMAL LOW (ref 78.0–100.0)
PLATELETS: 204 10*3/uL (ref 150–400)
RBC: 4.03 MIL/uL (ref 3.87–5.11)
RDW: 14.7 % (ref 11.5–15.5)
WBC: 9.9 10*3/uL (ref 4.0–10.5)

## 2015-01-11 NOTE — Anesthesia Postprocedure Evaluation (Signed)
Anesthesia Post Note  Patient: Tiffany Hall  Procedure(s) Performed: * No procedures listed *  Anesthesia type: Epidural  Patient location: Mother/Baby  Post pain: Pain level controlled  Post assessment: Post-op Vital signs reviewed  Last Vitals:  Filed Vitals:   01/11/15 0320  BP: 108/62  Pulse: 82  Temp: 37 C  Resp: 18    Post vital signs: Reviewed  Level of consciousness:alert  Complications: No apparent anesthesia complications

## 2015-01-11 NOTE — Progress Notes (Signed)
MOB was referred for history of depression/anxiety.  Referral is screened out by Clinical Social Worker because none of the following criteria appear to apply: -History of anxiety/depression during this pregnancy, or of post-partum depression. - Diagnosis of anxiety and/or depression within last 3 years or -MOB's symptoms are currently being treated with medication and/or therapy.  CSW completed chart review. Per medical records, MOB had symptoms of depression/anxiety at age 52. No recent symptoms documented/noted in her chart.  Please contact the Clinical Social Worker if needs arise or upon MOB request.   Lucita Ferrara, [place] Social Worker [phone]

## 2015-01-11 NOTE — Lactation Note (Signed)
This note was copied from the chart of [place]. Lactation Consultation Note  Patient Name: Tiffany Hall SUPJS'R Date: 01/11/2015 Reason for consult: Initial assessment Baby 20 hours of life. Patient's nurse reports that mom having difficulty latching baby. Mom tried to nurse 2 of her 4 older children, but they did not latch well and she had mastitis twice. Assisted mom to latch baby to right breast in football position. Mom states that she used cradle with two of her older children. Mom able to hand express colostrum, and baby cueing to latch. Demonstrated to mom how to compress breast and bring baby onto breast when baby's mouth opened wide. Baby latched deeply, suckling rhythmically with a few swallows noted. Demonstrated to mom how to flange lower lip outward. Mom reports increased comfort. Baby suckled well for 10 minutes. Baby pulled away, and began rooting for breast again. Enc mom to keep baby tucked in tight with the apples of his cheeks touching her breast. Mom's nipple round, not pinched when baby came off. Mom able to latch baby to breast again, and LC assessed another 5 minutes of feeding. Enc mom to nurse with cues, and at least 8-12 times/24 hours. Enc mom to post-pump for 15 minutes after nursing since baby has missed a lot of feedings and this will also assist with pulling mom's left nipple out more. Enc mom to give baby whatever EBM she gets from pumping. Mom given [facility] brochure, aware of OP/BFSG, community resources, and [facility] phone line assistance after D/C. Enc mom to call [facility] office for an appointment and to discuss DEBP from [facility]. Enc mom to call for assistance as needed with latching/pumping. Discussed assessment and interventions with patient's RN, Levada Dy.  Maternal Data Has patient been taught Hand Expression?: Yes Does the patient have breastfeeding experience prior to this delivery?: Yes  Feeding Feeding Type: Breast Fed Length of feed:  (LC assessed first 15  minutes of BF.)  LATCH Score/Interventions Latch: Grasps breast easily, tongue down, lips flanged, rhythmical sucking. Intervention(s): Adjust position;Assist with latch;Breast compression  Audible Swallowing: A few with stimulation Intervention(s): Skin to skin;Hand expression  Type of Nipple: Flat (Short shaft.) Intervention(s): Double electric pump  Comfort (Breast/Nipple): Soft / non-tender     Hold (Positioning): Assistance needed to correctly position infant at breast and maintain latch.  LATCH Score: 7  Lactation Tools Discussed/Used Pump Review: Setup, frequency, and cleaning Initiated by:: [facility] RN, Levada Dy Date initiated:: 01/11/15   Consult Status Consult Status: Follow-up Date: 01/12/15 Follow-up type: In-patient    Inocente Salles 01/11/2015, 4:22 PM

## 2015-01-11 NOTE — Progress Notes (Signed)
Post Partum Day 1 Subjective: no complaints, up ad lib, voiding, tolerating PO and + flatus  Objective: Blood pressure 108/62, pulse 82, temperature 98.6 F (37 C), temperature source Axillary, resp. rate 18, height 5\' 4"  (1.626 m), weight 181 lb (82.101 kg), last menstrual period 04/17/2014, SpO2 99 %, unknown if currently breastfeeding.  Physical Exam:  General: alert and cooperative Lochia: appropriate Uterine Fundus: firm Incision: perineum intact DVT Evaluation: No evidence of DVT seen on physical exam. Negative Homan's sign. No cords or calf tenderness. No significant calf/ankle edema.   Recent Labs  01/10/15 0740 01/11/15 0555  HGB 11.6* 11.0*  HCT 32.4* 31.4*    Assessment/Plan: Plan for discharge tomorrow and Circumcision prior to discharge   LOS: 1 day   CURTIS,CAROL G 01/11/2015, 8:18 AM

## 2015-01-11 NOTE — Progress Notes (Signed)
UR chart review completed.  

## 2015-01-12 MED ORDER — OXYCODONE-ACETAMINOPHEN 5-325 MG PO TABS: 1.0000 | ORAL_TABLET | ORAL | Status: DC | PRN

## 2015-01-12 MED ORDER — IBUPROFEN 100 MG/5ML PO SUSP
600.0000 mg | Freq: Four times a day (QID) | ORAL | Status: DC
  Filled 2015-01-12: qty 30

## 2015-01-12 MED ORDER — OXYCODONE-ACETAMINOPHEN 5-325 MG PO TABS
1.0000 | ORAL_TABLET | ORAL | Status: DC | PRN
Start: 2015-01-12 — End: 2015-01-12

## 2015-01-12 MED ORDER — IBUPROFEN 100 MG/5ML PO SUSP: 600.0000 mg | Freq: Four times a day (QID) | ORAL | Status: DC

## 2015-01-12 NOTE — Progress Notes (Signed)
Post Partum Day 2 Subjective: up ad lib, voiding, tolerating PO, + flatus and c/o R sided pain. Denies dysuria  Objective: Blood pressure 112/69, pulse 79, temperature 98.4 F (36.9 C), temperature source Oral, resp. rate 18, height 5\' 4"  (1.626 m), weight 181 lb (82.101 kg), last menstrual period 04/17/2014, SpO2 99 %, unknown if currently breastfeeding.  Physical Exam:  General: alert and cooperative Lochia: appropriate Uterine Fundus: firm, non- tender, scant lochia Incision: healing well DVT Evaluation: No evidence of DVT seen on physical exam. Negative Homan's sign. No cords or calf tenderness. No significant calf/ankle edema. No CVAT  Recent Labs  01/10/15 0740 01/11/15 0555  HGB 11.6* 11.0*  HCT 32.4* 31.4*    Assessment/Plan: Discharge home   LOS: 2 days   CURTIS,CAROL G 01/12/2015, 8:17 AM

## 2015-01-12 NOTE — Discharge Summary (Signed)
Obstetric Discharge Summary Reason for Admission: induction of labor Prenatal Procedures: ultrasound Intrapartum Procedures: spontaneous vaginal delivery Postpartum Procedures: none Complications-Operative and Postpartum: none HEMOGLOBIN  Date Value Ref Range Status  01/11/2015 11.0* 12.0 - 15.0 g/dL Final   HCT  Date Value Ref Range Status  01/11/2015 31.4* 36.0 - 46.0 % Final    Physical Exam:  General: alert and cooperative Lochia: appropriate Uterine Fundus: firm Incision: healing well DVT Evaluation: No evidence of DVT seen on physical exam. Negative Homan's sign. No cords or calf tenderness.  Discharge Diagnoses: Term Pregnancy-delivered  Discharge Information: Date: 01/12/2015 Activity: pelvic rest Diet: routine Medications: PNV, Ibuprofen and Percocet Condition: stable Instructions: refer to practice specific booklet Discharge to: home   Newborn Data: Live born female  Birth Weight: 7 lb 3.5 oz (3275 g) APGAR: 9, 9  Home with mother.  CURTIS,CAROL G 01/12/2015, 8:26 AM

## 2015-01-12 NOTE — Lactation Note (Signed)
This note was copied from the chart of [place]. Lactation Consultation Note; Mom has given bottles of formula through the night because her nipples were sore. Has comfort gels for them and reports they are feeling better today. Has DEBP set up in room, reports she has pumped 3 times and only obtained a few drops. Encouragement given. Plans to call [facility] about pump for home.  Baby awake and fussy- mom trying to give him pacifier to calm him. Offered assist with latch and mom agreeable.  On and off the breast-  Fussy. Encouraged mom to hand express and massage breast while nursing so he gets a taste and continues nursing. Mom able to hand express a few drops of Colostrum. After 5 min baby going off to sleep and mom wants to take him off. Reviewed supply and demand and encouraged nursing or pumping to promote a good milk supply. Reviewed engorgement prevention and treatment.,. Mom states she knows how that feels and wants to prevent that. Mom complaining of cramping.  No questions at present. Reviewed our phone number to call with questions/concerns.   Patient Name: Tiffany Hall [id number]'N Date: 01/12/2015 Reason for consult: Follow-up assessment   Maternal Data Formula Feeding for Exclusion: No Has patient been taught Hand Expression?: Yes Does the patient have breastfeeding experience prior to this delivery?: Yes  Feeding Feeding Type: Breast Fed  LATCH Score/Interventions Latch: Repeated attempts needed to sustain latch, nipple held in mouth throughout feeding, stimulation needed to elicit sucking reflex.  Audible Swallowing: None  Type of Nipple: Everted at rest and after stimulation  Comfort (Breast/Nipple): Filling, red/small blisters or bruises, mild/mod discomfort  Problem noted: Mild/Moderate discomfort Interventions (Mild/moderate discomfort): Hand expression;Comfort gels  Hold (Positioning): Assistance needed to correctly position infant at breast and maintain  latch. Intervention(s): Breastfeeding basics reviewed;Support Pillows  LATCH Score: 5  Lactation Tools Discussed/Used WIC Program: Yes   Consult Status Consult Status: Complete    Truddie Crumble 01/12/2015, 8:55 AM

## 2015-01-15 ENCOUNTER — Encounter (HOSPITAL_COMMUNITY): Payer: Self-pay

## 2015-01-15 ENCOUNTER — Inpatient Hospital Stay (HOSPITAL_COMMUNITY)
Admission: AD | Admit: 2015-01-15 | Discharge: 2015-01-17 | DRG: 776 | Disposition: A | Discharge status: Home / Self Care | Payer: Medicare Other | Source: Ambulatory Visit | Attending: Obstetrics and Gynecology | Admitting: Obstetrics and Gynecology

## 2015-01-15 ENCOUNTER — Inpatient Hospital Stay (HOSPITAL_COMMUNITY): Payer: Medicare Other

## 2015-01-15 DIAGNOSIS — O1495 Unspecified pre-eclampsia, complicating the puerperium: Secondary | ICD-10-CM

## 2015-01-15 DIAGNOSIS — Z87891 Personal history of nicotine dependence: Secondary | ICD-10-CM | POA: Diagnosis not present

## 2015-01-15 DIAGNOSIS — O9089 Other complications of the puerperium, not elsewhere classified: Principal | ICD-10-CM | POA: Diagnosis present

## 2015-01-15 DIAGNOSIS — R42 Dizziness and giddiness: Secondary | ICD-10-CM | POA: Diagnosis present

## 2015-01-15 DIAGNOSIS — Z8249 Family history of ischemic heart disease and other diseases of the circulatory system: Secondary | ICD-10-CM | POA: Diagnosis not present

## 2015-01-15 DIAGNOSIS — I158 Other secondary hypertension: Secondary | ICD-10-CM | POA: Diagnosis present

## 2015-01-15 DIAGNOSIS — Z833 Family history of diabetes mellitus: Secondary | ICD-10-CM | POA: Diagnosis not present

## 2015-01-15 DIAGNOSIS — R072 Precordial pain: Secondary | ICD-10-CM | POA: Diagnosis present

## 2015-01-15 HISTORY — DX: Unspecified pre-eclampsia, complicating the puerperium: O14.95

## 2015-01-15 LAB — URINE MICROSCOPIC-ADD ON

## 2015-01-15 LAB — LACTATE DEHYDROGENASE: LDH: 319 U/L — ABNORMAL HIGH (ref 94–250)

## 2015-01-15 LAB — URINALYSIS, ROUTINE W REFLEX MICROSCOPIC
BILIRUBIN URINE: NEGATIVE
GLUCOSE, UA: NEGATIVE mg/dL
Ketones, ur: NEGATIVE mg/dL
LEUKOCYTES UA: NEGATIVE
Nitrite: NEGATIVE
PH: 5.5 (ref 5.0–8.0)
PROTEIN: NEGATIVE mg/dL
Specific Gravity, Urine: 1.02 (ref 1.005–1.030)
Urobilinogen, UA: 0.2 mg/dL (ref 0.0–1.0)

## 2015-01-15 LAB — COMPREHENSIVE METABOLIC PANEL
ALT: 59 U/L — ABNORMAL HIGH (ref 0–35)
AST: 38 U/L — AB (ref 0–37)
Albumin: 2.9 g/dL — ABNORMAL LOW (ref 3.5–5.2)
Alkaline Phosphatase: 112 U/L (ref 39–117)
Anion gap: 8 (ref 5–15)
BUN: 13 mg/dL (ref 6–23)
CALCIUM: 9.2 mg/dL (ref 8.4–10.5)
CHLORIDE: 106 mmol/L (ref 96–112)
CO2: 26 mmol/L (ref 19–32)
Creatinine, Ser: 0.6 mg/dL (ref 0.50–1.10)
GFR calc non Af Amer: >90 mL/min (ref >90)
Glucose, Bld: 86 mg/dL (ref 70–99)
POTASSIUM: 3.8 mmol/L (ref 3.5–5.1)
Sodium: 140 mmol/L (ref 135–145)
TOTAL PROTEIN: 6.1 g/dL (ref 6.0–8.3)
Total Bilirubin: 0.4 mg/dL (ref 0.3–1.2)

## 2015-01-15 LAB — CBC
HCT: 32.2 % — ABNORMAL LOW (ref 36.0–46.0)
Hemoglobin: 11.4 g/dL — ABNORMAL LOW (ref 12.0–15.0)
MCH: 28.1 pg (ref 26.0–34.0)
MCHC: 35.4 g/dL (ref 30.0–36.0)
MCV: 79.3 fL (ref 78.0–100.0)
PLATELETS: 280 10*3/uL (ref 150–400)
RBC: 4.06 MIL/uL (ref 3.87–5.11)
RDW: 14.3 % (ref 11.5–15.5)
WBC: 5.4 10*3/uL (ref 4.0–10.5)

## 2015-01-15 LAB — URIC ACID: URIC ACID, SERUM: 6.6 mg/dL (ref 2.4–7.0)

## 2015-01-15 LAB — PROTEIN / CREATININE RATIO, URINE
CREATININE, URINE: 131 mg/dL
PROTEIN CREATININE RATIO: 0.08 (ref 0.00–0.15)
Total Protein, Urine: 11 mg/dL

## 2015-01-15 MED ORDER — BENZOCAINE-MENTHOL 20-0.5 % EX AERO: 1.0000 "application " | INHALATION_SPRAY | CUTANEOUS | Status: DC | PRN

## 2015-01-15 MED ORDER — PRENATAL MULTIVITAMIN CH: 1.0000 | ORAL_TABLET | Freq: Every day | ORAL | Status: DC

## 2015-01-15 MED ORDER — OXYCODONE-ACETAMINOPHEN 5-325 MG PO TABS
1.0000 | ORAL_TABLET | ORAL | Status: DC | PRN
  Administered 2015-01-17: 1 via ORAL
  Filled 2015-01-15: qty 1

## 2015-01-15 MED ORDER — GI COCKTAIL ~~LOC~~: 30.0000 mL | Freq: Once | ORAL | Status: DC

## 2015-01-15 MED ORDER — ZOLPIDEM TARTRATE 5 MG PO TABS
5.0000 mg | ORAL_TABLET | Freq: Every evening | ORAL | Status: DC | PRN
Start: 2015-01-15 — End: 2015-01-17

## 2015-01-15 MED ORDER — TETANUS-DIPHTH-ACELL PERTUSSIS 5-2.5-18.5 LF-MCG/0.5 IM SUSP: 0.5000 mL | Freq: Once | INTRAMUSCULAR | Status: DC

## 2015-01-15 MED ORDER — LACTATED RINGERS IV SOLN
INTRAVENOUS | Status: DC
  Administered 2015-01-15 – 2015-01-17 (×4): via INTRAVENOUS

## 2015-01-15 MED ORDER — LANOLIN HYDROUS EX OINT: TOPICAL_OINTMENT | CUTANEOUS | Status: DC | PRN

## 2015-01-15 MED ORDER — ACETAMINOPHEN 325 MG PO TABS: 650.0000 mg | ORAL_TABLET | ORAL | Status: DC | PRN

## 2015-01-15 MED ORDER — FLEET ENEMA 7-19 GM/118ML RE ENEM: 1.0000 | ENEMA | Freq: Every day | RECTAL | Status: DC | PRN

## 2015-01-15 MED ORDER — MAGNESIUM SULFATE 40 G IN LACTATED RINGERS - SIMPLE
2.0000 g/h | INTRAVENOUS | Status: DC
  Administered 2015-01-15 – 2015-01-16 (×2): 2 g/h via INTRAVENOUS
  Filled 2015-01-15 (×2): qty 500

## 2015-01-15 MED ORDER — SENNOSIDES-DOCUSATE SODIUM 8.6-50 MG PO TABS
2.0000 | ORAL_TABLET | ORAL | Status: DC
  Filled 2015-01-15: qty 2

## 2015-01-15 MED ORDER — IBUPROFEN 600 MG PO TABS
600.0000 mg | ORAL_TABLET | Freq: Four times a day (QID) | ORAL | Status: DC
  Administered 2015-01-15 – 2015-01-16 (×2): 600 mg via ORAL
  Filled 2015-01-15 (×2): qty 1

## 2015-01-15 MED ORDER — MAGNESIUM SULFATE BOLUS VIA INFUSION
4.0000 g | Freq: Once | INTRAVENOUS | Status: AC
  Administered 2015-01-15: 4 g via INTRAVENOUS
  Filled 2015-01-15: qty 500

## 2015-01-15 MED ORDER — BISACODYL 10 MG RE SUPP
10.0000 mg | Freq: Every day | RECTAL | Status: DC | PRN
Start: 2015-01-15 — End: 2015-01-17
  Filled 2015-01-15: qty 1

## 2015-01-15 MED ORDER — SIMETHICONE 80 MG PO CHEW
80.0000 mg | CHEWABLE_TABLET | ORAL | Status: DC | PRN
  Administered 2015-01-17: 80 mg via ORAL
  Filled 2015-01-15: qty 1

## 2015-01-15 MED ORDER — DIPHENHYDRAMINE HCL 25 MG PO CAPS: 25.0000 mg | ORAL_CAPSULE | Freq: Four times a day (QID) | ORAL | Status: DC | PRN

## 2015-01-15 MED ORDER — ONDANSETRON HCL 4 MG/2ML IJ SOLN: 4.0000 mg | INTRAMUSCULAR | Status: DC | PRN

## 2015-01-15 MED ORDER — DIBUCAINE 1 % RE OINT: 1.0000 "application " | TOPICAL_OINTMENT | RECTAL | Status: DC | PRN

## 2015-01-15 MED ORDER — ONDANSETRON HCL 4 MG PO TABS: 4.0000 mg | ORAL_TABLET | ORAL | Status: DC | PRN

## 2015-01-15 MED ORDER — OXYCODONE-ACETAMINOPHEN 5-325 MG PO TABS: 2.0000 | ORAL_TABLET | ORAL | Status: DC | PRN

## 2015-01-15 MED ORDER — WITCH HAZEL-GLYCERIN EX PADS: 1.0000 "application " | MEDICATED_PAD | CUTANEOUS | Status: DC | PRN

## 2015-01-15 NOTE — MAU Provider Note (Signed)
History     CSN: [id number]  Arrival date and time: 01/15/15 1436   None     Chief Complaint  Patient presents with  . Postpartum Complications  . Shortness of Breath  . Dizziness   HPI  Pt is post partum Day 5 and c/o of dizziness last night after she pumped her breasts.  Pt also c/o of left frontal pain- she took Motrin with relief. Pt is having small amount of swelling in her left ankle- no pain or tenderness in her calf or leg Pt denies complications with pregnancy or delivery- pt is breast feeding Pt denies heavy bleeding or cramping  Past Medical History  Diagnosis Date  . Migraine headache   . Ovarian cyst   . Sickle cell trait   . Gallstones 2012  . Chlamydia 2008  . Trichomonas 2008  . Gonorrhea 2008  . Heart murmur     as child  . Abnormal pap 2009    Colpo - unknown results  . Dental caries   . Blood transfusion without reported diagnosis     after D&E 2009  . Depression     age 17    Past Surgical History  Procedure Laterality Date  . Colposcopy    . Dilation and curettage of uterus      required blood transfusion    Family History  Problem Relation Age of Onset  . Diabetes Mother   . Hypertension Mother   . Hepatitis Mother   . Cancer Mother 5    Breast, cervical  . Diabetes Maternal Grandmother   . Hypertension Maternal Grandmother   . Cancer Maternal Grandmother 40    Breast  . Anesthesia problems Neg Hx   . Other Neg Hx     History  Substance Use Topics  . Smoking status: Former Smoker    Types: Cigarettes    Quit date: 11/05/2010  . Smokeless tobacco: Never Used     Comment: quit with preg  . Alcohol Use: No     Comment: occ    Allergies:  Allergies  Allergen Reactions  . Nubain [Nalbuphine Hcl] Swelling and Palpitations    Pt says she can take percocet without any problems    Prescriptions prior to admission  Medication Sig Dispense Refill Last Dose  . ibuprofen (ADVIL,MOTRIN) 100 MG/5ML suspension Take 30 mLs (600  mg total) by mouth every 6 (six) hours. 473 mL 0 01/15/2015 at Unknown time  . oxyCODONE-acetaminophen (PERCOCET/ROXICET) 5-325 MG per tablet Take 1 tablet by mouth every 4 (four) hours as needed for moderate pain. 30 tablet 0 01/14/2015 at Unknown time  . Prenatal Vit-Fe Fumarate-FA (PRENATAL MULTIVITAMIN) TABS tablet Take 1 tablet by mouth daily.    01/15/2015 at Unknown time    Review of Systems  Constitutional: Negative for fever and chills.  Eyes: Negative for blurred vision, double vision and photophobia.  Gastrointestinal: Negative for nausea, vomiting, abdominal pain and diarrhea.  Neurological: Positive for dizziness and headaches.   Physical Exam   Blood pressure 140/91, pulse 52, temperature 99.1 F (37.3 C), temperature source Oral, resp. rate 16, height 5\' 4"  (1.626 m), weight 177 lb (80.287 kg), last menstrual period 04/17/2014, SpO2 100 %, currently breastfeeding.  Physical Exam  Nursing note and vitals reviewed. Constitutional: She is oriented to person, place, and time. She appears well-developed and well-nourished. No distress.  HENT:  Head: Normocephalic.  Eyes: Pupils are equal, round, and reactive to light.  Neck: Normal range of motion. Neck supple.  Cardiovascular: Normal rate.   Respiratory: Effort normal.  GI: Soft. She exhibits no distension. There is no tenderness. There is no rebound and no guarding.  Musculoskeletal: Normal range of motion.  Mild edema left ankle- no pain or tenderness or swelling in rest of leg or calf  Neurological: She is alert and oriented to person, place, and time.  Skin: Skin is warm and dry.  Psychiatric: She has a normal mood and affect.    MAU Course  Procedures Pt c/o of mid sternal chest pain on and off And pt has SOB not associated with inspiration-  New onset EKG ordered- normal sinus rhythm except for bradycardia No headache Results for orders placed or performed during the hospital encounter of 01/15/15 (from the past 24  hour(s))  Urinalysis, Routine w reflex microscopic     Status: Abnormal   Collection Time: 01/15/15  2:36 PM  Result Value Ref Range   Color, Urine YELLOW YELLOW   APPearance CLEAR CLEAR   Specific Gravity, Urine 1.020 1.005 - 1.030   pH 5.5 5.0 - 8.0   Glucose, UA NEGATIVE NEGATIVE mg/dL   Hgb urine dipstick MODERATE (A) NEGATIVE   Bilirubin Urine NEGATIVE NEGATIVE   Ketones, ur NEGATIVE NEGATIVE mg/dL   Protein, ur NEGATIVE NEGATIVE mg/dL   Urobilinogen, UA 0.2 0.0 - 1.0 mg/dL   Nitrite NEGATIVE NEGATIVE   Leukocytes, UA NEGATIVE NEGATIVE  Urine microscopic-add on     Status: Abnormal   Collection Time: 01/15/15  2:36 PM  Result Value Ref Range   Squamous Epithelial / LPF FEW (A) RARE   WBC, UA 0-2 <3 WBC/hpf   RBC / HPF 3-6 <3 RBC/hpf   Bacteria, UA FEW (A) RARE  Protein / creatinine ratio, urine     Status: None   Collection Time: 01/15/15  2:36 PM  Result Value Ref Range   Creatinine, Urine 131.00 mg/dL   Total Protein, Urine 11 mg/dL   Protein Creatinine Ratio 0.08 0.00 - 0.15  CBC     Status: Abnormal   Collection Time: 01/15/15  4:03 PM  Result Value Ref Range   WBC 5.4 4.0 - 10.5 K/uL   RBC 4.06 3.87 - 5.11 MIL/uL   Hemoglobin 11.4 (L) 12.0 - 15.0 g/dL   HCT 32.2 (L) 36.0 - 46.0 %   MCV 79.3 78.0 - 100.0 fL   MCH 28.1 26.0 - 34.0 pg   MCHC 35.4 30.0 - 36.0 g/dL   RDW 14.3 11.5 - 15.5 %   Platelets 280 150 - 400 K/uL  Comprehensive metabolic panel     Status: Abnormal   Collection Time: 01/15/15  4:03 PM  Result Value Ref Range   Sodium 140 135 - 145 mmol/L   Potassium 3.8 3.5 - 5.1 mmol/L   Chloride 106 96 - 112 mmol/L   CO2 26 19 - 32 mmol/L   Glucose, Bld 86 70 - 99 mg/dL   BUN 13 6 - 23 mg/dL   Creatinine, Ser 0.60 0.50 - 1.10 mg/dL   Calcium 9.2 8.4 - 10.5 mg/dL   Total Protein 6.1 6.0 - 8.3 g/dL   Albumin 2.9 (L) 3.5 - 5.2 g/dL   AST 38 (H) 0 - 37 U/L   ALT 59 (H) 0 - 35 U/L   Alkaline Phosphatase 112 39 - 117 U/L   Total Bilirubin 0.4 0.3 - 1.2  mg/dL   GFR calc non Af Amer >90 >90 mL/min   GFR calc Af Amer >90 >90 mL/min  Anion gap 8 5 - 15  Uric acid     Status: None   Collection Time: 01/15/15  4:03 PM  Result Value Ref Range   Uric Acid, Serum 6.6 2.4 - 7.0 mg/dL  Lactate dehydrogenase     Status: Abnormal   Collection Time: 01/15/15  4:03 PM  Result Value Ref Range   LDH 319 (H) 94 - 250 U/L  discussed with Dr. Gaetano Net- will admit BP range 140-158/90-97 pulse 45-51bpm Assessment and Plan    Andrew Blasius 01/15/2015, 3:22 PM

## 2015-01-15 NOTE — H&P (Signed)
Tiffany Hall is an 29 y.o. female S/P vaginal delivery about 6 days ago. Yesterday C/O occasional dizziness while sitting down breast feeding yesterday and today. Intermittent sharp pain behind upper sternum. No pain right now. Also, HA off and on. No N/V, no diaphoresis.  Pertinent Gynecological History: Menses: post partum Bleeding: N/A Contraception: PP DES exposure: denies Blood transfusions: none Sexually transmitted diseases: no past history Previous GYN Procedures: N/A  Last mammogram: none Date: N/A Last pap: normal Date: 18 OB History: G6, P5   Menstrual History: Menarche age: unknown  Patient's last menstrual period was 04/17/2014.    Past Medical History  Diagnosis Date  . Migraine headache   . Ovarian cyst   . Sickle cell trait   . Gallstones 2012  . Chlamydia 2008  . Trichomonas 2008  . Gonorrhea 2008  . Heart murmur     as child  . Abnormal pap 2009    Colpo - unknown results  . Dental caries   . Blood transfusion without reported diagnosis     after D&E 2009  . Depression     age 89    Past Surgical History  Procedure Laterality Date  . Colposcopy    . Dilation and curettage of uterus      required blood transfusion    Family History  Problem Relation Age of Onset  . Diabetes Mother   . Hypertension Mother   . Hepatitis Mother   . Cancer Mother 32    Breast, cervical  . Diabetes Maternal Grandmother   . Hypertension Maternal Grandmother   . Cancer Maternal Grandmother 40    Breast  . Anesthesia problems Neg Hx   . Other Neg Hx     Social History:  reports that she quit smoking about 4 years ago. Her smoking use included Cigarettes. She has never used smokeless tobacco. She reports that she does not drink alcohol or use illicit drugs.  Allergies:  Allergies  Allergen Reactions  . Nubain [Nalbuphine Hcl] Swelling and Palpitations    Pt says she can take percocet without any problems    Prescriptions prior to admission   Medication Sig Dispense Refill Last Dose  . ibuprofen (ADVIL,MOTRIN) 100 MG/5ML suspension Take 30 mLs (600 mg total) by mouth every 6 (six) hours. 473 mL 0 01/15/2015 at Unknown time  . oxyCODONE-acetaminophen (PERCOCET/ROXICET) 5-325 MG per tablet Take 1 tablet by mouth every 4 (four) hours as needed for moderate pain. 30 tablet 0 01/14/2015 at Unknown time  . Prenatal Vit-Fe Fumarate-FA (PRENATAL MULTIVITAMIN) TABS tablet Take 1 tablet by mouth daily.    01/15/2015 at Unknown time    Review of Systems  Constitutional: Negative for fever.  HENT:       H/A off and on   Eyes: Negative for blurred vision.  Respiratory: Negative for shortness of breath.        Intermittent precordial pain-brief since yesterday  Gastrointestinal: Negative for abdominal pain.  Neurological: Positive for headaches.    Blood pressure 175/96, pulse 50, temperature 99.1 F (37.3 C), temperature source Oral, resp. rate 16, height 5\' 4"  (1.626 m), weight 177 lb (80.287 kg), last menstrual period 04/17/2014, SpO2 100 %, currently breastfeeding. Physical Exam  Cardiovascular: Regular rhythm.   HR 50-60s  Respiratory: Effort normal and breath sounds normal.  GI: Soft. There is no tenderness.  Neurological: She has normal reflexes.    Results for orders placed or performed during the hospital encounter of 01/15/15 (from the past 24 hour(s))  Urinalysis, Routine w reflex microscopic     Status: Abnormal   Collection Time: 01/15/15  2:36 PM  Result Value Ref Range   Color, Urine YELLOW YELLOW   APPearance CLEAR CLEAR   Specific Gravity, Urine 1.020 1.005 - 1.030   pH 5.5 5.0 - 8.0   Glucose, UA NEGATIVE NEGATIVE mg/dL   Hgb urine dipstick MODERATE (A) NEGATIVE   Bilirubin Urine NEGATIVE NEGATIVE   Ketones, ur NEGATIVE NEGATIVE mg/dL   Protein, ur NEGATIVE NEGATIVE mg/dL   Urobilinogen, UA 0.2 0.0 - 1.0 mg/dL   Nitrite NEGATIVE NEGATIVE   Leukocytes, UA NEGATIVE NEGATIVE  Urine microscopic-add on     Status:  Abnormal   Collection Time: 01/15/15  2:36 PM  Result Value Ref Range   Squamous Epithelial / LPF FEW (A) RARE   WBC, UA 0-2 <3 WBC/hpf   RBC / HPF 3-6 <3 RBC/hpf   Bacteria, UA FEW (A) RARE  Protein / creatinine ratio, urine     Status: None   Collection Time: 01/15/15  2:36 PM  Result Value Ref Range   Creatinine, Urine 131.00 mg/dL   Total Protein, Urine 11 mg/dL   Protein Creatinine Ratio 0.08 0.00 - 0.15  CBC     Status: Abnormal   Collection Time: 01/15/15  4:03 PM  Result Value Ref Range   WBC 5.4 4.0 - 10.5 K/uL   RBC 4.06 3.87 - 5.11 MIL/uL   Hemoglobin 11.4 (L) 12.0 - 15.0 g/dL   HCT 32.2 (L) 36.0 - 46.0 %   MCV 79.3 78.0 - 100.0 fL   MCH 28.1 26.0 - 34.0 pg   MCHC 35.4 30.0 - 36.0 g/dL   RDW 14.3 11.5 - 15.5 %   Platelets 280 150 - 400 K/uL  Comprehensive metabolic panel     Status: Abnormal   Collection Time: 01/15/15  4:03 PM  Result Value Ref Range   Sodium 140 135 - 145 mmol/L   Potassium 3.8 3.5 - 5.1 mmol/L   Chloride 106 96 - 112 mmol/L   CO2 26 19 - 32 mmol/L   Glucose, Bld 86 70 - 99 mg/dL   BUN 13 6 - 23 mg/dL   Creatinine, Ser 0.60 0.50 - 1.10 mg/dL   Calcium 9.2 8.4 - 10.5 mg/dL   Total Protein 6.1 6.0 - 8.3 g/dL   Albumin 2.9 (L) 3.5 - 5.2 g/dL   AST 38 (H) 0 - 37 U/L   ALT 59 (H) 0 - 35 U/L   Alkaline Phosphatase 112 39 - 117 U/L   Total Bilirubin 0.4 0.3 - 1.2 mg/dL   GFR calc non Af Amer >90 >90 mL/min   GFR calc Af Amer >90 >90 mL/min   Anion gap 8 5 - 15  Uric acid     Status: None   Collection Time: 01/15/15  4:03 PM  Result Value Ref Range   Uric Acid, Serum 6.6 2.4 - 7.0 mg/dL  Lactate dehydrogenase     Status: Abnormal   Collection Time: 01/15/15  4:03 PM  Result Value Ref Range   LDH 319 (H) 94 - 250 U/L    No results found.  Assessment/Plan: 29 YO with post partum preeclampsia Admit for magnesium sulfate for seizure prophylaxis CXR, serial labs D/W patient and husband They state they understand and agree  Autumn Gunn  II,Jodean Valade E 01/15/2015, 5:58 PM

## 2015-01-15 NOTE — Progress Notes (Signed)
EKG Normal sinus brady. Chest pain has gone per pt.

## 2015-01-15 NOTE — Progress Notes (Signed)
Pt reports she is having sharp mid sternal chest pain. B/P 175/96. Notified S.Lineberry,NP EKG  Ordered.

## 2015-01-15 NOTE — MAU Note (Signed)
Pt states starting last pm began having shortness of breath/chest heaviness. Headache yesterday, none today. Also has had dizziness. No visual changes. Left leg is slightly swollen. Took 600mg  ibuprofen 1 hour ago.

## 2015-01-16 LAB — COMPREHENSIVE METABOLIC PANEL
ALBUMIN: 2.8 g/dL — AB (ref 3.5–5.2)
ALT: 112 U/L — ABNORMAL HIGH (ref 0–35)
ALT: 176 U/L — ABNORMAL HIGH (ref 0–35)
AST: 76 U/L — AB (ref 0–37)
AST: 123 U/L — AB (ref 0–37)
Albumin: 2.7 g/dL — ABNORMAL LOW (ref 3.5–5.2)
Alkaline Phosphatase: 119 U/L — ABNORMAL HIGH (ref 39–117)
Alkaline Phosphatase: 114 U/L (ref 39–117)
Anion gap: 7 (ref 5–15)
Anion gap: 6 (ref 5–15)
BILIRUBIN TOTAL: 0.3 mg/dL (ref 0.3–1.2)
BUN: 9 mg/dL (ref 6–23)
BUN: 9 mg/dL (ref 6–23)
CALCIUM: 7.6 mg/dL — AB (ref 8.4–10.5)
CHLORIDE: 109 mmol/L (ref 96–112)
CHLORIDE: 108 mmol/L (ref 96–112)
CO2: 24 mmol/L (ref 19–32)
CO2: 25 mmol/L (ref 19–32)
CREATININE: 0.47 mg/dL — AB (ref 0.50–1.10)
CREATININE: 0.59 mg/dL (ref 0.50–1.10)
Calcium: 8 mg/dL — ABNORMAL LOW (ref 8.4–10.5)
GFR calc Af Amer: >90 mL/min (ref >90)
GFR calc Af Amer: >90 mL/min (ref >90)
GFR calc non Af Amer: >90 mL/min (ref >90)
GFR calc non Af Amer: >90 mL/min (ref >90)
Glucose, Bld: 85 mg/dL (ref 70–99)
Glucose, Bld: 106 mg/dL — ABNORMAL HIGH (ref 70–99)
POTASSIUM: 3.6 mmol/L (ref 3.5–5.1)
Potassium: 3.6 mmol/L (ref 3.5–5.1)
SODIUM: 140 mmol/L (ref 135–145)
Sodium: 139 mmol/L (ref 135–145)
Total Bilirubin: 0.5 mg/dL (ref 0.3–1.2)
Total Protein: 5.7 g/dL — ABNORMAL LOW (ref 6.0–8.3)
Total Protein: 5.7 g/dL — ABNORMAL LOW (ref 6.0–8.3)

## 2015-01-16 LAB — CBC
HCT: 31.8 % — ABNORMAL LOW (ref 36.0–46.0)
HEMATOCRIT: 32.1 % — AB (ref 36.0–46.0)
HEMOGLOBIN: 11.4 g/dL — AB (ref 12.0–15.0)
Hemoglobin: 11.2 g/dL — ABNORMAL LOW (ref 12.0–15.0)
MCH: 27.3 pg (ref 26.0–34.0)
MCH: 28.1 pg (ref 26.0–34.0)
MCHC: 34.9 g/dL (ref 30.0–36.0)
MCHC: 35.8 g/dL (ref 30.0–36.0)
MCV: 78.3 fL (ref 78.0–100.0)
MCV: 78.3 fL (ref 78.0–100.0)
Platelets: 275 10*3/uL (ref 150–400)
Platelets: 284 10*3/uL (ref 150–400)
RBC: 4.1 MIL/uL (ref 3.87–5.11)
RBC: 4.06 MIL/uL (ref 3.87–5.11)
RDW: 14.3 % (ref 11.5–15.5)
RDW: 14.1 % (ref 11.5–15.5)
WBC: 5.4 10*3/uL (ref 4.0–10.5)
WBC: 5.2 10*3/uL (ref 4.0–10.5)

## 2015-01-16 LAB — URIC ACID
URIC ACID, SERUM: 6.5 mg/dL (ref 2.4–7.0)
Uric Acid, Serum: 6.6 mg/dL (ref 2.4–7.0)

## 2015-01-16 LAB — MRSA PCR SCREENING: MRSA by PCR: NEGATIVE

## 2015-01-16 NOTE — Progress Notes (Signed)
No HA, no epigastric pain, no vision loss  VSS Afeb BPs stable 120-130s/70-80s UO excellent  Lungs CTA Cor RRR Abd soft NT, no epigastric tenderness DTR 2+  Results for orders placed or performed during the hospital encounter of 01/15/15 (from the past 48 hour(s))  Urinalysis, Routine w reflex microscopic     Status: Abnormal   Collection Time: 01/15/15  2:36 PM  Result Value Ref Range   Color, Urine YELLOW YELLOW   APPearance CLEAR CLEAR   Specific Gravity, Urine 1.020 1.005 - 1.030   pH 5.5 5.0 - 8.0   Glucose, UA NEGATIVE NEGATIVE mg/dL   Hgb urine dipstick MODERATE (A) NEGATIVE   Bilirubin Urine NEGATIVE NEGATIVE   Ketones, ur NEGATIVE NEGATIVE mg/dL   Protein, ur NEGATIVE NEGATIVE mg/dL   Urobilinogen, UA 0.2 0.0 - 1.0 mg/dL   Nitrite NEGATIVE NEGATIVE   Leukocytes, UA NEGATIVE NEGATIVE  Urine microscopic-add on     Status: Abnormal   Collection Time: 01/15/15  2:36 PM  Result Value Ref Range   Squamous Epithelial / LPF FEW (A) RARE   WBC, UA 0-2 <3 WBC/hpf   RBC / HPF 3-6 <3 RBC/hpf   Bacteria, UA FEW (A) RARE  Protein / creatinine ratio, urine     Status: None   Collection Time: 01/15/15  2:36 PM  Result Value Ref Range   Creatinine, Urine 131.00 mg/dL   Total Protein, Urine 11 mg/dL    Comment: NO NORMAL RANGE ESTABLISHED FOR THIS TEST   Protein Creatinine Ratio 0.08 0.00 - 0.15  CBC     Status: Abnormal   Collection Time: 01/15/15  4:03 PM  Result Value Ref Range   WBC 5.4 4.0 - 10.5 K/uL   RBC 4.06 3.87 - 5.11 MIL/uL   Hemoglobin 11.4 (L) 12.0 - 15.0 g/dL   HCT 32.2 (L) 36.0 - 46.0 %   MCV 79.3 78.0 - 100.0 fL   MCH 28.1 26.0 - 34.0 pg   MCHC 35.4 30.0 - 36.0 g/dL   RDW 14.3 11.5 - 15.5 %   Platelets 280 150 - 400 K/uL  Comprehensive metabolic panel     Status: Abnormal   Collection Time: 01/15/15  4:03 PM  Result Value Ref Range   Sodium 140 135 - 145 mmol/L   Potassium 3.8 3.5 - 5.1 mmol/L   Chloride 106 96 - 112 mmol/L   CO2 26 19 - 32 mmol/L   Glucose, Bld 86 70 - 99 mg/dL   BUN 13 6 - 23 mg/dL   Creatinine, Ser 0.60 0.50 - 1.10 mg/dL   Calcium 9.2 8.4 - 10.5 mg/dL   Total Protein 6.1 6.0 - 8.3 g/dL   Albumin 2.9 (L) 3.5 - 5.2 g/dL   AST 38 (H) 0 - 37 U/L   ALT 59 (H) 0 - 35 U/L   Alkaline Phosphatase 112 39 - 117 U/L   Total Bilirubin 0.4 0.3 - 1.2 mg/dL   GFR calc non Af Amer >90 >90 mL/min   GFR calc Af Amer >90 >90 mL/min    Comment: (NOTE) The eGFR has been calculated using the CKD EPI equation. This calculation has not been validated in all clinical situations. eGFR's persistently <90 mL/min signify possible Chronic Kidney Disease.    Anion gap 8 5 - 15  Uric acid     Status: None   Collection Time: 01/15/15  4:03 PM  Result Value Ref Range   Uric Acid, Serum 6.6 2.4 - 7.0 mg/dL  Lactate  dehydrogenase     Status: Abnormal   Collection Time: 01/15/15  4:03 PM  Result Value Ref Range   LDH 319 (H) 94 - 250 U/L  MRSA PCR Screening     Status: None   Collection Time: 01/16/15 12:11 AM  Result Value Ref Range   MRSA by PCR NEGATIVE NEGATIVE    Comment:        The GeneXpert MRSA Assay (FDA approved for NASAL specimens only), is one component of a comprehensive MRSA colonization surveillance program. It is not intended to diagnose MRSA infection nor to guide or monitor treatment for MRSA infections.   CBC     Status: Abnormal   Collection Time: 01/16/15  5:40 AM  Result Value Ref Range   WBC 5.4 4.0 - 10.5 K/uL   RBC 4.10 3.87 - 5.11 MIL/uL   Hemoglobin 11.2 (L) 12.0 - 15.0 g/dL   HCT 32.1 (L) 36.0 - 46.0 %   MCV 78.3 78.0 - 100.0 fL   MCH 27.3 26.0 - 34.0 pg   MCHC 34.9 30.0 - 36.0 g/dL   RDW 14.3 11.5 - 15.5 %   Platelets 275 150 - 400 K/uL  Comprehensive metabolic panel     Status: Abnormal   Collection Time: 01/16/15  5:40 AM  Result Value Ref Range   Sodium 140 135 - 145 mmol/L   Potassium 3.6 3.5 - 5.1 mmol/L   Chloride 109 96 - 112 mmol/L   CO2 24 19 - 32 mmol/L   Glucose, Bld 85 70 - 99  mg/dL   BUN 9 6 - 23 mg/dL   Creatinine, Ser 0.47 (L) 0.50 - 1.10 mg/dL   Calcium 8.0 (L) 8.4 - 10.5 mg/dL   Total Protein 5.7 (L) 6.0 - 8.3 g/dL   Albumin 2.7 (L) 3.5 - 5.2 g/dL   AST 76 (H) 0 - 37 U/L   ALT 112 (H) 0 - 35 U/L   Alkaline Phosphatase 119 (H) 39 - 117 U/L   Total Bilirubin 0.5 0.3 - 1.2 mg/dL   GFR calc non Af Amer >90 >90 mL/min   GFR calc Af Amer >90 >90 mL/min    Comment: (NOTE) The eGFR has been calculated using the CKD EPI equation. This calculation has not been validated in all clinical situations. eGFR's persistently <90 mL/min signify possible Chronic Kidney Disease.    Anion gap 7 5 - 15  Uric acid     Status: None   Collection Time: 01/16/15  5:40 AM  Result Value Ref Range   Uric Acid, Serum 6.5 2.4 - 7.0 mg/dL   A/P: PP Preeclampsia         Continue magnesium sulfate         Repeat labs  2pm

## 2015-01-16 NOTE — Progress Notes (Signed)
Lab results called to Dr. Vincente Poli continue with Magnesium gtt for now. Discussed plan with pt and FOB. Pt upset and wants to leave d/t lack of child care. Consulted with SW-no resources available for short term assistance that she requires.  Pt insist that she has to be present for her baby's appointment in the morning. FOB stated that he could not do it alone. FOB's mother unable to continue to stay with the children any longer. Discussed preeclampsia at length with both pt and FOB-verbalized understanding. Pt still insist on going home. Dr Gaetano Net to come and discuss with pt.

## 2015-01-16 NOTE — Progress Notes (Signed)
D/C'd IVF's at pt's request. Pt now waiting on MD prior to going home AMA. Dr. Gaetano Net aware.

## 2015-01-16 NOTE — Progress Notes (Signed)
No HA, no vision change, no epigastric pain  VSS Afeb Filed Vitals:   01/16/15 1900  BP: 120/80  Pulse:   Temp:   Resp:   UO good  Abdomen epigastrum NT  Results for orders placed or performed during the hospital encounter of 01/15/15 (from the past 24 hour(s))  MRSA PCR Screening     Status: None   Collection Time: 01/16/15 12:11 AM  Result Value Ref Range   MRSA by PCR NEGATIVE NEGATIVE  CBC     Status: Abnormal   Collection Time: 01/16/15  5:40 AM  Result Value Ref Range   WBC 5.4 4.0 - 10.5 K/uL   RBC 4.10 3.87 - 5.11 MIL/uL   Hemoglobin 11.2 (L) 12.0 - 15.0 g/dL   HCT 32.1 (L) 36.0 - 46.0 %   MCV 78.3 78.0 - 100.0 fL   MCH 27.3 26.0 - 34.0 pg   MCHC 34.9 30.0 - 36.0 g/dL   RDW 14.3 11.5 - 15.5 %   Platelets 275 150 - 400 K/uL  Comprehensive metabolic panel     Status: Abnormal   Collection Time: 01/16/15  5:40 AM  Result Value Ref Range   Sodium 140 135 - 145 mmol/L   Potassium 3.6 3.5 - 5.1 mmol/L   Chloride 109 96 - 112 mmol/L   CO2 24 19 - 32 mmol/L   Glucose, Bld 85 70 - 99 mg/dL   BUN 9 6 - 23 mg/dL   Creatinine, Ser 0.47 (L) 0.50 - 1.10 mg/dL   Calcium 8.0 (L) 8.4 - 10.5 mg/dL   Total Protein 5.7 (L) 6.0 - 8.3 g/dL   Albumin 2.7 (L) 3.5 - 5.2 g/dL   AST 76 (H) 0 - 37 U/L   ALT 112 (H) 0 - 35 U/L   Alkaline Phosphatase 119 (H) 39 - 117 U/L   Total Bilirubin 0.5 0.3 - 1.2 mg/dL   GFR calc non Af Amer >90 >90 mL/min   GFR calc Af Amer >90 >90 mL/min   Anion gap 7 5 - 15  Uric acid     Status: None   Collection Time: 01/16/15  5:40 AM  Result Value Ref Range   Uric Acid, Serum 6.5 2.4 - 7.0 mg/dL  CBC     Status: Abnormal   Collection Time: 01/16/15  2:08 PM  Result Value Ref Range   WBC 5.2 4.0 - 10.5 K/uL   RBC 4.06 3.87 - 5.11 MIL/uL   Hemoglobin 11.4 (L) 12.0 - 15.0 g/dL   HCT 31.8 (L) 36.0 - 46.0 %   MCV 78.3 78.0 - 100.0 fL   MCH 28.1 26.0 - 34.0 pg   MCHC 35.8 30.0 - 36.0 g/dL   RDW 14.1 11.5 - 15.5 %   Platelets 284 150 - 400 K/uL   Comprehensive metabolic panel     Status: Abnormal   Collection Time: 01/16/15  2:08 PM  Result Value Ref Range   Sodium 139 135 - 145 mmol/L   Potassium 3.6 3.5 - 5.1 mmol/L   Chloride 108 96 - 112 mmol/L   CO2 25 19 - 32 mmol/L   Glucose, Bld 106 (H) 70 - 99 mg/dL   BUN 9 6 - 23 mg/dL   Creatinine, Ser 0.59 0.50 - 1.10 mg/dL   Calcium 7.6 (L) 8.4 - 10.5 mg/dL   Total Protein 5.7 (L) 6.0 - 8.3 g/dL   Albumin 2.8 (L) 3.5 - 5.2 g/dL   AST 123 (H) 0 - 37 U/L  ALT 176 (H) 0 - 35 U/L   Alkaline Phosphatase 114 39 - 117 U/L   Total Bilirubin 0.3 0.3 - 1.2 mg/dL   GFR calc non Af Amer >90 >90 mL/min   GFR calc Af Amer >90 >90 mL/min   Anion gap 6 5 - 15  Uric acid     Status: None   Collection Time: 01/16/15  2:08 PM  Result Value Ref Range   Uric Acid, Serum 6.6 2.4 - 7.0 mg/dL   A/P: PP Preeclampsia with elevated LFTs         Patient conflicted over child care of her 4 children at home. I D/W patient and her husband above and increased risk of seizure activity with continued elevation of LFT. She considered leaving and is informed this would be AMA but we would still follow her closely as a patient. However, may still be at increased risk of seizure activity if going home  this pm. Patient considered above and decided to stay this pm. Magnesium sulfate infusion bag finished about 6:10 pm. Not yet restarted at patient request pending this conversation. She states she will stay and we can restart magnesium infusion. Will check labs at 2 am and if LFT decreasing, will D/C magnesium for a 6:30 am discharge. If LFT rise, will continue magnesium and consider further testing. Patient states she understands and agrees.

## 2015-01-16 NOTE — Plan of Care (Signed)
Problem: Consults Goal: General Medical Patient Education See Patient Education Module for specific education.  Outcome: Completed/Met Date Met:  01/16/15 Plan of care was  discussed with patient and had to be emphasized again today because patient wanted to sign out AMA.Dr Gaetano Net came and talked to patient about the problems this could cause and patient decided it would be in her best interest to stay.Magnesium drip continued.  Problem: Phase I Progression Outcomes Goal: Pain controlled with appropriate interventions Outcome: Completed/Met Date Met:  01/16/15 Has not taken anything but around the clock Motrin and that has been adequate. Goal: OOB as tolerated unless otherwise ordered Outcome: Completed/Met Date Met:  01/16/15 Tolerates up walking in room well. Goal: Initial discharge plan identified Outcome: Completed/Met Date Met:  01/16/15 Vital signs WNL PIH labs trending down Diuresing well Weight decreasing DTR's WNL Goal: Voiding-avoid urinary catheter unless indicated Outcome: Completed/Met Date Met:  01/16/15 Voiding moderate amounts of amber urine. Goal: Hemodynamically stable Outcome: Completed/Met Date Met:  01/16/15 Blood pressures stable on Magnesium drip. Goal: Other Phase I Outcomes/Goals Outcome: Completed/Met Date Met:  01/16/15 Blood pressures stable on Magnesium drip.  Problem: Phase II Progression Outcomes Goal: Progress activity as tolerated unless otherwise ordered Outcome: Completed/Met Date Met:  01/16/15 Tolerates sitting up and walking in room very well. Goal: Vital signs remain stable Outcome: Completed/Met Date Met:  01/16/15 Blood pressure stable on Magnesium drip. Goal: Other Phase II Outcomes/Goals Outcome: Progressing Maintain good urine output.

## 2015-01-17 LAB — COMPREHENSIVE METABOLIC PANEL
ALK PHOS: 105 U/L (ref 39–117)
ALT: 169 U/L — ABNORMAL HIGH (ref 0–35)
ALT: 155 U/L — AB (ref 0–35)
ANION GAP: 5 (ref 5–15)
ANION GAP: 9 (ref 5–15)
AST: 82 U/L — ABNORMAL HIGH (ref 0–37)
AST: 65 U/L — ABNORMAL HIGH (ref 0–37)
Albumin: 2.7 g/dL — ABNORMAL LOW (ref 3.5–5.2)
Albumin: 2.8 g/dL — ABNORMAL LOW (ref 3.5–5.2)
Alkaline Phosphatase: 103 U/L (ref 39–117)
BUN: 7 mg/dL (ref 6–23)
BUN: 6 mg/dL (ref 6–23)
CALCIUM: 7.4 mg/dL — AB (ref 8.4–10.5)
CO2: 24 mmol/L (ref 19–32)
CO2: 24 mmol/L (ref 19–32)
Calcium: 7.4 mg/dL — ABNORMAL LOW (ref 8.4–10.5)
Chloride: 108 mmol/L (ref 96–112)
Chloride: 106 mmol/L (ref 96–112)
Creatinine, Ser: 0.53 mg/dL (ref 0.50–1.10)
Creatinine, Ser: 0.6 mg/dL (ref 0.50–1.10)
GFR calc non Af Amer: >90 mL/min (ref >90)
GFR calc non Af Amer: >90 mL/min (ref >90)
GLUCOSE: 90 mg/dL (ref 70–99)
GLUCOSE: 107 mg/dL — AB (ref 70–99)
Potassium: 3.6 mmol/L (ref 3.5–5.1)
Potassium: 3.3 mmol/L — ABNORMAL LOW (ref 3.5–5.1)
SODIUM: 139 mmol/L (ref 135–145)
Sodium: 137 mmol/L (ref 135–145)
Total Bilirubin: 0.3 mg/dL (ref 0.3–1.2)
Total Bilirubin: 0.4 mg/dL (ref 0.3–1.2)
Total Protein: 5.6 g/dL — ABNORMAL LOW (ref 6.0–8.3)
Total Protein: 6.1 g/dL (ref 6.0–8.3)

## 2015-01-17 LAB — CBC
HCT: 30.4 % — ABNORMAL LOW (ref 36.0–46.0)
Hemoglobin: 10.7 g/dL — ABNORMAL LOW (ref 12.0–15.0)
MCH: 27.5 pg (ref 26.0–34.0)
MCHC: 35.2 g/dL (ref 30.0–36.0)
MCV: 78.1 fL (ref 78.0–100.0)
PLATELETS: 298 10*3/uL (ref 150–400)
RBC: 3.89 MIL/uL (ref 3.87–5.11)
RDW: 14.3 % (ref 11.5–15.5)
WBC: 6.2 10*3/uL (ref 4.0–10.5)

## 2015-01-17 LAB — URIC ACID: URIC ACID, SERUM: 6.5 mg/dL (ref 2.4–7.0)

## 2015-01-17 MED ORDER — SODIUM CHLORIDE 0.9 % IJ SOLN: 3.0000 mL | INTRAMUSCULAR | Status: DC | PRN

## 2015-01-17 MED ORDER — SODIUM CHLORIDE 0.9 % IJ SOLN
3.0000 mL | Freq: Two times a day (BID) | INTRAMUSCULAR | Status: DC
  Administered 2015-01-17: 3 mL via INTRAVENOUS

## 2015-01-17 NOTE — Lactation Note (Signed)
Lactation Consultation Note  Patient Name: Tiffany Hall [id number] Date: 01/17/2015   Mom a readmission in [place]. Mom called for assistance with latching baby. Mom reports that she has been pumping and giving EBM with bottle. Assisted mom to latch baby to left breast in cross-cradle position. Mom states that football is difficult at home, so demonstrated a new position. Mom states this position is more comfortable. Baby cueing to nurse, but fussy with each attempt at breast. Mom gave baby drops of EBM on lips, and baby did latch for 2-3 minutes and suckling rhythmically with intermittent swallows noted. However, baby pulled off mom's breast and was fussy. Discussed with mom that baby is used to receiving bottles and she will need to keep attempting at breast. Discussed giving baby lots of STS and attempts at breast whether cueing or not. Enc attempting long before baby actively hungry and cueing to nurse.   Discussed milk supply with mom. Mom states that she usually expresses 3 ounces every 2 hours. Enc mom to put baby to breast first at each feeding, then use EBM. FOB prefers to supplement with bottle. Parents aware of OP/BFSG and [place] phone line assistance after D/C.  Maternal Data    Feeding    [facility] Score/Interventions                      Lactation Tools Discussed/Used     Consult Status      Tiffany Hall 01/17/2015, 12:06 PM

## 2015-01-17 NOTE — Discharge Summary (Signed)
Obstetric Discharge Summary Reason for Admission: pp pre-eclampsia Prenatal Procedures: none Intrapartum Procedures: n/a Postpartum Procedures: n/a Complications-Operative and Postpartum: P.P. eclampsia HEMOGLOBIN  Date Value Ref Range Status  01/17/2015 10.7* 12.0 - 15.0 g/dL Final   HCT  Date Value Ref Range Status  01/17/2015 30.4* 36.0 - 46.0 % Final    Physical Exam:  General: alert, cooperative and appears stated age Lochia: appropriate Uterine Fundus: firm Incision: n/a DVT Evaluation: No evidence of DVT seen on physical exam. Negative Homan's sign. No cords or calf tenderness.  Discharge Diagnoses: Preelampsia  Discharge Information: Date: 01/17/2015 Activity: pelvic rest Diet: routine Medications: PNV, Ibuprofen and Percocet Condition: stable Instructions: refer to practice specific booklet Discharge to: home   Newborn Data: This patient has no babies on file. Home with mother.  Tiffany Hall, [place] 01/17/2015, 8:51 AM

## 2015-01-17 NOTE — Discharge Instructions (Signed)
Call MD for T>100.4, heavy vaginal bleeding, severe abdominal or chest pain.  Call office to schedule labs check appt on Wednesday.

## 2015-01-17 NOTE — Progress Notes (Signed)
No current c/o.  No HA, CP/SOB, RUQ pain, or vision change.  All admission complaints have resolved and patient is ready to go home.  Mag was d/c'd at 8 am.    AST 123->82, ALT 176->169  VSS.  BP wnl x 24 hours.   Gen: NAD Abd: NT Ext: no c/c/e, 2+ DTRs  28yo PPD#7 with readmission for pp pre-eclampsia -s/p magnesium sulfate -Labs normalizing, will check CMP this AM -Plan to d/c home with office lab visit on Wednesday  Tiffany Delarocha [place], DO

## 2015-01-18 NOTE — Progress Notes (Signed)
Post discharge chart review completed.  

## 2015-02-21 ENCOUNTER — Other Ambulatory Visit: Payer: Self-pay | Admitting: Obstetrics & Gynecology

## 2015-02-22 LAB — CYTOLOGY - PAP

## 2015-03-25 ENCOUNTER — Encounter (HOSPITAL_BASED_OUTPATIENT_CLINIC_OR_DEPARTMENT_OTHER): Payer: Self-pay | Admitting: *Deleted

## 2015-03-25 NOTE — Progress Notes (Signed)
To [facility] at 0600-T&S,urine pregnancy,Hg on arrival-instructed Npo after Mn-

## 2015-03-28 NOTE — H&P (Signed)
Tiffany Hall is an 29 y.o. female with desire for permanent sterilization.    Pertinent Gynecological History: Menses: flow is moderate Bleeding: normal Contraception: oral progesterone-only contraceptive DES exposure: unknown Blood transfusions: hx after D&C in 2009 Sexually transmitted diseases: past history: Trich, GC/D Previous GYN Procedures: DNC  Last mammogram: n/a Date: n/a Last pap: normal Date: 2016 OB History: G6, P5   Menstrual History: Menarche age: n/a Patient's last menstrual period was 02/26/2015.    Past Medical History  Diagnosis Date  . Migraine headache   . Ovarian cyst   . Sickle cell trait   . Gallstones 2012  . Chlamydia 2008  . Trichomonas 2008  . Gonorrhea 2008  . Heart murmur     as child  . Abnormal pap 2009    Colpo - unknown results  . Dental caries   . Blood transfusion without reported diagnosis     after D&E 2009  . Depression     age 4  . Shortness of breath dyspnea     with exertion  . GERD (gastroesophageal reflux disease)     medication not needed    Past Surgical History  Procedure Laterality Date  . Colposcopy    . Dilation and curettage of uterus      required blood transfusion    Family History  Problem Relation Age of Onset  . Diabetes Mother   . Hypertension Mother   . Hepatitis Mother   . Cancer Mother 68    Breast, cervical  . Diabetes Maternal Grandmother   . Hypertension Maternal Grandmother   . Cancer Maternal Grandmother 40    Breast  . Anesthesia problems Neg Hx   . Other Neg Hx     Social History:  reports that she quit smoking about 4 years ago. Her smoking use included Cigarettes. She has never used smokeless tobacco. She reports that she does not drink alcohol or use illicit drugs.  Allergies:  Allergies  Allergen Reactions  . Nubain [Nalbuphine Hcl] Swelling and Palpitations    Pt says she can take percocet without any problems    No prescriptions prior to admission    ROS  Height  5\' 4"  (1.626 m), weight 162 lb (73.483 kg), last menstrual period 02/26/2015, currently breastfeeding. Physical Exam  Constitutional: She is oriented to person, place, and time. She appears well-developed and well-nourished.  GI: Soft. There is no rebound and no guarding.  Neurological: She is alert and oriented to person, place, and time.  Skin: Skin is warm and dry.  Psychiatric: She has a normal mood and affect. Her behavior is normal.    No results found for this or any previous visit (from the past 24 hour(s)).  No results found.  Assessment/Plan: 29 yo G6P5 with desire for permanent sterilization -L/S BTL -Patient counseled re: risk of bleeding, infection, scarring and damage to surrounding structures.  Patient understands risk of failure and ectopic as well as risk of regret.  All questions were answered and the patient wishes to proceed.    Janie Strothman, [place] 03/28/2015, 10:01 AM

## 2015-04-26 ENCOUNTER — Encounter (HOSPITAL_BASED_OUTPATIENT_CLINIC_OR_DEPARTMENT_OTHER): Payer: Self-pay | Admitting: *Deleted

## 2015-04-26 NOTE — Progress Notes (Signed)
NPO AFTER MN.  ARRIVE AT 0600.  NEEDS HG AND URINE PREG.  

## 2015-04-29 ENCOUNTER — Ambulatory Visit (HOSPITAL_BASED_OUTPATIENT_CLINIC_OR_DEPARTMENT_OTHER)
Admission: RE | Admit: 2015-04-29 | Payer: Medicare Other | Source: Ambulatory Visit | Admitting: Obstetrics & Gynecology

## 2015-04-29 HISTORY — DX: Personal history of other diseases of the digestive system: Z87.19

## 2015-04-29 HISTORY — DX: Gastro-esophageal reflux disease without esophagitis: K21.9

## 2015-04-29 HISTORY — DX: Personal history of other infectious and parasitic diseases: Z86.19

## 2015-04-29 HISTORY — DX: Personal history of other specified conditions: Z87.898

## 2015-04-29 HISTORY — DX: Personal history of other diseases of the female genital tract: Z87.42

## 2015-04-29 SURGERY — LIGATION, FALLOPIAN TUBE, LAPAROSCOPIC
Anesthesia: General | Laterality: Bilateral

## 2015-07-29 ENCOUNTER — Encounter (HOSPITAL_COMMUNITY): Payer: Self-pay | Admitting: *Deleted

## 2015-07-29 ENCOUNTER — Inpatient Hospital Stay (HOSPITAL_COMMUNITY)
Admission: AD | Admit: 2015-07-29 | Discharge: 2015-07-29 | Discharge status: Against Medical Advice | Payer: Medicare Other | Source: Ambulatory Visit | Attending: Obstetrics and Gynecology | Admitting: Obstetrics and Gynecology

## 2015-07-29 DIAGNOSIS — Z3200 Encounter for pregnancy test, result unknown: Secondary | ICD-10-CM | POA: Diagnosis present

## 2015-07-29 DIAGNOSIS — Z3201 Encounter for pregnancy test, result positive: Secondary | ICD-10-CM | POA: Diagnosis not present

## 2015-07-29 LAB — URINALYSIS, ROUTINE W REFLEX MICROSCOPIC
Bilirubin Urine: NEGATIVE
Glucose, UA: NEGATIVE mg/dL
HGB URINE DIPSTICK: NEGATIVE
Ketones, ur: NEGATIVE mg/dL
Leukocytes, UA: NEGATIVE
NITRITE: NEGATIVE
PH: 7 (ref 5.0–8.0)
Protein, ur: NEGATIVE mg/dL
Specific Gravity, Urine: 1.02 (ref 1.005–1.030)

## 2015-07-29 LAB — POCT PREGNANCY, URINE: PREG TEST UR: POSITIVE — AB

## 2015-07-29 NOTE — MAU Note (Signed)
Pt unable to stay had to pick up husband. Stated she would come back later tonight . Informed her pregnancy test was positive.

## 2015-07-29 NOTE — MAU Note (Signed)
Pt reports she has been having abd pain and cramping off and on x2 weeks and has been feeling dizzy. Went to urgent care 2 weeks ago. Given "dizzy pills" and the check to see if she was anemic and they said it was fine.

## 2015-07-30 ENCOUNTER — Inpatient Hospital Stay (HOSPITAL_COMMUNITY): Payer: Medicare Other

## 2015-07-30 ENCOUNTER — Inpatient Hospital Stay (HOSPITAL_COMMUNITY)
Admission: AD | Admit: 2015-07-30 | Discharge: 2015-07-30 | Disposition: A | Discharge status: Home / Self Care | Payer: Medicare Other | Source: Ambulatory Visit | Attending: Obstetrics and Gynecology | Admitting: Obstetrics and Gynecology

## 2015-07-30 ENCOUNTER — Encounter (HOSPITAL_COMMUNITY): Payer: Self-pay | Admitting: *Deleted

## 2015-07-30 DIAGNOSIS — Z885 Allergy status to narcotic agent status: Secondary | ICD-10-CM | POA: Diagnosis not present

## 2015-07-30 DIAGNOSIS — O26891 Other specified pregnancy related conditions, first trimester: Secondary | ICD-10-CM | POA: Insufficient documentation

## 2015-07-30 DIAGNOSIS — D573 Sickle-cell trait: Secondary | ICD-10-CM | POA: Diagnosis not present

## 2015-07-30 DIAGNOSIS — O9989 Other specified diseases and conditions complicating pregnancy, childbirth and the puerperium: Secondary | ICD-10-CM | POA: Diagnosis not present

## 2015-07-30 DIAGNOSIS — O26899 Other specified pregnancy related conditions, unspecified trimester: Secondary | ICD-10-CM

## 2015-07-30 DIAGNOSIS — Z87891 Personal history of nicotine dependence: Secondary | ICD-10-CM | POA: Insufficient documentation

## 2015-07-30 DIAGNOSIS — Z3A01 Less than 8 weeks gestation of pregnancy: Secondary | ICD-10-CM | POA: Diagnosis not present

## 2015-07-30 DIAGNOSIS — O3680X0 Pregnancy with inconclusive fetal viability, not applicable or unspecified: Secondary | ICD-10-CM

## 2015-07-30 DIAGNOSIS — R109 Unspecified abdominal pain: Secondary | ICD-10-CM | POA: Diagnosis not present

## 2015-07-30 LAB — CBC
HCT: 30.2 % — ABNORMAL LOW (ref 36.0–46.0)
Hemoglobin: 10.4 g/dL — ABNORMAL LOW (ref 12.0–15.0)
MCH: 26.1 pg (ref 26.0–34.0)
MCHC: 34.4 g/dL (ref 30.0–36.0)
MCV: 75.9 fL — ABNORMAL LOW (ref 78.0–100.0)
PLATELETS: 355 10*3/uL (ref 150–400)
RBC: 3.98 MIL/uL (ref 3.87–5.11)
RDW: 14 % (ref 11.5–15.5)
WBC: 6.7 10*3/uL (ref 4.0–10.5)

## 2015-07-30 LAB — WET PREP, GENITAL
TRICH WET PREP: NONE SEEN
Yeast Wet Prep HPF POC: NONE SEEN

## 2015-07-30 LAB — HCG, QUANTITATIVE, PREGNANCY: hCG, Beta Chain, Quant, S: 5210 m[IU]/mL — ABNORMAL HIGH (ref <5)

## 2015-07-30 LAB — ABO/RH: ABO/RH(D): O POS

## 2015-07-30 NOTE — MAU Provider Note (Signed)
History     CSN: [id number]  Arrival date and time: 07/30/15 1704   First Provider Initiated Contact with Patient 07/30/15 1739         Chief Complaint  Patient presents with  . Abdominal Pain   HPI Tiffany Hall is a 29 y.o. [id number] at [redacted]w[redacted]d who presents with abdominal pain Intermittent lower abdominal cramping x 2 weeks. Has not treated. Rates 7/10. Denies vaginal bleeding. Thin white vaginal discharge. Denies odor or irritation.  Denies n/v/d/constipation.  Denies fever or urinary complaints.   Delivered with [facility] 6 months ago.    OB History    Gravida Para Term Preterm AB TAB SAB Ectopic Multiple Living   7 5 5  0 1 0 1 0 0 5      Past Medical History  Diagnosis Date  . Migraine headache   . Sickle cell trait ([place])   . Shortness of breath dyspnea     with exertion  . History of gallstones     2012  . History of chlamydia     2008  . History of gonorrhea     2008  . History of trichomonal vaginitis     2008  . History of abnormal cervical Pap smear     2009  . History of cardiac murmur as a child   . GERD (gastroesophageal reflux disease)     medication not needed  . History of ovarian cyst     Past Surgical History  Procedure Laterality Date  . Colposcopy    . Dilation and curettage of uterus  2009    W/  SUCTION --  required blood transfusion    Family History  Problem Relation Age of Onset  . Diabetes Mother   . Hypertension Mother   . Hepatitis Mother   . Cancer Mother 37    Breast, cervical  . Diabetes Maternal Grandmother   . Hypertension Maternal Grandmother   . Cancer Maternal Grandmother 40    Breast  . Anesthesia problems Neg Hx   . Other Neg Hx     Social History  Substance Use Topics  . Smoking status: Former Smoker    Types: Cigarettes    Quit date: 11/05/2010  . Smokeless tobacco: Never Used     Comment: quit with preg  . Alcohol Use: No     Comment: occ    Allergies:  Allergies  Allergen  Reactions  . Nubain [Nalbuphine Hcl] Swelling, Palpitations and Other (See Comments)    Pt states that she is able to take Percocet without any problems.       Prescriptions prior to admission  Medication Sig Dispense Refill Last Dose  . ferrous sulfate 325 (65 FE) MG tablet Take 325 mg by mouth daily.   07/29/2015 at Unknown time  . naproxen (NAPROSYN) 500 MG tablet Take 500 mg by mouth 2 (two) times daily as needed for headache.   Past Month at Unknown time    Review of Systems  Constitutional: Negative.   Gastrointestinal: Positive for abdominal pain. Negative for nausea, vomiting, diarrhea and constipation.  Genitourinary: Negative.    Physical Exam   Blood pressure 126/78, pulse 78, resp. rate 18, last menstrual period 06/27/2015, not currently breastfeeding.  Physical Exam  Nursing note and vitals reviewed. Constitutional: She is oriented to person, place, and time. She appears well-developed and well-nourished. No distress.  HENT:  Head: Normocephalic and atraumatic.  Eyes: Conjunctivae are normal. Right eye exhibits no discharge. Left eye  exhibits no discharge. No scleral icterus.  Neck: Normal range of motion.  Cardiovascular: Normal rate, regular rhythm and normal heart sounds.   No murmur heard. Respiratory: Effort normal and breath sounds normal. No respiratory distress. She has no wheezes.  GI: Soft. Bowel sounds are normal. She exhibits no distension. There is no tenderness.  Genitourinary: Vagina normal and uterus normal. Cervix exhibits discharge (small amount of thin white discharge). Cervix exhibits no motion tenderness and no friability. Right adnexum displays no mass, no tenderness and no fullness. Left adnexum displays no mass, no tenderness and no fullness.  Cervix closed  Neurological: She is alert and oriented to person, place, and time.  Skin: Skin is warm and dry. She is not diaphoretic.  Psychiatric: She has a normal mood and affect. Her behavior is  normal. Judgment and thought content normal.    MAU Course  Procedures Results for orders placed or performed during the hospital encounter of 07/30/15 (from the past 24 hour(s))  Wet prep, genital     Status: Abnormal   Collection Time: 07/30/15  5:10 PM  Result Value Ref Range   Yeast Wet Prep HPF POC NONE SEEN NONE SEEN   Trich, Wet Prep NONE SEEN NONE SEEN   Clue Cells Wet Prep HPF POC FEW (A) NONE SEEN   WBC, Wet Prep HPF POC FEW (A) NONE SEEN  CBC     Status: Abnormal   Collection Time: 07/30/15  5:20 PM  Result Value Ref Range   WBC 6.7 4.0 - 10.5 K/uL   RBC 3.98 3.87 - 5.11 MIL/uL   Hemoglobin 10.4 (L) 12.0 - 15.0 g/dL   HCT 30.2 (L) 36.0 - 46.0 %   MCV 75.9 (L) 78.0 - 100.0 fL   MCH 26.1 26.0 - 34.0 pg   MCHC 34.4 30.0 - 36.0 g/dL   RDW 14.0 11.5 - 15.5 %   Platelets 355 150 - 400 K/uL  ABO/Rh     Status: None   Collection Time: 07/30/15  5:20 PM  Result Value Ref Range   ABO/RH(D) O POS   hCG, quantitative, pregnancy     Status: Abnormal   Collection Time: 07/30/15  5:20 PM  Result Value Ref Range   hCG, Beta Chain, Quant, S 5210 (H) <5 mIU/mL   US Ob Comp Less 14 Wks  07/30/2015  CLINICAL DATA:  Pregnant patient with abdominal pain. Intermittent cramping. EXAM: OBSTETRIC <14 WK [place] AND TRANSVAGINAL OB US TECHNIQUE: Both transabdominal and transvaginal ultrasound examinations were performed for complete evaluation of the gestation as well as the maternal uterus, adnexal regions, and pelvic cul-de-sac. Transvaginal technique was performed to assess early pregnancy. COMPARISON:  None. FINDINGS: Intrauterine gestational sac: Visualized. Small in size. Normal in shape. Adjacent endometrial fluid. Yolk sac:  Small yolk sac tentatively identified. Embryo:  Not present. Cardiac Activity: Not present. MSD: 5.3  mm   5 w   2  d Maternal uterus/adnexae: Small amounts of fluid in the endometrial canal adjacent to the a gestational sac but definite subchorionic hemorrhage. Left ovary  measures 3.3 x 1.7 x 2.0 cm and appears normal. The right ovary measures 2.3 x 1.3 x 1.4 cm and appears normal. There is no pelvic free fluid. IMPRESSION: Probable early intrauterine gestational sac and questionable small yolk sac, but no fetal pole or cardiac activity yet visualized. Small amount of fluid in the endometrial canal adjacent to the gestational sac, of uncertain significance. Recommend follow-up quantitative B-HCG levels and follow-up US in 14  days to confirm and assess viability. This recommendation follows SRU consensus guidelines: Diagnostic Criteria for Nonviable Pregnancy Early in the First Trimester. [place] Med 2013; 062:3762-83. Electronically Signed   By: Jeb Levering M.D.   On: 07/30/2015 18:37   US Ob Transvaginal  07/30/2015  CLINICAL DATA:  Pregnant patient with abdominal pain. Intermittent cramping. EXAM: OBSTETRIC <14 WK [place] AND TRANSVAGINAL OB US TECHNIQUE: Both transabdominal and transvaginal ultrasound examinations were performed for complete evaluation of the gestation as well as the maternal uterus, adnexal regions, and pelvic cul-de-sac. Transvaginal technique was performed to assess early pregnancy. COMPARISON:  None. FINDINGS: Intrauterine gestational sac: Visualized. Small in size. Normal in shape. Adjacent endometrial fluid. Yolk sac:  Small yolk sac tentatively identified. Embryo:  Not present. Cardiac Activity: Not present. MSD: 5.3  mm   5 w   2  d Maternal uterus/adnexae: Small amounts of fluid in the endometrial canal adjacent to the a gestational sac but definite subchorionic hemorrhage. Left ovary measures 3.3 x 1.7 x 2.0 cm and appears normal. The right ovary measures 2.3 x 1.3 x 1.4 cm and appears normal. There is no pelvic free fluid. IMPRESSION: Probable early intrauterine gestational sac and questionable small yolk sac, but no fetal pole or cardiac activity yet visualized. Small amount of fluid in the endometrial canal adjacent to the gestational sac, of  uncertain significance. Recommend follow-up quantitative B-HCG levels and follow-up US in 14 days to confirm and assess viability. This recommendation follows SRU consensus guidelines: Diagnostic Criteria for Nonviable Pregnancy Early in the First Trimester. [place] Med 2013; 151:7616-07. Electronically Signed   By: Jeb Levering M.D.   On: 07/30/2015 18:37    MDM O positive Ultrasound showed IUGS with "questionable small yolk sac" with recommendation for BHCG follow up  Assessment and Plan  A: 1. Abdominal pain in pregnancy   2. Pregnancy of unknown anatomic location    P: Discharge home F/u in 48 hrs for BHCG Discussed reasons to return to [place], NP   07/30/2015, 5:39 PM

## 2015-07-30 NOTE — Discharge Instructions (Signed)

## 2015-07-30 NOTE — MAU Note (Signed)
Pt here yesterday for abd pain and had to leave. Was told she had a positive HPT. Came back to day to finish her evaluation.

## 2015-07-31 LAB — HIV ANTIBODY (ROUTINE TESTING W REFLEX): HIV Screen 4th Generation wRfx: NONREACTIVE

## 2015-08-01 ENCOUNTER — Inpatient Hospital Stay (HOSPITAL_COMMUNITY)
Admission: EM | Admit: 2015-08-01 | Discharge: 2015-08-02 | Disposition: A | Discharge status: Home / Self Care | Payer: Medicare Other | Source: Ambulatory Visit | Attending: Family Medicine | Admitting: Family Medicine

## 2015-08-01 DIAGNOSIS — Z885 Allergy status to narcotic agent status: Secondary | ICD-10-CM | POA: Insufficient documentation

## 2015-08-01 DIAGNOSIS — O3680X0 Pregnancy with inconclusive fetal viability, not applicable or unspecified: Secondary | ICD-10-CM

## 2015-08-01 DIAGNOSIS — Z3A01 Less than 8 weeks gestation of pregnancy: Secondary | ICD-10-CM | POA: Insufficient documentation

## 2015-08-01 DIAGNOSIS — Z87891 Personal history of nicotine dependence: Secondary | ICD-10-CM | POA: Insufficient documentation

## 2015-08-01 DIAGNOSIS — O26891 Other specified pregnancy related conditions, first trimester: Secondary | ICD-10-CM | POA: Diagnosis present

## 2015-08-01 DIAGNOSIS — O3680X1 Pregnancy with inconclusive fetal viability, fetus 1: Secondary | ICD-10-CM | POA: Diagnosis not present

## 2015-08-01 DIAGNOSIS — E349 Endocrine disorder, unspecified: Secondary | ICD-10-CM

## 2015-08-01 LAB — GC/CHLAMYDIA PROBE AMP (~~LOC~~) NOT AT ARMC
CHLAMYDIA, DNA PROBE: NEGATIVE
NEISSERIA GONORRHEA: NEGATIVE

## 2015-08-01 NOTE — MAU Note (Signed)
Pt here for follow up labs, denies bleeding or pain.

## 2015-08-02 DIAGNOSIS — O26891 Other specified pregnancy related conditions, first trimester: Secondary | ICD-10-CM | POA: Diagnosis not present

## 2015-08-02 DIAGNOSIS — O3680X1 Pregnancy with inconclusive fetal viability, fetus 1: Secondary | ICD-10-CM

## 2015-08-02 LAB — HCG, QUANTITATIVE, PREGNANCY: hCG, Beta Chain, Quant, S: 10352 m[IU]/mL — ABNORMAL HIGH (ref <5)

## 2015-08-02 NOTE — Discharge Instructions (Signed)

## 2015-08-02 NOTE — MAU Provider Note (Signed)
History     CSN: [id number]  Arrival date and time: 08/01/15 2302   None     No chief complaint on file.  HPI  Tiffany Hall is a 29 y.o. [id number] at [redacted]w[redacted]d who presents today for FU HCG. She denies any abdominal pain or vaginal bleeding since her last visit here. She has no other concerns at this time. She states that the lower abdominal cramping that was present tow days ago has stopped. Current pain 0/10. She states that she has not taken anything for the pain. It resolved spontaneously. No aggravating or alleviating factors. She denies any vaginal discharge. She denies any vaginal bleeding.   Past Medical History  Diagnosis Date  . Migraine headache   . Sickle cell trait ([place])   . Shortness of breath dyspnea     with exertion  . History of gallstones     2012  . History of chlamydia     2008  . History of gonorrhea     2008  . History of trichomonal vaginitis     2008  . History of abnormal cervical Pap smear     2009  . History of cardiac murmur as a child   . GERD (gastroesophageal reflux disease)     medication not needed  . History of ovarian cyst     Past Surgical History  Procedure Laterality Date  . Colposcopy    . Dilation and curettage of uterus  2009    W/  SUCTION --  required blood transfusion    Family History  Problem Relation Age of Onset  . Diabetes Mother   . Hypertension Mother   . Hepatitis Mother   . Cancer Mother 60    Breast, cervical  . Diabetes Maternal Grandmother   . Hypertension Maternal Grandmother   . Cancer Maternal Grandmother 40    Breast  . Anesthesia problems Neg Hx   . Other Neg Hx     Social History  Substance Use Topics  . Smoking status: Former Smoker    Types: Cigarettes    Quit date: 11/05/2010  . Smokeless tobacco: Never Used     Comment: quit with preg  . Alcohol Use: No     Comment: occ    Allergies:  Allergies  Allergen Reactions  . Nubain [Nalbuphine Hcl] Swelling, Palpitations and Other (See  Comments)    Pt states that she is able to take Percocet without any problems.       Prescriptions prior to admission  Medication Sig Dispense Refill Last Dose  . ferrous sulfate 325 (65 FE) MG tablet Take 325 mg by mouth daily.   07/29/2015 at Unknown time    Review of Systems  Constitutional: Negative for fever.  Gastrointestinal: Negative for nausea, vomiting, abdominal pain, diarrhea and constipation.  Genitourinary: Negative for dysuria, urgency and frequency.   Physical Exam   Blood pressure 100/62, pulse 83, temperature 98.6 F (37 C), temperature source Oral, resp. rate 16, last menstrual period 06/27/2015, SpO2 100 %, not currently breastfeeding.  Physical Exam  Nursing note and vitals reviewed. Constitutional: She is oriented to person, place, and time. She appears well-developed and well-nourished. No distress.  HENT:  Head: Normocephalic.  Cardiovascular: Normal rate.   Respiratory: Effort normal.  GI: Soft. There is no tenderness.  Neurological: She is alert and oriented to person, place, and time.  Skin: Skin is warm and dry.   Results for Tiffany, Hall (MRN [id number]) as of 08/02/2015 00:13  Ref.  Range 07/30/2015 17:20 07/30/2015 18:30 08/01/2015 23:10  HCG, Beta Chain, Quant, S Latest Ref Range: <5 mIU/mL 5210 (H)  10352 (H)   MAU Course  Procedures  MDM   Assessment and Plan   1. Pregnancy of unknown anatomic location   2. Elevated serum hCG    DC home Comfort measures reviewed  1st Trimester precautions  Bleeding precautions Ectopic precautions RX: none  Return to MAU as needed FU [place] on 08/05/15       Mathis Bud 08/02/2015, 12:14 AM

## 2015-08-05 ENCOUNTER — Inpatient Hospital Stay (HOSPITAL_COMMUNITY)
Admission: AD | Admit: 2015-08-05 | Discharge: 2015-08-05 | Disposition: A | Discharge status: Home / Self Care | Payer: Medicare Other | Source: Ambulatory Visit | Attending: Obstetrics and Gynecology | Admitting: Obstetrics and Gynecology

## 2015-08-05 ENCOUNTER — Ambulatory Visit (HOSPITAL_COMMUNITY)
Admission: RE | Admit: 2015-08-05 | Discharge: 2015-08-05 | Disposition: A | Discharge status: Home / Self Care | Payer: Medicare Other | Source: Ambulatory Visit | Attending: Advanced Practice Midwife | Admitting: Advanced Practice Midwife

## 2015-08-05 DIAGNOSIS — R1030 Lower abdominal pain, unspecified: Secondary | ICD-10-CM | POA: Diagnosis not present

## 2015-08-05 DIAGNOSIS — O3680X0 Pregnancy with inconclusive fetal viability, not applicable or unspecified: Secondary | ICD-10-CM

## 2015-08-05 DIAGNOSIS — Z36 Encounter for antenatal screening of mother: Secondary | ICD-10-CM | POA: Insufficient documentation

## 2015-08-05 DIAGNOSIS — Z3A01 Less than 8 weeks gestation of pregnancy: Secondary | ICD-10-CM | POA: Insufficient documentation

## 2015-08-05 DIAGNOSIS — O200 Threatened abortion: Secondary | ICD-10-CM

## 2015-08-05 DIAGNOSIS — E349 Endocrine disorder, unspecified: Secondary | ICD-10-CM

## 2015-08-05 NOTE — Discharge Instructions (Signed)
Threatened Miscarriage A threatened miscarriage is when you have vaginal bleeding during your first 20 weeks of pregnancy but the pregnancy has not ended. Your doctor will do tests to make sure you are still pregnant. The cause of the bleeding may not be known. This condition does not mean your pregnancy will end. It does increase the risk of it ending (complete miscarriage). HOME CARE   Make sure you keep all your doctor visits for prenatal care.  Get plenty of rest.  Do not have sex or use tampons if you have vaginal bleeding.  Do not douche.  Do not smoke or use drugs.  Do not drink alcohol.  Avoid caffeine. GET HELP IF:  You have light bleeding from your vagina.  You have belly pain or cramping.  You have a fever. GET HELP RIGHT AWAY IF:   You have heavy bleeding from your vagina.  You have clots of blood coming from your vagina.  You have bad pain or cramps in your low back or belly.  You have fever, chills, and bad belly pain. MAKE SURE YOU:   Understand these instructions.  Will watch your condition.  Will get help right away if you are not doing well or get worse.   This information is not intended to replace advice given to you by your health care provider. Make sure you discuss any questions you have with your health care provider.   Document Released: 09/06/2008 Document Revised: 09/29/2013 Document Reviewed: 07/21/2013 Elsevier Interactive Patient Education 2016 Else[employer] Abdominal Pain During Pregnancy Belly (abdominal) pain is common during pregnancy. Most of the time, it is not a serious problem. Other times, it can be a sign that something is wrong with the pregnancy. Always tell your doctor if you have belly pain. HOME CARE Monitor your belly pain for any changes. The following actions may help you feel better:  Do not have sex (intercourse) or put anything in your vagina until you feel better.  Rest until your pain stops.  Drink clear  fluids if you feel sick to your stomach (nauseous). Do not eat solid food until you feel better.  Only take medicine as told by your doctor.  Keep all doctor visits as told. GET HELP RIGHT AWAY IF:   You are bleeding, leaking fluid, or pieces of tissue come out of your vagina.  You have more pain or cramping.  You keep throwing up (vomiting).  You have pain when you pee (urinate) or have blood in your pee.  You have a fever.  You do not feel your baby moving as much.  You feel very weak or feel like passing out.  You have trouble breathing, with or without belly pain.  You have a very bad headache and belly pain.  You have fluid leaking from your vagina and belly pain.  You keep having watery poop (diarrhea).  Your belly pain does not go away after resting, or the pain gets worse. MAKE SURE YOU:   Understand these instructions.  Will watch your condition.  Will get help right away if you are not doing well or get worse.   This information is not intended to replace advice given to you by your health care provider. Make sure you discuss any questions you have with your health care provider.   Document Released: 09/12/2009 Document Revised: 05/27/2013 Document Reviewed: 04/23/2013 Elsevier Interactive Patient Education [employer].

## 2015-08-05 NOTE — MAU Provider Note (Signed)
History     CSN: [id number]  Arrival date and time: 08/05/15 1351   None     No chief complaint on file.  HPI  Tiffany Hall is 29 y.o. [id number] [redacted]w[redacted]d weeks presenting after U/S follow up. Patient of Dr. Gaetano Net.  Initially seen 07/30/15 with intermittent lower abdominal cramping without bleeding.  On that date BHCG 5210 and U/S showed IUP ? Small yolk sac.  She returned on 10/24 for BHCG- 10,352.  Today she denies pain and bleeding.     Past Medical History  Diagnosis Date  . Migraine headache   . Sickle cell trait ([place])   . Shortness of breath dyspnea     with exertion  . History of gallstones     2012  . History of chlamydia     2008  . History of gonorrhea     2008  . History of trichomonal vaginitis     2008  . History of abnormal cervical Pap smear     2009  . History of cardiac murmur as a child   . GERD (gastroesophageal reflux disease)     medication not needed  . History of ovarian cyst     Past Surgical History  Procedure Laterality Date  . Colposcopy    . Dilation and curettage of uterus  2009    W/  SUCTION --  required blood transfusion    Family History  Problem Relation Age of Onset  . Diabetes Mother   . Hypertension Mother   . Hepatitis Mother   . Cancer Mother 52    Breast, cervical  . Diabetes Maternal Grandmother   . Hypertension Maternal Grandmother   . Cancer Maternal Grandmother 40    Breast  . Anesthesia problems Neg Hx   . Other Neg Hx     Social History  Substance Use Topics  . Smoking status: Former Smoker    Types: Cigarettes    Quit date: 11/05/2010  . Smokeless tobacco: Never Used     Comment: quit with preg  . Alcohol Use: No     Comment: occ    Allergies:  Allergies  Allergen Reactions  . Nubain [Nalbuphine Hcl] Swelling, Palpitations and Other (See Comments)    Pt states that she is able to take Percocet without any problems.       Prescriptions prior to admission  Medication Sig Dispense Refill Last  Dose  . ferrous sulfate 325 (65 FE) MG tablet Take 325 mg by mouth daily.   07/29/2015 at Unknown time    Review of Systems  Gastrointestinal: Negative for abdominal pain.  Genitourinary:       Neg for vaginal bleeding   Physical Exam   Last menstrual period 06/27/2015, not currently breastfeeding.  Physical Exam  Constitutional: She is oriented to person, place, and time. She appears well-developed and well-nourished. No distress.  Neurological: She is alert and oriented to person, place, and time.  Psychiatric: She has a normal mood and affect. Her behavior is normal.        CLINICAL DATA: Assess for viability.  EXAM: TRANSVAGINAL OB ULTRASOUND  TECHNIQUE: Transvaginal ultrasound was performed for complete evaluation of the gestation as well as the maternal uterus, adnexal regions, and pelvic cul-de-sac.  COMPARISON: Pelvic ultrasound 07/30/2015  FINDINGS: Intrauterine gestational sac: Visualized/normal in shape.  Yolk sac: Present  Embryo: Not present  Cardiac Activity: Not present  MSD: 13.7 mm 6 w 2 d  Maternal uterus/adnexae: Normal left ovary. Right ovary is  not visualized. No subchorionic hemorrhage. No free fluid in the pelvis.  IMPRESSION: Intrauterine gestational sac and yolk sac. No definite fetal pole or cardiac activity. Recommend follow-up quantitative B-HCG levels and follow-up US in 14 days to confirm and assess viability. This recommendation follows SRU consensus guidelines: Diagnostic Criteria for Nonviable Pregnancy Early in the First Trimester. [employer] Med 2013; 903:8333-83.   Electronically Signed  By: Lovey Newcomer M.D.  On: 08/05/2015 13:53            MAU Course  Procedures  MDM Discussed MSE, U/S results with Dr. Julien Girt.  Order given to have patient follow up with Dr. Gaetano Net in the office next week/  Assessment and Plan  A:  Follow up Ultrasound for abdominal pain in 1st trimester pregnancy       IUGS/YS without cardiac activity-threatened miscarriage vs early pregnancy  P:  Consulted with Dr. Julien Girt       F/U with Dr. Gaetano Net next week in office       Threatened Miscarriage precautions discussed with patient.   KEY,EVE M 08/05/2015, 2:53 PM

## 2015-08-08 ENCOUNTER — Ambulatory Visit (HOSPITAL_COMMUNITY): Payer: Medicare Other

## 2015-09-07 ENCOUNTER — Encounter: Payer: Medicare Other | Admitting: Obstetrics

## 2015-09-12 ENCOUNTER — Other Ambulatory Visit: Payer: Self-pay | Admitting: Certified Nurse Midwife

## 2015-09-13 ENCOUNTER — Encounter (HOSPITAL_COMMUNITY): Payer: Self-pay | Admitting: *Deleted

## 2015-09-13 NOTE — H&P (Signed)
Tiffany Hall is an 29 y.o. female with missed ab measuring 8-9 weeks.  Blood type O pos.  Desires surgical management.  Pertinent Gynecological History: Menses: n/a Bleeding: n/a Contraception: pregnancy DES exposure: unknown Blood transfusions: none Sexually transmitted diseases: past history: GC, Chlamydia, Trich Previous GYN Procedures: DNC  Last mammogram: n/a Date: n/a Last pap: normal Date: 2016 OB History: G7, P5   Menstrual History: Menarche age: n/a Patient's last menstrual period was 06/27/2015 (exact date).    Past Medical History  Diagnosis Date  . Migraine headache   . Sickle cell trait ([place])   . History of gallstones     2012  . History of chlamydia     2008  . History of gonorrhea     2008  . History of trichomonal vaginitis     2008  . History of abnormal cervical Pap smear     2009  . History of cardiac murmur as a child   . GERD (gastroesophageal reflux disease)     medication not needed  . History of ovarian cyst   . Heart murmur   . Hypertension     PIH with pregnancy  . Anxiety   . Depression   . Anemia     Past Surgical History  Procedure Laterality Date  . Colposcopy    . Dilation and curettage of uterus  2009    W/  SUCTION --  required blood transfusion  . Dilatation and currettage  N/A     Family History  Problem Relation Age of Onset  . Diabetes Mother   . Hypertension Mother   . Hepatitis Mother   . Cancer Mother 63    Breast, cervical  . Diabetes Maternal Grandmother   . Hypertension Maternal Grandmother   . Cancer Maternal Grandmother 40    Breast  . Anesthesia problems Neg Hx   . Other Neg Hx     Social History:  reports that she quit smoking about 4 years ago. Her smoking use included Cigarettes. She smoked 0.00 packs per day. She has never used smokeless tobacco. She reports that she does not drink alcohol or use illicit drugs.  Allergies:  Allergies  Allergen Reactions  . Nubain [Nalbuphine Hcl] Swelling,  Palpitations and Other (See Comments)    Pt states that she is able to take Percocet without any problems.       No prescriptions prior to admission    ROS  Height 5' 4.5" (1.638 m), weight 73.483 kg (162 lb), last menstrual period 06/27/2015, unknown if currently breastfeeding. Physical Exam  Constitutional: She is oriented to person, place, and time. She appears well-developed and well-nourished.  Cardiovascular: Normal rate and regular rhythm.   Respiratory: Effort normal and breath sounds normal. She has no wheezes. She has no rales.  GI: Soft. There is no tenderness. There is no rebound and no guarding.  Neurological: She is alert and oriented to person, place, and time.  Skin: Skin is warm and dry.  Psychiatric: She has a normal mood and affect. Her behavior is normal.    No results found for this or any previous visit (from the past 24 hour(s)).  No results found.  Assessment/Plan: 29yo G7P5 with missed abortion -Patient is counseled for suciton D&C including risk of bleeding, infection, scarring and damage to surrounding structures.  All questions were answered and the patient wishes to proceed. -Suction D&C  Tiffany Hall 09/13/2015, 9:57 PM

## 2015-09-14 ENCOUNTER — Ambulatory Visit (HOSPITAL_COMMUNITY): Admission: RE | Admit: 2015-09-14 | Payer: Medicare Other | Source: Ambulatory Visit

## 2015-09-14 ENCOUNTER — Encounter (HOSPITAL_COMMUNITY): Payer: Self-pay | Admitting: *Deleted

## 2015-09-14 ENCOUNTER — Ambulatory Visit (HOSPITAL_COMMUNITY): Payer: Medicare Other | Admitting: Anesthesiology

## 2015-09-14 ENCOUNTER — Ambulatory Visit (HOSPITAL_COMMUNITY)
Admission: RE | Admit: 2015-09-14 | Discharge: 2015-09-14 | Disposition: A | Discharge status: Home / Self Care | Payer: Medicare Other | Source: Ambulatory Visit | Attending: Obstetrics & Gynecology | Admitting: Obstetrics & Gynecology

## 2015-09-14 ENCOUNTER — Encounter (HOSPITAL_COMMUNITY)
Admission: RE | Disposition: A | Discharge status: Home / Self Care | Payer: Self-pay | Source: Ambulatory Visit | Attending: Obstetrics & Gynecology

## 2015-09-14 DIAGNOSIS — O021 Missed abortion: Secondary | ICD-10-CM | POA: Diagnosis not present

## 2015-09-14 DIAGNOSIS — Z87891 Personal history of nicotine dependence: Secondary | ICD-10-CM | POA: Insufficient documentation

## 2015-09-14 DIAGNOSIS — Z3A09 9 weeks gestation of pregnancy: Secondary | ICD-10-CM | POA: Insufficient documentation

## 2015-09-14 DIAGNOSIS — Z419 Encounter for procedure for purposes other than remedying health state, unspecified: Secondary | ICD-10-CM

## 2015-09-14 HISTORY — PX: DILATION AND EVACUATION: SHX1459

## 2015-09-14 HISTORY — DX: Anxiety disorder, unspecified: F41.9

## 2015-09-14 HISTORY — DX: Essential (primary) hypertension: I10

## 2015-09-14 LAB — CBC
HCT: 33.1 % — ABNORMAL LOW (ref 36.0–46.0)
HEMOGLOBIN: 11.6 g/dL — AB (ref 12.0–15.0)
MCH: 26.5 pg (ref 26.0–34.0)
MCHC: 35 g/dL (ref 30.0–36.0)
MCV: 75.7 fL — ABNORMAL LOW (ref 78.0–100.0)
PLATELETS: 384 10*3/uL (ref 150–400)
RBC: 4.37 MIL/uL (ref 3.87–5.11)
RDW: 14.2 % (ref 11.5–15.5)
WBC: 7 10*3/uL (ref 4.0–10.5)

## 2015-09-14 SURGERY — DILATION AND EVACUATION, UTERUS
Anesthesia: Monitor Anesthesia Care | Site: Vagina

## 2015-09-14 MED ORDER — DOXYCYCLINE HYCLATE 100 MG PO TABS
ORAL_TABLET | ORAL | Status: AC
  Filled 2015-09-14: qty 2

## 2015-09-14 MED ORDER — KETOROLAC TROMETHAMINE 30 MG/ML IJ SOLN
INTRAMUSCULAR | Status: AC
  Filled 2015-09-14: qty 1

## 2015-09-14 MED ORDER — IBUPROFEN 800 MG PO TABS: 800.0000 mg | ORAL_TABLET | Freq: Three times a day (TID) | ORAL | Status: DC | PRN

## 2015-09-14 MED ORDER — DEXAMETHASONE SODIUM PHOSPHATE 10 MG/ML IJ SOLN
INTRAMUSCULAR | Status: DC | PRN
  Administered 2015-09-14: 4 mg via INTRAVENOUS

## 2015-09-14 MED ORDER — FENTANYL CITRATE (PF) 100 MCG/2ML IJ SOLN
INTRAMUSCULAR | Status: DC | PRN
  Administered 2015-09-14: 50 ug via INTRAVENOUS
  Administered 2015-09-14: 100 ug via INTRAVENOUS

## 2015-09-14 MED ORDER — DOXYCYCLINE HYCLATE 100 MG PO TABS
200.0000 mg | ORAL_TABLET | Freq: Once | ORAL | Status: AC
  Administered 2015-09-14: 200 mg via ORAL

## 2015-09-14 MED ORDER — PROPOFOL 10 MG/ML IV BOLUS
INTRAVENOUS | Status: AC
  Filled 2015-09-14: qty 20

## 2015-09-14 MED ORDER — SCOPOLAMINE 1 MG/3DAYS TD PT72
MEDICATED_PATCH | TRANSDERMAL | Status: AC
  Administered 2015-09-14: 1.5 mg via TRANSDERMAL
  Filled 2015-09-14: qty 1

## 2015-09-14 MED ORDER — OXYCODONE-ACETAMINOPHEN 5-325 MG PO TABS
1.0000 | ORAL_TABLET | ORAL | Status: DC | PRN
  Administered 2015-09-14: 1 via ORAL

## 2015-09-14 MED ORDER — OXYCODONE-ACETAMINOPHEN 5-325 MG PO TABS
ORAL_TABLET | ORAL | Status: AC
  Filled 2015-09-14: qty 1

## 2015-09-14 MED ORDER — PROPOFOL 10 MG/ML IV BOLUS
INTRAVENOUS | Status: DC | PRN
  Administered 2015-09-14 (×3): 30 mg via INTRAVENOUS

## 2015-09-14 MED ORDER — FENTANYL CITRATE (PF) 100 MCG/2ML IJ SOLN: 25.0000 ug | INTRAMUSCULAR | Status: DC | PRN

## 2015-09-14 MED ORDER — LIDOCAINE HCL (CARDIAC) 20 MG/ML IV SOLN
INTRAVENOUS | Status: DC | PRN
  Administered 2015-09-14: 50 mg via INTRAVENOUS

## 2015-09-14 MED ORDER — LACTATED RINGERS IV SOLN: INTRAVENOUS | Status: DC

## 2015-09-14 MED ORDER — SCOPOLAMINE 1 MG/3DAYS TD PT72
1.0000 | MEDICATED_PATCH | Freq: Once | TRANSDERMAL | Status: DC
  Administered 2015-09-14: 1.5 mg via TRANSDERMAL

## 2015-09-14 MED ORDER — DOXYCYCLINE HYCLATE 100 MG IV SOLR
100.0000 mg | Freq: Once | INTRAVENOUS | Status: AC
  Administered 2015-09-14: 100 mg via INTRAVENOUS
  Filled 2015-09-14: qty 100

## 2015-09-14 MED ORDER — METOCLOPRAMIDE HCL 5 MG/ML IJ SOLN
INTRAMUSCULAR | Status: AC
  Filled 2015-09-14: qty 2

## 2015-09-14 MED ORDER — DIPHENHYDRAMINE HCL 50 MG/ML IJ SOLN
INTRAMUSCULAR | Status: AC
  Filled 2015-09-14: qty 1

## 2015-09-14 MED ORDER — DEXAMETHASONE SODIUM PHOSPHATE 4 MG/ML IJ SOLN
INTRAMUSCULAR | Status: AC
  Filled 2015-09-14: qty 1

## 2015-09-14 MED ORDER — CHLOROPROCAINE HCL 1 % IJ SOLN
INTRAMUSCULAR | Status: DC | PRN
  Administered 2015-09-14: 10 mL

## 2015-09-14 MED ORDER — MEPERIDINE HCL 25 MG/ML IJ SOLN: 6.2500 mg | INTRAMUSCULAR | Status: DC | PRN

## 2015-09-14 MED ORDER — CHLOROPROCAINE HCL 1 % IJ SOLN
INTRAMUSCULAR | Status: AC
  Filled 2015-09-14: qty 30

## 2015-09-14 MED ORDER — PROMETHAZINE HCL 25 MG/ML IJ SOLN: 6.2500 mg | INTRAMUSCULAR | Status: DC | PRN

## 2015-09-14 MED ORDER — LIDOCAINE HCL (CARDIAC) 20 MG/ML IV SOLN
INTRAVENOUS | Status: AC
  Filled 2015-09-14: qty 5

## 2015-09-14 MED ORDER — LACTATED RINGERS IV SOLN
INTRAVENOUS | Status: DC
  Administered 2015-09-14 (×2): via INTRAVENOUS

## 2015-09-14 MED ORDER — FENTANYL CITRATE (PF) 250 MCG/5ML IJ SOLN
INTRAMUSCULAR | Status: AC
  Filled 2015-09-14: qty 5

## 2015-09-14 MED ORDER — ONDANSETRON HCL 4 MG/2ML IJ SOLN
INTRAMUSCULAR | Status: AC
  Filled 2015-09-14: qty 2

## 2015-09-14 MED ORDER — OXYCODONE-ACETAMINOPHEN 5-325 MG PO TABS: 1.0000 | ORAL_TABLET | ORAL | Status: DC | PRN

## 2015-09-14 MED ORDER — KETOROLAC TROMETHAMINE 30 MG/ML IJ SOLN
INTRAMUSCULAR | Status: DC | PRN
  Administered 2015-09-14: 30 mg via INTRAVENOUS

## 2015-09-14 MED ORDER — MIDAZOLAM HCL 2 MG/2ML IJ SOLN
INTRAMUSCULAR | Status: AC
  Filled 2015-09-14: qty 2

## 2015-09-14 MED ORDER — ONDANSETRON HCL 4 MG/2ML IJ SOLN
INTRAMUSCULAR | Status: DC | PRN
  Administered 2015-09-14: 4 mg via INTRAVENOUS

## 2015-09-14 MED ORDER — MIDAZOLAM HCL 2 MG/2ML IJ SOLN
INTRAMUSCULAR | Status: DC | PRN
  Administered 2015-09-14: 2 mg via INTRAVENOUS

## 2015-09-14 SURGICAL SUPPLY — 20 items
CATH ROBINSON RED A/P 16FR (CATHETERS) ×3 IMPLANT
CLOTH BEACON ORANGE TIMEOUT ST (SAFETY) ×3 IMPLANT
DECANTER SPIKE VIAL GLASS SM (MISCELLANEOUS) ×3 IMPLANT
GLOVE BIO SURGEON STRL SZ 6 (GLOVE) ×3 IMPLANT
GLOVE BIOGEL PI IND STRL 6 (GLOVE) ×2 IMPLANT
GLOVE BIOGEL PI IND STRL 7.0 (GLOVE) ×1 IMPLANT
GLOVE BIOGEL PI INDICATOR 6 (GLOVE) ×4
GLOVE BIOGEL PI INDICATOR 7.0 (GLOVE) ×2
GOWN STRL REUS W/TWL LRG LVL3 (GOWN DISPOSABLE) ×6 IMPLANT
KIT BERKELEY 1ST TRIMESTER 3/8 (MISCELLANEOUS) ×3 IMPLANT
NS IRRIG 1000ML POUR BTL (IV SOLUTION) ×3 IMPLANT
PACK VAGINAL MINOR WOMEN LF (CUSTOM PROCEDURE TRAY) ×3 IMPLANT
PAD OB MATERNITY 4.3X12.25 (PERSONAL CARE ITEMS) ×3 IMPLANT
PAD PREP 24X48 CUFFED NSTRL (MISCELLANEOUS) ×3 IMPLANT
SET BERKELEY SUCTION TUBING (SUCTIONS) ×3 IMPLANT
TOWEL OR 17X24 6PK STRL BLUE (TOWEL DISPOSABLE) ×6 IMPLANT
VACURETTE 10 RIGID CVD (CANNULA) IMPLANT
VACURETTE 7MM CVD STRL WRAP (CANNULA) IMPLANT
VACURETTE 8 RIGID CVD (CANNULA) IMPLANT
VACURETTE 9 RIGID CVD (CANNULA) ×3 IMPLANT

## 2015-09-14 NOTE — Progress Notes (Signed)
Patient requested repeat u/s for viability.  U.S was done and no FCA.  No change to H&P.  Linda Hedges, DO

## 2015-09-14 NOTE — Transfer of Care (Signed)
Immediate Anesthesia Transfer of Care Note  Patient: Tiffany Hall  Procedure(s) Performed: Procedure(s): DILATATION AND EVACUATION (N/A)  Patient Location: PACU  Anesthesia Type:MAC  Level of Consciousness: sedated  Airway & Oxygen Therapy: Patient Spontanous Breathing  Post-op Assessment: Report given to RN  Post vital signs: Reviewed and stable  Last Vitals:  Filed Vitals:   09/14/15 1207 09/14/15 1221  BP:  117/76  Pulse: 82   Temp: 37.1 C   Resp: 20     Complications: No apparent anesthesia complications

## 2015-09-14 NOTE — Anesthesia Postprocedure Evaluation (Signed)
Anesthesia Post Note  Patient: Tiffany Hall  Procedure(s) Performed: Procedure(s) (LRB): DILATATION AND EVACUATION (N/A)  Patient location during evaluation: PACU Anesthesia Type: MAC Level of consciousness: awake and alert Pain management: pain level controlled Vital Signs Assessment: post-procedure vital signs reviewed and stable Respiratory status: spontaneous breathing, nonlabored ventilation, respiratory function stable and patient connected to nasal cannula oxygen Cardiovascular status: stable and blood pressure returned to baseline Anesthetic complications: no    Last Vitals:  Filed Vitals:   09/14/15 1221 09/14/15 1330  BP: 117/76 117/80  Pulse:  65  Temp:  36.9 C  Resp:  14    Last Pain:  Filed Vitals:   09/14/15 1340  PainSc: [place]ke

## 2015-09-14 NOTE — Op Note (Signed)
PREOPERATIVE DIAGNOSIS: 29 y.o. with missed abortion measuring 9 weeks  POSTOPERATIVE DIAGNOSIS: The same  PROCEDURE: Suction Dilation and Curettage.  SURGEON: Dr. Linda Hedges  INDICATIONS: 29 y.o. [id number] here for scheduled surgery for treatment of missed abortion Risks of surgery were discussed with the patient including but not limited to: bleeding which may require transfusion; infection which may require antibiotics; injury to uterus or surrounding organs; intrauterine scarring which may impair future fertility; need for additional procedures including laparotomy or laparoscopy; and other postoperative/anesthesia complications. Written informed consent was obtained.   FINDINGS: A 10 week size uterus.   ANESTHESIA: Conscious sedation, paracervical block  ESTIMATED BLOOD LOSS: 50cc  SPECIMENS: POC sent to pathology  COMPLICATIONS: None immediate.  PROCEDURE DETAILS: The patient received intravenous antibiotics while in the preoperative area. She was then taken to the operating room where conscious sedation was administered and was found to be adequate. After an adequate timeout was performed, she was placed in the dorsal lithotomy position and examined; then prepped and draped in the sterile manner. Her bladder was catheterized for an unmeasured amount of clear, yellow urine. A speculum was then placed in the patient's vagina and a single tooth tenaculum was applied to the anterior lip of the cervix. A paracervical block using 10 ml of 0.25% nesacaine was administered. The uteus was sounded to 10 cm and dilated manually with metal dilators to accommodate the 9 mm suction curette. Once the cervix was dilated, the suction curette was advanced without difficulty. A total of four passes were taken with tissue returned. A sharp curette was used to feel for gritty texture circumferentially which was present. A final pass with the suction curette was performed to remove  blood. The tenaculum was removed from the anterior lip of the cervix and the vaginal speculum was removed after noting good hemostasis. The patient tolerated the procedure well and was taken to the recovery area awake, extubated and in stable condition.

## 2015-09-14 NOTE — Anesthesia Preprocedure Evaluation (Addendum)
Anesthesia Evaluation  Patient identified by MRN, date of birth, ID band Patient awake    Reviewed: Allergy & Precautions, NPO status , Patient's Chart, lab work & pertinent test results  Airway Mallampati: II  TM Distance: >3 FB Neck ROM: Full    Dental  (+) Teeth Intact   Pulmonary former smoker,    breath sounds clear to auscultation       Cardiovascular hypertension,  Rhythm:Regular     Neuro/Psych  Headaches, PSYCHIATRIC DISORDERS Anxiety Depression    GI/Hepatic Neg liver ROS, GERD  ,  Endo/Other  negative endocrine ROS  Renal/GU negative Renal ROS  negative genitourinary   Musculoskeletal negative musculoskeletal ROS (+)   Abdominal   Peds negative pediatric ROS (+)  Hematology   Anesthesia Other Findings   Reproductive/Obstetrics                           Lab Results  Component Value Date   WBC 6.7 07/30/2015   HGB 10.4* 07/30/2015   HCT 30.2* 07/30/2015   MCV 75.9* 07/30/2015   PLT 355 07/30/2015   Lab Results  Component Value Date   CREATININE 0.60 01/17/2015   BUN 6 01/17/2015   NA 139 01/17/2015   K 3.3* 01/17/2015   CL 106 01/17/2015   CO2 24 01/17/2015    Anesthesia Physical Anesthesia Plan  ASA: II  Anesthesia Plan: MAC   Post-op Pain Management:    Induction: Intravenous  Airway Management Planned: Natural Airway and Nasal Cannula  Additional Equipment:   Intra-op Plan:   Post-operative Plan:   Informed Consent: I have reviewed the patients History and Physical, chart, labs and discussed the procedure including the risks, benefits and alternatives for the proposed anesthesia with the patient or authorized representative who has indicated his/her understanding and acceptance.     Plan Discussed with: CRNA  Anesthesia Plan Comments:        Anesthesia Quick Evaluation

## 2015-09-14 NOTE — Discharge Instructions (Signed)
Call MD for T>100.4, heavy vaginal bleeding, severe abdominal pain, intractable nausea and/or vomiting, or respiratory distress.  Call office to schedule postop appt in 2 weeks.  No driving while taking narcotics.  Pelvic rest x 4 weeks.DISCHARGE INSTRUCTIONS: D&C / D&E The following instructions have been prepared to help you care for yourself upon your return home.   Personal hygiene:  Use sanitary pads for vaginal drainage, not tampons.  Shower the day after your procedure.  NO tub baths, pools or Jacuzzis for 2-3 weeks.  Wipe front to back after using the bathroom.  Activity and limitations:  Do NOT drive or operate any equipment for 24 hours. The effects of anesthesia are still present and drowsiness may result.  Do NOT rest in bed all day.  Walking is encouraged.  Walk up and down stairs slowly.  You may resume your normal activity in one to two days or as indicated by your physician.  Sexual activity: NO intercourse for at least 2 weeks after the procedure, or as indicated by your physician.  Diet: Eat a light meal as desired this evening. You may resume your usual diet tomorrow.  Return to work: You may resume your work activities in one to two days or as indicated by your doctor.  What to expect after your surgery: Expect to have vaginal bleeding/discharge for 2-3 days and spotting for up to 10 days. It is not unusual to have soreness for up to 1-2 weeks. You may have a slight burning sensation when you urinate for the first day. Mild cramps may continue for a couple of days. You may have a regular period in 2-6 weeks.  Call your doctor for any of the following:  Excessive vaginal bleeding, saturating and changing one pad every hour.  Inability to urinate 6 hours after discharge from hospital.  Pain not relieved by pain medication.  Fever of 100.4 F or greater.  Unusual vaginal discharge or odor.   Call for an appointment:    Patients signature:  ______________________  Nurses signature ________________________  Support person's signature_______________________

## 2015-09-15 ENCOUNTER — Encounter (HOSPITAL_COMMUNITY): Payer: Self-pay | Admitting: Obstetrics & Gynecology

## 2015-10-13 ENCOUNTER — Other Ambulatory Visit: Payer: Self-pay | Admitting: Obstetrics & Gynecology

## 2015-10-13 DIAGNOSIS — E049 Nontoxic goiter, unspecified: Secondary | ICD-10-CM

## 2015-10-14 ENCOUNTER — Ambulatory Visit
Admission: RE | Admit: 2015-10-14 | Discharge: 2015-10-14 | Disposition: A | Discharge status: Home / Self Care | Payer: Medicare Other | Source: Ambulatory Visit | Attending: Obstetrics & Gynecology | Admitting: Obstetrics & Gynecology

## 2015-10-14 DIAGNOSIS — E049 Nontoxic goiter, unspecified: Secondary | ICD-10-CM

## 2015-12-15 ENCOUNTER — Ambulatory Visit (HOSPITAL_COMMUNITY)
Admission: RE | Admit: 2015-12-15 | Payer: Medicare Other | Source: Ambulatory Visit | Admitting: Obstetrics & Gynecology

## 2015-12-15 ENCOUNTER — Encounter (HOSPITAL_COMMUNITY): Admission: RE | Payer: Self-pay | Source: Ambulatory Visit

## 2015-12-15 SURGERY — LIGATION, FALLOPIAN TUBE, LAPAROSCOPIC
Anesthesia: General | Laterality: Bilateral

## 2016-04-16 ENCOUNTER — Encounter: Payer: Self-pay | Admitting: Cardiology

## 2016-04-16 ENCOUNTER — Ambulatory Visit (INDEPENDENT_AMBULATORY_CARE_PROVIDER_SITE_OTHER): Payer: Medicare Other | Admitting: Cardiology

## 2016-04-16 VITALS — BP 122/78 | HR 61 | Ht 64.0 in | Wt 169.0 lb

## 2016-04-16 DIAGNOSIS — R002 Palpitations: Secondary | ICD-10-CM

## 2016-04-16 NOTE — Patient Instructions (Signed)
Medication Instructions:  Your physician recommends that you continue on your current medications as directed. Please refer to the Current Medication list given to you today.  Labwork: None ordered  Testing/Procedures: Your physician has recommended that you wear a 48 hour holter monitor. Holter monitors are medical devices that record the heart's electrical activity. Doctors most often use these monitors to diagnose arrhythmias. Arrhythmias are problems with the speed or rhythm of the heartbeat. The monitor is a small, portable device. You can wear one while you do your normal daily activities. This is usually used to diagnose what is causing palpitations/syncope (passing out).  Follow-Up: To be determined once holter monitor has been reviewed by the physician.  We will call you with these results.   Thank you for choosing [facility]!!   Trinidad Curet, RN [phone]  Any Other Special Instructions Will Be Listed Below (If Applicable).  Holter Monitoring A Holter monitor is a small device that is used to detect abnormal heart rhythms. It clips to your clothing and is connected by wires to flat, sticky disks (electrodes) that attach to your chest. It is worn continuously for 24-48 hours. HOME CARE INSTRUCTIONS  Wear your Holter monitor at all times, even while exercising and sleeping, for as long as directed by your health care provider.  Make sure that the Holter monitor is safely clipped to your clothing or close to your body as recommended by your health care provider.  Do not get the monitor or wires wet.  Do not put body lotion or moisturizer on your chest.  Keep your skin clean.  Keep a diary of your daily activities, such as walking and doing chores. If you feel that your heartbeat is abnormal or that your heart is fluttering or skipping a beat:  Record what you are doing when it happens.  Record what time of day the symptoms occur.  Return your Holter monitor as  directed by your health care provider.  Keep all follow-up visits as directed by your health care provider. This is important. SEEK IMMEDIATE MEDICAL CARE IF:  You feel lightheaded or you faint.  You have trouble breathing.  You feel pain in your chest, upper arm, or jaw.  You feel sick to your stomach and your skin is pale, cool, or damp.  You heartbeat feels unusual or abnormal.   This information is not intended to replace advice given to you by your health care provider. Make sure you discuss any questions you have with your health care provider.   Document Released: 06/22/2004 Document Revised: 10/15/2014 Document Reviewed: 05/03/2014 Elsevier Interactive Patient Education [employer].

## 2016-04-16 NOTE — Progress Notes (Signed)
Electrophysiology Office Note   Date:  04/16/2016   ID:  Remington Piekos, DOB [birth year], MRN [id number]  PCP:  Linda Hedges, DO  Primary Electrophysiologist:  Constance Haw, MD    Chief Complaint  Patient presents with  . New Patient (Initial Visit)  . Palpitations     History of Present Illness: Tiffany Hall is a 30 y.o. female who presents today for electrophysiology evaluation.   She presents to evaluate her palpitations. She says that she feels like her heart is skipping a beat and then has to race to get back up. Her palpitations occur roughly every day. She says that they last for minutes at a time. They occur mainly when she is up and walking around and rarely occur when she is sitting and relaxing. He says that they tend to just go away on their own. She has noted no other alleviating factors.   Today, she denies symptoms of chest pain, shortness of breath, orthopnea, PND, lower extremity edema, claudication, dizziness, presyncope, syncope, bleeding, or neurologic sequela. The patient is tolerating medications without difficulties and is otherwise without complaint today.    Past Medical History  Diagnosis Date  . Migraine headache   . Sickle cell trait ([place])   . History of gallstones     2012  . History of chlamydia     2008  . History of gonorrhea     2008  . History of trichomonal vaginitis     2008  . History of abnormal cervical Pap smear     2009  . History of cardiac murmur as a child   . GERD (gastroesophageal reflux disease)     medication not needed  . History of ovarian cyst   . Heart murmur   . Hypertension     PIH with pregnancy  . Anxiety   . Depression   . Anemia    Past Surgical History  Procedure Laterality Date  . Colposcopy    . Dilation and curettage of uterus  2009    W/  SUCTION --  required blood transfusion  . Dilatation and currettage  N/A   . Dilation and evacuation N/A 09/14/2015    Procedure: DILATATION AND  EVACUATION;  Surgeon: Linda Hedges, DO;  Location: [facility];  Service: Gynecology;  Laterality: N/A;     Current Outpatient Prescriptions  Medication Sig Dispense Refill  . acetaminophen (TYLENOL) 500 MG tablet Take 500 mg by mouth every 6 (six) hours as needed for headache.    . ferrous sulfate 325 (65 FE) MG tablet Take 325 mg by mouth daily with breakfast.    . ibuprofen (ADVIL,MOTRIN) 800 MG tablet Take 1 tablet (800 mg total) by mouth every 8 (eight) hours as needed. 30 tablet 0  . JUNEL FE 1/20 1-20 MG-MCG tablet Take 1 tablet by mouth daily.     No current facility-administered medications for this visit.    Allergies:   Nubain   Social History:  The patient  reports that she quit smoking about 5 years ago. Her smoking use included Cigarettes. She smoked 0.00 packs per day. She has never used smokeless tobacco. She reports that she does not drink alcohol or use illicit drugs.   Family History:  The patient's family history includes Cancer (age of onset: 67) in her mother; Cancer (age of onset: 81) in her maternal grandmother; Diabetes in her maternal grandmother and mother; Hepatitis in her mother; Hypertension in her maternal grandmother and mother. There is no  history of Anesthesia problems or Other.    ROS:  Please see the history of present illness.   Otherwise, review of systems is positive for palpitations, easy bruising.   All other systems are reviewed and negative.    PHYSICAL EXAM: VS:  BP 122/78 mmHg  Pulse 61  Ht 5\' 4"  (1.626 m)  Wt 169 lb (76.658 kg)  BMI 28.99 kg/m2  LMP 06/27/2015 , BMI Body mass index is 28.99 kg/(m^2). GEN: Well nourished, well developed, in no acute distress HEENT: normal Neck: no JVD, carotid bruits, or masses Cardiac: RRR; no murmurs, rubs, or gallops,no edema  Respiratory:  clear to auscultation bilaterally, normal work of breathing GI: soft, nontender, nondistended, + BS MS: no deformity or atrophy Skin: warm and dry Neuro:  Strength  and sensation are intact Psych: euthymic mood, full affect  EKG:  EKG is ordered today. The ekg ordered today shows sinus rhythm, rate 61  Recent Labs: 09/14/2015: Hemoglobin 11.6*; Platelets 384    Lipid Panel  No results found for: CHOL, TRIG, HDL, CHOLHDL, VLDL, LDLCALC, LDLDIRECT   Wt Readings from Last 3 Encounters:  04/16/16 169 lb (76.658 kg)  09/13/15 162 lb (73.483 kg)  07/29/15 159 lb 3.2 oz (72.213 kg)    ASSESSMENT AND PLAN:  1.  Palpitations:At this time, it is difficult to determine the cause of her palpitations. She has a normal EKG without evidence of vein accessory pathway. Due to her palpitations, and the frequency, we'll fit her with a 48 hour monitor to make a further determination. We Tiffany Hall have follow-up based on the monitor. Of note, she does not have a noticeable murmur today. It is likely that this is due to changes in cardiac filling pressures.    Current medicines are reviewed at length with the patient today.   The patient does not have concerns regarding her medicines.  The following changes were made today:  none  Labs/ tests ordered today include:  Orders Placed This Encounter  Procedures  . Holter monitor - 48 hour     Disposition:   FU with Tiffany Hall post monitor  Signed, Narciso Stoutenburg Meredith Leeds, MD  04/16/2016 3:51 PM     [place] [address] [place] [address] [phone] (office) [phone] (fax)

## 2016-04-17 NOTE — Addendum Note (Signed)
Addended by: Freada Bergeron on: 04/17/2016 05:02 PM   Modules accepted: Orders

## 2016-04-19 ENCOUNTER — Ambulatory Visit (INDEPENDENT_AMBULATORY_CARE_PROVIDER_SITE_OTHER): Payer: Medicare Other

## 2016-04-19 DIAGNOSIS — R002 Palpitations: Secondary | ICD-10-CM

## 2016-04-23 ENCOUNTER — Emergency Department (HOSPITAL_COMMUNITY): Payer: Medicare Other

## 2016-04-23 ENCOUNTER — Emergency Department (HOSPITAL_COMMUNITY)
Admission: EM | Admit: 2016-04-23 | Discharge: 2016-04-23 | Discharge status: Against Medical Advice | Payer: Medicare Other | Attending: Emergency Medicine | Admitting: Emergency Medicine

## 2016-04-23 ENCOUNTER — Encounter (HOSPITAL_COMMUNITY): Payer: Self-pay | Admitting: Emergency Medicine

## 2016-04-23 DIAGNOSIS — Z87891 Personal history of nicotine dependence: Secondary | ICD-10-CM | POA: Diagnosis not present

## 2016-04-23 DIAGNOSIS — R202 Paresthesia of skin: Secondary | ICD-10-CM | POA: Insufficient documentation

## 2016-04-23 DIAGNOSIS — M79601 Pain in right arm: Secondary | ICD-10-CM | POA: Insufficient documentation

## 2016-04-23 DIAGNOSIS — I10 Essential (primary) hypertension: Secondary | ICD-10-CM | POA: Diagnosis not present

## 2016-04-23 DIAGNOSIS — R0789 Other chest pain: Secondary | ICD-10-CM | POA: Diagnosis present

## 2016-04-23 DIAGNOSIS — Z79899 Other long term (current) drug therapy: Secondary | ICD-10-CM | POA: Insufficient documentation

## 2016-04-23 DIAGNOSIS — F329 Major depressive disorder, single episode, unspecified: Secondary | ICD-10-CM | POA: Insufficient documentation

## 2016-04-23 DIAGNOSIS — M79609 Pain in unspecified limb: Secondary | ICD-10-CM

## 2016-04-23 LAB — COMPREHENSIVE METABOLIC PANEL
ALT: 11 U/L — ABNORMAL LOW (ref 14–54)
AST: 12 U/L — ABNORMAL LOW (ref 15–41)
Albumin: 3.9 g/dL (ref 3.5–5.0)
Alkaline Phosphatase: 55 U/L (ref 38–126)
Anion gap: 5 (ref 5–15)
BUN: 13 mg/dL (ref 6–20)
CO2: 24 mmol/L (ref 22–32)
Calcium: 8.7 mg/dL — ABNORMAL LOW (ref 8.9–10.3)
Chloride: 107 mmol/L (ref 101–111)
Creatinine, Ser: 0.6 mg/dL (ref 0.44–1.00)
GFR calc Af Amer: >60 mL/min (ref >60)
GFR calc non Af Amer: >60 mL/min (ref >60)
Glucose, Bld: 92 mg/dL (ref 65–99)
Potassium: 4.1 mmol/L (ref 3.5–5.1)
Sodium: 136 mmol/L (ref 135–145)
Total Bilirubin: 0.7 mg/dL (ref 0.3–1.2)
Total Protein: 7.3 g/dL (ref 6.5–8.1)

## 2016-04-23 LAB — I-STAT TROPONIN, ED: Troponin i, poc: 0.01 ng/mL (ref 0.00–0.08)

## 2016-04-23 LAB — D-DIMER, QUANTITATIVE (NOT AT ARMC)

## 2016-04-23 LAB — CBC
HCT: 29.9 % — ABNORMAL LOW (ref 36.0–46.0)
Hemoglobin: 10.1 g/dL — ABNORMAL LOW (ref 12.0–15.0)
MCH: 25.4 pg — ABNORMAL LOW (ref 26.0–34.0)
MCHC: 33.8 g/dL (ref 30.0–36.0)
MCV: 75.1 fL — ABNORMAL LOW (ref 78.0–100.0)
Platelets: 514 10*3/uL — ABNORMAL HIGH (ref 150–400)
RBC: 3.98 MIL/uL (ref 3.87–5.11)
RDW: 15.3 % (ref 11.5–15.5)
WBC: 7.2 10*3/uL (ref 4.0–10.5)

## 2016-04-23 LAB — HCG, QUANTITATIVE, PREGNANCY: hCG, Beta Chain, Quant, S: <1 m[IU]/mL (ref <5)

## 2016-04-23 MED ORDER — SODIUM CHLORIDE 0.9 % IV BOLUS (SEPSIS): 1000.0000 mL | Freq: Once | INTRAVENOUS | Status: DC

## 2016-04-23 MED ORDER — IBUPROFEN 800 MG PO TABS
800.0000 mg | ORAL_TABLET | Freq: Once | ORAL | Status: DC
  Filled 2016-04-23: qty 1

## 2016-04-23 NOTE — ED Provider Notes (Signed)
CSN: [id number]     Arrival date & time 04/23/16  1910 History  By signing my name below, I, Tiffany Hall, attest that this documentation has been prepared under the direction and in the presence of Tiffany Loan, PA-C. Electronically Signed: Rayna Hall, ED Scribe. 04/23/2016. 8:59 PM.   Chief Complaint  Patient presents with  . Chest Pain   The history is provided by the patient. No language interpreter was used.    HPI Comments: Tiffany Hall is a 30 y.o. female with a PMHx of heart murmur and palpitations who presents to the Emergency Department complaining of constant, mild, RUE paraesthesias onset at 11:45 am after venous puncture in right antecubital fossa and while writing in class. Pt states that it began with diffuse RUE paraesthesias while in class and beginning at 3:00 PM she began experiencing intermittent, sharp, pinpoint centralized CP throughout the day as well as mild pain through the affected regions of her RUE. Pt states her paraesthesias are throughout her right forearm and move up to her right shoulder but denies any symptoms in her right hand or neck. She denies any exacerbating factors and states that drinking water helps. Pt denies a hx of smoking or a FMHx of sudden cardiac death. She reports a PMHx of heart murmur and palpitations and was last evaluated by her cardiologist Dr. Curt Bears on 04/16/2016.  She sent in the Holter monitor today. She is on oral birth control. Pt denies any recent surgeries or prolonged periods of being immobile. She denies SOB, neck numbness or tingling, facial numbness or tingling, HA, dizziness, lightheadedness, diaphoresis, n/v and leg swelling.   Past Medical History  Diagnosis Date  . Migraine headache   . Sickle cell trait ([place])   . History of gallstones     2012  . History of chlamydia     2008  . History of gonorrhea     2008  . History of trichomonal vaginitis     2008  . History of abnormal cervical Pap smear     2009  .  History of cardiac murmur as a child   . GERD (gastroesophageal reflux disease)     medication not needed  . History of ovarian cyst   . Heart murmur   . Hypertension     PIH with pregnancy  . Anxiety   . Depression   . Anemia    Past Surgical History  Procedure Laterality Date  . Colposcopy    . Dilation and curettage of uterus  2009    W/  SUCTION --  required blood transfusion  . Dilatation and currettage  N/A   . Dilation and evacuation N/A 09/14/2015    Procedure: DILATATION AND EVACUATION;  Surgeon: Linda Hedges, DO;  Location: [facility];  Service: Gynecology;  Laterality: N/A;   Family History  Problem Relation Age of Onset  . Diabetes Mother   . Hypertension Mother   . Hepatitis Mother   . Cancer Mother 45    Breast, cervical  . Diabetes Maternal Grandmother   . Hypertension Maternal Grandmother   . Cancer Maternal Grandmother 40    Breast  . Anesthesia problems Neg Hx   . Other Neg Hx    Social History  Substance Use Topics  . Smoking status: Former Smoker -- 0.00 packs/day    Types: Cigarettes    Quit date: 11/05/2010  . Smokeless tobacco: Never Used     Comment: quit with preg  . Alcohol Use: No  Comment: occ   OB History    Gravida Para Term Preterm AB TAB SAB Ectopic Multiple Living   7 5 5  0 1 0 1 0 0 5     Review of Systems  Constitutional: Negative for diaphoresis.  Respiratory: Negative for shortness of breath.   Cardiovascular: Positive for chest pain. Negative for leg swelling.  Gastrointestinal: Negative for nausea and vomiting.  Musculoskeletal: Positive for myalgias.  Neurological: Negative for dizziness, light-headedness and numbness.  All other systems reviewed and are negative.  Allergies  Nubain  Home Medications   Prior to Admission medications   Medication Sig Start Date End Date Taking? Authorizing Provider  cholecalciferol (VITAMIN D) 1000 units tablet Take 1,000 Units by mouth daily.   Yes Historical Provider, MD  ferrous  sulfate 325 (65 FE) MG tablet Take 325 mg by mouth daily with breakfast.   Yes Historical Provider, MD  JUNEL FE 1/20 1-20 MG-MCG tablet Take 1 tablet by mouth daily. 03/26/16  Yes Historical Provider, MD  ibuprofen (ADVIL,MOTRIN) 800 MG tablet Take 1 tablet (800 mg total) by mouth every 8 (eight) hours as needed. Patient not taking: Reported on 04/23/2016 09/14/15   Megan Morris, DO   BP 125/81 mmHg  Pulse 57  Temp(Src) 98.6 F (37 C) (Oral)  Resp 18  Ht 5\' 4"  (1.626 m)  Wt 75.751 kg  BMI 28.65 kg/m2  SpO2 100%  LMP 03/29/2016 (Approximate)    Physical Exam  Constitutional: She is oriented to person, place, and time. She appears well-developed and well-nourished.  Non-toxic appearance. She does not have a sickly appearance. She does not appear ill.  HENT:  Head: Normocephalic and atraumatic.  Mouth/Throat: Oropharynx is clear and moist.  Eyes: Conjunctivae are normal. Pupils are equal, round, and reactive to light.  Neck: Normal range of motion. Neck supple.  Cardiovascular: Normal rate, regular rhythm, normal heart sounds and intact distal pulses.   Pulses:      Radial pulses are 2+ on the right side, and 2+ on the left side.  No unilateral UE swelling. No color changes.   Pulmonary/Chest: Effort normal and breath sounds normal. No accessory muscle usage or stridor. No respiratory distress. She has no wheezes. She has no rhonchi. She has no rales.  Abdominal: Soft. Bowel sounds are normal. She exhibits no distension. There is no tenderness.  Musculoskeletal: Normal range of motion.  Lymphadenopathy:    She has no cervical adenopathy.  Neurological: She is alert and oriented to person, place, and time.  Speech clear without dysarthria. No facial droop.   5/5 strength in BUE. Sensation intact to pinprick and light touch, although patient states "it feels different on the right". No specific dermatomal distribution.  Same in medial, ulnar, and radial distributions.   Skin: Skin is  warm and dry.  Psychiatric: She has a normal mood and affect. Her behavior is normal.    ED Course  Procedures  DIAGNOSTIC STUDIES: Oxygen Saturation is 100% on RA, normal by my interpretation.    COORDINATION OF CARE: 8:57 PM Discussed next steps with pt. Pt verbalized understanding and is agreeable with the plan.   Labs Review Labs Reviewed  CBC - Abnormal; Notable for the following:    Hemoglobin 10.1 (*)    HCT 29.9 (*)    MCV 75.1 (*)    MCH 25.4 (*)    Platelets 514 (*)    All other components within normal limits  COMPREHENSIVE METABOLIC PANEL - Abnormal; Notable for the following:  Calcium 8.7 (*)    AST 12 (*)    ALT 11 (*)    All other components within normal limits  D-DIMER, QUANTITATIVE (NOT AT [facility])  HCG, QUANTITATIVE, PREGNANCY  I-STAT TROPOININ, ED  I-STAT BETA HCG BLOOD, ED ([facility], [facility], [facility] ONLY)  I-STAT TROPOININ, ED    Imaging Review Dg Chest 2 View  04/23/2016  CLINICAL DATA:  Acute right chest pain radiating into the right arm. EXAM: CHEST  2 VIEW COMPARISON:  01/15/2015 FINDINGS: The heart size and mediastinal contours are within normal limits. Both lungs are clear. The visualized skeletal structures are unremarkable. IMPRESSION: No active cardiopulmonary disease. Electronically Signed   By: Jerilynn Mages.  Shick M.D.   On: 04/23/2016 19:55   I have personally reviewed and evaluated these images and lab results as part of my medical decision-making.   EKG Interpretation   Date/Time:  Monday April 23 2016 19:28:37 EDT Ventricular Rate:  62 PR Interval:    QRS Duration: 94 QT Interval:  381 QTC Calculation: 387 R Axis:   69 Text Interpretation:  Sinus rhythm Confirmed by Wilson Singer  MD, STEPHEN ([id number])  on 04/23/2016 9:02:31 PM      MDM   Final diagnoses:  Paresthesia and pain of extremity  Atypical chest pain   Labs largely without acute abnormalities.  HEART score 0.  EKG NSR.  CXR normal.  Troponin negative.  Low suspicion for ACS.  Low risk using Well's  criteria, d-dimer negative. Doubt neurological etiology, not specific dermatome or nerve distribution.  Likely 2/2 to venous puncture in right AC.    Patient left AMA before I was able to discuss remaining lab work.  I personally performed the services described in this documentation, which was scribed in my presence. The recorded information has been reviewed and is accurate.    Tiffany Loan, PA-C 04/23/16 2219  Virgel Manifold, MD 04/25/16 [phone]

## 2016-04-23 NOTE — ED Notes (Signed)
Pt did not sign out AMA. She left without notifying any staff.

## 2016-04-23 NOTE — ED Notes (Signed)
Patient states she was in class today and felt her right arm get numbness and tingling. Patient states that after she left class the chest pain started.

## 2016-04-23 NOTE — ED Notes (Signed)
Pt left AMA, Declined further sticks

## 2016-04-23 NOTE — ED Notes (Addendum)
Pt refused IV and further blood draw. States that she has been stuck too many times. Pt given a cup of water to encourage fluids. PA made aware

## 2017-02-25 ENCOUNTER — Other Ambulatory Visit: Payer: Self-pay | Admitting: Nurse Practitioner

## 2017-02-25 DIAGNOSIS — N63 Unspecified lump in unspecified breast: Secondary | ICD-10-CM

## 2017-02-25 DIAGNOSIS — N644 Mastodynia: Secondary | ICD-10-CM

## 2017-03-01 ENCOUNTER — Other Ambulatory Visit: Payer: Medicare Other

## 2017-05-08 ENCOUNTER — Emergency Department (HOSPITAL_BASED_OUTPATIENT_CLINIC_OR_DEPARTMENT_OTHER)
Admission: EM | Admit: 2017-05-08 | Discharge: 2017-05-08 | Disposition: A | Discharge status: Home / Self Care | Payer: Medicare Other | Attending: Emergency Medicine | Admitting: Emergency Medicine

## 2017-05-08 ENCOUNTER — Encounter (HOSPITAL_BASED_OUTPATIENT_CLINIC_OR_DEPARTMENT_OTHER): Payer: Self-pay | Admitting: Emergency Medicine

## 2017-05-08 DIAGNOSIS — K0889 Other specified disorders of teeth and supporting structures: Secondary | ICD-10-CM | POA: Insufficient documentation

## 2017-05-08 DIAGNOSIS — F17210 Nicotine dependence, cigarettes, uncomplicated: Secondary | ICD-10-CM | POA: Diagnosis not present

## 2017-05-08 DIAGNOSIS — Z79899 Other long term (current) drug therapy: Secondary | ICD-10-CM | POA: Diagnosis not present

## 2017-05-08 DIAGNOSIS — T7840XA Allergy, unspecified, initial encounter: Secondary | ICD-10-CM | POA: Insufficient documentation

## 2017-05-08 DIAGNOSIS — I10 Essential (primary) hypertension: Secondary | ICD-10-CM | POA: Diagnosis not present

## 2017-05-08 DIAGNOSIS — R6884 Jaw pain: Secondary | ICD-10-CM | POA: Diagnosis present

## 2017-05-08 DIAGNOSIS — K047 Periapical abscess without sinus: Secondary | ICD-10-CM

## 2017-05-08 LAB — PREGNANCY, URINE: Preg Test, Ur: NEGATIVE

## 2017-05-08 MED ORDER — PREDNISONE 20 MG PO TABS: 40.0000 mg | ORAL_TABLET | Freq: Every day | ORAL | 0 refills | Status: DC

## 2017-05-08 MED ORDER — CLINDAMYCIN HCL 150 MG PO CAPS: 300.0000 mg | ORAL_CAPSULE | Freq: Four times a day (QID) | ORAL | 0 refills | Status: AC

## 2017-05-08 MED ORDER — PREDNISONE 50 MG PO TABS
60.0000 mg | ORAL_TABLET | Freq: Once | ORAL | Status: AC
  Administered 2017-05-08: 60 mg via ORAL
  Filled 2017-05-08: qty 1

## 2017-05-08 MED FILL — predniSONE 20 MG TABS: 20 | 2 days supply | Qty: 4 | Fill #0

## 2017-05-08 MED FILL — CLINDAMYCIN HCL 150 MG CAPS: 150 | 7 days supply | Qty: 56 | Fill #0

## 2017-05-08 NOTE — ED Provider Notes (Signed)
[name]   CSN: [id number] Arrival date & time: 05/08/17  0825     History   Chief Complaint Chief Complaint  Patient presents with  . Allergic Reaction    HPI Tiffany Hall is a 31 y.o. female.  HPI Patient presents with some tightness and swelling in her upper neck and jaw. Has been seen by dentistry/oral surgery yesterday for pain and started on amoxicillin. Patient states she has had amoxicillin in past and had a reaction to it but just stop it. States she did not tell them this and she was prescribed. Now feels that there is a tightness there. No fevers. Has chills but states she always has chills and is unchanged. No difficulty breathing. States that she needs all four of her wisdom teeth out.   Past Medical History:  Diagnosis Date  . Anemia   . Anxiety   . Depression   . GERD (gastroesophageal reflux disease)    medication not needed  . Heart murmur   . History of abnormal cervical Pap smear    2009  . History of cardiac murmur as a child   . History of chlamydia    2008  . History of gallstones    2012  . History of gonorrhea    2008  . History of ovarian cyst   . History of trichomonal vaginitis    2008  . Hypertension    PIH with pregnancy  . Migraine headache   . Sickle cell trait [facility])     Patient Active Problem List   Diagnosis Date Noted  . Preeclampsia in postpartum period 01/15/2015  . Pregnancy 01/10/2015    Past Surgical History:  Procedure Laterality Date  . COLPOSCOPY    . dilatation and currettage  N/A   . DILATION AND CURETTAGE OF UTERUS  2009   W/  SUCTION --  required blood transfusion  . DILATION AND EVACUATION N/A 09/14/2015   Procedure: DILATATION AND EVACUATION;  Surgeon: Linda Hedges, DO;  Location: [facility];  Service: Gynecology;  Laterality: N/A;    OB History    Gravida Para Term Preterm AB Living   7 5 5  0 1 5   SAB TAB Ectopic Multiple Live Births   1 0 0 0 3       Home Medications      Prior to Admission medications   Medication Sig Start Date End Date Taking? Authorizing Provider  cholecalciferol (VITAMIN D) 1000 units tablet Take 1,000 Units by mouth daily.    [provider]  clindamycin (CLEOCIN) 150 MG capsule Take 2 capsules (300 mg total) by mouth 4 (four) times daily. 05/08/17 05/15/17  Davonna Belling, MD  ferrous sulfate 325 (65 FE) MG tablet Take 325 mg by mouth daily with breakfast.    [provider]  ibuprofen (ADVIL,MOTRIN) 800 MG tablet Take 1 tablet (800 mg total) by mouth every 8 (eight) hours as needed. Patient not taking: Reported on 04/23/2016 09/14/15   Linda Hedges, DO  JUNEL FE 1/20 1-20 MG-MCG tablet Take 1 tablet by mouth daily. 03/26/16   [provider]  predniSONE (DELTASONE) 20 MG tablet Take 2 tablets (40 mg total) by mouth daily. 05/09/17   Davonna Belling, MD    Family History Family History  Problem Relation Age of Onset  . Diabetes Mother   . Hypertension Mother   . Hepatitis Mother   . Cancer Mother 87       Breast, cervical  .  Diabetes Maternal Grandmother   . Hypertension Maternal Grandmother   . Cancer Maternal Grandmother 40       Breast  . Anesthesia problems Neg Hx   . Other Neg Hx     Social History Social History  Substance Use Topics  . Smoking status: Former Smoker    Packs/day: 0.00    Types: Cigarettes    Quit date: 11/05/2010  . Smokeless tobacco: Never Used     Comment: quit with preg  . Alcohol use No     Comment: occ     Allergies   Nubain [nalbuphine hcl]   Review of Systems Review of Systems  Constitutional: Positive for chills. Negative for appetite change.  HENT: Negative for rhinorrhea and trouble swallowing.        Throat tightness and dental pain.  Respiratory: Negative for shortness of breath.   Cardiovascular: Negative for chest pain.  Gastrointestinal: Negative for abdominal pain.  Genitourinary: Negative for vaginal bleeding.  Musculoskeletal: Negative for  back pain.  Neurological: Negative for weakness.     Physical Exam Updated Vital Signs BP 139/87 (BP Location: Right Arm)   Pulse 75   Temp 98.3 F (36.8 C) (Oral)   Resp 18   Ht 5\' 3"  (1.6 m)   Wt 78.5 kg (173 lb)   LMP  (LMP Unknown)   SpO2 100%   BMI 30.65 kg/m   Physical Exam  Constitutional: She appears well-developed.  HENT:  Head: Atraumatic.  Eyes:  Mild swelling around right lower wisdom tooth. No swelling of floor of mouth. No swelling or neck. No stridor.  Neck: Neck supple.  Cardiovascular: Normal rate.   Pulmonary/Chest: Effort normal.  Abdominal: Soft.  Musculoskeletal: Normal range of motion.  Neurological: She is alert.  Skin: Skin is warm.     ED Treatments / Results  Labs (all labs ordered are listed, but only abnormal results are displayed) Labs Reviewed  PREGNANCY, URINE    EKG  EKG Interpretation None       Radiology No results found.  Procedures Procedures (including critical care time)  Medications Ordered in ED Medications  predniSONE (DELTASONE) tablet 60 mg (60 mg Oral Given 05/08/17 6962)     Initial Impression / Assessment and Plan / ED Course  I have reviewed the triage vital signs and the nursing notes.  Pertinent labs & imaging results that were available during my care of the patient were reviewed by me and considered in my medical decision making (see chart for details).     Patient with likely reaction to amoxicillin. States she's had this reaction the past. Does not appear to be severe airway involvement. This does not appear to be dramatically worsening infection. Will change over to clindamycin and prednisone. Will follow with her dentist.  Final Clinical Impressions(s) / ED Diagnoses   Final diagnoses:  Allergic reaction, initial encounter  Dental infection    New Prescriptions Discharge Medication List as of 05/08/2017  9:08 AM    START taking these medications   Details  clindamycin (CLEOCIN) 150 MG  capsule Take 2 capsules (300 mg total) by mouth 4 (four) times daily., Starting Wed 05/08/2017, Until Wed 05/15/2017, Print    predniSONE (DELTASONE) 20 MG tablet Take 2 tablets (40 mg total) by mouth daily., Starting Thu 05/09/2017, Print         Davonna Belling, MD 05/08/17 [phone]

## 2017-05-08 NOTE — Discharge Instructions (Signed)
Follow with your dentist.

## 2017-05-08 NOTE — ED Triage Notes (Addendum)
Patient reports facial swelling which began overnight after taking amoxicillin for dental infection.  States she feels like her tongue is swelling and difficulty swallowing due to feeling like tongue is swollen.  Patient alert and oriented and speaking in full sentences without difficulty, maintaining secretions.  In NAD.  States this happened before when taking amoxicillin but states she stopped taking this due to allergic reaction and was not seen for issue.

## 2017-05-08 NOTE — ED Notes (Signed)
ED Provider at bedside. 

## 2017-07-29 ENCOUNTER — Encounter (HOSPITAL_BASED_OUTPATIENT_CLINIC_OR_DEPARTMENT_OTHER): Payer: Self-pay | Admitting: *Deleted

## 2017-07-29 ENCOUNTER — Emergency Department (HOSPITAL_BASED_OUTPATIENT_CLINIC_OR_DEPARTMENT_OTHER)
Admission: EM | Admit: 2017-07-29 | Discharge: 2017-07-30 | Disposition: A | Discharge status: Home / Self Care | Payer: Medicare Other | Attending: Emergency Medicine | Admitting: Emergency Medicine

## 2017-07-29 ENCOUNTER — Emergency Department (HOSPITAL_BASED_OUTPATIENT_CLINIC_OR_DEPARTMENT_OTHER): Payer: Medicare Other

## 2017-07-29 DIAGNOSIS — Z87891 Personal history of nicotine dependence: Secondary | ICD-10-CM | POA: Insufficient documentation

## 2017-07-29 DIAGNOSIS — R079 Chest pain, unspecified: Secondary | ICD-10-CM | POA: Diagnosis present

## 2017-07-29 DIAGNOSIS — Y939 Activity, unspecified: Secondary | ICD-10-CM | POA: Insufficient documentation

## 2017-07-29 DIAGNOSIS — Y999 Unspecified external cause status: Secondary | ICD-10-CM | POA: Insufficient documentation

## 2017-07-29 DIAGNOSIS — Y92092 Bedroom in other non-institutional residence as the place of occurrence of the external cause: Secondary | ICD-10-CM | POA: Insufficient documentation

## 2017-07-29 DIAGNOSIS — S60221A Contusion of right hand, initial encounter: Secondary | ICD-10-CM | POA: Diagnosis not present

## 2017-07-29 DIAGNOSIS — Z79899 Other long term (current) drug therapy: Secondary | ICD-10-CM | POA: Insufficient documentation

## 2017-07-29 DIAGNOSIS — R0789 Other chest pain: Secondary | ICD-10-CM | POA: Insufficient documentation

## 2017-07-29 DIAGNOSIS — W2209XA Striking against other stationary object, initial encounter: Secondary | ICD-10-CM | POA: Diagnosis not present

## 2017-07-29 DIAGNOSIS — R52 Pain, unspecified: Secondary | ICD-10-CM

## 2017-07-29 MED ORDER — IBUPROFEN 800 MG PO TABS
800.0000 mg | ORAL_TABLET | Freq: Once | ORAL | Status: AC
  Administered 2017-07-30: 800 mg via ORAL
  Filled 2017-07-29: qty 1

## 2017-07-29 NOTE — ED Triage Notes (Signed)
Right chest pain and numbness in her right hand for an hour tonight. 2 days ago she was having a panic attack per pt.

## 2017-07-29 NOTE — ED Notes (Signed)
States punched a bed yesterday  C/o pain to rt hand radiating up arm into rt chest

## 2017-07-29 NOTE — ED Provider Notes (Signed)
TIME SEEN: 11:53 PM  CHIEF COMPLAINT: right hand and right chest pain  HPI: Pt is a 31 y.o. right-hand-dominant female with history of depression, GERD who presents to the emergency department with right hand and right chest wall pain after she punched her bedpost after getting mad at her husband yesterday.  No other injury.  No current numbness or focal weakness. Pain with movement of the arm. No shortness of breath, nausea vomiting, diaphoresis. No history of CAD.  No history of PE, DVT, recent fractures, surgery, trauma, hospitalization or prolonged travel. No lower extremity swelling or pain. No calf tenderness.  She is currently on birth control.  ROS: See HPI Constitutional: no fever  Eyes: no drainage  ENT: no runny nose   Cardiovascular:  no chest pain  Resp: no SOB  GI: no vomiting GU: no dysuria Integumentary: no rash  Allergy: no hives  Musculoskeletal: no leg swelling  Neurological: no slurred speech ROS otherwise negative  PAST MEDICAL HISTORY/PAST SURGICAL HISTORY:  Past Medical History:  Diagnosis Date  . Anemia   . Anxiety   . Depression   . GERD (gastroesophageal reflux disease)    medication not needed  . Heart murmur   . History of abnormal cervical Pap smear    2009  . History of cardiac murmur as a child   . History of chlamydia    2008  . History of gallstones    2012  . History of gonorrhea    2008  . History of ovarian cyst   . History of trichomonal vaginitis    2008  . Hypertension    PIH with pregnancy  . Migraine headache   . Sickle cell trait (HCC)     MEDICATIONS:  Prior to Admission medications   Medication Sig Start Date End Date Taking? Authorizing Provider  cholecalciferol (VITAMIN D) 1000 units tablet Take 1,000 Units by mouth daily.   Yes [provider]  ferrous sulfate 325 (65 FE) MG tablet Take 325 mg by mouth daily with breakfast.   Yes [provider]  ibuprofen (ADVIL,MOTRIN) 800 MG tablet Take 1 tablet  (800 mg total) by mouth every 8 (eight) hours as needed. 09/14/15  Yes Morris, Megan, DO  JUNEL FE 1/20 1-20 MG-MCG tablet Take 1 tablet by mouth daily. 03/26/16  Yes [provider]  predniSONE (DELTASONE) 20 MG tablet Take 2 tablets (40 mg total) by mouth daily. 05/09/17   Davonna Belling, MD    ALLERGIES:  Allergies  Allergen Reactions  . Amoxicillin     swelling  . Nubain [Nalbuphine Hcl] Swelling, Palpitations and Other (See Comments)    Pt states that she is able to take Percocet without any problems.       SOCIAL HISTORY:  Social History  Substance Use Topics  . Smoking status: Former Smoker    Packs/day: 0.00    Types: Cigarettes    Quit date: 11/05/2010  . Smokeless tobacco: Never Used     Comment: quit with preg  . Alcohol use No     Comment: occ    FAMILY HISTORY: Family History  Problem Relation Age of Onset  . Diabetes Mother   . Hypertension Mother   . Hepatitis Mother   . Cancer Mother 17       Breast, cervical  . Diabetes Maternal Grandmother   . Hypertension Maternal Grandmother   . Cancer Maternal Grandmother 40       Breast  . Anesthesia problems Neg Hx   .  Other Neg Hx     EXAM: BP 126/82   Pulse 72   Temp 98.5 F (36.9 C) (Oral)   Resp 14   Ht 5' 3.5" (1.613 m)   Wt 79.4 kg (175 lb)   SpO2 100%   BMI 30.51 kg/m  CONSTITUTIONAL: Alert and oriented and responds appropriately to questions. Well-appearing; well-nourished HEAD: Normocephalic, atraumatic EYES: Conjunctivae clear, pupils appear equal, EOMI ENT: normal nose; moist mucous membranes NECK: Supple, no meningismus, no nuchal rigidity, no LAD, no midline spinal tenderness or step-off or deformity  CARD: RRR; S1 and S2 appreciated; no murmurs, no clicks, no rubs, no gallops RESP: Normal chest excursion without splinting or tachypnea; breath sounds clear and equal bilaterally; no wheezes, no rhonchi, no rales, no hypoxia or respiratory distress, speaking full sentences ABD/GI:  Normal bowel sounds; non-distended; soft, non-tender, no rebound, no guarding, no peritoneal signs, no hepatosplenomegaly BACK:  The back appears normal and is non-tender to palpation, there is no CVA tenderness, no midline spinal tenderness or step-off or deformity EXT: Tender to palpation over the dorsal radial aspect of the right hand, proximal right forearm and mid right humerus. Also tender over the right chest wall. No tenderness of the right wrist or tenderness over the scaphoid. No pain with axial loading of the thumb. Normal grip strength in the right hand with 2+ radial pulse. Normal range of motion all joints of the right arm. Compartments are soft. No bony deformity, ecchymosis or soft tissue swelling noted.  Normal ROM in all joints; otherwise extremities are non-tender to palpation; no edema; normal capillary refill; no cyanosis, no calf tenderness or swelling    SKIN: Normal color for age and race; warm; no rash NEURO: Moves all extremities equally, reports normal sensation diffusely PSYCH: The patient's mood and manner are appropriate. Grooming and personal hygiene are appropriate.  MEDICAL DECISION MAKING: Patient here with right arm and chest pain after she punched her bedpost yesterday. X-ray show no fracture. No sign of compartment syndrome. This seems to be musculoskeletal in nature. Doubt ACS, PE or dissection. EKG shows no ischemic abnormality. Given ibuprofen for pain control. I have offered her stronger medication in the emergency department as she still appears uncomfortablebut she declines this. 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 Discussed return precautions. Recommended ice, elevation. Has been asked for a sling but I do not feel comfortable providing this for the patient as it can put her at risk for adhesive capsulitis.  Patient comfortable with this plan. She has an outpatient primary care  doctor for follow-up.   At this time, I do not feel there is any life-threatening condition present. I have reviewed and discussed all results (EKG, imaging, lab, urine as appropriate) and exam findings with patient/family. I have reviewed nursing notes and appropriate previous records.  I feel the patient is safe to be discharged home without further emergent workup and can continue workup as an outpatient as needed. Discussed usual and customary return precautions. Patient/family verbalize understanding and are comfortable with this plan.  Outpatient follow-up has been provided if needed. All questions have been answered.       EKG Interpretation  Date/Time:  Monday July 29 2017 21:54:25 EDT Ventricular Rate:  69 PR Interval:  198 QRS Duration: 88 QT Interval:  358 QTC Calculation: 383 R Axis:   64 Text Interpretation:  Normal sinus rhythm Normal ECG No significant change since last  tracing Confirmed by Dorie Rank [phone]) on 07/29/2017 10:05:02 PM         Zareena Willis, Delice Bison, DO 07/30/17 [id number]

## 2017-07-30 ENCOUNTER — Emergency Department (HOSPITAL_BASED_OUTPATIENT_CLINIC_OR_DEPARTMENT_OTHER): Payer: Medicare Other

## 2017-07-30 DIAGNOSIS — R0789 Other chest pain: Secondary | ICD-10-CM | POA: Diagnosis not present

## 2017-07-30 NOTE — Discharge Instructions (Signed)
You may alternate Tylenol 1000 mg every 6 hours as needed for pain and Ibuprofen 800 mg every 8 hours as needed for pain.  Please take Ibuprofen with food. ° °

## 2017-10-27 ENCOUNTER — Encounter (HOSPITAL_BASED_OUTPATIENT_CLINIC_OR_DEPARTMENT_OTHER): Payer: Self-pay | Admitting: Emergency Medicine

## 2017-10-27 ENCOUNTER — Emergency Department (HOSPITAL_BASED_OUTPATIENT_CLINIC_OR_DEPARTMENT_OTHER): Payer: Medicare Other

## 2017-10-27 ENCOUNTER — Other Ambulatory Visit: Payer: Self-pay

## 2017-10-27 ENCOUNTER — Emergency Department (HOSPITAL_BASED_OUTPATIENT_CLINIC_OR_DEPARTMENT_OTHER)
Admission: EM | Admit: 2017-10-27 | Discharge: 2017-10-27 | Disposition: A | Discharge status: Home / Self Care | Payer: Medicare Other | Attending: Emergency Medicine | Admitting: Emergency Medicine

## 2017-10-27 DIAGNOSIS — F17210 Nicotine dependence, cigarettes, uncomplicated: Secondary | ICD-10-CM | POA: Insufficient documentation

## 2017-10-27 DIAGNOSIS — B9789 Other viral agents as the cause of diseases classified elsewhere: Secondary | ICD-10-CM

## 2017-10-27 DIAGNOSIS — I10 Essential (primary) hypertension: Secondary | ICD-10-CM | POA: Insufficient documentation

## 2017-10-27 DIAGNOSIS — Z79899 Other long term (current) drug therapy: Secondary | ICD-10-CM | POA: Diagnosis not present

## 2017-10-27 DIAGNOSIS — J069 Acute upper respiratory infection, unspecified: Secondary | ICD-10-CM | POA: Diagnosis not present

## 2017-10-27 DIAGNOSIS — R05 Cough: Secondary | ICD-10-CM | POA: Diagnosis present

## 2017-10-27 MED ORDER — ACETAMINOPHEN 325 MG PO TABS
650.0000 mg | ORAL_TABLET | Freq: Once | ORAL | Status: AC | PRN
  Administered 2017-10-27: 650 mg via ORAL
  Filled 2017-10-27: qty 2

## 2017-10-27 MED ORDER — BENZONATATE 100 MG PO CAPS: 100.0000 mg | ORAL_CAPSULE | Freq: Three times a day (TID) | ORAL | 0 refills | Status: DC | PRN

## 2017-10-27 NOTE — ED Provider Notes (Signed)
[facility] EMERGENCY DEPARTMENT Provider Note   CSN: [id number] Arrival date & time: 10/27/17  1411     History   Chief Complaint Chief Complaint  Patient presents with  . flu like symptoms    HPI Tiffany Hall is a 32 y.o. female presents today for evaluation of dry cough, chills, and generalized body aches for 3 days.  Symptoms began Friday evening.  Cough is nonproductive.  She does endorse nasal congestion and mild sore throat.  Mild throbbing frontal headache which is similar to headaches she has had in the past.  She endorses mild aching anterior chest wall pain with cough.  Denies shortness of breath.  No nausea, vomiting, or abdominal pain.  She has tried over-the-counter cold medication with some improvement.  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  No recent long distance travel or surgeries, no hemoptysis, no prior history of DVT or PE.  The history is provided by the patient.    Past Medical History:  Diagnosis Date  . Anemia   . Anxiety   . Depression   . GERD (gastroesophageal reflux disease)    medication not needed  . Heart murmur   . History of abnormal cervical Pap smear    2009  . History of cardiac murmur as a child   . History of chlamydia    2008  . History of gallstones    2012  . History of gonorrhea    2008  . History of ovarian cyst   . History of trichomonal vaginitis    2008  . Hypertension    PIH with pregnancy  . Migraine headache   . Sickle cell trait [facility])     Patient Active Problem List   Diagnosis Date Noted  . Preeclampsia in postpartum period 01/15/2015  . Pregnancy 01/10/2015    Past Surgical History:  Procedure Laterality Date  . COLPOSCOPY    . dilatation and currettage  N/A   . DILATION AND CURETTAGE OF UTERUS  2009   W/  SUCTION --  required blood transfusion  . DILATION AND EVACUATION N/A 09/14/2015   Procedure: DILATATION AND EVACUATION;  Surgeon:  Linda Hedges, DO;  Location: [facility];  Service: Gynecology;  Laterality: N/A;    OB History    Gravida Para Term Preterm AB Living   7 5 5  0 1 5   SAB TAB Ectopic Multiple Live Births   1 0 0 0 3       Home Medications    Prior to Admission medications   Medication Sig Start Date End Date Taking? Authorizing Provider  benzonatate (TESSALON) 100 MG capsule Take 1 capsule (100 mg total) by mouth 3 (three) times daily as needed for cough. 10/27/17   Keyden Pavlov A, PA-C  cholecalciferol (VITAMIN D) 1000 units tablet Take 1,000 Units by mouth daily.    [provider]  ferrous sulfate 325 (65 FE) MG tablet Take 325 mg by mouth daily with breakfast.    [provider]  ibuprofen (ADVIL,MOTRIN) 800 MG tablet Take 1 tablet (800 mg total) by mouth every 8 (eight) hours as needed. 09/14/15   Linda Hedges, DO  JUNEL FE 1/20 1-20 MG-MCG tablet Take 1 tablet by mouth daily. 03/26/16   [provider]  predniSONE (DELTASONE) 20 MG tablet Take 2 tablets (40 mg total) by mouth daily. 05/09/17   Davonna Belling, MD    Family History Family History  Problem Relation Age of Onset  . Diabetes Mother   . Hypertension Mother   . Hepatitis Mother   . Cancer Mother 84       Breast, cervical  . Diabetes Maternal Grandmother   . Hypertension Maternal Grandmother   . Cancer Maternal Grandmother 40       Breast  . Anesthesia problems Neg Hx   . Other Neg Hx     Social History Social History   Tobacco Use  . Smoking status: Former Smoker    Packs/day: 0.00    Types: Cigarettes    Last attempt to quit: 11/05/2010    Years since quitting: 6.9  . Smokeless tobacco: Never Used  . Tobacco comment: quit with preg  Substance Use Topics  . Alcohol use: No    Alcohol/week: 0.0 oz    Comment: occ  . Drug use: No    Comment: prior to this pregnancy in 2009     Allergies   Amoxicillin and Nubain [nalbuphine hcl]   Review of Systems Review of Systems  Constitutional:  Positive for chills. Negative for fever.  HENT: Positive for congestion and sore throat.   Respiratory: Positive for cough. Negative for shortness of breath.   Cardiovascular: Positive for chest pain (With cough only).  Gastrointestinal: Negative for abdominal pain, diarrhea, nausea and vomiting.  Musculoskeletal: Negative for neck pain and neck stiffness.  Neurological: Positive for headaches.     Physical Exam Updated Vital Signs BP (!) 147/76 (BP Location: Right Arm)   Pulse 95   Temp 100.1 F (37.8 C) (Oral)   Resp 16   Ht 5' 3.5" (1.613 m)   Wt 78.9 kg (174 lb)   SpO2 100%   BMI 30.34 kg/m   Physical Exam  Constitutional: She appears well-developed and well-nourished. No distress.  HENT:  Head: Normocephalic and atraumatic.  Right Ear: Tympanic membrane, external ear and ear canal normal.  Left Ear: Tympanic membrane, external ear and ear canal normal.  Nose: Mucosal edema present. No septal deviation. Right sinus exhibits frontal sinus tenderness. Right sinus exhibits no maxillary sinus tenderness. Left sinus exhibits frontal sinus tenderness. Left sinus exhibits no maxillary sinus tenderness.  Mouth/Throat: Uvula is midline and oropharynx is clear and moist. No trismus in the jaw. No oropharyngeal exudate, posterior oropharyngeal edema, posterior oropharyngeal erythema or tonsillar abscesses. No tonsillar exudate.  No tonsillar hypertrophy, postnasal drip noted  Eyes: Conjunctivae and EOM are normal. Pupils are equal, round, and reactive to light. Right eye exhibits no discharge. Left eye exhibits no discharge.  Neck: Normal range of motion. Neck supple. No JVD present. No tracheal deviation present.  Bilateral anterior cervical LAD  Cardiovascular: Normal rate, regular rhythm, normal heart sounds and intact distal pulses.  Pulmonary/Chest: Effort normal and breath sounds normal. No stridor. No respiratory distress. She has no wheezes. She has no rales. She exhibits  tenderness.  Right anterior parasternal chest wall tenderness to palpation with no deformity, crepitus, ecchymosis, or paradoxical wall motion.  Equal rise and fall of chest, no increased work of breathing.  Abdominal: Soft. Bowel sounds are normal. She exhibits no distension. There is no tenderness.  Musculoskeletal: Normal range of motion. She exhibits no edema or tenderness.  Lymphadenopathy:    She has cervical adenopathy.  Neurological: She is alert.  Skin: Skin is warm and dry. No erythema.  Psychiatric: She has a normal mood and affect. Her behavior is normal.  Nursing note and vitals reviewed.    ED Treatments /  Results  Labs (all labs ordered are listed, but only abnormal results are displayed) Labs Reviewed - No data to display  EKG  EKG Interpretation None       Radiology Dg Chest 2 View  Result Date: 10/27/2017 CLINICAL DATA:  Cough and congestion. EXAM: CHEST  2 VIEW COMPARISON:  July 30, 2017 FINDINGS: The heart size and mediastinal contours are within normal limits. Both lungs are clear. The visualized skeletal structures are unremarkable. IMPRESSION: No active cardiopulmonary disease. Electronically Signed   By: Dorise Bullion III M.D   On: 10/27/2017 16:52    Procedures Procedures (including critical care time)  Medications Ordered in ED Medications  acetaminophen (TYLENOL) tablet 650 mg (650 mg Oral Given 10/27/17 1701)     Initial Impression / Assessment and Plan / ED Course  I have reviewed the triage vital signs and the nursing notes.  Pertinent labs & imaging results that were available during my care of the patient were reviewed by me and considered in my medical decision making (see chart for details).     Patient complaining of symptoms of sinusitis vs viral uri with cough.  Mild to moderate symptoms of clear/yellow nasal discharge/congestion and scratchy throat with cough for less than 10 days. Low grade fever but nontoxic in appearance.  No  concern for acute bacterial rhinosinusitis; likely viral in nature.  Chest pain appears to be entirely musculoskeletal in nature only present with cough and reproducible on palpation on the right side.  Doubt ACS or MI.  Doubt PE in the absence of hypoxia or shortness of breath.  Doubt influenza.  She was given Tylenol in the ED with improvement in her symptoms.  Patient discharged with symptomatic treatment.  Recommendations for follow-up with primary care physician for reevaluation of symptoms.  Discussed indications for return to the ED. Pt verbalized understanding of and agreement with plan and is safe for discharge home at this time.   Final Clinical Impressions(s) / ED Diagnoses   Final diagnoses:  Viral URI with cough    ED Discharge Orders        Ordered    benzonatate (TESSALON) 100 MG capsule  3 times daily PRN     10/27/17 1717       Renita Papa, PA-C 10/27/17 1720    Malvin Johns, MD 10/27/17 2354

## 2017-10-27 NOTE — ED Triage Notes (Signed)
Cough, body aches, chills since yesterday. 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

## 2017-10-27 NOTE — Discharge Instructions (Signed)
Continue to stay well-hydrated. Use tessalon as needed for cough. Gargle warm salt water and spit it out for sore throat. May also use cough drops, warm teas, etc. Take flonase to decrease nasal congestion. Over the counter allergy medicine for nasal congestion and scratchy throat. Alternate 600 mg of ibuprofen and [phone] mg of Tylenol every 3 hours as needed for pain/fever. Do not exceed 4000 mg of Tylenol daily.   Followup with your primary care doctor in 5-7 days for recheck of ongoing symptoms. Return to emergency department for emergent changing or worsening of symptoms such as throat tightness, facial swelling, fever not controlled by ibuprofen or Tylenol,difficulty breathing, or chest pain.

## 2017-10-27 NOTE — ED Notes (Signed)
Pt c/o fever, body aches, chills, dry cough x 2 days

## 2018-01-06 ENCOUNTER — Emergency Department (HOSPITAL_BASED_OUTPATIENT_CLINIC_OR_DEPARTMENT_OTHER)
Admission: EM | Admit: 2018-01-06 | Discharge: 2018-01-06 | Disposition: A | Discharge status: Home / Self Care | Payer: Medicare Other | Attending: Emergency Medicine | Admitting: Emergency Medicine

## 2018-01-06 ENCOUNTER — Other Ambulatory Visit: Payer: Self-pay

## 2018-01-06 ENCOUNTER — Encounter (HOSPITAL_BASED_OUTPATIENT_CLINIC_OR_DEPARTMENT_OTHER): Payer: Self-pay | Admitting: Emergency Medicine

## 2018-01-06 DIAGNOSIS — R112 Nausea with vomiting, unspecified: Secondary | ICD-10-CM | POA: Insufficient documentation

## 2018-01-06 DIAGNOSIS — R1084 Generalized abdominal pain: Secondary | ICD-10-CM | POA: Diagnosis not present

## 2018-01-06 DIAGNOSIS — I10 Essential (primary) hypertension: Secondary | ICD-10-CM | POA: Insufficient documentation

## 2018-01-06 DIAGNOSIS — Z87891 Personal history of nicotine dependence: Secondary | ICD-10-CM | POA: Insufficient documentation

## 2018-01-06 DIAGNOSIS — R197 Diarrhea, unspecified: Secondary | ICD-10-CM

## 2018-01-06 LAB — URINALYSIS, ROUTINE W REFLEX MICROSCOPIC
Bilirubin Urine: NEGATIVE
Glucose, UA: NEGATIVE mg/dL
KETONES UR: NEGATIVE mg/dL
LEUKOCYTES UA: NEGATIVE
NITRITE: NEGATIVE
PROTEIN: NEGATIVE mg/dL
Specific Gravity, Urine: 1.025 (ref 1.005–1.030)
pH: 6 (ref 5.0–8.0)

## 2018-01-06 LAB — PREGNANCY, URINE: PREG TEST UR: NEGATIVE

## 2018-01-06 LAB — URINALYSIS, MICROSCOPIC (REFLEX)

## 2018-01-06 MED ORDER — DICYCLOMINE HCL 10 MG PO CAPS
10.0000 mg | ORAL_CAPSULE | Freq: Once | ORAL | Status: AC
  Administered 2018-01-06: 10 mg via ORAL
  Filled 2018-01-06: qty 1

## 2018-01-06 MED ORDER — DICYCLOMINE HCL 20 MG PO TABS: 20.0000 mg | ORAL_TABLET | Freq: Two times a day (BID) | ORAL | 0 refills | Status: DC

## 2018-01-06 MED ORDER — LOPERAMIDE HCL 2 MG PO CAPS: 2.0000 mg | ORAL_CAPSULE | Freq: Four times a day (QID) | ORAL | 0 refills | Status: DC | PRN

## 2018-01-06 MED ORDER — ONDANSETRON 4 MG PO TBDP: 4.0000 mg | ORAL_TABLET | Freq: Three times a day (TID) | ORAL | 0 refills | Status: DC | PRN

## 2018-01-06 MED ORDER — ONDANSETRON 4 MG PO TBDP
4.0000 mg | ORAL_TABLET | Freq: Once | ORAL | Status: AC
  Administered 2018-01-06: 4 mg via ORAL
  Filled 2018-01-06: qty 1

## 2018-01-06 NOTE — ED Notes (Signed)
Pt verbalizes understanding of d/c instructions and denies any further needs at this time. 

## 2018-01-06 NOTE — ED Provider Notes (Signed)
Emergency Department Provider Note   I have reviewed the triage vital signs and the nursing notes.   HISTORY  Chief Complaint No chief complaint on file.   HPI Tiffany Hall is a 32 y.o. female with PMH of HTN and GERD [place] to the emergency department for evaluation of acute onset nausea, vomiting, diarrhea.  Symptoms began while at work today.  She has some associated abdominal cramping.  Denies any dysuria, hesitancy, urgency.  No productive cough.  No blood in the emesis or stool.  No vaginal bleeding or discharge.  She has multiple sick contacts with similar symptoms as the patient does work in [employer].  She estimates approximately 10 episodes of nonbloody diarrhea with one episode of vomiting today.   Past Medical History:  Diagnosis Date  . Anemia   . Anxiety   . Depression   . GERD (gastroesophageal reflux disease)    medication not needed  . Heart murmur   . History of abnormal cervical Pap smear    2009  . History of cardiac murmur as a child   . History of chlamydia    2008  . History of gallstones    2012  . History of gonorrhea    2008  . History of ovarian cyst   . History of trichomonal vaginitis    2008  . Hypertension    PIH with pregnancy  . Migraine headache   . Sickle cell trait [facility])     Patient Active Problem List   Diagnosis Date Noted  . Preeclampsia in postpartum period 01/15/2015  . Pregnancy 01/10/2015    Past Surgical History:  Procedure Laterality Date  . COLPOSCOPY    . dilatation and currettage  N/A   . DILATION AND CURETTAGE OF UTERUS  2009   W/  SUCTION --  required blood transfusion  . DILATION AND EVACUATION N/A 09/14/2015   Procedure: DILATATION AND EVACUATION;  Surgeon: Linda Hedges, DO;  Location: [place] [facility];  Service: Gynecology;  Laterality: N/A;    Current Outpatient Rx  . Order #: [id number] Class: Print  . Order #: [id number] Class: Historical Med  . Order #: [id number] Class: Print  . Order #: [id number] Class:  Historical Med  . Order #: [id number] Class: Normal  . Order #: [id number] Class: Historical Med  . Order #: [id number] Class: Print  . Order #: [id number] Class: Print  . Order #: [id number] Class: Print    Allergies Amoxicillin and Nubain [nalbuphine hcl]  Family History  Problem Relation Age of Onset  . Diabetes Mother   . Hypertension Mother   . Hepatitis Mother   . Cancer Mother 77       Breast, cervical  . Diabetes Maternal Grandmother   . Hypertension Maternal Grandmother   . Cancer Maternal Grandmother 40       Breast  . Anesthesia problems Neg Hx   . Other Neg Hx     Social History Social History   Tobacco Use  . Smoking status: Former Smoker    Packs/day: 0.00    Types: Cigarettes    Last attempt to quit: 11/05/2010    Years since quitting: 7.1  . Smokeless tobacco: Never Used  . Tobacco comment: quit with preg  Substance Use Topics  . Alcohol use: No    Alcohol/week: 0.0 oz    Comment: occ  . Drug use: No    Types: MDMA (Ecstacy)    Comment: prior to this pregnancy in 2009    Review of Systems  Constitutional: No fever/chills Eyes: No  visual changes. ENT: No sore throat. Cardiovascular: Denies chest pain. Respiratory: Denies shortness of breath. Gastrointestinal: No abdominal pain. Positive nausea, vomiting, and  diarrhea.  No constipation. Genitourinary: Negative for dysuria. Musculoskeletal: Negative for back pain. Skin: Negative for rash. Neurological: Negative for headaches, focal weakness or numbness.  10-point ROS otherwise negative.  ____________________________________________   PHYSICAL EXAM:  VITAL SIGNS: ED Triage Vitals  Enc Vitals Group     BP 01/06/18 1725 127/80     Pulse Rate 01/06/18 1725 68     Resp 01/06/18 1725 18     Temp 01/06/18 1725 98.4 F (36.9 C)     Temp Source 01/06/18 1725 Oral     SpO2 01/06/18 1725 100 %     Weight 01/06/18 1723 173 lb (78.5 kg)     Height 01/06/18 1723 5' 3.5" (1.613 m)     Pain Score  01/06/18 1723 6   Constitutional: Alert and oriented. Well appearing and in no acute distress. Eyes: Conjunctivae are normal.  Head: Atraumatic. Nose: No congestion/rhinnorhea. Mouth/Throat: Mucous membranes are moist.   Neck: No stridor.   Cardiovascular: Normal rate, regular rhythm. Good peripheral circulation. Grossly normal heart sounds.   Respiratory: Normal respiratory effort.  No retractions. Lungs CTAB. Gastrointestinal: Soft and nontender. No distention.  Musculoskeletal: No lower extremity tenderness nor edema. No gross deformities of extremities. Neurologic:  Normal speech and language. No gross focal neurologic deficits are appreciated.  Skin:  Skin is warm, dry and intact. No rash noted.   ____________________________________________   LABS (all labs ordered are listed, but only abnormal results are displayed)  Labs Reviewed  URINALYSIS, ROUTINE W REFLEX MICROSCOPIC - Abnormal; Notable for the following components:      Result Value   Hgb urine dipstick MODERATE (*)    All other components within normal limits  URINALYSIS, MICROSCOPIC (REFLEX) - Abnormal; Notable for the following components:   Bacteria, UA RARE (*)    Squamous Epithelial / LPF 0-5 (*)    All other components within normal limits  PREGNANCY, URINE   ____________________________________________  RADIOLOGY  None ____________________________________________   PROCEDURES  Procedure(s) performed:   Procedures  None ____________________________________________   INITIAL IMPRESSION / ASSESSMENT AND PLAN / ED COURSE  Pertinent labs & imaging results that were available during my care of the patient were reviewed by me and considered in my medical decision making (see chart for details).  Acute onset nausea, vomiting, diarrhea.  She has some abdominal cramping pain.  She is diffusely nontender to palpation of the abdomen in all quadrants.  No history of diabetes.  Suspect viral  gastrointestinal illness.  With acute onset symptoms just hours prior to ED presentation I do not feel that labs are required at this time.  Will obtain urinalysis, urine pregnancy, ODT Zofran, Bentyl, and reassess after PO challenge.   Patient feeling better after meds and PO challenge. No vomiting or diarrhea here. Plan for supportive care at home and PCP follow up appointment.   At this time, I do not feel there is any life-threatening condition present. I have reviewed and discussed all results (EKG, imaging, lab, urine as appropriate), exam findings with patient. I have reviewed nursing notes and appropriate previous records.  I feel the patient is safe to be discharged home without further emergent workup. Discussed usual and customary return precautions. Patient and family (if present) verbalize understanding and are comfortable with this plan.  Patient will follow-up with their primary care provider. If they do not  have a primary care provider, information for follow-up has been provided to them. All questions have been answered.  ____________________________________________  FINAL CLINICAL IMPRESSION(S) / ED DIAGNOSES  Final diagnoses:  Non-intractable vomiting with nausea, unspecified vomiting type  Diarrhea, unspecified type     MEDICATIONS GIVEN DURING THIS VISIT:  Medications  ondansetron (ZOFRAN-ODT) disintegrating tablet 4 mg (4 mg Oral Given 01/06/18 1804)  dicyclomine (BENTYL) capsule 10 mg (10 mg Oral Given 01/06/18 1803)     NEW OUTPATIENT MEDICATIONS STARTED DURING THIS VISIT:  Discharge Medication List as of 01/06/2018  7:24 PM    START taking these medications   Details  dicyclomine (BENTYL) 20 MG tablet Take 1 tablet (20 mg total) by mouth 2 (two) times daily., Starting Mon 01/06/2018, Print    loperamide (IMODIUM) 2 MG capsule Take 1 capsule (2 mg total) by mouth 4 (four) times daily as needed for diarrhea or loose stools., Starting Mon 01/06/2018, Print    ondansetron  (ZOFRAN ODT) 4 MG disintegrating tablet Take 1 tablet (4 mg total) by mouth every 8 (eight) hours as needed for nausea or vomiting., Starting Mon 01/06/2018, Print        Note:  This document was prepared using Dragon voice recognition software and may include unintentional dictation errors.  Nanda Quinton, MD Emergency Medicine    Damel Querry, Wonda Olds, MD 01/07/18 [phone]

## 2018-01-06 NOTE — ED Triage Notes (Signed)
Patient states that she started to have N/V/D at work today - she is having some cramping in her abdominal area

## 2018-01-06 NOTE — Discharge Instructions (Signed)

## 2018-02-25 DIAGNOSIS — G43909 Migraine, unspecified, not intractable, without status migrainosus: Secondary | ICD-10-CM | POA: Insufficient documentation

## 2018-04-14 ENCOUNTER — Other Ambulatory Visit: Payer: Self-pay

## 2018-04-14 ENCOUNTER — Encounter (HOSPITAL_BASED_OUTPATIENT_CLINIC_OR_DEPARTMENT_OTHER): Payer: Self-pay | Admitting: *Deleted

## 2018-04-14 ENCOUNTER — Emergency Department (HOSPITAL_BASED_OUTPATIENT_CLINIC_OR_DEPARTMENT_OTHER)
Admission: EM | Admit: 2018-04-14 | Discharge: 2018-04-14 | Disposition: A | Discharge status: Home / Self Care | Payer: Managed Care, Other (non HMO) | Attending: Emergency Medicine | Admitting: Emergency Medicine

## 2018-04-14 ENCOUNTER — Emergency Department (HOSPITAL_BASED_OUTPATIENT_CLINIC_OR_DEPARTMENT_OTHER): Payer: Managed Care, Other (non HMO)

## 2018-04-14 DIAGNOSIS — Z79899 Other long term (current) drug therapy: Secondary | ICD-10-CM | POA: Diagnosis not present

## 2018-04-14 DIAGNOSIS — M79605 Pain in left leg: Secondary | ICD-10-CM | POA: Diagnosis not present

## 2018-04-14 DIAGNOSIS — R0789 Other chest pain: Secondary | ICD-10-CM | POA: Insufficient documentation

## 2018-04-14 DIAGNOSIS — I10 Essential (primary) hypertension: Secondary | ICD-10-CM | POA: Insufficient documentation

## 2018-04-14 DIAGNOSIS — Z87891 Personal history of nicotine dependence: Secondary | ICD-10-CM | POA: Diagnosis not present

## 2018-04-14 LAB — CBC WITH DIFFERENTIAL/PLATELET
BASOS ABS: 0 10*3/uL (ref 0.0–0.1)
Basophils Relative: 1 %
EOS ABS: 0.3 10*3/uL (ref 0.0–0.7)
EOS PCT: 4 %
HCT: 32.2 % — ABNORMAL LOW (ref 36.0–46.0)
HEMOGLOBIN: 11.5 g/dL — AB (ref 12.0–15.0)
LYMPHS ABS: 3.2 10*3/uL (ref 0.7–4.0)
Lymphocytes Relative: 39 %
MCH: 27.6 pg (ref 26.0–34.0)
MCHC: 35.7 g/dL (ref 30.0–36.0)
MCV: 77.4 fL — ABNORMAL LOW (ref 78.0–100.0)
Monocytes Absolute: 0.6 10*3/uL (ref 0.1–1.0)
Monocytes Relative: 7 %
NEUTROS PCT: 49 %
Neutro Abs: 4.1 10*3/uL (ref 1.7–7.7)
PLATELETS: 358 10*3/uL (ref 150–400)
RBC: 4.16 MIL/uL (ref 3.87–5.11)
RDW: 12.9 % (ref 11.5–15.5)
WBC: 8.3 10*3/uL (ref 4.0–10.5)

## 2018-04-14 LAB — TROPONIN I

## 2018-04-14 LAB — BASIC METABOLIC PANEL
Anion gap: 8 (ref 5–15)
BUN: 14 mg/dL (ref 6–20)
CHLORIDE: 104 mmol/L (ref 98–111)
CO2: 24 mmol/L (ref 22–32)
CREATININE: 0.65 mg/dL (ref 0.44–1.00)
Calcium: 8.7 mg/dL — ABNORMAL LOW (ref 8.9–10.3)
Glucose, Bld: 90 mg/dL (ref 70–99)
POTASSIUM: 3.5 mmol/L (ref 3.5–5.1)
Sodium: 136 mmol/L (ref 135–145)

## 2018-04-14 LAB — D-DIMER, QUANTITATIVE (NOT AT ARMC): D DIMER QUANT: 0.28 ug{FEU}/mL (ref 0.00–0.50)

## 2018-04-14 NOTE — ED Triage Notes (Signed)
Pt co left lower leg pain x 1 week and SOB

## 2018-04-14 NOTE — ED Notes (Signed)
Patient transported to Ultrasound 

## 2018-04-14 NOTE — ED Notes (Signed)
Pt c/o left inner, upper leg swelling that is not appreciated by nurse upon exam.

## 2018-04-14 NOTE — Discharge Instructions (Addendum)
Your lab work and your imaging were negative for any abnormality. Please follow up with your primary care doctor for further concern. Contact a health care provider if: Your chest pain does not go away. You have a rash with blisters on your chest. You have a fever. You have chills. Get help right away if: Your chest pain is worse. You have a cough that gets worse, or you cough up blood. You have severe pain in your abdomen. You have severe weakness. You faint. You have sudden, unexplained chest discomfort. You have sudden, unexplained discomfort in your arms, back, neck, or jaw. You have shortness of breath at any time. You suddenly start to sweat, or your skin gets clammy. You feel nauseous or you vomit. You suddenly feel light-headed or dizzy. Your heart begins to beat quickly, or it feels like it is skipping beats.

## 2018-04-14 NOTE — ED Provider Notes (Signed)
[place] EMERGENCY DEPARTMENT Provider Note   CSN: [id number] Arrival date & time: 04/14/18  1730     History   Chief Complaint Chief Complaint  Patient presents with  . Leg Pain    HPI Tiffany Hall is a 32 y.o. female who presents the emergency department with chief complaint of leg pain and shortness of breath.  The patient states that she has noticed swelling in the medial upper thigh of her left leg for the past 5 days.  She states that today she suddenly became short of breath and complains of pleuritic type chest pain.  She has had an associated cough recently.  She denies chest pressure, nausea, diaphoresis.  The patient denies a history of smoking, recent car travel, injury, confinement, or history of DVT or PE.  She is on oral contraceptive pills.  She does not meet PERC.  HPI  Past Medical History:  Diagnosis Date  . Anemia   . Anxiety   . Depression   . GERD (gastroesophageal reflux disease)    medication not needed  . Heart murmur   . History of abnormal cervical Pap smear    2009  . History of cardiac murmur as a child   . History of chlamydia    2008  . History of gallstones    2012  . History of gonorrhea    2008  . History of ovarian cyst   . History of trichomonal vaginitis    2008  . Hypertension    PIH with pregnancy  . Migraine headache   . Sickle cell trait [facility])     Patient Active Problem List   Diagnosis Date Noted  . Preeclampsia in postpartum period 01/15/2015  . Pregnancy 01/10/2015    Past Surgical History:  Procedure Laterality Date  . COLPOSCOPY    . dilatation and currettage  N/A   . DILATION AND CURETTAGE OF UTERUS  2009   W/  SUCTION --  required blood transfusion  . DILATION AND EVACUATION N/A 09/14/2015   Procedure: DILATATION AND EVACUATION;  Surgeon: Linda Hedges, DO;  Location: [place] [facility];  Service: Gynecology;  Laterality: N/A;     OB History    Gravida  7   Para  5   Term  5   Preterm  0   AB  1     Living  5     SAB  1   TAB  0   Ectopic  0   Multiple  0   Live Births  3            Home Medications    Prior to Admission medications   Medication Sig Start Date End Date Taking? Authorizing Provider  benzonatate (TESSALON) 100 MG capsule Take 1 capsule (100 mg total) by mouth 3 (three) times daily as needed for cough. 10/27/17   Fawze, Mina A, PA-C  cholecalciferol (VITAMIN D) 1000 units tablet Take 1,000 Units by mouth daily.    [provider]  dicyclomine (BENTYL) 20 MG tablet Take 1 tablet (20 mg total) by mouth 2 (two) times daily. 01/06/18   Long, Wonda Olds, MD  ferrous sulfate 325 (65 FE) MG tablet Take 325 mg by mouth daily with breakfast.    [provider]  ibuprofen (ADVIL,MOTRIN) 800 MG tablet Take 1 tablet (800 mg total) by mouth every 8 (eight) hours as needed. 09/14/15   Linda Hedges, DO  JUNEL FE 1/20 1-20 MG-MCG tablet Take 1 tablet by mouth  daily. 03/26/16   [provider]  loperamide (IMODIUM) 2 MG capsule Take 1 capsule (2 mg total) by mouth 4 (four) times daily as needed for diarrhea or loose stools. 01/06/18   Long, Wonda Olds, MD  ondansetron (ZOFRAN ODT) 4 MG disintegrating tablet Take 1 tablet (4 mg total) by mouth every 8 (eight) hours as needed for nausea or vomiting. 01/06/18   Long, Wonda Olds, MD  predniSONE (DELTASONE) 20 MG tablet Take 2 tablets (40 mg total) by mouth daily. 05/09/17   Davonna Belling, MD    Family History Family History  Problem Relation Age of Onset  . Diabetes Mother   . Hypertension Mother   . Hepatitis Mother   . Cancer Mother 66       Breast, cervical  . Diabetes Maternal Grandmother   . Hypertension Maternal Grandmother   . Cancer Maternal Grandmother 40       Breast  . Anesthesia problems Neg Hx   . Other Neg Hx     Social History Social History   Tobacco Use  . Smoking status: Former Smoker    Packs/day: 0.00    Types: Cigarettes    Last attempt to quit: 11/05/2010    Years since  quitting: 7.4  . Smokeless tobacco: Never Used  . Tobacco comment: quit with preg  Substance Use Topics  . Alcohol use: No    Comment: occ  . Drug use: No    Types: MDMA (Ecstacy)    Comment: prior to this pregnancy in 2009     Allergies   Amoxicillin and Nubain [nalbuphine hcl]   Review of Systems Review of Systems Ten systems reviewed and are negative for acute change, except as noted in the HPI.   Ten systems reviewed and are negative for acute change, except as noted in the HPI.  Physical Exam Updated Vital Signs BP 124/89   Pulse 68   Temp 98.4 F (36.9 C)   Resp 11   Ht 5\' 3"  (1.6 m)   Wt 82.1 kg (181 lb)   LMP  (LMP Unknown)   SpO2 100%   BMI 32.06 kg/m   Physical Exam  Constitutional: She is oriented to person, place, and time. She appears well-developed and well-nourished. No distress.  HENT:  Head: Normocephalic and atraumatic.  Eyes: Conjunctivae are normal. No scleral icterus.  Neck: Normal range of motion.  Cardiovascular: Normal rate, regular rhythm and normal heart sounds. Exam reveals no gallop and no friction rub.  No murmur heard. Pulmonary/Chest: Effort normal and breath sounds normal. No respiratory distress. She has no wheezes.  Abdominal: Soft. Bowel sounds are normal. She exhibits no distension and no mass. There is no tenderness. There is no guarding.  Musculoskeletal: Normal range of motion.  No obvious leg swelling difference.  Normal DP and PT pulses bilaterally  Neurological: She is alert and oriented to person, place, and time.  Skin: Skin is warm and dry. She is not diaphoretic.  Psychiatric: Her behavior is normal.  Nursing note and vitals reviewed.    ED Treatments / Results  Labs (all labs ordered are listed, but only abnormal results are displayed) Labs Reviewed  CBC WITH DIFFERENTIAL/PLATELET - Abnormal; Notable for the following components:      Result Value   Hemoglobin 11.5 (*)    HCT 32.2 (*)    MCV 77.4 (*)    All  other components within normal limits  BASIC METABOLIC PANEL  TROPONIN I  D-DIMER, QUANTITATIVE (NOT AT  [place])    EKG None  ECG interpretation   Date: 04/14/2018  Rate: 72  Rhythm: normal sinus rhythm  QRS Axis: normal  Intervals: normal  ST/T Wave abnormalities: normal  Conduction Disutrbances: none  Narrative Interpretation:   Old EKG Reviewed: No significant changes noted     Radiology No results found.  Procedures Procedures (including critical care time)  Medications Ordered in ED Medications - No data to display   Initial Impression / Assessment and Plan / ED Course  I have reviewed the triage vital signs and the nursing notes.  Pertinent labs & imaging results that were available during my care of the patient were reviewed by me and considered in my medical decision making (see chart for details).     The emergent differential diagnosis of chest pain includes: Acute coronary syndrome, pericarditis, aortic dissection, pulmonary embolism, tension pneumothorax, and esophageal rupture.  Patient Low risk for ACS, however she does not meet PERC.  Her d-dimer is negative and I have very low suspicion for PE.   DVT study negative. CXR WNL  I do not believe the patient has an emergent cause of chest pain, other urgent/non-acute considerations include, but are not limited to: chronic angina, aortic stenosis, cardiomyopathy, myocarditis, mitral valve prolapse, pulmonary hypertension, hypertrophic obstructive cardiomyopathy (HOCM), aortic insufficiency, right ventricular hypertrophy, pneumonia, pleuritis, bronchitis, pneumothorax, tumor, gastroesophageal reflux disease (GERD), esophageal spasm, Mallory-Weiss syndrome, peptic ulcer disease, biliary disease, pancreatitis, functional gastrointestinal pain, cervical or thoracic disk disease or arthritis, shoulder arthritis, costochondritis, subacromial bursitis, anxiety or panic attack, herpes zoster, breast disorders, chest wall  tumors, thoracic outlet syndrome, mediastinitis.  The patient appears reasonably screened and/or stabilized for discharge and I doubt any other medical condition or other [facility] requiring further screening, evaluation, or treatment in the ED at this time prior to discharge.      Final Clinical Impressions(s) / ED Diagnoses   Final diagnoses:  Left leg pain  Atypical chest pain    ED Discharge Orders    None       Margarita Mail, PA-C 04/14/18 2347    Little, Wenda Overland, MD 04/15/18 1555

## 2018-08-07 ENCOUNTER — Encounter (HOSPITAL_COMMUNITY): Payer: Self-pay

## 2018-08-07 ENCOUNTER — Emergency Department (HOSPITAL_COMMUNITY)
Admission: EM | Admit: 2018-08-07 | Discharge: 2018-08-07 | Disposition: A | Discharge status: Home / Self Care | Payer: Medicare Other | Attending: Emergency Medicine | Admitting: Emergency Medicine

## 2018-08-07 ENCOUNTER — Emergency Department (HOSPITAL_COMMUNITY): Payer: Medicare Other

## 2018-08-07 DIAGNOSIS — R0789 Other chest pain: Secondary | ICD-10-CM | POA: Diagnosis present

## 2018-08-07 DIAGNOSIS — Z87891 Personal history of nicotine dependence: Secondary | ICD-10-CM | POA: Diagnosis not present

## 2018-08-07 DIAGNOSIS — Z79899 Other long term (current) drug therapy: Secondary | ICD-10-CM | POA: Diagnosis not present

## 2018-08-07 DIAGNOSIS — I10 Essential (primary) hypertension: Secondary | ICD-10-CM | POA: Insufficient documentation

## 2018-08-07 LAB — CBC
HEMATOCRIT: 35 % — AB (ref 36.0–46.0)
HEMOGLOBIN: 11.5 g/dL — AB (ref 12.0–15.0)
MCH: 26 pg (ref 26.0–34.0)
MCHC: 32.9 g/dL (ref 30.0–36.0)
MCV: 79 fL — ABNORMAL LOW (ref 80.0–100.0)
Platelets: 411 10*3/uL — ABNORMAL HIGH (ref 150–400)
RBC: 4.43 MIL/uL (ref 3.87–5.11)
RDW: 12.8 % (ref 11.5–15.5)
WBC: 7.2 10*3/uL (ref 4.0–10.5)
nRBC: 0 % (ref 0.0–0.2)

## 2018-08-07 LAB — I-STAT BETA HCG BLOOD, ED (MC, WL, AP ONLY): I-stat hCG, quantitative: <5 m[IU]/mL (ref <5)

## 2018-08-07 LAB — BASIC METABOLIC PANEL
ANION GAP: 5 (ref 5–15)
BUN: 8 mg/dL (ref 6–20)
CHLORIDE: 105 mmol/L (ref 98–111)
CO2: 26 mmol/L (ref 22–32)
Calcium: 9.1 mg/dL (ref 8.9–10.3)
Creatinine, Ser: 0.52 mg/dL (ref 0.44–1.00)
GFR calc Af Amer: >60 mL/min (ref >60)
GFR calc non Af Amer: >60 mL/min (ref >60)
GLUCOSE: 115 mg/dL — AB (ref 70–99)
POTASSIUM: 3.9 mmol/L (ref 3.5–5.1)
Sodium: 136 mmol/L (ref 135–145)

## 2018-08-07 LAB — I-STAT TROPONIN, ED: Troponin i, poc: 0 ng/mL (ref 0.00–0.08)

## 2018-08-07 MED ORDER — ONDANSETRON HCL 4 MG PO TABS: 4.0000 mg | ORAL_TABLET | Freq: Four times a day (QID) | ORAL | 0 refills | Status: DC

## 2018-08-07 NOTE — ED Provider Notes (Signed)
[place] EMERGENCY DEPARTMENT Provider Note   CSN: [id number] Arrival date & time: 08/07/18  1219     History   Chief Complaint Chief Complaint  Patient presents with  . Chest Pain    HPI Tiffany Hall is a 32 y.o. female.   HPI  32 year old female presents today with complaints of left-sided chest pain.  Patient notes symptoms started this morning in the left anterior chest wall.  She notes sharp pain in the upper lateral chest wall worse with palpation, worse with inspiration, worse with movement.  Patient denies any shortness of breath, denies any infectious symptoms.  She reports that she was lifting her car ended up this morning but denies any other strenuous activity.  She denies any history of same.  She has taken Prilosec earlier with no improvement in symptoms.  She denies any lower extremity swelling or edema, history DVT or PE or any other significant risk factors other than occasional smoking and contraceptive use.   Past Medical History:  Diagnosis Date  . Anemia   . Anxiety   . Depression   . GERD (gastroesophageal reflux disease)    medication not needed  . Heart murmur   . History of abnormal cervical Pap smear    2009  . History of cardiac murmur as a child   . History of chlamydia    2008  . History of gallstones    2012  . History of gonorrhea    2008  . History of ovarian cyst   . History of trichomonal vaginitis    2008  . Hypertension    PIH with pregnancy  . Migraine headache   . Sickle cell trait [facility])     Patient Active Problem List   Diagnosis Date Noted  . Preeclampsia in postpartum period 01/15/2015  . Pregnancy 01/10/2015    Past Surgical History:  Procedure Laterality Date  . COLPOSCOPY    . dilatation and currettage  N/A   . DILATION AND CURETTAGE OF UTERUS  2009   W/  SUCTION --  required blood transfusion  . DILATION AND EVACUATION N/A 09/14/2015   Procedure: DILATATION AND EVACUATION;  Surgeon:  Linda , DO;  Location: [facility];  Service: Gynecology;  Laterality: N/A;     OB History    Gravida  7   Para  5   Term  5   Preterm  0   AB  1   Living  5     SAB  1   TAB  0   Ectopic  0   Multiple  0   Live Births  3            Home Medications    Prior to Admission medications   Medication Sig Start Date End Date Taking? Authorizing Provider  benzonatate (TESSALON) 100 MG capsule Take 1 capsule (100 mg total) by mouth 3 (three) times daily as needed for cough. 10/27/17   Fawze, Mina A, PA-C  cholecalciferol (VITAMIN D) 1000 units tablet Take 1,000 Units by mouth daily.    [provider]  dicyclomine (BENTYL) 20 MG tablet Take 1 tablet (20 mg total) by mouth 2 (two) times daily. 01/06/18   Long, Wonda Olds, MD  ferrous sulfate 325 (65 FE) MG tablet Take 325 mg by mouth daily with breakfast.    [provider]  ibuprofen (ADVIL,MOTRIN) 800 MG tablet Take 1 tablet (800 mg total) by mouth every 8 (eight) hours as needed. 09/14/15  Morris, Megan, DO  JUNEL FE 1/20 1-20 MG-MCG tablet Take 1 tablet by mouth daily. 03/26/16   [provider]  loperamide (IMODIUM) 2 MG capsule Take 1 capsule (2 mg total) by mouth 4 (four) times daily as needed for diarrhea or loose stools. 01/06/18   Long, Wonda Olds, MD  ondansetron (ZOFRAN ODT) 4 MG disintegrating tablet Take 1 tablet (4 mg total) by mouth every 8 (eight) hours as needed for nausea or vomiting. 01/06/18   Long, Wonda Olds, MD  predniSONE (DELTASONE) 20 MG tablet Take 2 tablets (40 mg total) by mouth daily. 05/09/17   Davonna Belling, MD    Family History Family History  Problem Relation Age of Onset  . Diabetes Mother   . Hypertension Mother   . Hepatitis Mother   . Cancer Mother 79       Breast, cervical  . Diabetes Maternal Grandmother   . Hypertension Maternal Grandmother   . Cancer Maternal Grandmother 40       Breast  . Anesthesia problems Neg Hx   . Other Neg Hx     Social  History Social History   Tobacco Use  . Smoking status: Former Smoker    Packs/day: 0.00    Types: Cigarettes    Last attempt to quit: 11/05/2010    Years since quitting: 7.7  . Smokeless tobacco: Never Used  . Tobacco comment: quit with preg  Substance Use Topics  . Alcohol use: No    Comment: occ  . Drug use: No    Types: MDMA (Ecstacy)    Comment: prior to this pregnancy in 2009     Allergies   Amoxicillin and Nubain [nalbuphine hcl]   Review of Systems Review of Systems  All other systems reviewed and are negative.   Physical Exam Updated Vital Signs BP 116/68   Pulse 77   Temp 98.3 F (36.8 C)   Resp 16   Ht 5' 3.5" (1.613 m)   Wt 78.5 kg   SpO2 100%   BMI 30.16 kg/m   Physical Exam  Constitutional: She is oriented to person, place, and time. She appears well-developed and well-nourished.  HENT:  Head: Normocephalic and atraumatic.  Eyes: Pupils are equal, round, and reactive to light. Conjunctivae are normal. Right eye exhibits no discharge. Left eye exhibits no discharge. No scleral icterus.  Neck: Normal range of motion. No JVD present. No tracheal deviation present.  Cardiovascular: Normal rate, regular rhythm, normal heart sounds and intact distal pulses. Exam reveals no gallop and no friction rub.  No murmur heard. Pulmonary/Chest: Effort normal. No stridor.  Tenderness palpation of left upper chest wall, no rash, lung sounds clear, no adventitious lung sounds  Musculoskeletal:  No edema to the lower extremities calf nontender  Neurological: She is alert and oriented to person, place, and time. Coordination normal.  Psychiatric: She has a normal mood and affect. Her behavior is normal. Judgment and thought content normal.  Nursing note and vitals reviewed.    ED Treatments / Results  Labs (all labs ordered are listed, but only abnormal results are displayed) Labs Reviewed  BASIC METABOLIC PANEL - Abnormal; Notable for the following components:       Result Value   Glucose, Bld 115 (*)    All other components within normal limits  CBC - Abnormal; Notable for the following components:   Hemoglobin 11.5 (*)    HCT 35.0 (*)    MCV 79.0 (*)    Platelets 411 (*)  All other components within normal limits  I-STAT TROPONIN, ED  I-STAT BETA HCG BLOOD, ED ([facility], [facility], [facility] ONLY)    EKG None ED EKG 72 bpm no ST elevation or depression  Radiology Dg Chest 2 View  Result Date: 08/07/2018 CLINICAL DATA:  Chest pain and shortness of breath EXAM: CHEST - 2 VIEW COMPARISON:  04/14/2018 FINDINGS: The heart size and mediastinal contours are within normal limits. Both lungs are clear. The visualized skeletal structures are unremarkable. IMPRESSION: No active cardiopulmonary disease. Electronically Signed   By: Inez Catalina M.D.   On: 08/07/2018 13:32    Procedures Procedures (including critical care time)  Medications Ordered in ED Medications - No data to display   Initial Impression / Assessment and Plan / ED Course  I have reviewed the triage vital signs and the nursing notes.  Pertinent labs & imaging results that were available during my care of the patient were reviewed by me and considered in my medical decision making (see chart for details).     Labs: Stat troponin, i-STAT beta-hCG, BMP CBC  Imaging: DG chest 2 view  Consults:  Therapeutics:  Discharge Meds:   Assessment/Plan: 32 year old female presents today with complaints of chest pain.  I have very high suspicion for chest wall pain, she has reproducible pain in the left upper anterior chest wall, when she takes a deep breath the pain is worsened in that same area.  She has absolutely no shortness of breath, no infectious signs or symptoms and clear lung sounds.  She has no history of DVT or PE she has no physical findings consistent with DVT.  Patient does occasionally smoke and is taking estrogen-based birth control.  She had a recent flight but this was from  [place] to [place] which is a short flight, no prolonged immobilization other than that.  I had a discussion with the patient that this is very low likely to be a pulmonary embolism, more likely to be muscular pain.  She agrees that close outpatient management would be appropriate if she develops any new or worsening signs or symptoms she will return to the emergency room.  She verbalized understanding and agreement to today's plan had no further questions or concerns.   Final Clinical Impressions(s) / ED Diagnoses   Final diagnoses:  Chest wall pain    ED Discharge Orders         Ordered    ondansetron (ZOFRAN) 4 MG tablet  Every 6 hours,   Status:  Discontinued     08/07/18 1435           Okey Regal, PA-C 08/07/18 1454    Pattricia Boss, MD 08/08/18 1427

## 2018-08-07 NOTE — ED Triage Notes (Signed)
Pt given 324 ASA by EMS

## 2018-08-07 NOTE — ED Triage Notes (Addendum)
Pt presents for evaluation of chest pain from PCP office. States pain started this morning, worse with deep respirations and palpations. Pt recently flew out of country.

## 2018-08-07 NOTE — Discharge Instructions (Addendum)
Please read attached information. If you experience any new or worsening signs or symptoms please return to the emergency room for evaluation. Please follow-up with your primary care provider or specialist as discussed. Please use medication prescribed only as directed and discontinue taking if you have any concerning signs or symptoms.   °

## 2018-11-06 ENCOUNTER — Encounter: Payer: Self-pay | Admitting: Emergency Medicine

## 2018-11-06 ENCOUNTER — Ambulatory Visit
Admission: EM | Admit: 2018-11-06 | Discharge: 2018-11-06 | Disposition: A | Discharge status: Home / Self Care | Payer: Managed Care, Other (non HMO) | Attending: Family Medicine | Admitting: Family Medicine

## 2018-11-06 DIAGNOSIS — R1031 Right lower quadrant pain: Secondary | ICD-10-CM | POA: Insufficient documentation

## 2018-11-06 DIAGNOSIS — Z3202 Encounter for pregnancy test, result negative: Secondary | ICD-10-CM | POA: Diagnosis not present

## 2018-11-06 LAB — POCT URINALYSIS DIP (MANUAL ENTRY)
BILIRUBIN UA: NEGATIVE
Glucose, UA: NEGATIVE mg/dL
Ketones, POC UA: NEGATIVE mg/dL
Leukocytes, UA: NEGATIVE
NITRITE UA: NEGATIVE
PH UA: 6 (ref 5.0–8.0)
PROTEIN UA: NEGATIVE mg/dL
Spec Grav, UA: >=1.03 — AB (ref 1.010–1.025)
UROBILINOGEN UA: 1 U/dL

## 2018-11-06 LAB — POCT URINE PREGNANCY: PREG TEST UR: NEGATIVE

## 2018-11-06 MED ORDER — METRONIDAZOLE 500 MG PO TABS: 500.0000 mg | ORAL_TABLET | Freq: Two times a day (BID) | ORAL | 0 refills | Status: AC

## 2018-11-06 NOTE — ED Triage Notes (Signed)
Pt presents to [facility] for assessment of lower abdominal pain cramping x 1 week.  C/o radiation to right leg when severe.  Thick white discharge as well.  Pt is sexually active, LMP unknown, pt is on "continuous birth control".

## 2018-11-06 NOTE — Discharge Instructions (Signed)
Begin metronidazole twice daily for 1 week to treat BV, No alcohol until 24 hours after last tablet  We are testing you for Gonorrhea, Chlamydia, Trichomonas, Yeast and Bacterial Vaginosis. We will call you if anything is positive and let you know if you require any further treatment. Please inform partners of any positive results.   Please return if symptoms not improving with treatment, development of fever, nausea, vomiting, abdominal pain.

## 2018-11-06 NOTE — ED Notes (Signed)
Patient able to ambulate independently  

## 2018-11-06 NOTE — ED Provider Notes (Signed)
[facility]    CSN: [id number] Arrival date & time: 11/06/18  1809     History   Chief Complaint Chief Complaint  Patient presents with  . Pelvic Pain    HPI Dallas Scorsone is a 33 y.o. female history of hypertension, recurrent BV presenting today for evaluation of right lower abdominal pain.  Patient states that she has had a Cramping sensation in her right lower abdomen.  Symptoms began approximately 1 week ago.  She is also had discharge associated with this.  States that it is not as much discharge as she typically has when she has BV.  She has had some dysuria this morning, but no persistent burning with urination.  She denies any associated fever, nausea, vomiting or diarrhea.  Patient does not have regular menstrual cycles.  She is currently on oral contraceptives.  Does admit to having previous history of ovarian cysts.  HPI  Past Medical History:  Diagnosis Date  . Anemia   . Anxiety   . Depression   . GERD (gastroesophageal reflux disease)    medication not needed  . Heart murmur   . History of abnormal cervical Pap smear    2009  . History of cardiac murmur as a child   . History of chlamydia    2008  . History of gallstones    2012  . History of gonorrhea    2008  . History of ovarian cyst   . History of trichomonal vaginitis    2008  . Hypertension    PIH with pregnancy  . Migraine headache   . Sickle cell trait [facility])     Patient Active Problem List   Diagnosis Date Noted  . Preeclampsia in postpartum period 01/15/2015  . Pregnancy 01/10/2015    Past Surgical History:  Procedure Laterality Date  . COLPOSCOPY    . dilatation and currettage  N/A   . DILATION AND CURETTAGE OF UTERUS  2009   W/  SUCTION --  required blood transfusion  . DILATION AND EVACUATION N/A 09/14/2015   Procedure: DILATATION AND EVACUATION;  Surgeon: Linda Hedges, DO;  Location: [facility];  Service: Gynecology;  Laterality: N/A;    OB History    Gravida  7   Para  5   Term  5   Preterm  0   AB  1   Living  5     SAB  1   TAB  0   Ectopic  0   Multiple  0   Live Births  3            Home Medications    Prior to Admission medications   Medication Sig Start Date End Date Taking? Authorizing Provider  ALPRAZolam Duanne Moron) 0.5 MG tablet Take 0.5 mg by mouth 2 (two) times daily as needed for anxiety.   Yes [provider]  cholecalciferol (VITAMIN D) 1000 units tablet Take 50,000 Units by mouth daily.    Yes [provider]  escitalopram (LEXAPRO) 10 MG tablet Take 10 mg by mouth daily.   Yes [provider]  ferrous sulfate 325 (65 FE) MG tablet Take 325 mg by mouth daily with breakfast.   Yes [provider]  vitamin B-12 (CYANOCOBALAMIN) 100 MCG tablet Take 100 mcg by mouth daily.   Yes [provider]  ibuprofen (ADVIL,MOTRIN) 800 MG tablet Take 1 tablet (800 mg total) by mouth every 8 (eight) hours as needed. 09/14/15   Linda Hedges, DO  JUNEL FE 1/20 1-20 MG-MCG tablet Take 1 tablet by mouth daily. 03/26/16   [provider]  loperamide (IMODIUM) 2 MG capsule Take 1 capsule (2 mg total) by mouth 4 (four) times daily as needed for diarrhea or loose stools. 01/06/18   Long, Wonda Olds, MD  metroNIDAZOLE (FLAGYL) 500 MG tablet Take 1 tablet (500 mg total) by mouth 2 (two) times daily for 7 days. 11/06/18 11/13/18  Wieters, Hallie C, PA-C  ondansetron (ZOFRAN ODT) 4 MG disintegrating tablet Take 1 tablet (4 mg total) by mouth every 8 (eight) hours as needed for nausea or vomiting. 01/06/18   Long, Wonda Olds, MD    Family History Family History  Problem Relation Age of Onset  . Diabetes Mother   . Hypertension Mother   . Hepatitis Mother   . Cancer Mother 69       Breast, cervical  . Diabetes Maternal Grandmother   . Hypertension Maternal Grandmother   . Cancer Maternal Grandmother 40       Breast  . Anesthesia problems Neg Hx   . Other Neg Hx     Social History Social  History   Tobacco Use  . Smoking status: Former Smoker    Packs/day: 0.00    Types: Cigarettes    Last attempt to quit: 11/05/2010    Years since quitting: 8.0  . Smokeless tobacco: Never Used  . Tobacco comment: quit with preg  Substance Use Topics  . Alcohol use: No    Comment: occ  . Drug use: No    Types: MDMA (Ecstacy)    Comment: prior to this pregnancy in 2009     Allergies   Amoxicillin and Nubain [nalbuphine hcl]   Review of Systems Review of Systems  Constitutional: Negative for fever.  Respiratory: Negative for shortness of breath.   Cardiovascular: Negative for chest pain.  Gastrointestinal: Positive for abdominal pain. Negative for diarrhea, nausea and vomiting.  Genitourinary: Positive for dysuria and vaginal discharge. Negative for flank pain, genital sores, hematuria, menstrual problem, vaginal bleeding and vaginal pain.  Musculoskeletal: Negative for back pain.  Skin: Negative for rash.  Neurological: Negative for dizziness, light-headedness and headaches.     Physical Exam Triage Vital Signs ED Triage Vitals  Enc Vitals Group     BP 11/06/18 1817 116/67     Pulse Rate 11/06/18 1817 75     Resp 11/06/18 1817 18     Temp 11/06/18 1817 98.8 F (37.1 C)     Temp Source 11/06/18 1817 Oral     SpO2 11/06/18 1817 98 %     Weight --      Height --      Head Circumference --      Peak Flow --      Pain Score 11/06/18 1818 4     Pain Loc --      Pain Edu? --      Excl. in [place]? --    No data found.  Updated Vital Signs BP 116/67 (BP Location: Left Arm)   Pulse 75   Temp 98.8 F (37.1 C) (Oral)   Resp 18   SpO2 98%   Visual Acuity Right Eye Distance:   Left Eye Distance:   Bilateral Distance:    Right Eye Near:   Left Eye Near:    Bilateral Near:     Physical Exam Vitals signs and nursing note reviewed.  Constitutional:      General: She is not in acute distress.  Appearance: She is well-developed.  HENT:     Head: Normocephalic  and atraumatic.  Eyes:     Conjunctiva/sclera: Conjunctivae normal.  Neck:     Musculoskeletal: Neck supple.  Cardiovascular:     Rate and Rhythm: Normal rate and regular rhythm.     Heart sounds: No murmur.  Pulmonary:     Effort: Pulmonary effort is normal. No respiratory distress.     Breath sounds: Normal breath sounds.  Abdominal:     Palpations: Abdomen is soft.     Tenderness: There is no abdominal tenderness.     Comments: Abdomen soft, nondistended, nontender to light deep palpation throughout abdomen, no focal tenderness  Genitourinary:    Comments: Normal external female genitalia, no external rashes or lesions, vagina with clearish yellowish discharge, no cervical motion tenderness, no cervical erythema, no adnexal masses or discomfort palpated  Slight odor noted Skin:    General: Skin is warm and dry.  Neurological:     Mental Status: She is alert.      UC Treatments / Results  Labs (all labs ordered are listed, but only abnormal results are displayed) Labs Reviewed  POCT URINALYSIS DIP (MANUAL ENTRY) - Abnormal; Notable for the following components:      Result Value   Spec Grav, UA >=1.030 (*)    Blood, UA large (*)    All other components within normal limits  POCT URINE PREGNANCY  CERVICOVAGINAL ANCILLARY ONLY    EKG None  Radiology No results found.  Procedures Procedures (including critical care time)  Medications Ordered in UC Medications - No data to display  Initial Impression / Assessment and Plan / UC Course  I have reviewed the triage vital signs and the nursing notes.  Pertinent labs & imaging results that were available during my care of the patient were reviewed by me and considered in my medical decision making (see chart for details).    Pregnancy negative, UA negative for signs of infection, will empirically treat for BV given patient's history with metronidazole.  Swab obtained to send off to check for other causes of  discharge.  If all results negative, will recommend for patient to follow-up with OB/GYN for further evaluation, possible ovarian cyst, may warrant ultrasound.  Continue to monitor,Discussed strict return precautions. Patient verbalized understanding and is agreeable with plan.  Final Clinical Impressions(s) / UC Diagnoses   Final diagnoses:  Right lower quadrant abdominal pain     Discharge Instructions     Begin metronidazole twice daily for 1 week to treat BV, No alcohol until 24 hours after last tablet  We are testing you for Gonorrhea, Chlamydia, Trichomonas, Yeast and Bacterial Vaginosis. We will call you if anything is positive and let you know if you require any further treatment. Please inform partners of any positive results.   Please return if symptoms not improving with treatment, development of fever, nausea, vomiting, abdominal pain.    ED Prescriptions    Medication Sig Dispense Auth. Provider   metroNIDAZOLE (FLAGYL) 500 MG tablet Take 1 tablet (500 mg total) by mouth 2 (two) times daily for 7 days. 14 tablet Wieters, [place] C, PA-C     Controlled Substance Prescriptions [place] Controlled Substance Registry consulted? Not Applicable   Janith Lima, [place] 11/06/18 1925

## 2018-11-10 LAB — CERVICOVAGINAL ANCILLARY ONLY
Bacterial vaginitis: NEGATIVE
Candida vaginitis: NEGATIVE
Chlamydia: NEGATIVE
Neisseria Gonorrhea: NEGATIVE
Trichomonas: NEGATIVE

## 2018-11-11 ENCOUNTER — Other Ambulatory Visit: Payer: Self-pay | Admitting: Physician Assistant

## 2018-11-11 DIAGNOSIS — N644 Mastodynia: Secondary | ICD-10-CM

## 2018-11-18 ENCOUNTER — Other Ambulatory Visit: Payer: Self-pay | Admitting: Physician Assistant

## 2018-11-18 ENCOUNTER — Ambulatory Visit
Admission: RE | Admit: 2018-11-18 | Discharge: 2018-11-18 | Disposition: A | Discharge status: Home / Self Care | Payer: Managed Care, Other (non HMO) | Source: Ambulatory Visit | Attending: Physician Assistant | Admitting: Physician Assistant

## 2018-11-18 DIAGNOSIS — N644 Mastodynia: Secondary | ICD-10-CM

## 2018-11-18 DIAGNOSIS — N6310 Unspecified lump in the right breast, unspecified quadrant: Secondary | ICD-10-CM

## 2019-02-25 DIAGNOSIS — B009 Herpesviral infection, unspecified: Secondary | ICD-10-CM | POA: Insufficient documentation

## 2019-02-25 DIAGNOSIS — R768 Other specified abnormal immunological findings in serum: Secondary | ICD-10-CM | POA: Insufficient documentation

## 2019-05-26 ENCOUNTER — Other Ambulatory Visit: Payer: Medicare Other

## 2019-07-02 ENCOUNTER — Other Ambulatory Visit: Payer: Self-pay | Admitting: Physician Assistant

## 2019-07-02 ENCOUNTER — Other Ambulatory Visit: Payer: Self-pay | Admitting: Nurse Practitioner

## 2019-07-02 DIAGNOSIS — N6310 Unspecified lump in the right breast, unspecified quadrant: Secondary | ICD-10-CM

## 2019-08-03 ENCOUNTER — Other Ambulatory Visit: Payer: Self-pay | Admitting: Nurse Practitioner

## 2019-08-03 ENCOUNTER — Ambulatory Visit
Admission: RE | Admit: 2019-08-03 | Discharge: 2019-08-03 | Disposition: A | Discharge status: Home / Self Care | Payer: Medicare Other | Source: Ambulatory Visit | Attending: Nurse Practitioner | Admitting: Nurse Practitioner

## 2019-08-03 ENCOUNTER — Other Ambulatory Visit: Payer: Self-pay

## 2019-08-03 DIAGNOSIS — N6310 Unspecified lump in the right breast, unspecified quadrant: Secondary | ICD-10-CM

## 2019-08-06 ENCOUNTER — Emergency Department (HOSPITAL_COMMUNITY)
Admission: EM | Admit: 2019-08-06 | Discharge: 2019-08-06 | Disposition: A | Discharge status: Home / Self Care | Payer: 59 | Attending: Emergency Medicine | Admitting: Emergency Medicine

## 2019-08-06 ENCOUNTER — Other Ambulatory Visit: Payer: Self-pay

## 2019-08-06 ENCOUNTER — Encounter (HOSPITAL_COMMUNITY): Payer: Self-pay | Admitting: Emergency Medicine

## 2019-08-06 DIAGNOSIS — N939 Abnormal uterine and vaginal bleeding, unspecified: Secondary | ICD-10-CM | POA: Diagnosis not present

## 2019-08-06 DIAGNOSIS — Z79899 Other long term (current) drug therapy: Secondary | ICD-10-CM | POA: Diagnosis not present

## 2019-08-06 DIAGNOSIS — Z87891 Personal history of nicotine dependence: Secondary | ICD-10-CM | POA: Insufficient documentation

## 2019-08-06 LAB — WET PREP, GENITAL
Clue Cells Wet Prep HPF POC: NONE SEEN
Sperm: NONE SEEN
Trich, Wet Prep: NONE SEEN
Yeast Wet Prep HPF POC: NONE SEEN

## 2019-08-06 LAB — I-STAT CHEM 8, ED
BUN: 7 mg/dL (ref 6–20)
Calcium, Ion: 1.28 mmol/L (ref 1.15–1.40)
Chloride: 102 mmol/L (ref 98–111)
Creatinine, Ser: 0.6 mg/dL (ref 0.44–1.00)
Glucose, Bld: 89 mg/dL (ref 70–99)
HCT: 37 % (ref 36.0–46.0)
Hemoglobin: 12.6 g/dL (ref 12.0–15.0)
Potassium: 3.9 mmol/L (ref 3.5–5.1)
Sodium: 140 mmol/L (ref 135–145)
TCO2: 26 mmol/L (ref 22–32)

## 2019-08-06 LAB — I-STAT BETA HCG BLOOD, ED (MC, WL, AP ONLY): I-stat hCG, quantitative: <5 m[IU]/mL (ref <5)

## 2019-08-06 MED ORDER — MEDROXYPROGESTERONE ACETATE 10 MG PO TABS: 10.0000 mg | ORAL_TABLET | Freq: Every day | ORAL | 0 refills | Status: DC

## 2019-08-06 NOTE — ED Provider Notes (Signed)
[facility] EMERGENCY DEPARTMENT Provider Note   CSN: [id number] Arrival date & time: 08/06/19  1024     History   Chief Complaint Chief Complaint  Patient presents with  . Vaginal Bleeding    HPI Tiffany Hall is a 33 y.o. female presenting to the emergency department with complaint of vaginal bleeding that began this morning.  She states last night she had some brown discharge and this morning began passing large clots.  She states she has passed out for large clots.  She sent a picture of this to her PCP who instructed to report to the ED for further evaluation.  She denies any associated pain.  LMP was 07/13/2019 and is normally regular.  She is due for her next menstrual period on 08/12/2019.  She is not currently on any contraceptives.  Not on anticoagulation.  She does not currently have a gynecologist.     The history is provided by the patient.    Past Medical History:  Diagnosis Date  . Anemia   . Anxiety   . Depression   . GERD (gastroesophageal reflux disease)    medication not needed  . Heart murmur   . History of abnormal cervical Pap smear    2009  . History of cardiac murmur as a child   . History of chlamydia    2008  . History of gallstones    2012  . History of gonorrhea    2008  . History of ovarian cyst   . History of trichomonal vaginitis    2008  . Hypertension    PIH with pregnancy  . Migraine headache   . Sickle cell trait [facility])     Patient Active Problem List   Diagnosis Date Noted  . Preeclampsia in postpartum period 01/15/2015  . Pregnancy 01/10/2015    Past Surgical History:  Procedure Laterality Date  . COLPOSCOPY    . dilatation and currettage  N/A   . DILATION AND CURETTAGE OF UTERUS  2009   W/  SUCTION --  required blood transfusion  . DILATION AND EVACUATION N/A 09/14/2015   Procedure: DILATATION AND EVACUATION;  Surgeon: Linda Hedges, DO;  Location: [facility];  Service: Gynecology;  Laterality: N/A;      OB History    Gravida  7   Para  5   Term  5   Preterm  0   AB  1   Living  5     SAB  1   TAB  0   Ectopic  0   Multiple  0   Live Births  3            Home Medications    Prior to Admission medications   Medication Sig Start Date End Date Taking? Authorizing Provider  ALPRAZolam Duanne Moron) 0.5 MG tablet Take 0.5 mg by mouth 2 (two) times daily as needed for anxiety.   Yes [provider]  aspirin-acetaminophen-caffeine (EXCEDRIN MIGRAINE) [phone] MG tablet Take 2 tablets by mouth every 6 (six) hours as needed for headache.   Yes [provider]  escitalopram (LEXAPRO) 10 MG tablet Take 10 mg by mouth daily.   Yes [provider]  Prenatal Vit-Fe Fumarate-FA (MULTIVITAMIN-PRENATAL) 27-0.8 MG TABS tablet Take 1 tablet by mouth daily at 12 noon.   Yes [provider]  medroxyPROGESTERone (PROVERA) 10 MG tablet Take 1 tablet (10 mg total) by mouth daily. 08/06/19   Gerrard Crystal, [place] N, PA-C    Family  History Family History  Problem Relation Age of Onset  . Diabetes Mother   . Hypertension Mother   . Hepatitis Mother   . Cancer Mother 57       Breast, cervical  . Diabetes Maternal Grandmother   . Hypertension Maternal Grandmother   . Cancer Maternal Grandmother 40       Breast  . Anesthesia problems Neg Hx   . Other Neg Hx     Social History Social History   Tobacco Use  . Smoking status: Former Smoker    Packs/day: 0.00    Types: Cigarettes    Quit date: 11/05/2010    Years since quitting: 8.7  . Smokeless tobacco: Never Used  . Tobacco comment: quit with preg  Substance Use Topics  . Alcohol use: No    Comment: occ  . Drug use: No    Types: MDMA (Ecstacy)    Comment: prior to this pregnancy in 2009     Allergies   Amoxicillin and Nubain [nalbuphine hcl]   Review of Systems Review of Systems  Genitourinary: Positive for vaginal bleeding.  All other systems reviewed and are negative.    Physical  Exam Updated Vital Signs BP 112/80 (BP Location: Right Arm)   Pulse 75   Temp 98.3 F (36.8 C) (Oral)   Resp 20   LMP 07/13/2019   SpO2 97%   Physical Exam Vitals signs and nursing note reviewed. Exam conducted with a chaperone present.  Constitutional:      General: She is not in acute distress.    Appearance: She is well-developed. She is not ill-appearing.  HENT:     Head: Normocephalic and atraumatic.  Eyes:     Conjunctiva/sclera: Conjunctivae normal.  Cardiovascular:     Rate and Rhythm: Normal rate and regular rhythm.  Pulmonary:     Effort: Pulmonary effort is normal. No respiratory distress.     Breath sounds: Normal breath sounds.  Abdominal:     General: Bowel sounds are normal.     Palpations: Abdomen is soft.     Tenderness: There is no abdominal tenderness.  Genitourinary:    Pubic Area: No rash.      Labia:        Right: No rash or tenderness.        Left: No rash or tenderness.      Comments: Female NT chaperone present.  Large blood clot evacuated from the vagina.  There is a slight oozing of dark red blood present though no brisk bleeding is visualized.  No obvious lacerations.  No tenderness on bimanual exam.  Skin:    General: Skin is warm.  Neurological:     Mental Status: She is alert.  Psychiatric:        Behavior: Behavior normal.      ED Treatments / Results  Labs (all labs ordered are listed, but only abnormal results are displayed) Labs Reviewed  WET PREP, GENITAL - Abnormal; Notable for the following components:      Result Value   WBC, Wet Prep HPF POC FEW (*)    All other components within normal limits  I-STAT BETA HCG BLOOD, ED ([facility], [facility], [facility] ONLY)  I-STAT CHEM 8, ED  GC/CHLAMYDIA PROBE AMP ([facility]) NOT AT [facility]    EKG None  Radiology No results found.  Procedures Procedures (including critical care time)  Medications Ordered in ED Medications - No data to display   Initial Impression / Assessment and Plan / ED  Course  I have reviewed the triage vital signs and the nursing notes.  Pertinent labs & imaging results that were available during my care of the patient were reviewed by me and considered in my medical decision making (see chart for details).  Clinical Course as of Aug 05 1554  Thu Aug 06, 2019  1515 Pt discussed with Ob/GYN nurse midwife on call, Fatima Blank. Recommends begin provera 10mg  daily for 10 days. Office will contact pt for outpt follow up. Appreciate consult.    [JR]    Clinical Course User Index [JR] Derryck Shahan, [place] N, PA-C       Pt presenting with vaginal bleeding that began today. Not on OCPs. LMP 07/13/2019 and normally regular menstrual cycle. Pt is well-appearing and in no distress. VSS. hcg neg. Pelvic with large clot evacuated, slow bleeding of dark red blood present. Hgb is wnl. Discussed with OB/GYn nurse midwife, since pt is currently trying to conceive will start on 10 day course of provera. [facility] to contact pt for f/u. Appreciate consult. Pt agreeable to plan and safe for discharge. Strict return precautions discussed.  Discussed results, findings, treatment and follow up. Patient advised of return precautions. Patient verbalized understanding and agreed with plan.  Final Clinical Impressions(s) / ED Diagnoses   Final diagnoses:  Abnormal vaginal bleeding    ED Discharge Orders         Ordered    medroxyPROGESTERone (PROVERA) 10 MG tablet  Daily     08/06/19 1554           Percy Comp, [place] N, [place] 08/06/19 1555    Carmin Muskrat, MD 08/07/19 [phone]

## 2019-08-06 NOTE — Discharge Instructions (Addendum)
The Gynecology Office will call you to schedule an appointment at the [facility] office.  Take the medication as prescribed. This is cause your bleeding to stop. It may restart again once you've completed the 10 day course but this is expected. The gynecology specialists will provide further management.  Return to the ER for severely worsening bleeding, or new or concerning symptoms.

## 2019-08-06 NOTE — ED Triage Notes (Signed)
Reports LMP on 10/5.  Lower back pain yesterday radiating to abd and R leg.  Reports brown vaginal bleeding yesterday and 4 bright red blood clots today.  Denies pain at present.  Denies nausea and vomiting.

## 2019-08-06 NOTE — ED Notes (Signed)
Called pt name [phone] to be roomed. No response.

## 2019-08-06 NOTE — ED Notes (Signed)
Patient verbalizes understanding of discharge instructions. Opportunity for questioning and answers were provided. Armband removed by staff, pt discharged from ED.  

## 2019-08-07 LAB — GC/CHLAMYDIA PROBE AMP (~~LOC~~) NOT AT ARMC
Chlamydia: NEGATIVE
Neisseria Gonorrhea: NEGATIVE

## 2019-08-10 ENCOUNTER — Other Ambulatory Visit: Payer: Medicare Other

## 2019-10-15 ENCOUNTER — Telehealth: Payer: Medicare Other | Admitting: Physician Assistant

## 2019-10-15 ENCOUNTER — Other Ambulatory Visit: Payer: Self-pay

## 2019-10-15 ENCOUNTER — Encounter: Payer: Self-pay | Admitting: Family Medicine

## 2019-10-15 ENCOUNTER — Ambulatory Visit (INDEPENDENT_AMBULATORY_CARE_PROVIDER_SITE_OTHER): Payer: 59 | Admitting: Family Medicine

## 2019-10-15 VITALS — BP 127/86 | HR 74 | Ht 63.5 in | Wt 188.0 lb

## 2019-10-15 DIAGNOSIS — N920 Excessive and frequent menstruation with regular cycle: Secondary | ICD-10-CM

## 2019-10-15 DIAGNOSIS — N939 Abnormal uterine and vaginal bleeding, unspecified: Secondary | ICD-10-CM | POA: Diagnosis not present

## 2019-10-15 DIAGNOSIS — R5383 Other fatigue: Secondary | ICD-10-CM

## 2019-10-15 IMAGING — MG DIGITAL DIAGNOSTIC BILATERAL MAMMOGRAM WITH TOMO AND CAD
6 of 10 series · 6 of 30 positions shown · non-contrast
Comparison: Previous exam(s).

CLINICAL DATA: The patient's mother and maternal grandmother both
had breast cancer in their 30s. The patient presents with focal pain
on the right.

EXAM:
DIGITAL DIAGNOSTIC BILATERAL MAMMOGRAM WITH CAD AND TOMO
ULTRASOUND RIGHT BREAST

[L CC synth-2D]
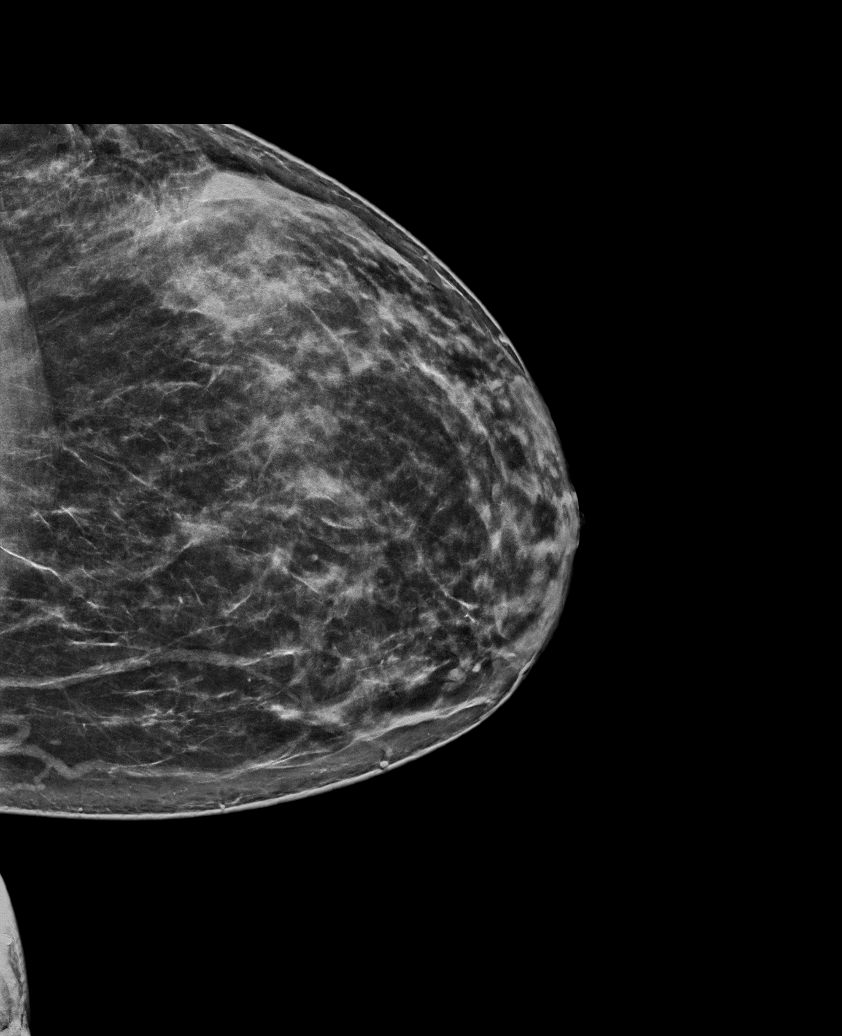

[R TAN synth-2D]
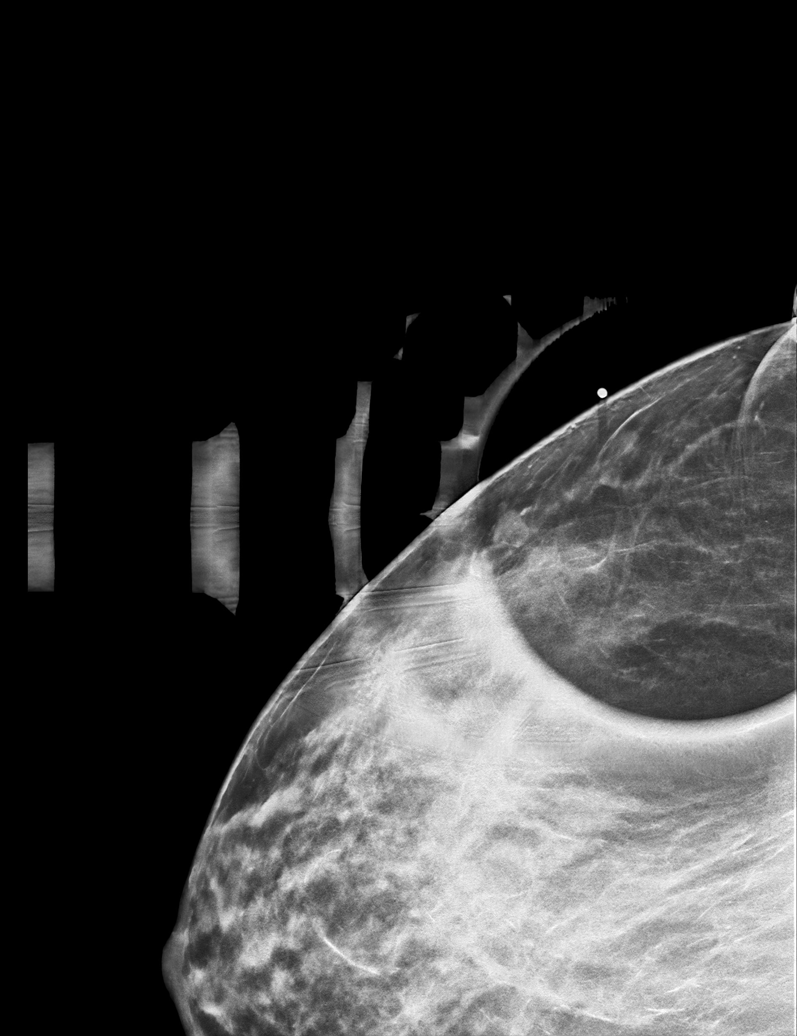

[L MLO synth-2D]
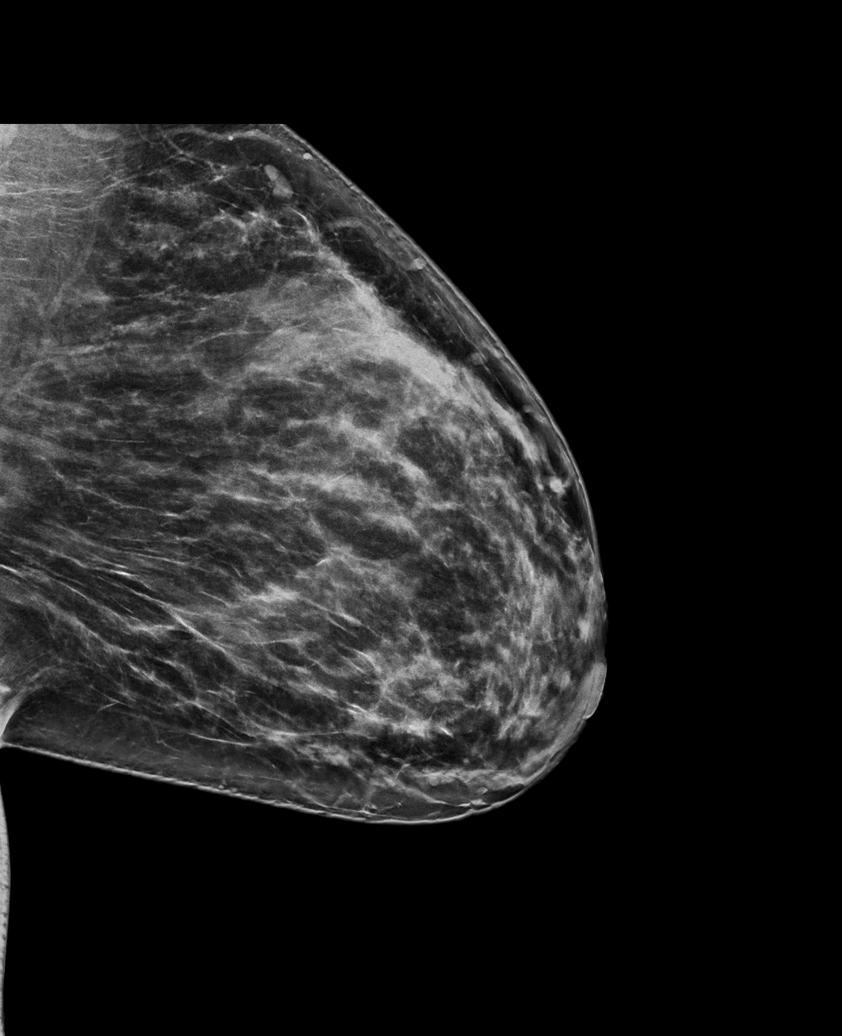

[R CC synth-2D]
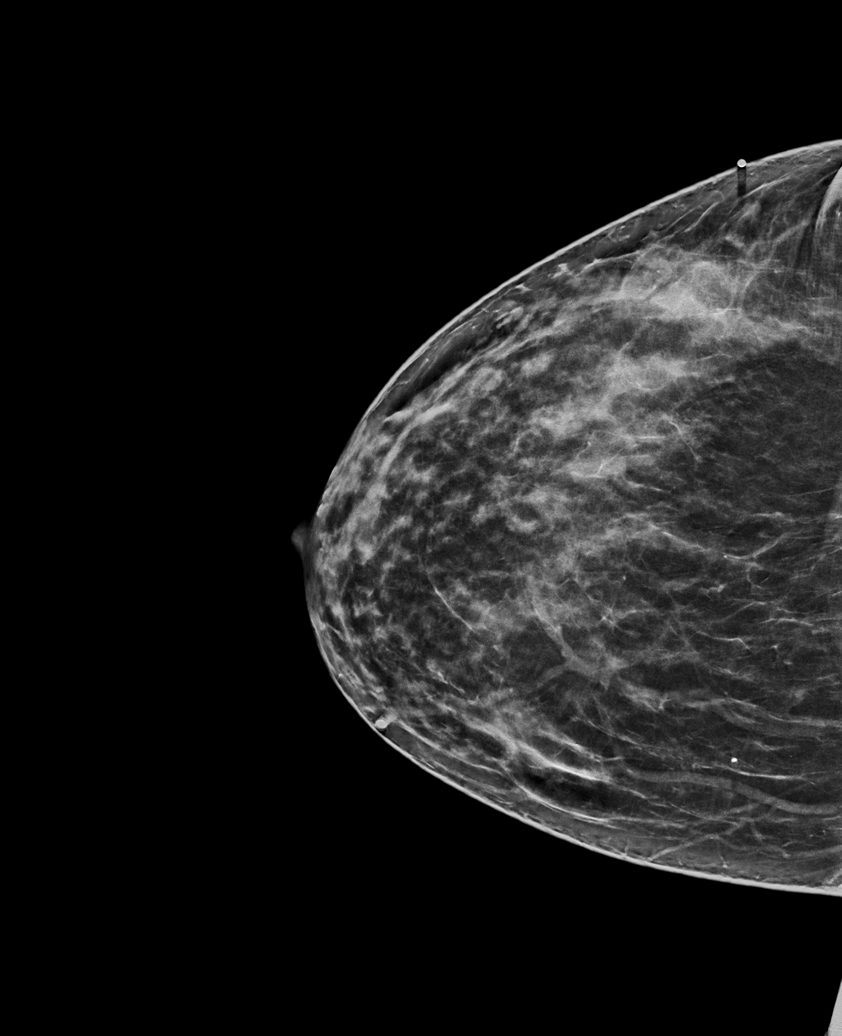

[R MLO synth-2D]
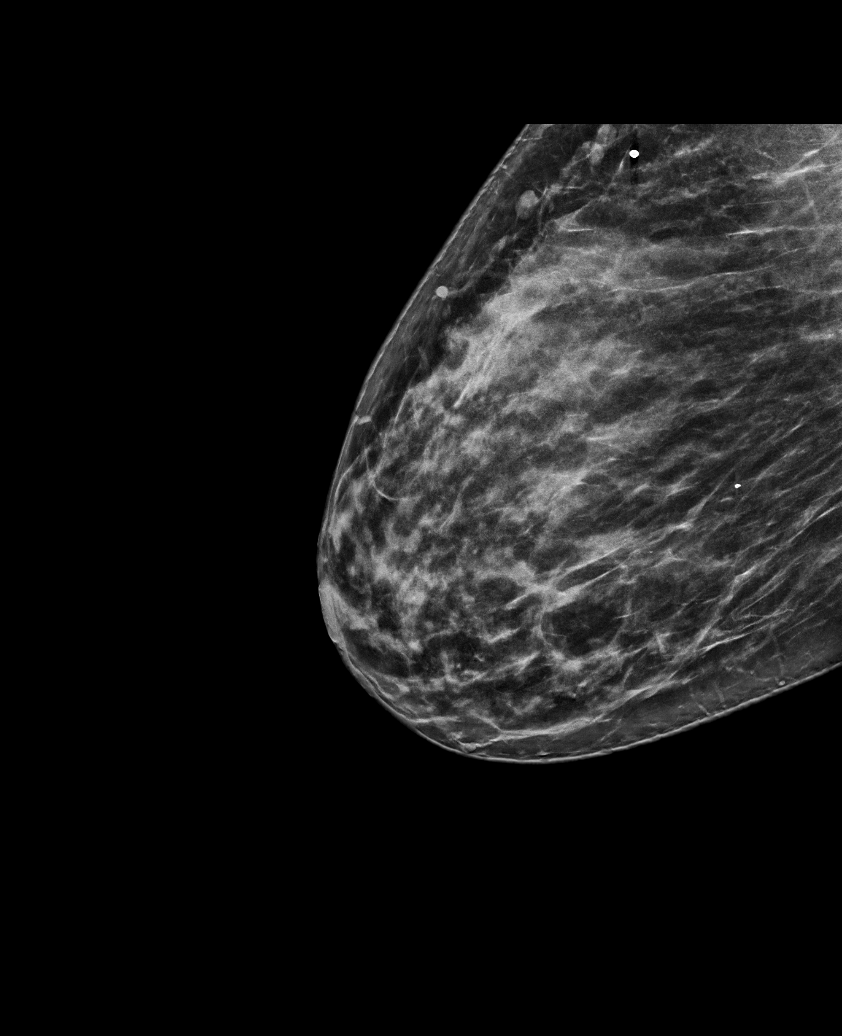

[L MLO tomo · tomo slice 41/81.0]
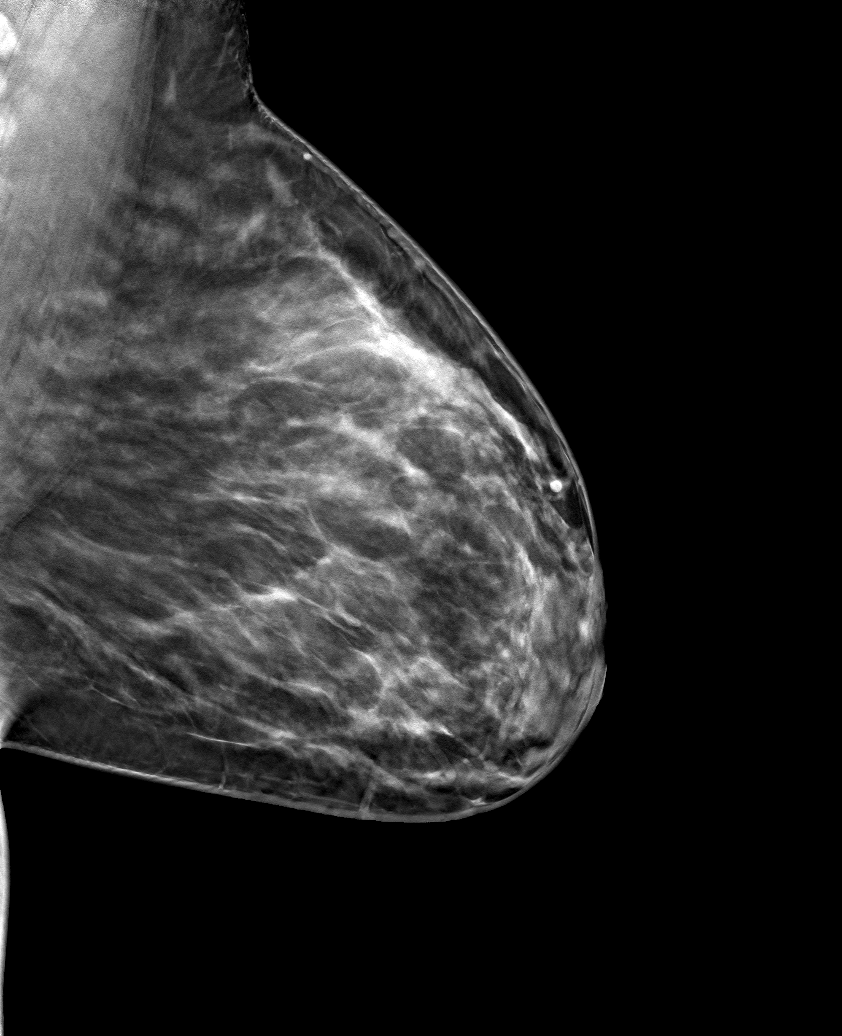

[6 of 30 positions shown; findings below may reference images not displayed]

ACR Breast Density Category c: The breast tissue is heterogeneously
dense, which may obscure small masses.
FINDINGS: There are multiple masses in the lateral aspect of the right breast.
No other suspicious mammographic findings seen bilaterally.

Mammographic images were processed with CAD.

On physical exam, no suspicious lumps are identified.

Targeted ultrasound is performed, showing no sonographic abnormality
in the region of the patient's focal pain. There are multiple in
intramammary nodes in the lateral right breast. There is a
hypoechoic circumscribed mass with parallel orientation and
increased through transmission at 9 o'clock, 3 cm from the nipple
measuring 8 x 4 x 8 mm today with no internal blood flow.
IMPRESSION: Probably benign right breast mass seen at 9 o'clock, 3 cm from the
nipple sonographically. This probably correlates with 1 of the
masses seen laterally mammographically. No suspicious findings at
the site of the patient's pain. No other suspicious findings in
either breast.

RECOMMENDATION:
Recommend annual mammography.

Recommend assessment of the patient's lifetime risk of breast cancer
given her strong family history. If her lifetime risk of breast
cancer is greater than 20%, recommend annual screening mammography
and breast MRI.

Also, the patient should be assessed for genetic testing given her
family history.

Finally, recommend six-month follow-up ultrasound and mammogram of
the patient's probably benign right breast mass.

I have discussed the findings and recommendations with the patient.
Results were also provided in writing at the conclusion of the
visit. If applicable, a reminder letter will be sent to the patient
regarding the next appointment.

BI-RADS CATEGORY  3: Probably benign.

## 2019-10-15 MED ORDER — MEGESTROL ACETATE 40 MG PO TABS: 40.0000 mg | ORAL_TABLET | Freq: Every day | ORAL | 5 refills | Status: DC

## 2019-10-15 NOTE — Progress Notes (Signed)
   Subjective:    Patient ID: Tiffany Hall, female    DOB: [birth year], 34 y.o.   MRN: [id number]  HPI Seen for vaginal bleeding for >2 weeks. Has been on Junel for the past 6 weeks (continuous OCP). Appears to be making the bleeding worse. Passing small clots. Was seen in ED at [facility] in October for several weeks of bleeding. Was prescribed provera, but never took it as the bleeding stopped shortly after being seen.   Has dizziness, fatigue.   I have reviewed the patients past medical, family, and social history.  I have reviewed the patient's medication list and allergies.   Review of Systems     Objective:   Physical Exam Exam conducted with a chaperone present.  Constitutional:      Appearance: Normal appearance.  HENT:     Right Ear: Tympanic membrane normal.     Left Ear: Tympanic membrane normal.  Genitourinary:    Labia:        Right: No rash, tenderness or lesion.        Left: No rash, tenderness or lesion.      Urethra: No prolapse, urethral pain, urethral swelling or urethral lesion.     Vagina: No signs of injury and foreign body. No vaginal discharge, erythema, tenderness or bleeding.     Cervix: No cervical motion tenderness, discharge, friability, lesion, erythema or eversion.  Neurological:     Mental Status: She is alert.        Assessment & Plan:  1. Abnormal uterine bleeding (AUB) No cervical source of bleeding. Will get CBC and [place]. Start megace in addition to OCP. Will call patient with results. F/u in 3 months, sooner if bleeding not getting under control. - CBC - US Pelvis Complete; Future - US Transvaginal Non-OB; Future  2. Other fatigue - TSH

## 2019-10-15 NOTE — Progress Notes (Signed)
Based on what you shared with me, I feel your condition warrants further evaluation and I recommend that you be seen for a face to face office visit. This is not something we treat/manage via e-visits. I would recommend that (1) you contact your PCP or GYN to discuss and (2) if you are feeling very fatigued or having any lightheadedness or shortness of breath, seek care at nearest Urgent Care or ER.     NOTE: If you entered your credit card information for this eVisit, you will not be charged. You may see a "hold" on your card for the $35 but that hold will drop off and you will not have a charge processed.   If you are having a true medical emergency please call 911.      For an urgent face to face visit, [facility] has five urgent care centers for your convenience:      NEW:  [facility] [place] at  Get Driving Directions [id number] [address], [place] [address] . 10 am - 6pm Monday - Friday    [facility]) Get Driving Directions [id number] [address] [place], [place] [address] . 10 am to 8 pm Monday-Friday . 12 pm to 8 pm [facility] Get Driving Directions [id number] [place], [place], [place] [address] . 8 am to 8 pm Monday-Friday . 9 am to 6 pm Saturday . 11 am to 6 pm Sunday     [facility] at [facility] Get Driving Directions  [id number] [address], [place] [address] . 8 am to 8 pm Monday-Friday . 8 am to 4 pm [facility] at [place] Get Driving Directions [id number] [address] [place], [place] [address] . 12 pm to 6 pm Monday-Friday      Your e-visit answers were reviewed by a board certified advanced clinical practitioner to complete your personal care plan.  Thank you for using e-Visits.

## 2019-10-15 NOTE — Progress Notes (Signed)
Patient states she has had bleeding for 11 days and some clots. Patient states she has been on continuous birth control before. Kathrene Alu RN

## 2019-10-17 LAB — CBC
Hematocrit: 33.7 % — ABNORMAL LOW (ref 34.0–46.6)
Hemoglobin: 11 g/dL — ABNORMAL LOW (ref 11.1–15.9)
MCH: 26.6 pg (ref 26.6–33.0)
MCHC: 32.6 g/dL (ref 31.5–35.7)
MCV: 81 fL (ref 79–97)
Platelets: 408 10*3/uL (ref 150–450)
RBC: 4.14 x10E6/uL (ref 3.77–5.28)
RDW: 14.4 % (ref 11.7–15.4)
WBC: 7 10*3/uL (ref 3.4–10.8)

## 2019-10-17 LAB — TSH: TSH: 1 u[IU]/mL (ref 0.450–4.500)

## 2019-10-20 ENCOUNTER — Ambulatory Visit (HOSPITAL_BASED_OUTPATIENT_CLINIC_OR_DEPARTMENT_OTHER): Admission: RE | Admit: 2019-10-20 | Payer: 59 | Source: Ambulatory Visit

## 2019-10-20 ENCOUNTER — Other Ambulatory Visit: Payer: Self-pay

## 2019-10-20 ENCOUNTER — Ambulatory Visit (HOSPITAL_BASED_OUTPATIENT_CLINIC_OR_DEPARTMENT_OTHER)
Admission: RE | Admit: 2019-10-20 | Discharge: 2019-10-20 | Disposition: A | Discharge status: Home / Self Care | Payer: 59 | Source: Ambulatory Visit | Attending: Family Medicine | Admitting: Family Medicine

## 2019-10-20 DIAGNOSIS — N939 Abnormal uterine and vaginal bleeding, unspecified: Secondary | ICD-10-CM | POA: Diagnosis not present

## 2019-11-30 ENCOUNTER — Other Ambulatory Visit: Payer: Medicare Other

## 2020-02-27 ENCOUNTER — Ambulatory Visit: Payer: Medicare Other

## 2020-05-25 ENCOUNTER — Inpatient Hospital Stay (HOSPITAL_COMMUNITY): Payer: Medicare Other

## 2020-05-25 ENCOUNTER — Other Ambulatory Visit: Payer: Self-pay

## 2020-05-25 ENCOUNTER — Encounter (HOSPITAL_COMMUNITY): Payer: Self-pay | Admitting: Obstetrics and Gynecology

## 2020-05-25 ENCOUNTER — Inpatient Hospital Stay (HOSPITAL_COMMUNITY)
Admission: AD | Admit: 2020-05-25 | Discharge: 2020-05-25 | Disposition: A | Discharge status: Home / Self Care | Payer: Medicare Other | Attending: Obstetrics and Gynecology | Admitting: Obstetrics and Gynecology

## 2020-05-25 DIAGNOSIS — F329 Major depressive disorder, single episode, unspecified: Secondary | ICD-10-CM | POA: Insufficient documentation

## 2020-05-25 DIAGNOSIS — Z79899 Other long term (current) drug therapy: Secondary | ICD-10-CM | POA: Diagnosis not present

## 2020-05-25 DIAGNOSIS — D649 Anemia, unspecified: Secondary | ICD-10-CM | POA: Insufficient documentation

## 2020-05-25 DIAGNOSIS — O99341 Other mental disorders complicating pregnancy, first trimester: Secondary | ICD-10-CM | POA: Insufficient documentation

## 2020-05-25 DIAGNOSIS — D573 Sickle-cell trait: Secondary | ICD-10-CM | POA: Diagnosis not present

## 2020-05-25 DIAGNOSIS — Z888 Allergy status to other drugs, medicaments and biological substances status: Secondary | ICD-10-CM | POA: Insufficient documentation

## 2020-05-25 DIAGNOSIS — Z881 Allergy status to other antibiotic agents status: Secondary | ICD-10-CM | POA: Diagnosis not present

## 2020-05-25 DIAGNOSIS — K219 Gastro-esophageal reflux disease without esophagitis: Secondary | ICD-10-CM | POA: Insufficient documentation

## 2020-05-25 DIAGNOSIS — Z7982 Long term (current) use of aspirin: Secondary | ICD-10-CM | POA: Diagnosis not present

## 2020-05-25 DIAGNOSIS — O3680X0 Pregnancy with inconclusive fetal viability, not applicable or unspecified: Secondary | ICD-10-CM

## 2020-05-25 DIAGNOSIS — Z3A01 Less than 8 weeks gestation of pregnancy: Secondary | ICD-10-CM

## 2020-05-25 DIAGNOSIS — O99611 Diseases of the digestive system complicating pregnancy, first trimester: Secondary | ICD-10-CM | POA: Diagnosis not present

## 2020-05-25 DIAGNOSIS — Z87891 Personal history of nicotine dependence: Secondary | ICD-10-CM | POA: Insufficient documentation

## 2020-05-25 DIAGNOSIS — R109 Unspecified abdominal pain: Secondary | ICD-10-CM | POA: Insufficient documentation

## 2020-05-25 DIAGNOSIS — O26891 Other specified pregnancy related conditions, first trimester: Secondary | ICD-10-CM | POA: Insufficient documentation

## 2020-05-25 DIAGNOSIS — O99011 Anemia complicating pregnancy, first trimester: Secondary | ICD-10-CM | POA: Insufficient documentation

## 2020-05-25 DIAGNOSIS — Z6790 Unspecified blood type, Rh positive: Secondary | ICD-10-CM

## 2020-05-25 DIAGNOSIS — F419 Anxiety disorder, unspecified: Secondary | ICD-10-CM | POA: Diagnosis not present

## 2020-05-25 LAB — CBC
HCT: 35 % — ABNORMAL LOW (ref 36.0–46.0)
Hemoglobin: 11.9 g/dL — ABNORMAL LOW (ref 12.0–15.0)
MCH: 25.8 pg — ABNORMAL LOW (ref 26.0–34.0)
MCHC: 34 g/dL (ref 30.0–36.0)
MCV: 75.9 fL — ABNORMAL LOW (ref 80.0–100.0)
Platelets: 421 10*3/uL — ABNORMAL HIGH (ref 150–400)
RBC: 4.61 MIL/uL (ref 3.87–5.11)
RDW: 14.1 % (ref 11.5–15.5)
WBC: 8 10*3/uL (ref 4.0–10.5)
nRBC: 0 % (ref 0.0–0.2)

## 2020-05-25 LAB — URINALYSIS, ROUTINE W REFLEX MICROSCOPIC
Bilirubin Urine: NEGATIVE
Glucose, UA: NEGATIVE mg/dL
Ketones, ur: 20 mg/dL — AB
Leukocytes,Ua: NEGATIVE
Nitrite: NEGATIVE
Protein, ur: NEGATIVE mg/dL
Specific Gravity, Urine: 1.018 (ref 1.005–1.030)
pH: 6 (ref 5.0–8.0)

## 2020-05-25 LAB — COMPREHENSIVE METABOLIC PANEL
ALT: 58 U/L — ABNORMAL HIGH (ref 0–44)
AST: 37 U/L (ref 15–41)
Albumin: 3.7 g/dL (ref 3.5–5.0)
Alkaline Phosphatase: 83 U/L (ref 38–126)
Anion gap: 10 (ref 5–15)
BUN: 8 mg/dL (ref 6–20)
CO2: 23 mmol/L (ref 22–32)
Calcium: 9.6 mg/dL (ref 8.9–10.3)
Chloride: 101 mmol/L (ref 98–111)
Creatinine, Ser: 0.56 mg/dL (ref 0.44–1.00)
GFR calc Af Amer: >60 mL/min (ref >60)
GFR calc non Af Amer: >60 mL/min (ref >60)
Glucose, Bld: 147 mg/dL — ABNORMAL HIGH (ref 70–99)
Potassium: 3.9 mmol/L (ref 3.5–5.1)
Sodium: 134 mmol/L — ABNORMAL LOW (ref 135–145)
Total Bilirubin: 0.6 mg/dL (ref 0.3–1.2)
Total Protein: 7.1 g/dL (ref 6.5–8.1)

## 2020-05-25 LAB — WET PREP, GENITAL
Clue Cells Wet Prep HPF POC: NONE SEEN
Sperm: NONE SEEN
Trich, Wet Prep: NONE SEEN
Yeast Wet Prep HPF POC: NONE SEEN

## 2020-05-25 LAB — HIV ANTIBODY (ROUTINE TESTING W REFLEX): HIV Screen 4th Generation wRfx: NONREACTIVE

## 2020-05-25 LAB — POCT PREGNANCY, URINE: Preg Test, Ur: POSITIVE — AB

## 2020-05-25 LAB — HCG, QUANTITATIVE, PREGNANCY: hCG, Beta Chain, Quant, S: 1682 m[IU]/mL — ABNORMAL HIGH (ref <5)

## 2020-05-25 NOTE — MAU Provider Note (Signed)
History     CSN: [id number]  Arrival date and time: 05/25/20 2130   First Provider Initiated Contact with Patient 05/25/20 1203      Chief Complaint  Patient presents with  . Abdominal Pain   Tiffany Hall is a 34 y.o. [id number] at [redacted]w[redacted]d who presents to MAU for lower abdominal cramping.  Onset:  4 weeks ago Location: across lower abdomen Duration: 4 weeks Character: intermittent, period-like cramping Aggravating/Associated: none/none Relieving: none Treatment: none Severity: 0/10, not present at this time  Pt denies VB, LOF, ctx, vaginal discharge/odor/itching. Pt denies N/V, constipation, diarrhea, or urinary problems. Pt denies fever, chills, fatigue, sweating or changes in appetite. Pt denies SOB or chest pain. Pt denies dizziness, HA, light-headedness, weakness.  Problems this pregnancy include: pt has not yet been seen. Allergies? AMOX, nubain Current medications/supplements? PNVs Prenatal care provider? [facility]   OB History    Gravida  8   Para  5   Term  5   Preterm  0   AB  1   Living  5     SAB  1   TAB  0   Ectopic  0   Multiple  0   Live Births  3           Past Medical History:  Diagnosis Date  . Anemia   . Anxiety   . Depression   . GERD (gastroesophageal reflux disease)    medication not needed  . Heart murmur   . History of abnormal cervical Pap smear    2009  . History of cardiac murmur as a child   . History of chlamydia    2008  . History of gallstones    2012  . History of gonorrhea    2008  . History of ovarian cyst   . History of trichomonal vaginitis    2008  . Hypertension    PIH with pregnancy  . Migraine headache   . Sickle cell trait ([place])   . Vaginal Pap smear, abnormal    HPV +    Past Surgical History:  Procedure Laterality Date  . COLPOSCOPY    . dilatation and currettage  N/A   . DILATION AND CURETTAGE OF UTERUS  2009   W/  SUCTION --  required blood transfusion  . DILATION AND  EVACUATION N/A 09/14/2015   Procedure: DILATATION AND EVACUATION;  Surgeon: Linda Hedges, DO;  Location: [facility];  Service: Gynecology;  Laterality: N/A;    Family History  Problem Relation Age of Onset  . Diabetes Mother   . Hypertension Mother   . Hepatitis Mother   . Cancer Mother 24       Breast, cervical  . Diabetes Maternal Grandmother   . Hypertension Maternal Grandmother   . Cancer Maternal Grandmother 40       Breast  . Anesthesia problems Neg Hx   . Other Neg Hx     Social History   Tobacco Use  . Smoking status: Former Smoker    Packs/day: 0.00    Types: Cigarettes    Quit date: 11/05/2010    Years since quitting: 9.5  . Smokeless tobacco: Never Used  . Tobacco comment: quit with preg  Substance Use Topics  . Alcohol use: No    Comment: occ  . Drug use: No    Types: MDMA (Ecstacy)    Comment: prior to this pregnancy in 2009    Allergies:  Allergies  Allergen Reactions  .  Amoxicillin Swelling    Did it involve swelling of the face/tongue/throat, SOB, or low BP? No Did it involve sudden or severe rash/hives, skin peeling, or any reaction on the inside of your mouth or nose? No Did you need to seek medical attention at a hospital or doctor's office? No When did it last happen?last year If all above answers are "NO", may proceed with cephalosporin use.   . Nubain [Nalbuphine Hcl] Swelling, Palpitations and Other (See Comments)    Pt states that she is able to take Percocet without any problems.       Medications Prior to Admission  Medication Sig Dispense Refill Last Dose  . Prenatal Vit-Fe Fumarate-FA (MULTIVITAMIN-PRENATAL) 27-0.8 MG TABS tablet Take 1 tablet by mouth daily at 12 noon.   05/24/2020 at Unknown time  . ALPRAZolam (XANAX) 0.5 MG tablet Take 0.5 mg by mouth 2 (two) times daily as needed for anxiety.     [name] aspirin-acetaminophen-caffeine (EXCEDRIN MIGRAINE) 250-250-65 MG tablet Take 2 tablets by mouth every 6 (six) hours as needed for  headache.     . escitalopram (LEXAPRO) 10 MG tablet Take 10 mg by mouth daily.     . medroxyPROGESTERone (PROVERA) 10 MG tablet Take 1 tablet (10 mg total) by mouth daily. (Patient not taking: Reported on 10/15/2019) 10 tablet 0   . megestrol (MEGACE) 40 MG tablet Take 1 tablet (40 mg total) by mouth daily. Can increase to two tablets twice a day in the event of heavy bleeding 60 tablet 5     Review of Systems  Constitutional: Negative for chills, diaphoresis, fatigue and fever.  Eyes: Negative for visual disturbance.  Respiratory: Negative for shortness of breath.   Cardiovascular: Negative for chest pain.  Gastrointestinal: Positive for abdominal pain (cramping). Negative for constipation, diarrhea, nausea and vomiting.  Genitourinary: Negative for dysuria, flank pain, frequency, pelvic pain, urgency, vaginal bleeding and vaginal discharge.  Neurological: Negative for dizziness, weakness, light-headedness and headaches.   Physical Exam   Blood pressure 127/71, pulse 81, temperature 98.7 F (37.1 C), temperature source Oral, resp. rate 16, height 5\' 4"  (1.626 m), weight 86.9 kg, last menstrual period 04/21/2020, SpO2 100 %.  Patient Vitals for the past 24 hrs:  BP Temp Temp src Pulse Resp SpO2 Height Weight  05/25/20 0954 127/71 98.7 F (37.1 C) Oral 81 16 100 % 5\' 4"  (1.626 m) 86.9 kg   Physical Exam Constitutional:      General: She is not in acute distress.    Appearance: She is well-developed. She is not ill-appearing, toxic-appearing or diaphoretic.  HENT:     Head: Normocephalic and atraumatic.  Pulmonary:     Effort: Pulmonary effort is normal.  Abdominal:     General: There is no distension.     Palpations: Abdomen is soft. There is no mass.     Tenderness: There is no abdominal tenderness. There is no guarding or rebound.  Skin:    General: Skin is warm and dry.  Neurological:     Mental Status: She is alert and oriented to person, place, and time.  Psychiatric:         Behavior: Behavior normal.        Thought Content: Thought content normal.        Judgment: Judgment normal.    Results for orders placed or performed during the hospital encounter of 05/25/20 (from the past 24 hour(s))  Pregnancy, urine POC     Status: Abnormal   Collection Time: 05/25/20  10:09 AM  Result Value Ref Range   Preg Test, Ur POSITIVE (A) NEGATIVE  Urinalysis, Routine w reflex microscopic Urine, Clean Catch     Status: Abnormal   Collection Time: 05/25/20 10:17 AM  Result Value Ref Range   Color, Urine YELLOW YELLOW   APPearance CLEAR CLEAR   Specific Gravity, Urine 1.018 1.005 - 1.030   pH 6.0 5.0 - 8.0   Glucose, UA NEGATIVE NEGATIVE mg/dL   Hgb urine dipstick SMALL (A) NEGATIVE   Bilirubin Urine NEGATIVE NEGATIVE   Ketones, ur 20 (A) NEGATIVE mg/dL   Protein, ur NEGATIVE NEGATIVE mg/dL   Nitrite NEGATIVE NEGATIVE   Leukocytes,Ua NEGATIVE NEGATIVE   RBC / HPF 0-5 0 - 5 RBC/hpf   WBC, UA 0-5 0 - 5 WBC/hpf   Bacteria, UA RARE (A) NONE SEEN   Squamous Epithelial / LPF 0-5 0 - 5   Mucus PRESENT   CBC     Status: Abnormal   Collection Time: 05/25/20 10:41 AM  Result Value Ref Range   WBC 8.0 4.0 - 10.5 K/uL   RBC 4.61 3.87 - 5.11 MIL/uL   Hemoglobin 11.9 (L) 12.0 - 15.0 g/dL   HCT 35.0 (L) 36 - 46 %   MCV 75.9 (L) 80.0 - 100.0 fL   MCH 25.8 (L) 26.0 - 34.0 pg   MCHC 34.0 30.0 - 36.0 g/dL   RDW 14.1 11.5 - 15.5 %   Platelets 421 (H) 150 - 400 K/uL   nRBC 0.0 0.0 - 0.2 %  Comprehensive metabolic panel     Status: Abnormal   Collection Time: 05/25/20 10:41 AM  Result Value Ref Range   Sodium 134 (L) 135 - 145 mmol/L   Potassium 3.9 3.5 - 5.1 mmol/L   Chloride 101 98 - 111 mmol/L   CO2 23 22 - 32 mmol/L   Glucose, Bld 147 (H) 70 - 99 mg/dL   BUN 8 6 - 20 mg/dL   Creatinine, Ser 0.56 0.44 - 1.00 mg/dL   Calcium 9.6 8.9 - 10.3 mg/dL   Total Protein 7.1 6.5 - 8.1 g/dL   Albumin 3.7 3.5 - 5.0 g/dL   AST 37 15 - 41 U/L   ALT 58 (H) 0 - 44 U/L   Alkaline  Phosphatase 83 38 - 126 U/L   Total Bilirubin 0.6 0.3 - 1.2 mg/dL   GFR calc non Af Amer >60 >60 mL/min   GFR calc Af Amer >60 >60 mL/min   Anion gap 10 5 - 15  hCG, quantitative, pregnancy     Status: Abnormal   Collection Time: 05/25/20 10:41 AM  Result Value Ref Range   hCG, Beta Chain, Quant, S 1,682 (H) <5 mIU/mL  HIV Antibody (routine testing w rflx)     Status: None   Collection Time: 05/25/20 10:41 AM  Result Value Ref Range   HIV Screen 4th Generation wRfx Non Reactive Non Reactive  Wet prep, genital     Status: Abnormal   Collection Time: 05/25/20 11:10 AM   Specimen: PATH Cytology Cervicovaginal Ancillary Only  Result Value Ref Range   Yeast Wet Prep HPF POC NONE SEEN NONE SEEN   Trich, Wet Prep NONE SEEN NONE SEEN   Clue Cells Wet Prep HPF POC NONE SEEN NONE SEEN   WBC, Wet Prep HPF POC MANY (A) NONE SEEN   Sperm NONE SEEN    US OB Comp AddL Gest Less 14 Wks  Result Date: 05/25/2020 CLINICAL DATA:  Abdominal cramping, pregnant  EXAM: TWIN OBSTETRIC <14WK [place] AND TRANSVAGINAL OB US COMPARISON:  None FINDINGS: Number of IUPs:  2 Chorionicity/Amnionicity:  Dichorionic-diamniotic (thick membrane) TWIN 1 Yolk sac:  Absent Embryo:  Absent Cardiac Activity: N/A Heart Rate: N/A bpm MSD: 2.2 mm   4 w   6 d CRL:    mm    w  d                  [place] EDC: TWIN 2 Yolk sac:  Absent Embryo:  Absent Cardiac Activity: N/A Heart Rate: N/A bpm MSD: 2.7 mm   4 w   6 d CRL:    mm    w  d                  [place] EDC: Subchorionic hemorrhage:  None visualized. Maternal uterus/adnexae: Uterus anteverted, otherwise normal appearance. Minimal endometrial fluid. LEFT ovary normal size and morphology 1.7 x 3.1 x 1.3 cm. RIGHT ovary measures 3.2 x 2.2 x 2.4 cm and contains a corpus luteal cyst. No free pelvic fluid or adnexal masses. IMPRESSION: Twin intrauterine gestational sacs as above. Electronically Signed   By: Lavonia Dana M.D.   On: 05/25/2020 12:14   US OB LESS THAN 14 WEEKS WITH OB TRANSVAGINAL  Result  Date: 05/25/2020 CLINICAL DATA:  Abdominal cramping, pregnant EXAM: TWIN OBSTETRIC <14WK [place] AND TRANSVAGINAL OB US COMPARISON:  None FINDINGS: Number of IUPs:  2 Chorionicity/Amnionicity:  Dichorionic-diamniotic (thick membrane) TWIN 1 Yolk sac:  Absent Embryo:  Absent Cardiac Activity: N/A Heart Rate: N/A bpm MSD: 2.2 mm   4 w   6 d CRL:    mm    w  d                  [place] EDC: TWIN 2 Yolk sac:  Absent Embryo:  Absent Cardiac Activity: N/A Heart Rate: N/A bpm MSD: 2.7 mm   4 w   6 d CRL:    mm    w  d                  [place] EDC: Subchorionic hemorrhage:  None visualized. Maternal uterus/adnexae: Uterus anteverted, otherwise normal appearance. Minimal endometrial fluid. LEFT ovary normal size and morphology 1.7 x 3.1 x 1.3 cm. RIGHT ovary measures 3.2 x 2.2 x 2.4 cm and contains a corpus luteal cyst. No free pelvic fluid or adnexal masses. IMPRESSION: Twin intrauterine gestational sacs as above. Electronically Signed   By: Lavonia Dana M.D.   On: 05/25/2020 12:14    MAU Course  Procedures  MDM -r/o ectopic -UA: sm hgb/20ketones/rare bacteria, sending urine for culture -CBC: platelets 421, per pt has a history or elevated platelets -CMP: ALT 58, per pt has history of elevated liver enzymes -[place]: twin intrauterine gestational sacs (dichorionic-diamniotic) without yolk sacs or fetal pole, minimal endometrial fluid, no free pelvic fluid or adnexal mass -hCG: 1,682 -ABO: O Positive -WetPrep: WNL -GC/CT/HIV collected -consulted with Dr. Rip Harbour re: no yolk sac/fetal pole, platelets 421, ALT 58, pt OK to be discharged home with repeat US in 10-14days, pt does not need repeat hCG on Friday -pt discharged to home in stable condition  Orders Placed This Encounter  Procedures  . Wet prep, genital    Standing Status:   Standing    Number of Occurrences:   1  . Culture, OB Urine    Standing Status:   Standing    Number of Occurrences:   1  .  US OB LESS THAN 14 WEEKS WITH OB TRANSVAGINAL    Standing Status:    Standing    Number of Occurrences:   1    Order Specific Question:   Symptom/Reason for Exam    Answer:   Abdominal cramping [[id number]]  . US OB Comp AddL Gest Less 14 Wks    Standing Status:   Standing    Number of Occurrences:   1    Order Specific Question:   Symptom/Reason for Exam    Answer:   Abdominal cramping [[id number]]  . US OB LESS THAN 14 WEEKS WITH OB TRANSVAGINAL    Please schedule patient for follow-up US, Monday - Thursday between 8:00 am - 10:00 am and 12:00 pm - 3:00 pm. The patient will follow-up with [facility] immediately after [place] for results.    Standing Status:   Future    Standing Expiration Date:   05/25/2021    Order Specific Question:   Reason for Exam (SYMPTOM  OR DIAGNOSIS REQUIRED)    Answer:   viability, pregnancy of unknown location    Order Specific Question:   Preferred Imaging Location?    Answer:   [facility]  . Urinalysis, Routine w reflex microscopic Urine, Clean Catch    Standing Status:   Standing    Number of Occurrences:   1  . CBC    Standing Status:   Standing    Number of Occurrences:   1  . Comprehensive metabolic panel    Standing Status:   Standing    Number of Occurrences:   1  . hCG, quantitative, pregnancy    Standing Status:   Standing    Number of Occurrences:   1  . HIV Antibody (routine testing w rflx)    Standing Status:   Standing    Number of Occurrences:   1  . Pregnancy, urine POC    Standing Status:   Standing    Number of Occurrences:   1  . Discharge patient    Order Specific Question:   Discharge disposition    Answer:   01-Home or Self Care [1]    Order Specific Question:   Discharge patient date    Answer:   05/25/2020   No orders of the defined types were placed in this encounter.   Assessment and Plan   1. Pregnancy of unknown anatomic location   2. Abdominal cramping   3. Blood type, Rh positive     Allergies as of 05/25/2020      Reactions   Amoxicillin Swelling   Did it involve swelling of the  face/tongue/throat, SOB, or low BP? No Did it involve sudden or severe rash/hives, skin peeling, or any reaction on the inside of your mouth or nose? No Did you need to seek medical attention at a hospital or doctor's office? No When did it last happen?last year If all above answers are "NO", may proceed with cephalosporin use.   Nubain [nalbuphine Hcl] Swelling, Palpitations, Other (See Comments)   Pt states that she is able to take Percocet without any problems.        Medication List    STOP taking these medications   aspirin-acetaminophen-caffeine 250-250-65 MG tablet Commonly known as: EXCEDRIN MIGRAINE   medroxyPROGESTERone 10 MG tablet Commonly known as: PROVERA   megestrol 40 MG tablet Commonly known as: MEGACE     TAKE these medications   ALPRAZolam 0.5 MG tablet Commonly known as: XANAX Take 0.5 mg by mouth  2 (two) times daily as needed for anxiety.   escitalopram 10 MG tablet Commonly known as: LEXAPRO Take 10 mg by mouth daily.   multivitamin-prenatal 27-0.8 MG Tabs tablet Take 1 tablet by mouth daily at 12 noon.       -will call with culture results, if positive -safe meds in pregnancy list given -discussed ectopic vs. SAB vs. miscarriage -strict ectopic precautions given -return MAU precautions -order placed for repeat US in 2 weeks, pt aware to expect call -pt discharged to home in stable condition  Elmyra Ricks E Zanyla Klebba 05/25/2020, 12:46 PM

## 2020-05-25 NOTE — MAU Note (Signed)
Was having a lot of cramping last night, couldn't sleep.  Has been cramping off and on for last 4 wks. Called [facility] was told to come here. preg has not been confirmed. +HPT.  Denies bleeding.

## 2020-05-25 NOTE — Discharge Instructions (Signed)
Safe Medications in Pregnancy    Acne: Benzoyl Peroxide Salicylic Acid  Backache/Headache: Tylenol: 2 regular strength every 4 hours OR              2 Extra strength every 6 hours  Colds/Coughs/Allergies: Benadryl (alcohol free) 25 mg every 6 hours as needed Breath right strips Claritin Cepacol throat lozenges Chloraseptic throat spray Cold-Eeze- up to three times per day Cough drops, alcohol free Flonase (by prescription only) Guaifenesin Mucinex Robitussin DM (plain only, alcohol free) Saline nasal spray/drops Sudafed (pseudoephedrine) & Actifed ** use only after [redacted] weeks gestation and if you do not have high blood pressure Tylenol Vicks Vaporub Zinc lozenges Zyrtec   Constipation: Colace Ducolax suppositories Fleet enema Glycerin suppositories Metamucil Milk of magnesia Miralax Senokot Smooth move tea  Diarrhea: Kaopectate Imodium A-D  *NO pepto Bismol  Hemorrhoids: Anusol Anusol HC Preparation H Tucks  Indigestion: Tums Maalox Mylanta Zantac  Pepcid  Insomnia: Benadryl (alcohol free) 25mg  every 6 hours as needed Tylenol PM Unisom, no Gelcaps  Leg Cramps: Tums MagGel  Nausea/Vomiting:  Bonine Dramamine Emetrol Ginger extract Sea bands Meclizine  Nausea medication to take during pregnancy:  Unisom (doxylamine succinate 25 mg tablets) Take one tablet daily at bedtime. If symptoms are not adequately controlled, the dose can be increased to a maximum recommended dose of two tablets daily (1/2 tablet in the morning, 1/2 tablet mid-afternoon and one at bedtime). Vitamin B6 100mg  tablets. Take one tablet twice a day (up to 200 mg per day).  Skin Rashes: Aveeno products Benadryl cream or 25mg  every 6 hours as needed Calamine Lotion 1% cortisone cream  Yeast infection: Gyne-lotrimin 7 Monistat 7   **If taking multiple medications, please check labels to avoid duplicating the same active ingredients **take  medication as directed on the label ** Do not exceed 4000 mg of tylenol in 24 hours **Do not take medications that contain aspirin or ibuprofen           Ectopic Pregnancy  An ectopic pregnancy is when the fertilized egg attaches (implants) outside the uterus. Most ectopic pregnancies occur in one of the tubes where eggs travel from the ovary to the uterus (fallopian tubes), but the implanting can occur in other locations. In rare cases, ectopic pregnancies occur on the ovary, intestine, pelvis, abdomen, or cervix. In an ectopic pregnancy, the fertilized egg does not have the ability to develop into a normal, healthy baby. A ruptured ectopic pregnancy is one in which tearing or bursting of a fallopian tube causes internal bleeding. Often, there is intense lower abdominal pain, and vaginal bleeding sometimes occurs. Having an ectopic pregnancy can be life-threatening. If this dangerous condition is not treated, it can lead to blood loss, shock, or even death. What are the causes? The most common cause of this condition is damage to one of the fallopian tubes. A fallopian tube may be narrowed or blocked, and that keeps the fertilized egg from reaching the uterus. What increases the risk? This condition is more likely to develop in women of childbearing age who have different levels of risk. The levels of risk can be divided into three categories. High risk  You have gone through infertility treatment.  You have had an ectopic pregnancy before.  You have had surgery on the fallopian tubes, or another surgical procedure, such as an abortion.  You have had surgery to have the fallopian tubes tied (tubal ligation).  You have problems or diseases of the fallopian tubes.  You have been  exposed to diethylstilbestrol (DES). This medicine was used until 1971, and it had effects on babies whose mothers took the medicine.  You become pregnant while using an IUD (intrauterine device) for birth  control. Moderate risk  You have a history of infertility.  You have had an STI (sexually transmitted infection).  You have a history of pelvic inflammatory disease (PID).  You have scarring from endometriosis.  You have multiple sexual partners.  You smoke. Low risk  You have had pelvic surgery.  You use vaginal douches.  You became sexually active before age 79. What are the signs or symptoms? Common symptoms of this condition include normal pregnancy symptoms, such as missing a period, nausea, tiredness, abdominal pain, breast tenderness, and bleeding. However, ectopic pregnancy will have additional symptoms, such as:  Pain with intercourse.  Irregular vaginal bleeding or spotting.  Cramping or pain on one side or in the lower abdomen.  Fast heartbeat, low blood pressure, and sweating.  Passing out while having a bowel movement. Symptoms of a ruptured ectopic pregnancy and internal bleeding may include:  Sudden, severe pain in the abdomen and pelvis.  Dizziness, weakness, light-headedness, or fainting.  Pain in the shoulder or neck area. How is this diagnosed? This condition is diagnosed by:  A pelvic exam to locate pain or a mass in the abdomen.  A pregnancy test. This blood test checks for the presence as well as the specific level of pregnancy hormone in the bloodstream.  Ultrasound. This is performed if a pregnancy test is positive. In this test, a probe is inserted into the vagina. The probe will detect a fetus, possibly in a location other than the uterus.  Taking a sample of uterus tissue (dilation and curettage, or D&C).  Surgery to perform a visual exam of the inside of the abdomen using a thin, lighted tube that has a tiny camera on the end (laparoscope).  Culdocentesis. This procedure involves inserting a needle at the top of the vagina, behind the uterus. If blood is present in this area, it may indicate that a fallopian tube is torn. How is this  treated? This condition is treated with medicine or surgery. Medicine  An injection of a medicine (methotrexate) may be given to cause the pregnancy tissue to be absorbed. This medicine may save your fallopian tube. It may be given if: ? The diagnosis is made early, with no signs of active bleeding. ? The fallopian tube has not ruptured. ? You are considered to be a good candidate for the medicine. Usually, pregnancy hormone blood levels are checked after methotrexate treatment. This is to be sure that the medicine is effective. It may take 4-6 weeks for the pregnancy to be absorbed. Most pregnancies will be absorbed by 3 weeks. Surgery  A laparoscope may be used to remove the pregnancy tissue.  If severe internal bleeding occurs, a larger cut (incision) may be made in the lower abdomen (laparotomy) to remove the fetus and placenta. This is done to stop the bleeding.  Part or all of the fallopian tube may be removed (salpingectomy) along with the fetus and placenta. The fallopian tube may also be repaired during the surgery.  In very rare circumstances, removal of the uterus (hysterectomy) may be required.  After surgery, pregnancy hormone testing may be done to be sure that there is no pregnancy tissue left. Whether your treatment is medicine or surgery, you may receive a Rho (D) immune globulin shot to prevent problems with any future pregnancy.  This shot may be given if:  You are Rh-negative and the baby's father is Rh-positive.  You are Rh-negative and you do not know the Rh type of the baby's father. Follow these instructions at home:  Rest and limit your activity after the procedure for as long as told by your health care provider.  Until your health care provider says that it is safe: ? Do not lift anything that is heavier than 10 lb (4.5 kg), or the limit that your health care provider tells you. ? Avoid physical exercise and any movement that requires effort (is  strenuous).  To help prevent constipation: ? Eat a healthy diet that includes fruits, vegetables, and whole grains. ? Drink 6-8 glasses of water per day. Get help right away if:  You develop worsening pain that is not relieved by medicine.  You have: ? A fever or chills. ? Vaginal bleeding. ? Redness and swelling at the incision site. ? Nausea and vomiting.  You feel dizzy or weak.  You feel light-headed or you faint. This information is not intended to replace advice given to you by your health care provider. Make sure you discuss any questions you have with your health care provider. Document Revised: 09/06/2017 Document Reviewed: 04/25/2016 Elsevier Patient Education  [place] A miscarriage is the loss of an unborn baby (fetus) before the 20th week of pregnancy. Most miscarriages happen during the first 3 months of pregnancy. Sometimes, a miscarriage can happen before a woman knows that she is pregnant. Having a miscarriage can be an emotional experience. If you have had a miscarriage, talk with your health care provider about any questions you may have about miscarrying, the grieving process, and your plans for future pregnancy. What are the causes? A miscarriage may be caused by:  Problems with the genes or chromosomes of the fetus. These problems make it impossible for the baby to develop normally. They are often the result of random errors that occur early in the development of the baby, and are not passed from parent to child (not inherited).  Infection of the cervix or uterus.  Conditions that affect hormone balance in the body.  Problems with the cervix, such as the cervix opening and thinning before pregnancy is at term (cervical insufficiency).  Problems with the uterus. These may include: ? A uterus with an abnormal shape. ? Fibroids in the uterus. ? Congenital abnormalities. These are problems that were present at  birth.  Certain medical conditions.  Smoking, drinking alcohol, or using drugs.  Injury (trauma). In many cases, the cause of a miscarriage is not known. What are the signs or symptoms? Symptoms of this condition include:  Vaginal bleeding or spotting, with or without cramps or pain.  Pain or cramping in the abdomen or lower back.  Passing fluid, tissue, or blood clots from the vagina. How is this diagnosed? This condition may be diagnosed based on:  A physical exam.  Ultrasound.  Blood tests.  Urine tests. How is this treated? Treatment for a miscarriage is sometimes not necessary if you naturally pass all the tissue that was in your uterus. If necessary, this condition may be treated with:  Dilation and curettage (D&C). This is a procedure in which the cervix is stretched open and the lining of the uterus (endometrium) is scraped. This is done only if tissue from the fetus or placenta remains in the body (incomplete miscarriage).  Medicines, such as: ?  Antibiotic medicine, to treat infection. ? Medicine to help the body pass any remaining tissue. ? Medicine to reduce (contract) the size of the uterus. These medicines may be given if you have a lot of bleeding. If you have Rh negative blood and your baby was Rh positive, you will need a shot of a medicine called Rh immunoglobulinto protect your future babies from Rh blood problems. "Rh-negative" and "Rh-positive" refer to whether or not the blood has a specific protein found on the surface of red blood cells (Rh factor). Follow these instructions at home: Medicines   Take over-the-counter and prescription medicines only as told by your health care provider.  If you were prescribed antibiotic medicine, take it as told by your health care provider. Do not stop taking the antibiotic even if you start to feel better.  Do not take NSAIDs, such as aspirin and ibuprofen, unless they are approved by your health care provider.  These medicines can cause bleeding. Activity  Rest as directed. Ask your health care provider what activities are safe for you.  Have someone help with home and family responsibilities during this time. General instructions  Keep track of the number of sanitary pads you use each day and how soaked (saturated) they are. Write down this information.  Monitor the amount of tissue or blood clots that you pass from your vagina. Save any large amounts of tissue for your health care provider to examine.  Do not use tampons, douche, or have sex until your health care provider approves.  To help you and your partner with the process of grieving, talk with your health care provider or seek counseling.  When you are ready, meet with your health care provider to discuss any important steps you should take for your health. Also, discuss steps you should take to have a healthy pregnancy in the future.  Keep all follow-up visits as told by your health care provider. This is important. Where to find more information  The American Congress of Obstetricians and Gynecologists: www.acog.org  [place] Department of Health and [employer], systems of Womens Health: [place] Contact a health care provider if:  You have a fever or chills.  You have a foul smelling vaginal discharge.  You have more bleeding instead of less. Get help right away if:  You have severe cramps or pain in your back or abdomen.  You pass blood clots or tissue from your vagina that is walnut-sized or larger.  You soak more than 1 regular sanitary pad in an hour.  You become light-headed or weak.  You pass out.  You have feelings of sadness that take over your thoughts, or you have thoughts of hurting yourself. Summary  Most miscarriages happen in the first 3 months of pregnancy. Sometimes miscarriage happens before a woman even knows that she is pregnant.  Follow your health care provider's instruction for  home care. Keep all follow-up appointments.  To help you and your partner with the process of grieving, talk with your health care provider or seek counseling. This information is not intended to replace advice given to you by your health care provider. Make sure you discuss any questions you have with your health care provider. Document Revised: 01/16/2019 Document Reviewed: 10/30/2016 Elsevier Patient Education  [place] of Pregnancy The first trimester of pregnancy is from week 1 until the end of week 13 (months 1 through 3). A week after a  sperm fertilizes an egg, the egg will implant on the wall of the uterus. This embryo will begin to develop into a baby. Genes from you and your partner will form the baby. The female genes will determine whether the baby will be a boy or a girl. At 6-8 weeks, the eyes and face will be formed, and the heartbeat can be seen on ultrasound. At the end of 12 weeks, all the baby's organs will be formed. Now that you are pregnant, you will want to do everything you can to have a healthy baby. Two of the most important things are to get good prenatal care and to follow your health care provider's instructions. Prenatal care is all the medical care you receive before the baby's birth. This care will help prevent, find, and treat any problems during the pregnancy and childbirth. Body changes during your first trimester Your body goes through many changes during pregnancy. The changes vary from woman to woman.  You may gain or lose a couple of pounds at first.  You may feel sick to your stomach (nauseous) and you may throw up (vomit). If the vomiting is uncontrollable, call your health care provider.  You may tire easily.  You may develop headaches that can be relieved by medicines. All medicines should be approved by your health care provider.  You may urinate more often. Painful urination may mean you have a bladder  infection.  You may develop heartburn as a result of your pregnancy.  You may develop constipation because certain hormones are causing the muscles that push stool through your intestines to slow down.  You may develop hemorrhoids or swollen veins (varicose veins).  Your breasts may begin to grow larger and become tender. Your nipples may stick out more, and the tissue that surrounds them (areola) may become darker.  Your gums may bleed and may be sensitive to brushing and flossing.  Dark spots or blotches (chloasma, mask of pregnancy) may develop on your face. This will likely fade after the baby is born.  Your menstrual periods will stop.  You may have a loss of appetite.  You may develop cravings for certain kinds of food.  You may have changes in your emotions from day to day, such as being excited to be pregnant or being concerned that something may go wrong with the pregnancy and baby.  You may have more vivid and strange dreams.  You may have changes in your hair. These can include thickening of your hair, rapid growth, and changes in texture. Some women also have hair loss during or after pregnancy, or hair that feels dry or thin. Your hair will most likely return to normal after your baby is born. What to expect at prenatal visits During a routine prenatal visit:  You will be weighed to make sure you and the baby are growing normally.  Your blood pressure will be taken.  Your abdomen will be measured to track your baby's growth.  The fetal heartbeat will be listened to between weeks 10 and 14 of your pregnancy.  Test results from any previous visits will be discussed. Your health care provider may ask you:  How you are feeling.  If you are feeling the baby move.  If you have had any abnormal symptoms, such as leaking fluid, bleeding, severe headaches, or abdominal cramping.  If you are using any tobacco products, including cigarettes, chewing tobacco, and  electronic cigarettes.  If you have any questions. Other tests that may  be performed during your first trimester include:  Blood tests to find your blood type and to check for the presence of any previous infections. The tests will also be used to check for low iron levels (anemia) and protein on red blood cells (Rh antibodies). Depending on your risk factors, or if you previously had diabetes during pregnancy, you may have tests to check for high blood sugar that affects pregnant women (gestational diabetes).  Urine tests to check for infections, diabetes, or protein in the urine.  An ultrasound to confirm the proper growth and development of the baby.  Fetal screens for spinal cord problems (spina bifida) and Down syndrome.  HIV (human immunodeficiency virus) testing. Routine prenatal testing includes screening for HIV, unless you choose not to have this test.  You may need other tests to make sure you and the baby are doing well. Follow these instructions at home: Medicines  Follow your health care provider's instructions regarding medicine use. Specific medicines may be either safe or unsafe to take during pregnancy.  Take a prenatal vitamin that contains at least 600 micrograms (mcg) of folic acid.  If you develop constipation, try taking a stool softener if your health care provider approves. Eating and drinking   Eat a balanced diet that includes fresh fruits and vegetables, whole grains, good sources of protein such as meat, eggs, or tofu, and low-fat dairy. Your health care provider will help you determine the amount of weight gain that is right for you.  Avoid raw meat and uncooked cheese. These carry germs that can cause birth defects in the baby.  Eating four or five small meals rather than three large meals a day may help relieve nausea and vomiting. If you start to feel nauseous, eating a few soda crackers can be helpful. Drinking liquids between meals, instead of during  meals, also seems to help ease nausea and vomiting.  Limit foods that are high in fat and processed sugars, such as fried and sweet foods.  To prevent constipation: ? Eat foods that are high in fiber, such as fresh fruits and vegetables, whole grains, and beans. ? Drink enough fluid to keep your urine clear or pale yellow. Activity  Exercise only as directed by your health care provider. Most women can continue their usual exercise routine during pregnancy. Try to exercise for 30 minutes at least 5 days a week. Exercising will help you: ? Control your weight. ? Stay in shape. ? Be prepared for labor and delivery.  Experiencing pain or cramping in the lower abdomen or lower back is a good sign that you should stop exercising. Check with your health care provider before continuing with normal exercises.  Try to avoid standing for long periods of time. Move your legs often if you must stand in one place for a long time.  Avoid heavy lifting.  Wear low-heeled shoes and practice good posture.  You may continue to have sex unless your health care provider tells you not to. Relieving pain and discomfort  Wear a good support bra to relieve breast tenderness.  Take warm sitz baths to soothe any pain or discomfort caused by hemorrhoids. Use hemorrhoid cream if your health care provider approves.  Rest with your legs elevated if you have leg cramps or low back pain.  If you develop varicose veins in your legs, wear support hose. Elevate your feet for 15 minutes, 3-4 times a day. Limit salt in your diet. Prenatal care  Schedule your prenatal visits  by the twelfth week of pregnancy. They are usually scheduled monthly at first, then more often in the last 2 months before delivery.  Write down your questions. Take them to your prenatal visits.  Keep all your prenatal visits as told by your health care provider. This is important. Safety  Wear your seat belt at all times when  driving.  Make a list of emergency phone numbers, including numbers for family, friends, the hospital, and police and fire departments. General instructions  Ask your health care provider for a referral to a local prenatal education class. Begin classes no later than the beginning of month 6 of your pregnancy.  Ask for help if you have counseling or nutritional needs during pregnancy. Your health care provider can offer advice or refer you to specialists for help with various needs.  Do not use hot tubs, steam rooms, or saunas.  Do not douche or use tampons or scented sanitary pads.  Do not cross your legs for long periods of time.  Avoid cat litter boxes and soil used by cats. These carry germs that can cause birth defects in the baby and possibly loss of the fetus by miscarriage or stillbirth.  Avoid all smoking, herbs, alcohol, and medicines not prescribed by your health care provider. Chemicals in these products affect the formation and growth of the baby.  Do not use any products that contain nicotine or tobacco, such as cigarettes and e-cigarettes. If you need help quitting, ask your health care provider. You may receive counseling support and other resources to help you quit.  Schedule a dentist appointment. At home, brush your teeth with a soft toothbrush and be gentle when you floss. Contact a health care provider if:  You have dizziness.  You have mild pelvic cramps, pelvic pressure, or nagging pain in the abdominal area.  You have persistent nausea, vomiting, or diarrhea.  You have a bad smelling vaginal discharge.  You have pain when you urinate.  You notice increased swelling in your face, hands, legs, or ankles.  You are exposed to fifth disease or chickenpox.  You are exposed to [place] measles (rubella) and have never had it. Get help right away if:  You have a fever.  You are leaking fluid from your vagina.  You have spotting or bleeding from your  vagina.  You have severe abdominal cramping or pain.  You have rapid weight gain or loss.  You vomit blood or material that looks like coffee grounds.  You develop a severe headache.  You have shortness of breath.  You have any kind of trauma, such as from a fall or a car accident. Summary  The first trimester of pregnancy is from week 1 until the end of week 13 (months 1 through 3).  Your body goes through many changes during pregnancy. The changes vary from woman to woman.  You will have routine prenatal visits. During those visits, your health care provider will examine you, discuss any test results you may have, and talk with you about how you are feeling. This information is not intended to replace advice given to you by your health care provider. Make sure you discuss any questions you have with your health care provider. Document Revised: 09/06/2017 Document Reviewed: 09/05/2016 Elsevier Patient Education  2020 [employer].

## 2020-05-25 NOTE — MAU Note (Signed)
Pt. Self swab

## 2020-05-26 LAB — GC/CHLAMYDIA PROBE AMP (~~LOC~~) NOT AT ARMC
Chlamydia: NEGATIVE
Comment: NEGATIVE
Comment: NORMAL
Neisseria Gonorrhea: NEGATIVE

## 2020-05-26 LAB — CULTURE, OB URINE: Culture: 20000 — AB

## 2020-06-06 ENCOUNTER — Encounter: Payer: Self-pay | Admitting: Obstetrics

## 2020-06-06 ENCOUNTER — Other Ambulatory Visit (HOSPITAL_COMMUNITY): Payer: Self-pay | Admitting: Women's Health

## 2020-06-06 ENCOUNTER — Ambulatory Visit
Admission: RE | Admit: 2020-06-06 | Discharge: 2020-06-06 | Disposition: A | Discharge status: Home / Self Care | Payer: Medicare Other | Source: Ambulatory Visit | Attending: Women's Health | Admitting: Women's Health

## 2020-06-06 ENCOUNTER — Telehealth (INDEPENDENT_AMBULATORY_CARE_PROVIDER_SITE_OTHER): Payer: Medicare Other | Admitting: Obstetrics

## 2020-06-06 ENCOUNTER — Other Ambulatory Visit: Payer: Self-pay

## 2020-06-06 DIAGNOSIS — O30041 Twin pregnancy, dichorionic/diamniotic, first trimester: Secondary | ICD-10-CM

## 2020-06-06 DIAGNOSIS — O99019 Anemia complicating pregnancy, unspecified trimester: Secondary | ICD-10-CM

## 2020-06-06 DIAGNOSIS — Z3491 Encounter for supervision of normal pregnancy, unspecified, first trimester: Secondary | ICD-10-CM

## 2020-06-06 DIAGNOSIS — O3680X0 Pregnancy with inconclusive fetal viability, not applicable or unspecified: Secondary | ICD-10-CM | POA: Diagnosis present

## 2020-06-06 MED ORDER — FERROUS SULFATE 325 (65 FE) MG PO TABS: 325.0000 mg | ORAL_TABLET | Freq: Two times a day (BID) | ORAL | 5 refills | Status: DC

## 2020-06-06 MED ORDER — FOLIC ACID 1 MG PO TABS: 1.0000 mg | ORAL_TABLET | Freq: Every day | ORAL | 2 refills | Status: DC

## 2020-06-06 NOTE — Progress Notes (Signed)
TELEHEALTH GYNECOLOGY VISIT ENCOUNTER NOTE  I connected with Tiffany Hall on 06/06/20 at  4:15 PM EDT by telephone at home and verified that I am speaking with the correct person using two identifiers.   I discussed the limitations, risks, security and privacy concerns of performing an evaluation and management service by telephone and the availability of in person appointments. I also discussed with the patient that there may be a patient responsible charge related to this service. The patient expressed understanding and agreed to proceed.   History:  Tiffany Hall is a 34 y.o. [id number] female being evaluated today for ultrasound results for fetal viability and dating. She denies any abnormal vaginal discharge, bleeding, pelvic pain or other concerns.       Past Medical History:  Diagnosis Date  . Anemia   . Anxiety   . Depression   . GERD (gastroesophageal reflux disease)    medication not needed  . Heart murmur   . History of abnormal cervical Pap smear    2009  . History of cardiac murmur as a child   . History of chlamydia    2008  . History of gallstones    2012  . History of gonorrhea    2008  . History of ovarian cyst   . History of trichomonal vaginitis    2008  . Hypertension    PIH with pregnancy  . Migraine headache   . Sickle cell trait ([place])   . Vaginal Pap smear, abnormal    HPV +   Past Surgical History:  Procedure Laterality Date  . COLPOSCOPY    . dilatation and currettage  N/A   . DILATION AND CURETTAGE OF UTERUS  2009   W/  SUCTION --  required blood transfusion  . DILATION AND EVACUATION N/A 09/14/2015   Procedure: DILATATION AND EVACUATION;  Surgeon: Linda Hedges, DO;  Location: [facility];  Service: Gynecology;  Laterality: N/A;   The following portions of the patient's history were reviewed and updated as appropriate: allergies, current medications, past family history, past medical history, past social history, past surgical history and  problem list.   Health Maintenance:  Normal pap and negative HRHPV on 02-21-2015.    Review of Systems:  Pertinent items noted in HPI and remainder of comprehensive ROS otherwise negative.  Physical Exam:   General:  Alert, oriented and cooperative.   Mental Status: Normal mood and affect perceived. Normal judgment and thought content.  Physical exam deferred due to nature of the encounter  Labs and Imaging Results for orders placed or performed during the hospital encounter of 05/25/20 (from the past 336 hour(s))  Pregnancy, urine POC   Collection Time: 05/25/20 10:09 AM  Result Value Ref Range   Preg Test, Ur POSITIVE (A) NEGATIVE  Urinalysis, Routine w reflex microscopic Urine, Clean Catch   Collection Time: 05/25/20 10:17 AM  Result Value Ref Range   Color, Urine YELLOW YELLOW   APPearance CLEAR CLEAR   Specific Gravity, Urine 1.018 1.005 - 1.030   pH 6.0 5.0 - 8.0   Glucose, UA NEGATIVE NEGATIVE mg/dL   Hgb urine dipstick SMALL (A) NEGATIVE   Bilirubin Urine NEGATIVE NEGATIVE   Ketones, ur 20 (A) NEGATIVE mg/dL   Protein, ur NEGATIVE NEGATIVE mg/dL   Nitrite NEGATIVE NEGATIVE   Leukocytes,Ua NEGATIVE NEGATIVE   RBC / HPF 0-5 0 - 5 RBC/hpf   WBC, UA 0-5 0 - 5 WBC/hpf   Bacteria, UA RARE (A) NONE SEEN  Squamous Epithelial / LPF 0-5 0 - 5   Mucus PRESENT   GC/Chlamydia probe amp ([place])not at [facility]   Collection Time: 05/25/20 10:28 AM  Result Value Ref Range   Neisseria Gonorrhea Negative    Chlamydia Negative    Comment Normal Reference [place] Chlamydia - Negative    Comment      Normal Reference Range Neisseria Gonorrhea - Negative  CBC   Collection Time: 05/25/20 10:41 AM  Result Value Ref Range   WBC 8.0 4.0 - 10.5 K/uL   RBC 4.61 3.87 - 5.11 MIL/uL   Hemoglobin 11.9 (L) 12.0 - 15.0 g/dL   HCT 35.0 (L) 36 - 46 %   MCV 75.9 (L) 80.0 - 100.0 fL   MCH 25.8 (L) 26.0 - 34.0 pg   MCHC 34.0 30.0 - 36.0 g/dL   RDW 14.1 11.5 - 15.5 %   Platelets 421 (H) 150  - 400 K/uL   nRBC 0.0 0.0 - 0.2 %  Comprehensive metabolic panel   Collection Time: 05/25/20 10:41 AM  Result Value Ref Range   Sodium 134 (L) 135 - 145 mmol/L   Potassium 3.9 3.5 - 5.1 mmol/L   Chloride 101 98 - 111 mmol/L   CO2 23 22 - 32 mmol/L   Glucose, Bld 147 (H) 70 - 99 mg/dL   BUN 8 6 - 20 mg/dL   Creatinine, Ser 0.56 0.44 - 1.00 mg/dL   Calcium 9.6 8.9 - 10.3 mg/dL   Total Protein 7.1 6.5 - 8.1 g/dL   Albumin 3.7 3.5 - 5.0 g/dL   AST 37 15 - 41 U/L   ALT 58 (H) 0 - 44 U/L   Alkaline Phosphatase 83 38 - 126 U/L   Total Bilirubin 0.6 0.3 - 1.2 mg/dL   GFR calc non Af Amer >60 >60 mL/min   GFR calc Af Amer >60 >60 mL/min   Anion gap 10 5 - 15  hCG, quantitative, pregnancy   Collection Time: 05/25/20 10:41 AM  Result Value Ref Range   hCG, Beta Chain, Quant, S 1,682 (H) <5 mIU/mL  HIV Antibody (routine testing w rflx)   Collection Time: 05/25/20 10:41 AM  Result Value Ref Range   HIV Screen 4th Generation wRfx Non Reactive Non Reactive  Wet prep, genital   Collection Time: 05/25/20 11:10 AM   Specimen: PATH Cytology Cervicovaginal Ancillary Only  Result Value Ref Range   Yeast Wet Prep HPF POC NONE SEEN NONE SEEN   Trich, Wet Prep NONE SEEN NONE SEEN   Clue Cells Wet Prep HPF POC NONE SEEN NONE SEEN   WBC, Wet Prep HPF POC MANY (A) NONE SEEN   Sperm NONE SEEN   Culture, OB Urine   Collection Time: 05/25/20 12:07 PM   Specimen: Urine, Random  Result Value Ref Range   Specimen Description URINE, RANDOM    Special Requests NONE    Culture (A)     20,000 COLONIES/mL MULTIPLE SPECIES PRESENT, SUGGEST RECOLLECTION NO GROUP B STREP (S.AGALACTIAE) ISOLATED Performed at [facility], [address], [place], [place] [address]    Report Status 05/26/2020 FINAL    US OB Comp AddL Gest Less 14 Wks  Result Date: 05/25/2020 CLINICAL DATA:  Abdominal cramping, pregnant EXAM: TWIN OBSTETRIC <14WK [place] AND TRANSVAGINAL OB US COMPARISON:  None FINDINGS: Number of IUPs:   2 Chorionicity/Amnionicity:  Dichorionic-diamniotic (thick membrane) TWIN 1 Yolk sac:  Absent Embryo:  Absent Cardiac Activity: N/A Heart Rate: N/A bpm MSD: 2.2  mm   4 w   6 d CRL:    mm    w  d                  [place] EDC: TWIN 2 Yolk sac:  Absent Embryo:  Absent Cardiac Activity: N/A Heart Rate: N/A bpm MSD: 2.7 mm   4 w   6 d CRL:    mm    w  d                  [place] EDC: Subchorionic hemorrhage:  None visualized. Maternal uterus/adnexae: Uterus anteverted, otherwise normal appearance. Minimal endometrial fluid. LEFT ovary normal size and morphology 1.7 x 3.1 x 1.3 cm. RIGHT ovary measures 3.2 x 2.2 x 2.4 cm and contains a corpus luteal cyst. No free pelvic fluid or adnexal masses. IMPRESSION: Twin intrauterine gestational sacs as above. Electronically Signed   By: Lavonia Dana M.D.   On: 05/25/2020 12:14   US OB Transvaginal  Result Date: 06/06/2020 CLINICAL DATA:  Viability of pregnancy. EXAM: US OB TRANSVAGINAL COMPARISON:  May 25, 2020. FINDINGS: Number of IUPs:  2 Chorionicity/Amnionicity:  Dichorionic-diamniotic (thick membrane) TWIN 1 Yolk sac:  Visualized. Embryo:  Visualized. Cardiac Activity: Visualized. Heart Rate: 146 bpm CRL:  3.5 mm   6 w 0 d                  [place] EDC: January 30, 2021. TWIN 2 Yolk sac:  Visualized. Embryo:  Visualized. Cardiac Activity: Visualized. Heart Rate: 123 bpm CRL:  5.5 mm   6 w 2 d                  [place] [facility]: January 28, 2021. Subchorionic hemorrhage:  None visualized. Maternal uterus/adnexae: Ovaries are unremarkable. No free fluid is noted. IMPRESSION: Twin live intrauterine pregnancies as described above. Electronically Signed   By: Marijo Conception M.D.   On: 06/06/2020 10:11   US OB LESS THAN 14 WEEKS WITH OB TRANSVAGINAL  Result Date: 05/25/2020 CLINICAL DATA:  Abdominal cramping, pregnant EXAM: TWIN OBSTETRIC <14WK [place] AND TRANSVAGINAL OB US COMPARISON:  None FINDINGS: Number of IUPs:  2 Chorionicity/Amnionicity:  Dichorionic-diamniotic (thick membrane) TWIN 1 Yolk sac:   Absent Embryo:  Absent Cardiac Activity: N/A Heart Rate: N/A bpm MSD: 2.2 mm   4 w   6 d CRL:    mm    w  d                  [place] EDC: TWIN 2 Yolk sac:  Absent Embryo:  Absent Cardiac Activity: N/A Heart Rate: N/A bpm MSD: 2.7 mm   4 w   6 d CRL:    mm    w  d                  [place] EDC: Subchorionic hemorrhage:  None visualized. Maternal uterus/adnexae: Uterus anteverted, otherwise normal appearance. Minimal endometrial fluid. LEFT ovary normal size and morphology 1.7 x 3.1 x 1.3 cm. RIGHT ovary measures 3.2 x 2.2 x 2.4 cm and contains a corpus luteal cyst. No free pelvic fluid or adnexal masses. IMPRESSION: Twin intrauterine gestational sacs as above. Electronically Signed   By: Lavonia Dana M.D.   On: 05/25/2020 12:14      Assessment and Plan:     1. Normal intrauterine pregnancy on prenatal ultrasound in first trimester  2. Dichorionic diamniotic twin pregnancy in first trimester Rx: - folic acid (FOLVITE)  1 MG tablet; Take 1 tablet (1 mg total) by mouth daily.  Dispense: 90 tablet; Refill: 2  3. Anemia affecting pregnancy, antepartum Rx: - ferrous sulfate 325 (65 FE) MG tablet; Take 1 tablet (325 mg total) by mouth 2 (two) times daily with a meal.  Dispense: 60 tablet; Refill: 5      I discussed the assessment and treatment plan with the patient. The patient was provided an opportunity to ask questions and all were answered. The patient agreed with the plan and demonstrated an understanding of the instructions.   The patient was advised to call back or seek an in-person evaluation/go to the ED if the symptoms worsen or if the condition fails to improve as anticipated.  I provided 15 minutes of non-face-to-face time during this encounter.   Baltazar Najjar, MD [facility] 06/06/20

## 2020-06-06 NOTE — Progress Notes (Signed)
Virtual to discuss F/U U/S results.

## 2020-06-14 ENCOUNTER — Other Ambulatory Visit: Payer: Self-pay

## 2020-06-14 ENCOUNTER — Other Ambulatory Visit: Payer: Self-pay | Admitting: Obstetrics and Gynecology

## 2020-06-14 DIAGNOSIS — O30041 Twin pregnancy, dichorionic/diamniotic, first trimester: Secondary | ICD-10-CM

## 2020-06-14 DIAGNOSIS — O99019 Anemia complicating pregnancy, unspecified trimester: Secondary | ICD-10-CM

## 2020-06-14 MED ORDER — FOLIC ACID 1 MG PO TABS: 1.0000 mg | ORAL_TABLET | Freq: Every day | ORAL | 2 refills | Status: DC

## 2020-06-14 MED ORDER — FERROUS SULFATE 325 (65 FE) MG PO TABS: 325.0000 mg | ORAL_TABLET | Freq: Two times a day (BID) | ORAL | 5 refills | Status: DC

## 2020-06-14 MED ORDER — PANTOPRAZOLE SODIUM 40 MG PO TBEC: 40.0000 mg | DELAYED_RELEASE_TABLET | Freq: Every day | ORAL | 1 refills | Status: DC

## 2020-06-16 ENCOUNTER — Ambulatory Visit: Payer: Self-pay

## 2020-06-16 ENCOUNTER — Other Ambulatory Visit: Payer: Self-pay

## 2020-06-16 ENCOUNTER — Encounter (HOSPITAL_COMMUNITY): Payer: Self-pay | Admitting: Obstetrics & Gynecology

## 2020-06-16 ENCOUNTER — Inpatient Hospital Stay (HOSPITAL_COMMUNITY)
Admission: AD | Admit: 2020-06-16 | Discharge: 2020-06-16 | Disposition: A | Discharge status: Home / Self Care | Payer: Medicare Other | Attending: Obstetrics & Gynecology | Admitting: Obstetrics & Gynecology

## 2020-06-16 DIAGNOSIS — D573 Sickle-cell trait: Secondary | ICD-10-CM | POA: Diagnosis not present

## 2020-06-16 DIAGNOSIS — D509 Iron deficiency anemia, unspecified: Secondary | ICD-10-CM | POA: Insufficient documentation

## 2020-06-16 DIAGNOSIS — O10911 Unspecified pre-existing hypertension complicating pregnancy, first trimester: Secondary | ICD-10-CM | POA: Insufficient documentation

## 2020-06-16 DIAGNOSIS — O26891 Other specified pregnancy related conditions, first trimester: Secondary | ICD-10-CM | POA: Insufficient documentation

## 2020-06-16 DIAGNOSIS — O99611 Diseases of the digestive system complicating pregnancy, first trimester: Secondary | ICD-10-CM | POA: Insufficient documentation

## 2020-06-16 DIAGNOSIS — R519 Headache, unspecified: Secondary | ICD-10-CM | POA: Insufficient documentation

## 2020-06-16 DIAGNOSIS — Z880 Allergy status to penicillin: Secondary | ICD-10-CM | POA: Insufficient documentation

## 2020-06-16 DIAGNOSIS — F329 Major depressive disorder, single episode, unspecified: Secondary | ICD-10-CM | POA: Diagnosis not present

## 2020-06-16 DIAGNOSIS — Z79899 Other long term (current) drug therapy: Secondary | ICD-10-CM | POA: Insufficient documentation

## 2020-06-16 DIAGNOSIS — K219 Gastro-esophageal reflux disease without esophagitis: Secondary | ICD-10-CM | POA: Insufficient documentation

## 2020-06-16 DIAGNOSIS — R42 Dizziness and giddiness: Secondary | ICD-10-CM | POA: Diagnosis not present

## 2020-06-16 DIAGNOSIS — Z87891 Personal history of nicotine dependence: Secondary | ICD-10-CM | POA: Diagnosis not present

## 2020-06-16 DIAGNOSIS — O99011 Anemia complicating pregnancy, first trimester: Secondary | ICD-10-CM | POA: Insufficient documentation

## 2020-06-16 DIAGNOSIS — K0889 Other specified disorders of teeth and supporting structures: Secondary | ICD-10-CM

## 2020-06-16 DIAGNOSIS — Z8249 Family history of ischemic heart disease and other diseases of the circulatory system: Secondary | ICD-10-CM | POA: Diagnosis not present

## 2020-06-16 DIAGNOSIS — K047 Periapical abscess without sinus: Secondary | ICD-10-CM | POA: Insufficient documentation

## 2020-06-16 DIAGNOSIS — Z885 Allergy status to narcotic agent status: Secondary | ICD-10-CM | POA: Diagnosis not present

## 2020-06-16 DIAGNOSIS — Z3A08 8 weeks gestation of pregnancy: Secondary | ICD-10-CM | POA: Diagnosis not present

## 2020-06-16 DIAGNOSIS — O99341 Other mental disorders complicating pregnancy, first trimester: Secondary | ICD-10-CM | POA: Diagnosis not present

## 2020-06-16 DIAGNOSIS — F419 Anxiety disorder, unspecified: Secondary | ICD-10-CM | POA: Diagnosis not present

## 2020-06-16 LAB — CBC
HCT: 30.3 % — ABNORMAL LOW (ref 36.0–46.0)
Hemoglobin: 10.5 g/dL — ABNORMAL LOW (ref 12.0–15.0)
MCH: 26.1 pg (ref 26.0–34.0)
MCHC: 34.7 g/dL (ref 30.0–36.0)
MCV: 75.4 fL — ABNORMAL LOW (ref 80.0–100.0)
Platelets: 361 10*3/uL (ref 150–400)
RBC: 4.02 MIL/uL (ref 3.87–5.11)
RDW: 13.9 % (ref 11.5–15.5)
WBC: 9.2 10*3/uL (ref 4.0–10.5)
nRBC: 0 % (ref 0.0–0.2)

## 2020-06-16 LAB — URINALYSIS, ROUTINE W REFLEX MICROSCOPIC
Bilirubin Urine: NEGATIVE
Glucose, UA: 50 mg/dL — AB
Ketones, ur: NEGATIVE mg/dL
Leukocytes,Ua: NEGATIVE
Nitrite: NEGATIVE
Protein, ur: NEGATIVE mg/dL
Specific Gravity, Urine: 1.029 (ref 1.005–1.030)
pH: 6 (ref 5.0–8.0)

## 2020-06-16 LAB — GLUCOSE, CAPILLARY: Glucose-Capillary: 131 mg/dL — ABNORMAL HIGH (ref 70–99)

## 2020-06-16 LAB — CULTURE, OB URINE

## 2020-06-16 MED ORDER — AMOXICILLIN 500 MG PO CAPS: 500.0000 mg | ORAL_CAPSULE | Freq: Three times a day (TID) | ORAL | 0 refills | Status: DC

## 2020-06-16 MED ORDER — BUTALBITAL-APAP-CAFFEINE 50-325-40 MG PO TABS
2.0000 | ORAL_TABLET | Freq: Once | ORAL | Status: AC
  Administered 2020-06-16: 2 via ORAL
  Filled 2020-06-16: qty 2

## 2020-06-16 NOTE — Discharge Instructions (Signed)
Dental Abscess  A dental abscess is a collection of pus in or around a tooth that results from an infection. An abscess can cause pain in the affected area as well as other symptoms. Treatment is important to help with symptoms and to prevent the infection from spreading. What are the causes? This condition is caused by a bacterial infection around the root of the tooth that involves the inner part of the tooth (pulp). It may result from:  Severe tooth decay.  Trauma to the tooth, such as a broken or chipped tooth, that allows bacteria to enter into the pulp.  Severe gum disease around a tooth. What increases the risk? This condition is more likely to develop in males. It is also more likely to develop in people who:  Have dental decay (cavities).  Eat sugary snacks between meals.  Use tobacco products.  Have diabetes.  Have a weakened disease-fighting system (immune system).  Do not brush and care for their teeth regularly. What are the signs or symptoms? Symptoms of this condition include:  Severe pain in and around the infected tooth.  Swelling and redness around the infected tooth, in the mouth, or in the face.  Tenderness.  Pus drainage.  Bad breath.  Bitter taste in the mouth.  Difficulty swallowing.  Difficulty opening the mouth.  Nausea.  Vomiting.  Chills.  Swollen neck glands.  Fever. How is this diagnosed? This condition is diagnosed based on:  Your symptoms and your medical and dental history.  An examination of the infected tooth. During the exam, your dentist may tap on the infected tooth. You may also have X-rays of the affected area. How is this treated? This condition is treated by getting rid of the infection. This may be done with:  Incision and drainage. This procedure is done by making an incision in the abscess to drain out the pus. Removing pus is the first priority in treating an abscess.  Antibiotic medicines. These may be used  in certain situations.  Antibacterial mouth rinse.  A root canal. This may be performed to save the tooth. Your dentist accesses the visible part of your tooth (crown) with a drill and removes any damaged pulp. Then the space is filled and sealed off.  Tooth extraction. The tooth is pulled out if it cannot be saved by other treatment. You may also receive treatment for pain, such as:  Acetaminophen or NSAIDs.  Gels that contain a numbing medicine.  An injection to block the pain near your nerve. Follow these instructions at home: Medicines  Take over-the-counter and prescription medicines only as told by your dentist.  If you were prescribed an antibiotic, take it as told by your dentist. Do not stop taking the antibiotic even if you start to feel better.  If you were prescribed a gel that contains a numbing medicine, use it exactly as told in the directions. Do not use these gels for children who are younger than 1 years of age.  Do not drive or use heavy machinery while taking prescription pain medicine. General instructions  Rinse out your mouth often with salt water to relieve pain or swelling. To make a salt-water mixture, completely dissolve -1 tsp of salt in 1 cup of warm water.  Eat a soft diet while your abscess is healing.  Drink enough fluid to keep your urine pale yellow.  Do not apply heat to the outside of your mouth.  Do not use any products that contain nicotine or  tobacco, such as cigarettes and e-cigarettes. If you need help quitting, ask your health care provider.  Keep all follow-up visits as told by your dentist. This is important. How is this prevented?  Brush your teeth every morning and night with fluoride toothpaste. Floss one time each day.  Get regularly scheduled dental cleanings.  Consider having a dental sealant applied on teeth that have deep holes (caries).  Drink fluoridated water regularly. This includes most tap water. Check the label  on bottled water to see if it contains fluoride.  Drink water instead of sugary drinks.  Eat healthy meals and snacks.  Wear a mouth guard or face shield to protect your teeth while playing sports. Contact a health care provider if:  Your pain is worse and is not helped by medicine. Get help right away if:  You have a fever or chills.  Your symptoms suddenly get worse.  You have a very bad headache.  You have problems breathing or swallowing.  You have trouble opening your mouth.  You have swelling in your neck or around your eye. Summary  A dental abscess is a collection of pus in or around a tooth that results from an infection.  A dental abscess may result from severe tooth decay, trauma to the tooth, or severe gum disease around a tooth.  Symptoms include severe pain, swelling, redness, and drainage of pus in and around the infected tooth.  The first priority in treating a dental abscess is to drain out the pus. Treatment may also involve removing damage inside the tooth (root canal) or pulling out (extracting) the tooth. This information is not intended to replace advice given to you by your health care provider. Make sure you discuss any questions you have with your health care provider. Document Revised: 09/06/2017 Document Reviewed: 05/27/2017 [employer] Patient Education  2020 [employer]  

## 2020-06-16 NOTE — MAU Provider Note (Signed)
History     CSN: [id number]  Arrival date and time: 06/16/20 0220   First Provider Initiated Contact with Patient 06/16/20 [phone]      Chief Complaint  Patient presents with  . Headache    dizzyness swollen jaw   Tiffany Hall is a 34 y.o. G8P5 at [redacted]w[redacted]d who presents to MAU with complaints of HA, dizziness and swollen jaw. Patient reports swollen jaw started initially three days ago. Reports that is hurts to chew or swallow specifically on that side, rates 8/10. She reports gum tenderness and pain is associated with swollen jaw. Patient denies sore throat, nasal congestion, cough, fever or chills. Patient reports yesterday she started getting a HA that was specific to left temporal lobe above where jaw is swollen. Patient reports taking medication yesterday for HA which resolved it but then it came back today. Rates HA 5/10. Patient reports one occurrence of dizziness yesterday during HA. She denies any pregnancy related concerns or other symptoms. Denies vaginal bleeding, abdominal pain, vaginal discharge or urinary symptoms.    OB History    Gravida  8   Para  5   Term  5   Preterm  0   AB  1   Living  5     SAB  1   TAB  0   Ectopic  0   Multiple  0   Live Births  3           Past Medical History:  Diagnosis Date  . Anemia   . Anxiety   . Depression   . GERD (gastroesophageal reflux disease)    medication not needed  . Heart murmur   . History of abnormal cervical Pap smear    2009  . History of cardiac murmur as a child   . History of chlamydia    2008  . History of gallstones    2012  . History of gonorrhea    2008  . History of ovarian cyst   . History of trichomonal vaginitis    2008  . Hypertension    PIH with pregnancy  . Migraine headache   . Sickle cell trait ([place])   . Vaginal Pap smear, abnormal    HPV +    Past Surgical History:  Procedure Laterality Date  . COLPOSCOPY    . dilatation and currettage  N/A   . DILATION AND  CURETTAGE OF UTERUS  2009   W/  SUCTION --  required blood transfusion  . DILATION AND EVACUATION N/A 09/14/2015   Procedure: DILATATION AND EVACUATION;  Surgeon: Linda Hedges, DO;  Location: [facility];  Service: Gynecology;  Laterality: N/A;    Family History  Problem Relation Age of Onset  . Diabetes Mother   . Hypertension Mother   . Hepatitis Mother   . Cancer Mother 13       Breast, cervical  . Diabetes Maternal Grandmother   . Hypertension Maternal Grandmother   . Cancer Maternal Grandmother 40       Breast  . Anesthesia problems Neg Hx   . Other Neg Hx     Social History   Tobacco Use  . Smoking status: Former Smoker    Packs/day: 0.00    Types: Cigarettes    Quit date: 11/05/2010    Years since quitting: 9.6  . Smokeless tobacco: Never Used  . Tobacco comment: quit with preg  Substance Use Topics  . Alcohol use: No    Comment: occ  .  Drug use: No    Types: MDMA (Ecstacy)    Comment: prior to this pregnancy in 2009    Allergies:  Allergies  Allergen Reactions  . Amoxicillin Swelling    Did it involve swelling of the face/tongue/throat, SOB, or low BP? No Did it involve sudden or severe rash/hives, skin peeling, or any reaction on the inside of your mouth or nose? No Did you need to seek medical attention at a hospital or doctor's office? No When did it last happen?last year If all above answers are "NO", may proceed with cephalosporin use.   . Nubain [Nalbuphine Hcl] Swelling, Palpitations and Other (See Comments)    Pt states that she is able to take Percocet without any problems.       Medications Prior to Admission  Medication Sig Dispense Refill Last Dose  . escitalopram (LEXAPRO) 10 MG tablet Take 10 mg by mouth daily.   Past Month at Unknown time  . ferrous sulfate 325 (65 FE) MG tablet Take 1 tablet (325 mg total) by mouth 2 (two) times daily with a meal. 60 tablet 5 01/12/8294 at 9pm  . folic acid (FOLVITE) 1 MG tablet Take 1 tablet (1 mg total)  by mouth daily. 90 tablet 2 06/15/2020 at 9pm  . Prenatal Vit-Fe Fumarate-FA (MULTIVITAMIN-PRENATAL) 27-0.8 MG TABS tablet Take 1 tablet by mouth daily at 12 noon.   06/15/2020 at 9pm  . ALPRAZolam (XANAX) 0.5 MG tablet Take 0.5 mg by mouth 2 (two) times daily as needed for anxiety.     . pantoprazole (PROTONIX) 40 MG tablet Take 1 tablet (40 mg total) by mouth daily. 30 tablet 1     Review of Systems  Constitutional: Negative.   HENT: Positive for dental problem and facial swelling. Negative for congestion, ear discharge, ear pain, sinus pressure, sinus pain, sneezing and sore throat.   Respiratory: Negative.   Cardiovascular: Negative.   Gastrointestinal: Negative.   Genitourinary: Negative.   Musculoskeletal: Negative.   Neurological: Positive for dizziness and headaches. Negative for weakness and light-headedness.  Psychiatric/Behavioral: Negative.    Physical Exam   Blood pressure 119/65, pulse 77, temperature 99 F (37.2 C), resp. rate 18, last menstrual period 04/21/2020, SpO2 99 %.  Physical Exam Vitals and nursing note reviewed.  HENT:     Head: Normocephalic.     Mouth/Throat:     Mouth: Mucous membranes are moist. No oral lesions.     Dentition: Gingival swelling and dental caries present.     Tongue: No lesions.     Tonsils: No tonsillar exudate or tonsillar abscesses.   Cardiovascular:     Rate and Rhythm: Normal rate and regular rhythm.  Pulmonary:     Effort: Pulmonary effort is normal.     Breath sounds: Normal breath sounds.  Abdominal:     General: There is no distension.     Palpations: Abdomen is soft. There is no mass.     Tenderness: There is no abdominal tenderness. There is no guarding.  Genitourinary:    Comments: deferred Skin:    General: Skin is warm and dry.  Neurological:     Mental Status: She is alert and oriented to person, place, and time.  Psychiatric:        Mood and Affect: Mood normal.        Speech: Speech normal.        Behavior:  Behavior normal.     MAU Course  Procedures  MDM Patient reports that she has  not been to the dentist in years. Symptoms most likely related to dental infection   CBC obtained to assess for change in Hgb d/t dizziness, patient is anemic and has been taking iron supplementation   Orders Placed This Encounter  Procedures  . Culture, OB Urine  . Urinalysis, Routine w reflex microscopic Urine, Clean Catch  . CBC  . Glucose, capillary  . Discharge patient   Labs reviewed:  Results for orders placed or performed during the hospital encounter of 06/16/20 (from the past 24 hour(s))  Urinalysis, Routine w reflex microscopic Urine, Clean Catch     Status: Abnormal   Collection Time: 06/16/20  2:42 AM  Result Value Ref Range   Color, Urine YELLOW YELLOW   APPearance HAZY (A) CLEAR   Specific Gravity, Urine 1.029 1.005 - 1.030   pH 6.0 5.0 - 8.0   Glucose, UA 50 (A) NEGATIVE mg/dL   Hgb urine dipstick MODERATE (A) NEGATIVE   Bilirubin Urine NEGATIVE NEGATIVE   Ketones, ur NEGATIVE NEGATIVE mg/dL   Protein, ur NEGATIVE NEGATIVE mg/dL   Nitrite NEGATIVE NEGATIVE   Leukocytes,Ua NEGATIVE NEGATIVE   RBC / HPF 6-10 0 - 5 RBC/hpf   WBC, UA 0-5 0 - 5 WBC/hpf   Bacteria, UA RARE (A) NONE SEEN   Squamous Epithelial / LPF 0-5 0 - 5   Mucus PRESENT   CBC     Status: Abnormal   Collection Time: 06/16/20  3:50 AM  Result Value Ref Range   WBC 9.2 4.0 - 10.5 K/uL   RBC 4.02 3.87 - 5.11 MIL/uL   Hemoglobin 10.5 (L) 12.0 - 15.0 g/dL   HCT 30.3 (L) 36 - 46 %   MCV 75.4 (L) 80.0 - 100.0 fL   MCH 26.1 26.0 - 34.0 pg   MCHC 34.7 30.0 - 36.0 g/dL   RDW 13.9 11.5 - 15.5 %   Platelets 361 150 - 400 K/uL   nRBC 0.0 0.0 - 0.2 %  Glucose, capillary     Status: Abnormal   Collection Time: 06/16/20  4:02 AM  Result Value Ref Range   Glucose-Capillary 131 (H) 70 - 99 mg/dL   POCT CBG obtained d/t increased glucose in urine, patient reports that she is prediabetic and is being followed by PCP  regularly.  Treatments in MAU included fioricet for HA - reassessment after medication patient reports HA is resolved  CBC - Hgb 10.5, encouraged to continue iron supplementation with vitamin C and increase protein in diet   Meds ordered this encounter  Medications  . butalbital-acetaminophen-caffeine (FIORICET) 50-325-40 MG per tablet 2 tablet  . amoxicillin (AMOXIL) 500 MG capsule    Sig: Take 1 capsule (500 mg total) by mouth 3 (three) times daily.    Dispense:  21 capsule    Refill:  0    Order Specific Question:   Supervising Provider    Answer:   Woodroe Mode [phone]   Educated and discussed patient needs to follow up with dentist for caries. Patient encouraged to floss daily and brush teeth multiple times a day to help with swollen gums as gingivitis increases during pregnancy, patient verbalizes understanding. Dental note given to patient. Patient reports that amoxicillin is on allergy list but has been able to take penicillin recently without any side effects. Rx for amoxicillin initiated.  Discussed reasons to return to MAU. Follow up as scheduled for NOB appointment. Return to MAU as needed. Pt stable at time of discharge.   Assessment and  Plan   1. Headache in pregnancy, antepartum, first trimester   2. Dizziness   3. Dental infection   4. Iron deficiency anemia during pregnancy    Discharge home Follow up as scheduled in the office for prenatal care Return to MAU as needed for reasons discussed and/or emergencies  Rx for amoxicillin    Follow-up [facility] Follow up.   Specialty: Obstetrics and Gynecology Contact information: [address], [place] [phone]             Allergies as of 06/16/2020      Reactions   Amoxicillin Swelling   Did it involve swelling of the face/tongue/throat, SOB, or low BP? No Did it involve sudden or severe rash/hives, skin peeling, or any  reaction on the inside of your mouth or nose? No Did you need to seek medical attention at a hospital or doctor's office? No When did it last happen?last year If all above answers are "NO", may proceed with cephalosporin use.   Nubain [nalbuphine Hcl] Swelling, Palpitations, Other (See Comments)   Pt states that she is able to take Percocet without any problems.        Medication List    TAKE these medications   ALPRAZolam 0.5 MG tablet Commonly known as: XANAX Take 0.5 mg by mouth 2 (two) times daily as needed for anxiety.   amoxicillin 500 MG capsule Commonly known as: AMOXIL Take 1 capsule (500 mg total) by mouth 3 (three) times daily.   escitalopram 10 MG tablet Commonly known as: LEXAPRO Take 10 mg by mouth daily.   ferrous sulfate 325 (65 FE) MG tablet Take 1 tablet (325 mg total) by mouth 2 (two) times daily with a meal.   folic acid 1 MG tablet Commonly known as: FOLVITE Take 1 tablet (1 mg total) by mouth daily.   multivitamin-prenatal 27-0.8 MG Tabs tablet Take 1 tablet by mouth daily at 12 noon.   pantoprazole 40 MG tablet Commonly known as: Protonix Take 1 tablet (40 mg total) by mouth daily.       Lajean Manes CNM 06/16/2020, 4:40 AM

## 2020-06-16 NOTE — MAU Note (Signed)
Tiffany Hall is a 33 y.o. at [redacted]w[redacted]d here in MAU reporting: headache, dizziness and swollen jaw  Onset of complaint: three days ago Pain score: 5

## 2020-06-19 ENCOUNTER — Inpatient Hospital Stay (HOSPITAL_COMMUNITY)
Admission: AD | Admit: 2020-06-19 | Discharge: 2020-06-19 | Disposition: A | Discharge status: Home / Self Care | Payer: Medicare Other | Attending: Obstetrics and Gynecology | Admitting: Obstetrics and Gynecology

## 2020-06-19 ENCOUNTER — Inpatient Hospital Stay (HOSPITAL_COMMUNITY): Payer: Medicare Other

## 2020-06-19 ENCOUNTER — Encounter (HOSPITAL_COMMUNITY): Payer: Self-pay | Admitting: Obstetrics and Gynecology

## 2020-06-19 ENCOUNTER — Other Ambulatory Visit: Payer: Self-pay

## 2020-06-19 DIAGNOSIS — F329 Major depressive disorder, single episode, unspecified: Secondary | ICD-10-CM | POA: Diagnosis not present

## 2020-06-19 DIAGNOSIS — O99341 Other mental disorders complicating pregnancy, first trimester: Secondary | ICD-10-CM | POA: Insufficient documentation

## 2020-06-19 DIAGNOSIS — O30041 Twin pregnancy, dichorionic/diamniotic, first trimester: Secondary | ICD-10-CM | POA: Diagnosis not present

## 2020-06-19 DIAGNOSIS — K219 Gastro-esophageal reflux disease without esophagitis: Secondary | ICD-10-CM | POA: Insufficient documentation

## 2020-06-19 DIAGNOSIS — O468X1 Other antepartum hemorrhage, first trimester: Secondary | ICD-10-CM

## 2020-06-19 DIAGNOSIS — Z3A08 8 weeks gestation of pregnancy: Secondary | ICD-10-CM | POA: Insufficient documentation

## 2020-06-19 DIAGNOSIS — F419 Anxiety disorder, unspecified: Secondary | ICD-10-CM | POA: Diagnosis not present

## 2020-06-19 DIAGNOSIS — Z87891 Personal history of nicotine dependence: Secondary | ICD-10-CM | POA: Insufficient documentation

## 2020-06-19 DIAGNOSIS — O99611 Diseases of the digestive system complicating pregnancy, first trimester: Secondary | ICD-10-CM | POA: Insufficient documentation

## 2020-06-19 DIAGNOSIS — O209 Hemorrhage in early pregnancy, unspecified: Secondary | ICD-10-CM | POA: Diagnosis present

## 2020-06-19 DIAGNOSIS — Z79899 Other long term (current) drug therapy: Secondary | ICD-10-CM | POA: Diagnosis not present

## 2020-06-19 DIAGNOSIS — O10911 Unspecified pre-existing hypertension complicating pregnancy, first trimester: Secondary | ICD-10-CM | POA: Diagnosis not present

## 2020-06-19 DIAGNOSIS — Z6790 Unspecified blood type, Rh positive: Secondary | ICD-10-CM

## 2020-06-19 DIAGNOSIS — O30009 Twin pregnancy, unspecified number of placenta and unspecified number of amniotic sacs, unspecified trimester: Secondary | ICD-10-CM

## 2020-06-19 LAB — URINALYSIS, ROUTINE W REFLEX MICROSCOPIC
Bacteria, UA: NONE SEEN
Bilirubin Urine: NEGATIVE
Glucose, UA: NEGATIVE mg/dL
Ketones, ur: 20 mg/dL — AB
Leukocytes,Ua: NEGATIVE
Nitrite: NEGATIVE
Protein, ur: NEGATIVE mg/dL
Specific Gravity, Urine: 1.029 (ref 1.005–1.030)
pH: 5 (ref 5.0–8.0)
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
## 2020-06-19 NOTE — Discharge Instructions (Signed)
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
## 2020-07-05 DIAGNOSIS — O0990 Supervision of high risk pregnancy, unspecified, unspecified trimester: Secondary | ICD-10-CM | POA: Insufficient documentation

## 2020-07-06 ENCOUNTER — Ambulatory Visit (INDEPENDENT_AMBULATORY_CARE_PROVIDER_SITE_OTHER): Payer: Medicare Other

## 2020-07-06 DIAGNOSIS — Z3490 Encounter for supervision of normal pregnancy, unspecified, unspecified trimester: Secondary | ICD-10-CM

## 2020-07-06 DIAGNOSIS — O0990 Supervision of high risk pregnancy, unspecified, unspecified trimester: Secondary | ICD-10-CM

## 2020-07-06 MED ORDER — BLOOD PRESSURE KIT DEVI: 1.0000 | 0 refills | Status: AC

## 2020-07-06 NOTE — Progress Notes (Signed)
I connected with  Tiffany Hall on 07/06/20 by a video enabled telemedicine application and verified that I am speaking with the correct person using two identifiers.  Patient is at home and I am at [facility]   I discussed the limitations of evaluation and management by telemedicine. The patient expressed understanding and agreed to proceed.   PRENATAL INTAKE SUMMARY  [name] presents today New OB Nurse Interview.  OB History    Gravida  8   Para  5   Term  5   Preterm  0   AB  1   Living  5     SAB  1   TAB  0   Ectopic  0   Multiple  0   Live Births  3          I have reviewed the patient's medical, obstetrical, social, and family histories, medications, and available lab results.  SUBJECTIVE She has no unusual complaints  OBJECTIVE Initial NURSE INTAKE (New OB)  GENERAL APPEARANCE: oriented to person, place and time   ASSESSMENT Normal pregnancy PHQ-9=6  PLAN Prenatal care OB Pnl/HIV will be done at NOB visit BP cuff ordered [employer] downloaded Pregnancy Risk Screening done

## 2020-07-11 NOTE — Progress Notes (Signed)
Patient was assessed and managed by nursing staff during this encounter. I have reviewed the chart and agree with the documentation and plan. I have also made any necessary editorial changes.  Mora Bellman, MD 07/11/2020 3:09 PM

## 2020-07-13 ENCOUNTER — Other Ambulatory Visit (HOSPITAL_COMMUNITY)
Admission: RE | Admit: 2020-07-13 | Discharge: 2020-07-13 | Disposition: A | Discharge status: Home / Self Care | Payer: Medicare Other | Source: Ambulatory Visit | Attending: Obstetrics and Gynecology | Admitting: Obstetrics and Gynecology

## 2020-07-13 ENCOUNTER — Other Ambulatory Visit: Payer: Self-pay

## 2020-07-13 ENCOUNTER — Ambulatory Visit (INDEPENDENT_AMBULATORY_CARE_PROVIDER_SITE_OTHER): Payer: BC Managed Care – PPO | Admitting: Obstetrics and Gynecology

## 2020-07-13 ENCOUNTER — Encounter: Payer: Self-pay | Admitting: Obstetrics and Gynecology

## 2020-07-13 VITALS — BP 116/81 | HR 112 | Wt 195.0 lb

## 2020-07-13 DIAGNOSIS — O30009 Twin pregnancy, unspecified number of placenta and unspecified number of amniotic sacs, unspecified trimester: Secondary | ICD-10-CM | POA: Insufficient documentation

## 2020-07-13 DIAGNOSIS — O99211 Obesity complicating pregnancy, first trimester: Secondary | ICD-10-CM | POA: Diagnosis not present

## 2020-07-13 DIAGNOSIS — Z1151 Encounter for screening for human papillomavirus (HPV): Secondary | ICD-10-CM | POA: Diagnosis not present

## 2020-07-13 DIAGNOSIS — R87810 Cervical high risk human papillomavirus (HPV) DNA test positive: Secondary | ICD-10-CM | POA: Diagnosis not present

## 2020-07-13 DIAGNOSIS — O30049 Twin pregnancy, dichorionic/diamniotic, unspecified trimester: Secondary | ICD-10-CM | POA: Diagnosis not present

## 2020-07-13 DIAGNOSIS — O09299 Supervision of pregnancy with other poor reproductive or obstetric history, unspecified trimester: Secondary | ICD-10-CM | POA: Diagnosis not present

## 2020-07-13 DIAGNOSIS — O0991 Supervision of high risk pregnancy, unspecified, first trimester: Secondary | ICD-10-CM | POA: Insufficient documentation

## 2020-07-13 DIAGNOSIS — O24319 Unspecified pre-existing diabetes mellitus in pregnancy, unspecified trimester: Secondary | ICD-10-CM

## 2020-07-13 DIAGNOSIS — Z3A11 11 weeks gestation of pregnancy: Secondary | ICD-10-CM | POA: Diagnosis not present

## 2020-07-13 DIAGNOSIS — O98511 Other viral diseases complicating pregnancy, first trimester: Secondary | ICD-10-CM | POA: Diagnosis not present

## 2020-07-13 DIAGNOSIS — O99210 Obesity complicating pregnancy, unspecified trimester: Secondary | ICD-10-CM | POA: Insufficient documentation

## 2020-07-13 MED ORDER — PROMETHAZINE HCL 25 MG PO TABS: 25.0000 mg | ORAL_TABLET | Freq: Four times a day (QID) | ORAL | 2 refills | Status: DC | PRN

## 2020-07-13 MED ORDER — ASPIRIN EC 81 MG PO TBEC: 81.0000 mg | DELAYED_RELEASE_TABLET | Freq: Every day | ORAL | 2 refills | Status: DC

## 2020-07-13 MED ORDER — DOXYLAMINE-PYRIDOXINE 10-10 MG PO TBEC: 2.0000 | DELAYED_RELEASE_TABLET | Freq: Every day | ORAL | 5 refills | Status: DC

## 2020-07-13 NOTE — Progress Notes (Signed)
Pt presents for provider NOB NOB intake completed 07/06/20  Pap smear due today Flu vaccine offered; pt reqs to wait Pt completed COVID vaccines - will bring card [place]

## 2020-07-13 NOTE — Progress Notes (Signed)
Subjective:    Tiffany Hall is a [id number] [redacted]w[redacted]d being seen today for her first obstetrical visit.  Her obstetrical history is significant for di-di twin pregnancy, maternal obesity, advanced maternal age and history of pp preeclampsia. Patient does intend to breast feed. Pregnancy history fully reviewed.  Patient reports nausea and vomiting.  Vitals:   07/13/20 1308 07/13/20 1319  BP: (!) 143/86 116/81  Pulse: (!) 112   Weight: 195 lb (88.5 kg)     HISTORY: OB History  Gravida Para Term Preterm AB Living  8 5 5  0 1 5  SAB TAB Ectopic Multiple Live Births  1 0 0 0 3    # Outcome Date GA Lbr Len/2nd Weight Sex Delivery Anes PTL Lv  8 Current           7 Term 01/10/15 [redacted]w[redacted]d 07:41 / 00:06 7 lb 3.5 oz (3.275 kg) M Vag-Spont EPI  LIV  6 Term 05/22/12 [redacted]w[redacted]d 00:23 / 00:14 6 lb 14.8 oz (3.14 kg) F Vag-Spont EPI  LIV  5 SAB 2009          4 Term 01/04/07 [redacted]w[redacted]d 08:00 6 lb 7 oz (2.92 kg) F Vag-Spont EPI N   3 Term 07/01/05 [redacted]w[redacted]d 06:00 7 lb 7 oz (3.374 kg) F Vag-Spont EPI Y LIV  2 Term 03/04/04 [redacted]w[redacted]d 72:00 7 lb 3 oz (3.26 kg) F Vag-Spont EPI N   1 [place]            Past Medical History:  Diagnosis Date  . Anemia   . Anxiety   . Depression   . GERD (gastroesophageal reflux disease)    medication not needed  . Heart murmur   . History of abnormal cervical Pap smear    2009  . History of cardiac murmur as a child   . History of chlamydia    2008  . History of gallstones    2012  . History of gonorrhea    2008  . History of ovarian cyst   . History of trichomonal vaginitis    2008  . Hypertension    PIH with pregnancy  . Migraine headache   . Sickle cell trait ([place])   . Vaginal Pap smear, abnormal    HPV +   Past Surgical History:  Procedure Laterality Date  . COLPOSCOPY    . dilatation and currettage  N/A   . DILATION AND CURETTAGE OF UTERUS  2009   W/  SUCTION --  required blood transfusion  . DILATION AND EVACUATION N/A 09/14/2015   Procedure: DILATATION AND  EVACUATION;  Surgeon: Linda Hedges, DO;  Location: [facility];  Service: Gynecology;  Laterality: N/A;   Family History  Problem Relation Age of Onset  . Diabetes Mother   . Hypertension Mother   . Hepatitis Mother   . Cancer Mother 43       Breast, cervical  . Diabetes Maternal Grandmother   . Hypertension Maternal Grandmother   . Cancer Maternal Grandmother 40       Breast  . Anesthesia problems Neg Hx   . Other Neg Hx      Exam    Uterus:     Pelvic Exam:    Perineum: Normal Perineum   Vulva: normal   Vagina:  normal mucosa, normal discharge   pH:    Cervix: multiparous appearance   Adnexa: not evaluated   Bony Pelvis: gynecoid  System: Breast:  normal appearance, no masses or tenderness   Skin:  normal coloration and turgor, no rashes    Neurologic: oriented, no focal deficits   Extremities: normal strength, tone, and muscle mass   HEENT extra ocular movement intact   Mouth/Teeth mucous membranes moist, pharynx normal without lesions and dental hygiene good   Neck supple and no masses   Cardiovascular: regular rate and rhythm   Respiratory:  appears well, vitals normal, no respiratory distress, acyanotic, normal RR, chest clear, no wheezing, crepitations, rhonchi, normal symmetric air entry   Abdomen: soft, non-tender; bowel sounds normal; no masses,  no organomegaly   Urinary:    Pt informed that the ultrasound is considered a limited OB ultrasound and is not intended to be a complete ultrasound exam.  Patient also informed that the ultrasound is not being completed with the intent of assessing for fetal or placental anomalies or any pelvic abnormalities.  Explained that the purpose of today's ultrasound is to assess for  viability.  Patient acknowledges the purpose of the exam and the limitations of the study.  Viable fetus x 2 seen on ultrasound     Assessment:    Pregnancy: [id number] Patient Active Problem List   Diagnosis Date Noted  . Twin pregnancy, antepartum  condition or complication 67/67/2094  . Supervision of high-risk pregnancy 07/05/2020  . Preeclampsia in postpartum period 01/15/2015  . Pregnancy 01/10/2015        Plan:     Initial labs drawn. Prenatal vitamins. Problem list reviewed and updated. Genetic Screening discussed : panorama ordered.  Ultrasound discussed; fetal survey: ordered. Rx diclegis and phenergan ordered Rx ASA ordered Baseline labs collected  Follow up in 4 weeks. 50% of 30 min visit spent on counseling and coordination of care.     Alonda Weaber 07/13/2020

## 2020-07-13 NOTE — Patient Instructions (Signed)
 Second Trimester of Pregnancy The second trimester is from week 14 through week 27 (months 4 through 6). The second trimester is often a time when you feel your best. Your body has adjusted to being pregnant, and you begin to feel better physically. Usually, morning sickness has lessened or quit completely, you may have more energy, and you may have an increase in appetite. The second trimester is also a time when the fetus is growing rapidly. At the end of the sixth month, the fetus is about 9 inches long and weighs about 1 pounds. You will likely begin to feel the baby move (quickening) between 16 and 20 weeks of pregnancy. Body changes during your second trimester Your body continues to go through many changes during your second trimester. The changes vary from woman to woman.  Your weight will continue to increase. You will notice your lower abdomen bulging out.  You may begin to get stretch marks on your hips, abdomen, and breasts.  You may develop headaches that can be relieved by medicines. The medicines should be approved by your health care provider.  You may urinate more often because the fetus is pressing on your bladder.  You may develop or continue to have heartburn as a result of your pregnancy.  You may develop constipation because certain hormones are causing the muscles that push waste through your intestines to slow down.  You may develop hemorrhoids or swollen, bulging veins (varicose veins).  You may have back pain. This is caused by: ? Weight gain. ? Pregnancy hormones that are relaxing the joints in your pelvis. ? A shift in weight and the muscles that support your balance.  Your breasts will continue to grow and they will continue to become tender.  Your gums may bleed and may be sensitive to brushing and flossing.  Dark spots or blotches (chloasma, mask of pregnancy) may develop on your face. This will likely fade after the baby is born.  A dark line from  your belly button to the pubic area (linea nigra) may appear. This will likely fade after the baby is born.  You may have changes in your hair. These can include thickening of your hair, rapid growth, and changes in texture. Some women also have hair loss during or after pregnancy, or hair that feels dry or thin. Your hair will most likely return to normal after your baby is born. What to expect at prenatal visits During a routine prenatal visit:  You will be weighed to make sure you and the fetus are growing normally.  Your blood pressure will be taken.  Your abdomen will be measured to track your baby's growth.  The fetal heartbeat will be listened to.  Any test results from the previous visit will be discussed. Your health care provider may ask you:  How you are feeling.  If you are feeling the baby move.  If you have had any abnormal symptoms, such as leaking fluid, bleeding, severe headaches, or abdominal cramping.  If you are using any tobacco products, including cigarettes, chewing tobacco, and electronic cigarettes.  If you have any questions. Other tests that may be performed during your second trimester include:  Blood tests that check for: ? Low iron levels (anemia). ? High blood sugar that affects pregnant women (gestational diabetes) between 24 and 28 weeks. ? Rh antibodies. This is to check for a protein on red blood cells (Rh factor).  Urine tests to check for infections, diabetes, or protein in   the urine.  An ultrasound to confirm the proper growth and development of the baby.  An amniocentesis to check for possible genetic problems.  Fetal screens for spina bifida and Down syndrome.  HIV (human immunodeficiency virus) testing. Routine prenatal testing includes screening for HIV, unless you choose not to have this test. Follow these instructions at home: Medicines  Follow your health care provider's instructions regarding medicine use. Specific medicines  may be either safe or unsafe to take during pregnancy.  Take a prenatal vitamin that contains at least 600 micrograms (mcg) of folic acid.  If you develop constipation, try taking a stool softener if your health care provider approves. Eating and drinking   Eat a balanced diet that includes fresh fruits and vegetables, whole grains, good sources of protein such as meat, eggs, or tofu, and low-fat dairy. Your health care provider will help you determine the amount of weight gain that is right for you.  Avoid raw meat and uncooked cheese. These carry germs that can cause birth defects in the baby.  If you have low calcium intake from food, talk to your health care provider about whether you should take a daily calcium supplement.  Limit foods that are high in fat and processed sugars, such as fried and sweet foods.  To prevent constipation: ? Drink enough fluid to keep your urine clear or pale yellow. ? Eat foods that are high in fiber, such as fresh fruits and vegetables, whole grains, and beans. Activity  Exercise only as directed by your health care provider. Most women can continue their usual exercise routine during pregnancy. Try to exercise for 30 minutes at least 5 days a week. Stop exercising if you experience uterine contractions.  Avoid heavy lifting, wear low heel shoes, and practice good posture.  A sexual relationship may be continued unless your health care provider directs you otherwise. Relieving pain and discomfort  Wear a good support bra to prevent discomfort from breast tenderness.  Take warm sitz baths to soothe any pain or discomfort caused by hemorrhoids. Use hemorrhoid cream if your health care provider approves.  Rest with your legs elevated if you have leg cramps or low back pain.  If you develop varicose veins, wear support hose. Elevate your feet for 15 minutes, 3-4 times a day. Limit salt in your diet. Prenatal Care  Write down your questions. Take  them to your prenatal visits.  Keep all your prenatal visits as told by your health care provider. This is important. Safety  Wear your seat belt at all times when driving.  Make a list of emergency phone numbers, including numbers for family, friends, the hospital, and police and fire departments. General instructions  Ask your health care provider for a referral to a local prenatal education class. Begin classes no later than the beginning of month 6 of your pregnancy.  Ask for help if you have counseling or nutritional needs during pregnancy. Your health care provider can offer advice or refer you to specialists for help with various needs.  Do not use hot tubs, steam rooms, or saunas.  Do not douche or use tampons or scented sanitary pads.  Do not cross your legs for long periods of time.  Avoid cat litter boxes and soil used by cats. These carry germs that can cause birth defects in the baby and possibly loss of the fetus by miscarriage or stillbirth.  Avoid all smoking, herbs, alcohol, and unprescribed drugs. Chemicals in these products can affect the   formation and growth of the baby.  Do not use any products that contain nicotine or tobacco, such as cigarettes and e-cigarettes. If you need help quitting, ask your health care provider.  Visit your dentist if you have not gone yet during your pregnancy. Use a soft toothbrush to brush your teeth and be gentle when you floss. Contact a health care provider if:  You have dizziness.  You have mild pelvic cramps, pelvic pressure, or nagging pain in the abdominal area.  You have persistent nausea, vomiting, or diarrhea.  You have a bad smelling vaginal discharge.  You have pain when you urinate. Get help right away if:  You have a fever.  You are leaking fluid from your vagina.  You have spotting or bleeding from your vagina.  You have severe abdominal cramping or pain.  You have rapid weight gain or weight loss.  You  have shortness of breath with chest pain.  You notice sudden or extreme swelling of your face, hands, ankles, feet, or legs.  You have not felt your baby move in over an hour.  You have severe headaches that do not go away when you take medicine.  You have vision changes. Summary  The second trimester is from week 14 through week 27 (months 4 through 6). It is also a time when the fetus is growing rapidly.  Your body goes through many changes during pregnancy. The changes vary from woman to woman.  Avoid all smoking, herbs, alcohol, and unprescribed drugs. These chemicals affect the formation and growth your baby.  Do not use any tobacco products, such as cigarettes, chewing tobacco, and e-cigarettes. If you need help quitting, ask your health care provider.  Contact your health care provider if you have any questions. Keep all prenatal visits as told by your health care provider. This is important. This information is not intended to replace advice given to you by your health care provider. Make sure you discuss any questions you have with your health care provider. Document Revised: 01/16/2019 Document Reviewed: 10/30/2016 [employer] Patient Education  2020 [employer]   Contraception Choices Contraception, also called birth control, refers to methods or devices that prevent pregnancy. Hormonal methods Contraceptive implant  A contraceptive implant is a thin, plastic tube that contains a hormone. It is inserted into the upper part of the arm. It can remain in place for up to 3 years. Progestin-only injections Progestin-only injections are injections of progestin, a synthetic form of the hormone progesterone. They are given every 3 months by a health care provider. Birth control pills  Birth control pills are pills that contain hormones that prevent pregnancy. They must be taken once a day, preferably at the same time each day. Birth control patch  The birth control patch  contains hormones that prevent pregnancy. It is placed on the skin and must be changed once a week for three weeks and removed on the fourth week. A prescription is needed to use this method of contraception. Vaginal ring  A vaginal ring contains hormones that prevent pregnancy. It is placed in the vagina for three weeks and removed on the fourth week. After that, the process is repeated with a new ring. A prescription is needed to use this method of contraception. Emergency contraceptive Emergency contraceptives prevent pregnancy after unprotected sex. They come in pill form and can be taken up to 5 days after sex. They work best the sooner they are taken after having sex. Most emergency contraceptives are available   without a prescription. This method should not be used as your only form of birth control. Barrier methods Female condom  A female condom is a thin sheath that is worn over the penis during sex. Condoms keep sperm from going inside a woman's body. They can be used with a spermicide to increase their effectiveness. They should be disposed after a single use. Female condom  A female condom is a soft, loose-fitting sheath that is put into the vagina before sex. The condom keeps sperm from going inside a woman's body. They should be disposed after a single use. Diaphragm  A diaphragm is a soft, dome-shaped barrier. It is inserted into the vagina before sex, along with a spermicide. The diaphragm blocks sperm from entering the uterus, and the spermicide kills sperm. A diaphragm should be left in the vagina for 6-8 hours after sex and removed within 24 hours. A diaphragm is prescribed and fitted by a health care provider. A diaphragm should be replaced every 1-2 years, after giving birth, after gaining more than 15 lb (6.8 kg), and after pelvic surgery. Cervical cap  A cervical cap is a round, soft latex or plastic cup that fits over the cervix. It is inserted into the vagina before sex, along  with spermicide. It blocks sperm from entering the uterus. The cap should be left in place for 6-8 hours after sex and removed within 48 hours. A cervical cap must be prescribed and fitted by a health care provider. It should be replaced every 2 years. Sponge  A sponge is a soft, circular piece of polyurethane foam with spermicide on it. The sponge helps block sperm from entering the uterus, and the spermicide kills sperm. To use it, you make it wet and then insert it into the vagina. It should be inserted before sex, left in for at least 6 hours after sex, and removed and thrown away within 30 hours. Spermicides Spermicides are chemicals that kill or block sperm from entering the cervix and uterus. They can come as a cream, jelly, suppository, foam, or tablet. A spermicide should be inserted into the vagina with an applicator at least 10-15 minutes before sex to allow time for it to work. The process must be repeated every time you have sex. Spermicides do not require a prescription. Intrauterine contraception Intrauterine device (IUD) An IUD is a T-shaped device that is put in a woman's uterus. There are two types:  Hormone IUD.This type contains progestin, a synthetic form of the hormone progesterone. This type can stay in place for 3-5 years.  Copper IUD.This type is wrapped in copper wire. It can stay in place for 10 years.  Permanent methods of contraception Female tubal ligation In this method, a woman's fallopian tubes are sealed, tied, or blocked during surgery to prevent eggs from traveling to the uterus. Hysteroscopic sterilization In this method, a small, flexible insert is placed into each fallopian tube. The inserts cause scar tissue to form in the fallopian tubes and block them, so sperm cannot reach an egg. The procedure takes about 3 months to be effective. Another form of birth control must be used during those 3 months. Female sterilization This is a procedure to tie off the  tubes that carry sperm (vasectomy). After the procedure, the man can still ejaculate fluid (semen). Natural planning methods Natural family planning In this method, a couple does not have sex on days when the woman could become pregnant. Calendar method This means keeping track of the length   of each menstrual cycle, identifying the days when pregnancy can happen, and not having sex on those days. Ovulation method In this method, a couple avoids sex during ovulation. Symptothermal method This method involves not having sex during ovulation. The woman typically checks for ovulation by watching changes in her temperature and in the consistency of cervical mucus. Post-ovulation method In this method, a couple waits to have sex until after ovulation. Summary  Contraception, also called birth control, means methods or devices that prevent pregnancy.  Hormonal methods of contraception include implants, injections, pills, patches, vaginal rings, and emergency contraceptives.  Barrier methods of contraception can include female condoms, female condoms, diaphragms, cervical caps, sponges, and spermicides.  There are two types of IUDs (intrauterine devices). An IUD can be put in a woman's uterus to prevent pregnancy for 3-5 years.  Permanent sterilization can be done through a procedure for males, females, or both.  Natural family planning methods involve not having sex on days when the woman could become pregnant. This information is not intended to replace advice given to you by your health care provider. Make sure you discuss any questions you have with your health care provider. Document Revised: 09/26/2017 Document Reviewed: 10/27/2016 Else[employer] Patient Education  2020 [employer]   Breastfeeding  Choosing to breastfeed is one of the best decisions you can make for yourself and your baby. A change in hormones during pregnancy causes your breasts to make breast milk in your milk-producing  glands. Hormones prevent breast milk from being released before your baby is born. They also prompt milk flow after birth. Once breastfeeding has begun, thoughts of your baby, as well as his or her sucking or crying, can stimulate the release of milk from your milk-producing glands. Benefits of breastfeeding Research shows that breastfeeding offers many health benefits for infants and mothers. It also offers a cost-free and convenient way to feed your baby. For your baby  Your first milk (colostrum) helps your baby's digestive system to function better.  Special cells in your milk (antibodies) help your baby to fight off infections.  Breastfed babies are less likely to develop asthma, allergies, obesity, or type 2 diabetes. They are also at lower risk for sudden infant death syndrome (SIDS).  Nutrients in breast milk are better able to meet your baby's needs compared to infant formula.  Breast milk improves your baby's brain development. For you  Breastfeeding helps to create a very special bond between you and your baby.  Breastfeeding is convenient. Breast milk costs nothing and is always available at the correct temperature.  Breastfeeding helps to burn calories. It helps you to lose the weight that you gained during pregnancy.  Breastfeeding makes your uterus return faster to its size before pregnancy. It also slows bleeding (lochia) after you give birth.  Breastfeeding helps to lower your risk of developing type 2 diabetes, osteoporosis, rheumatoid arthritis, cardiovascular disease, and breast, ovarian, uterine, and endometrial cancer later in life. Breastfeeding basics Starting breastfeeding  Find a comfortable place to sit or lie down, with your neck and back well-supported.  Place a pillow or a rolled-up blanket under your baby to bring him or her to the level of your breast (if you are seated). Nursing pillows are specially designed to help support your arms and your baby while  you breastfeed.  Make sure that your baby's tummy (abdomen) is facing your abdomen.  Gently massage your breast. With your fingertips, massage from the outer edges of your breast inward toward   the nipple. This encourages milk flow. If your milk flows slowly, you may need to continue this action during the feeding.  Support your breast with 4 fingers underneath and your thumb above your nipple (make the letter "C" with your hand). Make sure your fingers are well away from your nipple and your baby's mouth.  Stroke your baby's lips gently with your finger or nipple.  When your baby's mouth is open wide enough, quickly bring your baby to your breast, placing your entire nipple and as much of the areola as possible into your baby's mouth. The areola is the colored area around your nipple. ? More areola should be visible above your baby's upper lip than below the lower lip. ? Your baby's lips should be opened and extended outward (flanged) to ensure an adequate, comfortable latch. ? Your baby's tongue should be between his or her lower gum and your breast.  Make sure that your baby's mouth is correctly positioned around your nipple (latched). Your baby's lips should create a seal on your breast and be turned out (everted).  It is common for your baby to suck about 2-3 minutes in order to start the flow of breast milk. Latching Teaching your baby how to latch onto your breast properly is very important. An improper latch can cause nipple pain, decreased milk supply, and poor weight gain in your baby. Also, if your baby is not latched onto your nipple properly, he or she may swallow some air during feeding. This can make your baby fussy. Burping your baby when you switch breasts during the feeding can help to get rid of the air. However, teaching your baby to latch on properly is still the best way to prevent fussiness from swallowing air while breastfeeding. Signs that your baby has successfully  latched onto your nipple  Silent tugging or silent sucking, without causing you pain. Infant's lips should be extended outward (flanged).  Swallowing heard between every 3-4 sucks once your milk has started to flow (after your let-down milk reflex occurs).  Muscle movement above and in front of his or her ears while sucking. Signs that your baby has not successfully latched onto your nipple  Sucking sounds or smacking sounds from your baby while breastfeeding.  Nipple pain. If you think your baby has not latched on correctly, slip your finger into the corner of your baby's mouth to break the suction and place it between your baby's gums. Attempt to start breastfeeding again. Signs of successful breastfeeding Signs from your baby  Your baby will gradually decrease the number of sucks or will completely stop sucking.  Your baby will fall asleep.  Your baby's body will relax.  Your baby will retain a small amount of milk in his or her mouth.  Your baby will let go of your breast by himself or herself. Signs from you  Breasts that have increased in firmness, weight, and size 1-3 hours after feeding.  Breasts that are softer immediately after breastfeeding.  Increased milk volume, as well as a change in milk consistency and color by the fifth day of breastfeeding.  Nipples that are not sore, cracked, or bleeding. Signs that your baby is getting enough milk  Wetting at least 1-2 diapers during the first 24 hours after birth.  Wetting at least 5-6 diapers every 24 hours for the first week after birth. The urine should be clear or pale yellow by the age of 5 days.  Wetting 6-8 diapers every 24 hours as   your baby continues to grow and develop.  At least 3 stools in a 24-hour period by the age of 5 days. The stool should be soft and yellow.  At least 3 stools in a 24-hour period by the age of 7 days. The stool should be seedy and yellow.  No loss of weight greater than 10% of  birth weight during the first 3 days of life.  Average weight gain of 4-7 oz (113-198 g) per week after the age of 4 days.  Consistent daily weight gain by the age of 5 days, without weight loss after the age of 2 weeks. After a feeding, your baby may spit up a small amount of milk. This is normal. Breastfeeding frequency and duration Frequent feeding will help you make more milk and can prevent sore nipples and extremely full breasts (breast engorgement). Breastfeed when you feel the need to reduce the fullness of your breasts or when your baby shows signs of hunger. This is called "breastfeeding on demand." Signs that your baby is hungry include:  Increased alertness, activity, or restlessness.  Movement of the head from side to side.  Opening of the mouth when the corner of the mouth or cheek is stroked (rooting).  Increased sucking sounds, smacking lips, cooing, sighing, or squeaking.  Hand-to-mouth movements and sucking on fingers or hands.  Fussing or crying. Avoid introducing a pacifier to your baby in the first 4-6 weeks after your baby is born. After this time, you may choose to use a pacifier. Research has shown that pacifier use during the first year of a baby's life decreases the risk of sudden infant death syndrome (SIDS). Allow your baby to feed on each breast as long as he or she wants. When your baby unlatches or falls asleep while feeding from the first breast, offer the second breast. Because newborns are often sleepy in the first few weeks of life, you may need to awaken your baby to get him or her to feed. Breastfeeding times will vary from baby to baby. However, the following rules can serve as a guide to help you make sure that your baby is properly fed:  Newborns (babies 4 weeks of age or younger) may breastfeed every 1-3 hours.  Newborns should not go without breastfeeding for longer than 3 hours during the day or 5 hours during the night.  You should breastfeed  your baby a minimum of 8 times in a 24-hour period. Breast milk pumping     Pumping and storing breast milk allows you to make sure that your baby is exclusively fed your breast milk, even at times when you are unable to breastfeed. This is especially important if you go back to work while you are still breastfeeding, or if you are not able to be present during feedings. Your lactation consultant can help you find a method of pumping that works best for you and give you guidelines about how long it is safe to store breast milk. Caring for your breasts while you breastfeed Nipples can become dry, cracked, and sore while breastfeeding. The following recommendations can help keep your breasts moisturized and healthy:  Avoid using soap on your nipples.  Wear a supportive bra designed especially for nursing. Avoid wearing underwire-style bras or extremely tight bras (sports bras).  Air-dry your nipples for 3-4 minutes after each feeding.  Use only cotton bra pads to absorb leaked breast milk. Leaking of breast milk between feedings is normal.  Use lanolin on your nipples   after breastfeeding. Lanolin helps to maintain your skin's normal moisture barrier. Pure lanolin is not harmful (not toxic) to your baby. You may also hand express a few drops of breast milk and gently massage that milk into your nipples and allow the milk to air-dry. In the first few weeks after giving birth, some women experience breast engorgement. Engorgement can make your breasts feel heavy, warm, and tender to the touch. Engorgement peaks within 3-5 days after you give birth. The following recommendations can help to ease engorgement:  Completely empty your breasts while breastfeeding or pumping. You may want to start by applying warm, moist heat (in the shower or with warm, water-soaked hand towels) just before feeding or pumping. This increases circulation and helps the milk flow. If your baby does not completely empty your  breasts while breastfeeding, pump any extra milk after he or she is finished.  Apply ice packs to your breasts immediately after breastfeeding or pumping, unless this is too uncomfortable for you. To do this: ? Put ice in a plastic bag. ? Place a towel between your skin and the bag. ? Leave the ice on for 20 minutes, 2-3 times a day.  Make sure that your baby is latched on and positioned properly while breastfeeding. If engorgement persists after 48 hours of following these recommendations, contact your health care provider or a lactation consultant. Overall health care recommendations while breastfeeding  Eat 3 healthy meals and 3 snacks every day. Well-nourished mothers who are breastfeeding need an additional 450-500 calories a day. You can meet this requirement by increasing the amount of a balanced diet that you eat.  Drink enough water to keep your urine pale yellow or clear.  Rest often, relax, and continue to take your prenatal vitamins to prevent fatigue, stress, and low vitamin and mineral levels in your body (nutrient deficiencies).  Do not use any products that contain nicotine or tobacco, such as cigarettes and e-cigarettes. Your baby may be harmed by chemicals from cigarettes that pass into breast milk and exposure to secondhand smoke. If you need help quitting, ask your health care provider.  Avoid alcohol.  Do not use illegal drugs or marijuana.  Talk with your health care provider before taking any medicines. These include over-the-counter and prescription medicines as well as vitamins and herbal supplements. Some medicines that may be harmful to your baby can pass through breast milk.  It is possible to become pregnant while breastfeeding. If birth control is desired, ask your health care provider about options that will be safe while breastfeeding your baby. Where to find more information: [employer]: www.llli.org Contact a health care provider  if:  You feel like you want to stop breastfeeding or have become frustrated with breastfeeding.  Your nipples are cracked or bleeding.  Your breasts are red, tender, or warm.  You have: ? Painful breasts or nipples. ? A swollen area on either breast. ? A fever or chills. ? Nausea or vomiting. ? Drainage other than breast milk from your nipples.  Your breasts do not become full before feedings by the fifth day after you give birth.  You feel sad and depressed.  Your baby is: ? Too sleepy to eat well. ? Having trouble sleeping. ? More than 1 week old and wetting fewer than 6 diapers in a 24-hour period. ? Not gaining weight by 5 days of age.  Your baby has fewer than 3 stools in a 24-hour period.  Your baby's skin or   the white parts of his or her eyes become yellow. Get help right away if:  Your baby is overly tired (lethargic) and does not want to wake up and feed.  Your baby develops an unexplained fever. Summary  Breastfeeding offers many health benefits for infant and mothers.  Try to breastfeed your infant when he or she shows early signs of hunger.  Gently tickle or stroke your baby's lips with your finger or nipple to allow the baby to open his or her mouth. Bring the baby to your breast. Make sure that much of the areola is in your baby's mouth. Offer one side and burp the baby before you offer the other side.  Talk with your health care provider or lactation consultant if you have questions or you face problems as you breastfeed. This information is not intended to replace advice given to you by your health care provider. Make sure you discuss any questions you have with your health care provider. Document Revised: 12/19/2017 Document Reviewed: 10/26/2016 Elsevier Patient Education  2020 Elsevier Inc.  

## 2020-07-14 LAB — COMPREHENSIVE METABOLIC PANEL
ALT: 29 IU/L (ref 0–32)
AST: 30 IU/L (ref 0–40)
Albumin/Globulin Ratio: 1.3 (ref 1.2–2.2)
Albumin: 4.2 g/dL (ref 3.8–4.8)
Alkaline Phosphatase: 103 IU/L (ref 44–121)
BUN/Creatinine Ratio: 20 (ref 9–23)
BUN: 9 mg/dL (ref 6–20)
Bilirubin Total: <0.2 mg/dL (ref 0.0–1.2)
CO2: 18 mmol/L — ABNORMAL LOW (ref 20–29)
Calcium: 9.8 mg/dL (ref 8.7–10.2)
Chloride: 98 mmol/L (ref 96–106)
Creatinine, Ser: 0.45 mg/dL — ABNORMAL LOW (ref 0.57–1.00)
GFR calc Af Amer: 151 mL/min/{1.73_m2} (ref >59)
GFR calc non Af Amer: 131 mL/min/{1.73_m2} (ref >59)
Globulin, Total: 3.2 g/dL (ref 1.5–4.5)
Glucose: 144 mg/dL — ABNORMAL HIGH (ref 65–99)
Potassium: 4.2 mmol/L (ref 3.5–5.2)
Sodium: 132 mmol/L — ABNORMAL LOW (ref 134–144)
Total Protein: 7.4 g/dL (ref 6.0–8.5)

## 2020-07-14 LAB — PROTEIN / CREATININE RATIO, URINE
Creatinine, Urine: 157.9 mg/dL
Protein, Ur: 26.9 mg/dL
Protein/Creat Ratio: 170 mg/g creat (ref 0–200)

## 2020-07-14 LAB — CERVICOVAGINAL ANCILLARY ONLY
Bacterial Vaginitis (gardnerella): NEGATIVE
Candida Glabrata: NEGATIVE
Candida Vaginitis: NEGATIVE
Chlamydia: NEGATIVE
Comment: NEGATIVE
Comment: NEGATIVE
Comment: NEGATIVE
Comment: NEGATIVE
Comment: NEGATIVE
Comment: NORMAL
Neisseria Gonorrhea: NEGATIVE
Trichomonas: NEGATIVE

## 2020-07-14 LAB — CBC/D/PLT+RPR+RH+ABO+RUB AB...
Antibody Screen: NEGATIVE
Basophils Absolute: 0.1 10*3/uL (ref 0.0–0.2)
Basos: 1 %
EOS (ABSOLUTE): 0.4 10*3/uL (ref 0.0–0.4)
Eos: 3 %
HCV Ab: <0.1 s/co ratio (ref 0.0–0.9)
HIV Screen 4th Generation wRfx: NONREACTIVE
Hematocrit: 39 % (ref 34.0–46.6)
Hemoglobin: 12.9 g/dL (ref 11.1–15.9)
Hepatitis B Surface Ag: NEGATIVE
Immature Grans (Abs): 0.1 10*3/uL (ref 0.0–0.1)
Immature Granulocytes: 1 %
Lymphocytes Absolute: 2.8 10*3/uL (ref 0.7–3.1)
Lymphs: 24 %
MCH: 26.4 pg — ABNORMAL LOW (ref 26.6–33.0)
MCHC: 33.1 g/dL (ref 31.5–35.7)
MCV: 80 fL (ref 79–97)
Monocytes Absolute: 0.7 10*3/uL (ref 0.1–0.9)
Monocytes: 6 %
Neutrophils Absolute: 7.6 10*3/uL — ABNORMAL HIGH (ref 1.4–7.0)
Neutrophils: 65 %
Platelets: 368 10*3/uL (ref 150–450)
RBC: 4.89 x10E6/uL (ref 3.77–5.28)
RDW: 14.6 % (ref 11.7–15.4)
RPR Ser Ql: NONREACTIVE
Rh Factor: POSITIVE
Rubella Antibodies, IGG: 2.65 index (ref >0.99)
WBC: 11.6 10*3/uL — ABNORMAL HIGH (ref 3.4–10.8)

## 2020-07-14 LAB — HEMOGLOBIN A1C
Est. average glucose Bld gHb Est-mCnc: 183 mg/dL
Hgb A1c MFr Bld: 8 % — ABNORMAL HIGH (ref 4.8–5.6)

## 2020-07-14 LAB — HCV INTERPRETATION

## 2020-07-15 ENCOUNTER — Other Ambulatory Visit: Payer: Self-pay

## 2020-07-15 DIAGNOSIS — O24319 Unspecified pre-existing diabetes mellitus in pregnancy, unspecified trimester: Secondary | ICD-10-CM | POA: Insufficient documentation

## 2020-07-15 LAB — URINE CULTURE, OB REFLEX

## 2020-07-15 LAB — CULTURE, OB URINE

## 2020-07-15 NOTE — Telephone Encounter (Signed)
Patient is now has GDM. Testing supplies  Sent to pharmacy and patient will be refer to Diabetes Educator.

## 2020-07-15 NOTE — Addendum Note (Signed)
Addended by: Mora Bellman on: 07/15/2020 08:55 AM   Modules accepted: Orders

## 2020-07-18 LAB — CYTOLOGY - PAP
Comment: NEGATIVE
Diagnosis: NEGATIVE
HPV 16: NEGATIVE
HPV 18 / 45: NEGATIVE
High risk HPV: POSITIVE — AB

## 2020-07-20 ENCOUNTER — Encounter: Payer: Medicare Other | Attending: Obstetrics and Gynecology | Admitting: Registered"

## 2020-07-20 DIAGNOSIS — O24311 Unspecified pre-existing diabetes mellitus in pregnancy, first trimester: Secondary | ICD-10-CM | POA: Insufficient documentation

## 2020-07-20 DIAGNOSIS — O0991 Supervision of high risk pregnancy, unspecified, first trimester: Secondary | ICD-10-CM | POA: Insufficient documentation

## 2020-07-20 DIAGNOSIS — Z3A00 Weeks of gestation of pregnancy not specified: Secondary | ICD-10-CM | POA: Insufficient documentation

## 2020-07-20 DIAGNOSIS — Z713 Dietary counseling and surveillance: Secondary | ICD-10-CM | POA: Insufficient documentation

## 2020-07-20 DIAGNOSIS — O24319 Unspecified pre-existing diabetes mellitus in pregnancy, unspecified trimester: Secondary | ICD-10-CM | POA: Insufficient documentation

## 2020-07-25 ENCOUNTER — Encounter: Payer: Self-pay | Admitting: Obstetrics and Gynecology

## 2020-08-01 ENCOUNTER — Other Ambulatory Visit: Payer: Self-pay | Admitting: *Deleted

## 2020-08-01 ENCOUNTER — Encounter: Payer: Medicare Other | Admitting: Registered"

## 2020-08-01 ENCOUNTER — Other Ambulatory Visit: Payer: Self-pay

## 2020-08-01 ENCOUNTER — Encounter: Payer: Self-pay | Admitting: Registered"

## 2020-08-01 DIAGNOSIS — O24311 Unspecified pre-existing diabetes mellitus in pregnancy, first trimester: Secondary | ICD-10-CM | POA: Diagnosis not present

## 2020-08-01 DIAGNOSIS — O24319 Unspecified pre-existing diabetes mellitus in pregnancy, unspecified trimester: Secondary | ICD-10-CM

## 2020-08-01 DIAGNOSIS — O0991 Supervision of high risk pregnancy, unspecified, first trimester: Secondary | ICD-10-CM | POA: Diagnosis not present

## 2020-08-01 DIAGNOSIS — Z3A00 Weeks of gestation of pregnancy not specified: Secondary | ICD-10-CM | POA: Diagnosis not present

## 2020-08-01 DIAGNOSIS — Z713 Dietary counseling and surveillance: Secondary | ICD-10-CM | POA: Diagnosis not present

## 2020-08-01 MED ORDER — CONTOUR NEXT TEST VI STRP: ORAL_STRIP | 5 refills | Status: DC

## 2020-08-01 NOTE — Progress Notes (Signed)
Test strips ordered today per A.Johnston,RD

## 2020-08-01 NOTE — Progress Notes (Signed)
Patient was seen on 08/01/20 for Diabetes in pregnancy self-management. EDD 01/26/21 (twins). Patient states no history of GDM. Patient states she was told has pre-diabetes last year with an A1c of "6-something" at [facility].  Pt reports blood sugar monitoring prior to pregnancy. Pt states she has been out of strips for 2 weeks so does not have recent data. Pt reports prior to running out of strips, FBS 170 mg/dL and post meal 299 mg/dL.  Diet history obtained. Patient main source of excess carbohydrates comes from orange juice (~24 oz), sweet tea, and fast food. Pt states her fiance is interested in changing to a vegan diet.  Patient reports no structured physical activity due to fatigue. Pt reports she has not been back to work for 3 weeks due to fatigue and before that was working 40+ hours per week. Pt states she takes iron supplement every other day to lessen constipation effect.  The following learning objectives were met by the patient :   States the definition of Gestational Diabetes vs T2DM  States why dietary management is important in controlling blood glucose  Describes the effects of carbohydrates on blood glucose levels  Demonstrates ability to create a balanced meal plan  Demonstrates carbohydrate counting   States when to check blood glucose levels  Demonstrates proper blood glucose monitoring techniques  States the effect of stress and exercise on blood glucose levels  States the importance of limiting caffeine and abstaining from alcohol and smoking  Plan:  Aim for 3 Carbohydrate Choices per meal (45 grams) +/- 1 either way  Aim for 1-2 Carbohydrate Choices per snack Begin reading food labels for Total Carbohydrate of foods If OK with your MD, consider  increasing your activity level by walking, Arm Chair Exercises or other activity daily as tolerated Begin checking Blood Glucose before breakfast and 2 hours after first bite of breakfast, lunch and dinner as  directed by MD  Bring Log Book/Sheet and meter to every medical appointment  Baby Scripts: Patient is using Baby Scripts set up by [facility] at [facility], but is not using to track blood sugar. If she was enrolled in 2.0 version will not be able to use BS for CBGs. RD provided paper log sheet and  Requested she upload a screen shot of her readings through Thursday morning so RD can review again. Take medication if directed by MD  Additional information provided regarding insulin because it is likely she will need insulin to manage. Please refer patient for insulin visit if you would like her to start on insulin. Pt states fear of needles, patient states interest in using Omnipod. RD discussed need for additional appointment as well as a minimum of weekly contact with healthcare team while we fine tune pod settings to achieve glucose control.  Patient already has a meter, but needs more strips. RD sent request to [facility]   Pt reported same CBS: FBS 170 mg/dL; after meals 299 mg/dL   Patient was provided Omnipod DASH demo to try out and if she is interested to call insurance to see if it is covered.   Patient instructed to monitor glucose levels: FBS: 60 - 95 mg/dl 2 hour: <120 mg/dl  Patient received the following handouts:  Nutrition Diabetes and Pregnancy  Carbohydrate Counting List  Blood glucose Log Sheet  Patient will be seen for follow-up as needed.

## 2020-08-04 ENCOUNTER — Other Ambulatory Visit: Payer: Self-pay

## 2020-08-04 MED ORDER — ACCU-CHEK SOFTCLIX LANCETS MISC: 12 refills | Status: AC

## 2020-08-04 MED ORDER — ACCU-CHEK GUIDE VI STRP: ORAL_STRIP | 12 refills | Status: AC

## 2020-08-04 MED ORDER — ACCU-CHEK GUIDE W/DEVICE KIT: 1.0000 | PACK | Freq: Four times a day (QID) | 0 refills | Status: AC

## 2020-08-04 NOTE — Progress Notes (Signed)
Diabetic supplies sent to pt pharmacy, pt made aware.

## 2020-08-08 ENCOUNTER — Other Ambulatory Visit: Payer: Self-pay

## 2020-08-08 ENCOUNTER — Inpatient Hospital Stay (HOSPITAL_COMMUNITY)
Admission: AD | Admit: 2020-08-08 | Discharge: 2020-08-08 | Disposition: A | Discharge status: Home / Self Care | Payer: Medicare Other | Attending: Family Medicine | Admitting: Family Medicine

## 2020-08-08 DIAGNOSIS — R109 Unspecified abdominal pain: Secondary | ICD-10-CM | POA: Insufficient documentation

## 2020-08-08 DIAGNOSIS — Z3A15 15 weeks gestation of pregnancy: Secondary | ICD-10-CM | POA: Diagnosis not present

## 2020-08-08 DIAGNOSIS — O26892 Other specified pregnancy related conditions, second trimester: Secondary | ICD-10-CM | POA: Insufficient documentation

## 2020-08-08 DIAGNOSIS — Z711 Person with feared health complaint in whom no diagnosis is made: Secondary | ICD-10-CM

## 2020-08-08 DIAGNOSIS — O30042 Twin pregnancy, dichorionic/diamniotic, second trimester: Secondary | ICD-10-CM | POA: Insufficient documentation

## 2020-08-08 DIAGNOSIS — O30049 Twin pregnancy, dichorionic/diamniotic, unspecified trimester: Secondary | ICD-10-CM

## 2020-08-08 NOTE — MAU Note (Signed)
Thinks she ate some bad crab legs yesterday.  Has been having cramping and sharp pains since this morning. Up until 2p.m the pian was a 10. No bleeding. Just nausea. No diarrhea or vomiting.  Called her dr, they asked if she had felt the babies move, she hasn't so they told her to come get checked out.

## 2020-08-08 NOTE — MAU Provider Note (Signed)
First Provider Initiated Contact with Patient 08/08/20 1743      S [name] is a 34 y.o. [id number] patient who presents to MAU today with complaint of abdominal pain earlier that has resolved. She called the office and was instructed to come here. She thought she might have eaten some "bad crab legs yesterday". No vomiting, nausea, abdominal pain.   O BP 127/68 (BP Location: Right Arm)   Pulse 97   Temp 99 F (37.2 C) (Oral)   Resp 18   Wt 89.3 kg   LMP 04/21/2020   SpO2 100%   BMI 33.80 kg/m  Physical Exam Vitals reviewed.  Constitutional:      Appearance: She is well-developed.  Abdominal:     General: Abdomen is flat. Bowel sounds are normal.     Palpations: Abdomen is soft.  Skin:    General: Skin is warm and dry.     Capillary Refill: Capillary refill takes less than 2 seconds.  Neurological:     General: No focal deficit present.     Mental Status: She is alert.  Psychiatric:        Mood and Affect: Mood normal.        Behavior: Behavior normal.     A Medical screening exam complete Physically well, but worried [redacted] week gestation Di/Di twins  P Discharge from MAU in stable condition Warning signs for worsening condition that would warrant emergency follow-up discussed Patient may return to MAU as needed   Truett Mainland, DO 08/08/2020 5:43 PM

## 2020-08-08 NOTE — Discharge Instructions (Signed)
If you start vomiting, having repeat watery stools or running a few- return to MAU   Second Trimester of Pregnancy  The second trimester is from week 14 through week 27 (month 4 through 6). This is often the time in pregnancy that you feel your best. Often times, morning sickness has lessened or quit. You may have more energy, and you may get hungry more often. Your unborn baby is growing rapidly. At the end of the sixth month, he or she is about 9 inches long and weighs about 1 pounds. You will likely feel the baby move between 18 and 20 weeks of pregnancy. Follow these instructions at home: Medicines  Take over-the-counter and prescription medicines only as told by your doctor. Some medicines are safe and some medicines are not safe during pregnancy.  Take a prenatal vitamin that contains at least 600 micrograms (mcg) of folic acid.  If you have trouble pooping (constipation), take medicine that will make your stool soft (stool softener) if your doctor approves. Eating and drinking   Eat regular, healthy meals.  Avoid raw meat and uncooked cheese.  If you get low calcium from the food you eat, talk to your doctor about taking a daily calcium supplement.  Avoid foods that are high in fat and sugars, such as fried and sweet foods.  If you feel sick to your stomach (nauseous) or throw up (vomit): ? Eat 4 or 5 small meals a day instead of 3 large meals. ? Try eating a few soda crackers. ? Drink liquids between meals instead of during meals.  To prevent constipation: ? Eat foods that are high in fiber, like fresh fruits and vegetables, whole grains, and beans. ? Drink enough fluids to keep your pee (urine) clear or pale yellow. Activity  Exercise only as told by your doctor. Stop exercising if you start to have cramps.  Do not exercise if it is too hot, too humid, or if you are in a place of great height (high altitude).  Avoid heavy lifting.  Wear low-heeled shoes. Sit and  stand up straight.  You can continue to have sex unless your doctor tells you not to. Relieving pain and discomfort  Wear a good support bra if your breasts are tender.  Take warm water baths (sitz baths) to soothe pain or discomfort caused by hemorrhoids. Use hemorrhoid cream if your doctor approves.  Rest with your legs raised if you have leg cramps or low back pain.  If you develop puffy, bulging veins (varicose veins) in your legs: ? Wear support hose or compression stockings as told by your doctor. ? Raise (elevate) your feet for 15 minutes, 3-4 times a day. ? Limit salt in your food. Prenatal care  Write down your questions. Take them to your prenatal visits.  Keep all your prenatal visits as told by your doctor. This is important. Safety  Wear your seat belt when driving.  Make a list of emergency phone numbers, including numbers for family, friends, the hospital, and police and fire departments. General instructions  Ask your doctor about the right foods to eat or for help finding a counselor, if you need these services.  Ask your doctor about local prenatal classes. Begin classes before month 6 of your pregnancy.  Do not use hot tubs, steam rooms, or saunas.  Do not douche or use tampons or scented sanitary pads.  Do not cross your legs for long periods of time.  Visit your dentist if you have not  done so. Use a soft toothbrush to brush your teeth. Floss gently.  Avoid all smoking, herbs, and alcohol. Avoid drugs that are not approved by your doctor.  Do not use any products that contain nicotine or tobacco, such as cigarettes and e-cigarettes. If you need help quitting, ask your doctor.  Avoid cat litter boxes and soil used by cats. These carry germs that can cause birth defects in the baby and can cause a loss of your baby (miscarriage) or stillbirth. Contact a doctor if:  You have mild cramps or pressure in your lower belly.  You have pain when you pee  (urinate).  You have bad smelling fluid coming from your vagina.  You continue to feel sick to your stomach (nauseous), throw up (vomit), or have watery poop (diarrhea).  You have a nagging pain in your belly area.  You feel dizzy. Get help right away if:  You have a fever.  You are leaking fluid from your vagina.  You have spotting or bleeding from your vagina.  You have severe belly cramping or pain.  You lose or gain weight rapidly.  You have trouble catching your breath and have chest pain.  You notice sudden or extreme puffiness (swelling) of your face, hands, ankles, feet, or legs.  You have not felt the baby move in over an hour.  You have severe headaches that do not go away when you take medicine.  You have trouble seeing. Summary  The second trimester is from week 14 through week 27 (months 4 through 6). This is often the time in pregnancy that you feel your best.  To take care of yourself and your unborn baby, you will need to eat healthy meals, take medicines only if your doctor tells you to do so, and do activities that are safe for you and your baby.  Call your doctor if you get sick or if you notice anything unusual about your pregnancy. Also, call your doctor if you need help with the right food to eat, or if you want to know what activities are safe for you. This information is not intended to replace advice given to you by your health care provider. Make sure you discuss any questions you have with your health care provider. Document Revised: 01/16/2019 Document Reviewed: 10/30/2016 Elsevier Patient Education  [place].

## 2020-08-10 ENCOUNTER — Encounter: Payer: Self-pay | Admitting: Obstetrics

## 2020-08-10 ENCOUNTER — Other Ambulatory Visit: Payer: Self-pay

## 2020-08-10 ENCOUNTER — Ambulatory Visit (INDEPENDENT_AMBULATORY_CARE_PROVIDER_SITE_OTHER): Payer: BC Managed Care – PPO | Admitting: Advanced Practice Midwife

## 2020-08-10 VITALS — BP 111/76 | HR 88 | Wt 196.0 lb

## 2020-08-10 DIAGNOSIS — R12 Heartburn: Secondary | ICD-10-CM

## 2020-08-10 DIAGNOSIS — Z3A15 15 weeks gestation of pregnancy: Secondary | ICD-10-CM

## 2020-08-10 DIAGNOSIS — O24319 Unspecified pre-existing diabetes mellitus in pregnancy, unspecified trimester: Secondary | ICD-10-CM

## 2020-08-10 DIAGNOSIS — O26892 Other specified pregnancy related conditions, second trimester: Secondary | ICD-10-CM

## 2020-08-10 DIAGNOSIS — O30049 Twin pregnancy, dichorionic/diamniotic, unspecified trimester: Secondary | ICD-10-CM

## 2020-08-10 DIAGNOSIS — O0991 Supervision of high risk pregnancy, unspecified, first trimester: Secondary | ICD-10-CM

## 2020-08-10 NOTE — Progress Notes (Signed)
   PRENATAL VISIT NOTE  Subjective:  Tiffany Hall is a 34 y.o. [id number] at [redacted]w[redacted]d being seen today for ongoing prenatal care.  She is currently monitored for the following issues for this high-risk pregnancy and has Pregnancy; Preeclampsia in postpartum period; Supervision of high-risk pregnancy; Twin pregnancy, antepartum condition or complication; Maternal obesity affecting pregnancy, antepartum; History of pre-eclampsia in prior pregnancy, currently pregnant; and Pre-existing diabetes mellitus affecting pregnancy, antepartum on their problem list.  Patient reports heartburn.  Contractions: Not present. Vag. Bleeding: None.  Movement: Present. Denies leaking of fluid.   The following portions of the patient's history were reviewed and updated as appropriate: allergies, current medications, past family history, past medical history, past social history, past surgical history and problem list.   Objective:   Vitals:   08/10/20 1334  BP: 111/76  Pulse: 88  Weight: 196 lb (88.9 kg)    Fetal Status: Fetal Heart Rate (bpm): 160/160   Movement: Present     General:  Alert, oriented and cooperative. Patient is in no acute distress.  Skin: Skin is warm and dry. No rash noted.   Cardiovascular: Normal heart rate noted  Respiratory: Normal respiratory effort, no problems with respiration noted  Abdomen: Soft, gravid, appropriate for gestational age.  Pain/Pressure: Absent     Pelvic: Cervical exam deferred        Extremities: Normal range of motion.     Mental Status: Normal mood and affect. Normal behavior. Normal judgment and thought content.   Assessment and Plan:  Pregnancy: [id number] at [redacted]w[redacted]d 1. [redacted] weeks gestation of pregnancy   2. Dichorionic diamniotic twin pregnancy, antepartum --FHT x 2 by doppler, confirmed by bedside  US  3. Supervision of high risk pregnancy in first trimester --Anticipatory guidance about next visits/weeks of pregnancy given. --Next visit in 4 weeks,  virtual --Anatomy US ordered  4. Pre-existing diabetes mellitus affecting pregnancy, antepartum --Pt does not yet have meter due to cost. She has [employer], needs to call insurance and ask what meter is covered, let us know.  Preterm labor symptoms and general obstetric precautions including but not limited to vaginal bleeding, contractions, leaking of fluid and fetal movement were reviewed in detail with the patient. Please refer to After Visit Summary for other counseling recommendations.   No follow-ups on file.  Future Appointments  Date Time Provider [place]  09/05/2020 12:30 PM [facility] NURSE [facility]  09/05/2020 12:45 PM [facility] US[facility]  09/07/2020  3:40 PM [place], Gildardo Cranker, MD [place] None    Fatima Blank, CNM

## 2020-08-10 NOTE — Progress Notes (Signed)
Pt states she has not been able to get diabetic testing supplies due to cost, advised to f/u with insurance to verify coverage and make office aware.   Discuss nausea/ heartburn and Rx with pt.  Diclegis not covered, has not yet picked up phenergan.  Pt states pantoprazole works most of time but wants to know if she can increase dose.

## 2020-08-17 ENCOUNTER — Telehealth: Payer: Self-pay

## 2020-08-17 NOTE — Telephone Encounter (Signed)
Called patient to discuss safe medication doing pregnancy. She wanted to make sure benadryl was safe in pregnancy. She is having some allergy symptoms.

## 2020-08-23 ENCOUNTER — Telehealth (INDEPENDENT_AMBULATORY_CARE_PROVIDER_SITE_OTHER): Payer: BC Managed Care – PPO | Admitting: Obstetrics

## 2020-08-23 ENCOUNTER — Encounter: Payer: Self-pay | Admitting: Obstetrics

## 2020-08-23 DIAGNOSIS — Z3A17 17 weeks gestation of pregnancy: Secondary | ICD-10-CM

## 2020-08-23 DIAGNOSIS — O99212 Obesity complicating pregnancy, second trimester: Secondary | ICD-10-CM

## 2020-08-23 DIAGNOSIS — O09299 Supervision of pregnancy with other poor reproductive or obstetric history, unspecified trimester: Secondary | ICD-10-CM

## 2020-08-23 DIAGNOSIS — O30042 Twin pregnancy, dichorionic/diamniotic, second trimester: Secondary | ICD-10-CM

## 2020-08-23 DIAGNOSIS — O99210 Obesity complicating pregnancy, unspecified trimester: Secondary | ICD-10-CM

## 2020-08-23 DIAGNOSIS — O30049 Twin pregnancy, dichorionic/diamniotic, unspecified trimester: Secondary | ICD-10-CM

## 2020-08-23 DIAGNOSIS — O24319 Unspecified pre-existing diabetes mellitus in pregnancy, unspecified trimester: Secondary | ICD-10-CM

## 2020-08-23 DIAGNOSIS — O0992 Supervision of high risk pregnancy, unspecified, second trimester: Secondary | ICD-10-CM

## 2020-08-23 DIAGNOSIS — O09292 Supervision of pregnancy with other poor reproductive or obstetric history, second trimester: Secondary | ICD-10-CM

## 2020-08-23 DIAGNOSIS — J069 Acute upper respiratory infection, unspecified: Secondary | ICD-10-CM

## 2020-08-23 DIAGNOSIS — E669 Obesity, unspecified: Secondary | ICD-10-CM

## 2020-08-23 MED ORDER — AZITHROMYCIN 250 MG PO TABS: ORAL_TABLET | ORAL | 1 refills | Status: DC

## 2020-08-23 NOTE — Progress Notes (Signed)
I connected with Tiffany Hall on 08/23/20 at  1:45 PM EST by telephone and verified that I am speaking with the correct person using two identifiers.  Pt c/o runny nose, coughing clear mucus, and sore throat x 1 week.  Covid and flu test negative on 08/20/20 at [facility]  No relief with Robitussin DM, Benadryl, nor Tylenol

## 2020-08-23 NOTE — Progress Notes (Signed)
   OBSTETRICS PRENATAL VIRTUAL VISIT ENCOUNTER NOTE  Provider location: [facility] [place]   I connected with Tiffany Hall on 08/24/20 at  1:45 PM EST by MyChart Video Encounter at home and verified that I am speaking with the correct person using two identifiers.   I discussed the limitations, risks, security and privacy concerns of performing an evaluation and management service virtually and the availability of in person appointments. I also discussed with the patient that there may be a patient responsible charge related to this service. The patient expressed understanding and agreed to proceed. Subjective:  Tiffany Hall is a 34 y.o. [id number] at [redacted]w[redacted]d being seen today for ongoing prenatal care.  She is currently monitored for the following issues for this high-risk pregnancy and has Pregnancy; Preeclampsia in postpartum period; Supervision of high-risk pregnancy; Twin pregnancy, antepartum condition or complication; Maternal obesity affecting pregnancy, antepartum; History of pre-eclampsia in prior pregnancy, currently pregnant; and Pre-existing diabetes mellitus affecting pregnancy, antepartum on their problem list.  Patient reports cough and congestion.  Contractions: Not present. Vag. Bleeding: None.  Movement: Present. Denies any leaking of fluid.   The following portions of the patient's history were reviewed and updated as appropriate: allergies, current medications, past family history, past medical history, past social history, past surgical history and problem list.   Objective:  There were no vitals filed for this visit.  Fetal Status:     Movement: Present     General:  Alert, oriented and cooperative. Patient is in no acute distress.  Respiratory: Normal respiratory effort, no problems with respiration noted  Mental Status: Normal mood and affect. Normal behavior. Normal judgment and thought content.  Rest of physical exam deferred due to type of  encounter  Imaging: No results found.  Assessment and Plan:  Pregnancy: [id number] at [redacted]w[redacted]d 1. Supervision of high risk pregnancy in second trimester  2. Dichorionic diamniotic twin pregnancy, antepartum  3. Pre-existing diabetes mellitus affecting pregnancy, antepartum  4. History of pre-eclampsia in prior pregnancy, currently pregnant  5. Maternal obesity affecting pregnancy, antepartum  6. URI with cough and congestion - comfort measures recommended of increased fluids, chicken soup, teas, Robitussin, Tylenol and rest Rx: - azithromycin (ZITHROMAX Z-PAK) 250 MG tablet; Take 2 tablets po on day 1, then 1 po tablet daily starting day 2 until finished pack.  Dispense: 6 tablet; Refill: 1   Preterm labor symptoms and general obstetric precautions including but not limited to vaginal bleeding, contractions, leaking of fluid and fetal movement were reviewed in detail with the patient. I discussed the assessment and treatment plan with the patient. The patient was provided an opportunity to ask questions and all were answered. The patient agreed with the plan and demonstrated an understanding of the instructions. The patient was advised to call back or seek an in-person office evaluation/go to MAU at [facility] for any urgent or concerning symptoms. Please refer to After Visit Summary for other counseling recommendations.   I provided 15 minutes of face-to-face time during this encounter.  Return if symptoms worsen or fail to improve.  Future Appointments  Date Time Provider [place]  09/05/2020 12:30 PM [facility] NURSE [facility]  09/05/2020 12:45 PM [facility]  09/07/2020  3:40 PM Goswick, Gildardo Cranker, MD [place] None    Baltazar Najjar, [place] for [facility] 08/24/20

## 2020-08-24 ENCOUNTER — Encounter: Payer: Self-pay | Admitting: Obstetrics

## 2020-08-28 ENCOUNTER — Other Ambulatory Visit: Payer: Self-pay

## 2020-08-28 ENCOUNTER — Inpatient Hospital Stay (HOSPITAL_COMMUNITY)
Admission: AD | Admit: 2020-08-28 | Discharge: 2020-08-28 | Disposition: A | Discharge status: Home / Self Care | Payer: Medicare Other | Attending: Obstetrics & Gynecology | Admitting: Obstetrics & Gynecology

## 2020-08-28 ENCOUNTER — Encounter (HOSPITAL_COMMUNITY): Payer: Self-pay | Admitting: Obstetrics & Gynecology

## 2020-08-28 DIAGNOSIS — R109 Unspecified abdominal pain: Secondary | ICD-10-CM | POA: Diagnosis not present

## 2020-08-28 DIAGNOSIS — O30042 Twin pregnancy, dichorionic/diamniotic, second trimester: Secondary | ICD-10-CM | POA: Diagnosis not present

## 2020-08-28 DIAGNOSIS — R0781 Pleurodynia: Secondary | ICD-10-CM | POA: Diagnosis not present

## 2020-08-28 DIAGNOSIS — R059 Cough, unspecified: Secondary | ICD-10-CM | POA: Insufficient documentation

## 2020-08-28 DIAGNOSIS — Z87891 Personal history of nicotine dependence: Secondary | ICD-10-CM | POA: Insufficient documentation

## 2020-08-28 DIAGNOSIS — M940 Chondrocostal junction syndrome [Tietze]: Secondary | ICD-10-CM | POA: Diagnosis not present

## 2020-08-28 DIAGNOSIS — Z3A18 18 weeks gestation of pregnancy: Secondary | ICD-10-CM | POA: Diagnosis not present

## 2020-08-28 DIAGNOSIS — O26892 Other specified pregnancy related conditions, second trimester: Secondary | ICD-10-CM | POA: Insufficient documentation

## 2020-08-28 LAB — URINALYSIS, ROUTINE W REFLEX MICROSCOPIC
Bilirubin Urine: NEGATIVE
Glucose, UA: NEGATIVE mg/dL
Ketones, ur: 5 mg/dL — AB
Leukocytes,Ua: NEGATIVE
Nitrite: NEGATIVE
Protein, ur: NEGATIVE mg/dL
Specific Gravity, Urine: 1.017 (ref 1.005–1.030)
pH: 6 (ref 5.0–8.0)

## 2020-08-28 LAB — CBC WITH DIFFERENTIAL/PLATELET
Abs Immature Granulocytes: 0.03 10*3/uL (ref 0.00–0.07)
Basophils Absolute: 0 10*3/uL (ref 0.0–0.1)
Basophils Relative: 0 %
Eosinophils Absolute: 0.1 10*3/uL (ref 0.0–0.5)
Eosinophils Relative: 2 %
HCT: 31.5 % — ABNORMAL LOW (ref 36.0–46.0)
Hemoglobin: 10.9 g/dL — ABNORMAL LOW (ref 12.0–15.0)
Immature Granulocytes: 0 %
Lymphocytes Relative: 25 %
Lymphs Abs: 2 10*3/uL (ref 0.7–4.0)
MCH: 26.3 pg (ref 26.0–34.0)
MCHC: 34.6 g/dL (ref 30.0–36.0)
MCV: 75.9 fL — ABNORMAL LOW (ref 80.0–100.0)
Monocytes Absolute: 0.6 10*3/uL (ref 0.1–1.0)
Monocytes Relative: 8 %
Neutro Abs: 5.2 10*3/uL (ref 1.7–7.7)
Neutrophils Relative %: 65 %
Platelets: 347 10*3/uL (ref 150–400)
RBC: 4.15 MIL/uL (ref 3.87–5.11)
RDW: 13 % (ref 11.5–15.5)
WBC: 7.9 10*3/uL (ref 4.0–10.5)
nRBC: 0 % (ref 0.0–0.2)

## 2020-08-28 MED ORDER — KETOROLAC TROMETHAMINE 60 MG/2ML IM SOLN
60.0000 mg | Freq: Once | INTRAMUSCULAR | Status: AC
  Administered 2020-08-28: 60 mg via INTRAMUSCULAR
  Filled 2020-08-28: qty 2

## 2020-08-28 MED ORDER — BENZONATATE 100 MG PO CAPS: 200.0000 mg | ORAL_CAPSULE | Freq: Four times a day (QID) | ORAL | 0 refills | Status: DC | PRN

## 2020-08-28 MED ORDER — GUAIFENESIN-CODEINE 100-10 MG/5ML PO SOLN
5.0000 mL | Freq: Three times a day (TID) | ORAL | 0 refills | Status: DC | PRN
Start: 2020-08-28 — End: 2020-09-09

## 2020-08-28 MED ORDER — IBUPROFEN 600 MG PO TABS: 600.0000 mg | ORAL_TABLET | Freq: Four times a day (QID) | ORAL | 0 refills | Status: DC | PRN

## 2020-08-28 NOTE — Discharge Instructions (Signed)
Costochondritis Costochondritis is swelling and irritation (inflammation) of the tissue (cartilage) that connects your ribs to your breastbone (sternum). This causes pain in the front of your chest. Usually, the pain:  Starts gradually.  Is in more than one rib. This condition usually goes away on its own over time. Follow these instructions at home:  Do not do anything that makes your pain worse.  If directed, put ice on the painful area: ? Put ice in a plastic bag. ? Place a towel between your skin and the bag. ? Leave the ice on for 20 minutes, 2-3 times a day.  If directed, put heat on the affected area as often as told by your doctor. Use the heat source that your doctor tells you to use, such as a moist heat pack or a heating pad. ? Place a towel between your skin and the heat source. ? Leave the heat on for 20-30 minutes. ? Take off the heat if your skin turns bright red. This is very important if you cannot feel pain, heat, or cold. You may have a greater risk of getting burned.  Take over-the-counter and prescription medicines only as told by your doctor.  Return to your normal activities as told by your doctor. Ask your doctor what activities are safe for you.  Keep all follow-up visits as told by your doctor. This is important. Contact a doctor if:  You have chills or a fever.  Your pain does not go away or it gets worse.  You have a cough that does not go away. Get help right away if:  You are short of breath. This information is not intended to replace advice given to you by your health care provider. Make sure you discuss any questions you have with your health care provider. Document Revised: 10/09/2017 Document Reviewed: 01/18/2016 Elsevier Patient Education  2020 [employer] Inc[employer]  

## 2020-08-28 NOTE — MAU Provider Note (Signed)
History     CSN: [id number]  Arrival date and time: 08/28/20 1117   First Provider Initiated Contact with Patient 08/28/20 0734      Chief Complaint  Patient presents with  . Abdominal Pain   HPI   Tiffany Hall is a 34 y.o. female [phone] @ 34w3dwith di/di twins here with right upper rib pain. The pain started yesterday. She was diagnosed with a URI 1 month ago and completed antibiotics. She has continued to have a cough. She reports the cough is worse at night. She is not taking anything for the cough. She was tested for covid which was negative. No fever. She feels like her symptoms are improving, but her cough is still present. She reports while she was sick she had a very harsh, deep cough. No fever.   OB History    Gravida  8   Para  5   Term  5   Preterm  0   AB  1   Living  5     SAB  1   TAB  0   Ectopic  0   Multiple  0   Live Births  3           Past Medical History:  Diagnosis Date  . Anemia   . Anxiety   . Depression   . GERD (gastroesophageal reflux disease)    medication not needed  . Heart murmur   . History of abnormal cervical Pap smear    2009  . History of cardiac murmur as a child   . History of chlamydia    2008  . History of gallstones    2012  . History of gonorrhea    2008  . History of ovarian cyst   . History of trichomonal vaginitis    2008  . Hypertension    PIH with pregnancy  . Migraine headache   . Sickle cell trait ([place]   . Vaginal Pap smear, abnormal    HPV +    Past Surgical History:  Procedure Laterality Date  . COLPOSCOPY    . dilatation and currettage  N/A   . DILATION AND CURETTAGE OF UTERUS  2009   W/  SUCTION --  required blood transfusion  . DILATION AND EVACUATION N/A 09/14/2015   Procedure: DILATATION AND EVACUATION;  Surgeon: MLinda Hedges DO;  [facility];  Service: Gynecology;  Laterality: N/A;    Family History  Problem Relation Age of Onset  . Diabetes Mother   .  Hypertension Mother   . Hepatitis Mother   . Cancer Mother 233      Breast, cervical  . Diabetes Maternal Grandmother   . Hypertension Maternal Grandmother   . Cancer Maternal Grandmother 40       Breast  . Anesthesia problems Neg Hx   . Other Neg Hx     Social History   Tobacco Use  . Smoking status: Former Smoker    Packs/day: 0.00    Types: Cigarettes    Quit date: 11/05/2010    Years since quitting: 9.8  . Smokeless tobacco: Never Used  . Tobacco comment: quit with preg  Vaping Use  . Vaping Use: Never used  Substance Use Topics  . Alcohol use: No    Comment: occ  . Drug use: No    Types: MDMA (Ecstacy)    Comment: prior to this pregnancy in 2009    Allergies:  Allergies  Allergen Reactions  .  Amoxicillin Swelling    Did it involve swelling of the face/tongue/throat, SOB, or low BP? No Did it involve sudden or severe rash/hives, skin peeling, or any reaction on the inside of your mouth or nose? No Did you need to seek medical attention at a hospital or doctor's office? No When did it last happen?last year If all above answers are "NO", may proceed with cephalosporin use.   . Nubain [Nalbuphine Hcl] Swelling, Palpitations and Other (See Comments)    Pt states that she is able to take Percocet without any problems.       Medications Prior to Admission  Medication Sig Dispense Refill Last Dose  . Acetaminophen (TYLENOL EXTRA STRENGTH PO) Take by mouth.   08/27/2020 at 1900  . ferrous sulfate 325 (65 FE) MG tablet Take 1 tablet (325 mg total) by mouth 2 (two) times daily with a meal. 60 tablet 5 Past Month at Unknown time  . folic acid (FOLVITE) 1 MG tablet Take 1 tablet (1 mg total) by mouth daily. 90 tablet 2 Past Month at Unknown time  . Accu-Chek Softclix Lancets lancets Use as instructed 100 each 12   . aspirin EC 81 MG tablet Take 1 tablet (81 mg total) by mouth daily. Take after 12 weeks for prevention of preeclampsia later in pregnancy 300 tablet 2    . azithromycin (ZITHROMAX Z-PAK) 250 MG tablet Take 2 tablets po on day 1, then 1 po tablet daily starting day 2 until finished pack. 6 tablet 1   . Blood Glucose Monitoring Suppl (ACCU-CHEK GUIDE) w/Device KIT 1 Device by Does not apply route 4 (four) times daily. 1 kit 0   . Blood Pressure Monitoring (BLOOD PRESSURE KIT) DEVI 1 kit by Does not apply route once a week. Check Blood Pressure regularly and record readings into the Babys[employer] App.  Large Cuff.  DX O90.0 (Patient not taking: Reported on 07/13/2020) 1 each 0   . Doxylamine-Pyridoxine (DICLEGIS) 10-10 MG TBEC Take 2 tablets by mouth at bedtime. If symptoms persist, add one tablet in the morning and one in the afternoon 100 tablet 5   . escitalopram (LEXAPRO) 10 MG tablet Take 10 mg by mouth daily.     [name] glucose blood (ACCU-CHEK GUIDE) test strip Use to check blood sugars four times a day was instructed 50 each 12   . pantoprazole (PROTONIX) 40 MG tablet Take 1 tablet (40 mg total) by mouth daily. 30 tablet 1   . Prenatal Vit-Fe Fumarate-FA (MULTIVITAMIN-PRENATAL) 27-0.8 MG TABS tablet Take 1 tablet by mouth daily at 12 noon.   More than a month at Unknown time  . promethazine (PHENERGAN) 25 MG tablet Take 1 tablet (25 mg total) by mouth every 6 (six) hours as needed for nausea or vomiting. (Patient not taking: Reported on 08/23/2020) 30 tablet 2    Results for orders placed or performed during the hospital encounter of 08/28/20 (from the past 48 hour(s))  Urinalysis, Routine w reflex microscopic Urine, Clean Catch     Status: Abnormal   Collection Time: 08/28/20  6:35 AM  Result Value Ref Range   Color, Urine YELLOW YELLOW   APPearance HAZY (A) CLEAR   Specific Gravity, Urine 1.017 1.005 - 1.030   pH 6.0 5.0 - 8.0   Glucose, UA NEGATIVE NEGATIVE mg/dL   Hgb urine dipstick MODERATE (A) NEGATIVE   Bilirubin Urine NEGATIVE NEGATIVE   Ketones, ur 5 (A) NEGATIVE mg/dL   Protein, ur NEGATIVE NEGATIVE mg/dL   Nitrite NEGATIVE  NEGATIVE    Leukocytes,Ua NEGATIVE NEGATIVE   RBC / HPF 0-5 0 - 5 RBC/hpf   WBC, UA 0-5 0 - 5 WBC/hpf   Bacteria, UA RARE (A) NONE SEEN   Squamous Epithelial / LPF 0-5 0 - 5   Mucus PRESENT     Comment: Performed at [facility], [place] [address] [place], [place] [address]  CBC with Differential/Platelet     Status: Abnormal   Collection Time: 08/28/20  7:01 AM  Result Value Ref Range   WBC 7.9 4.0 - 10.5 K/uL   RBC 4.15 3.87 - 5.11 MIL/uL   Hemoglobin 10.9 (L) 12.0 - 15.0 g/dL   HCT 31.5 (L) 36 - 46 %   MCV 75.9 (L) 80.0 - 100.0 fL   MCH 26.3 26.0 - 34.0 pg   MCHC 34.6 30.0 - 36.0 g/dL   RDW 13.0 11.5 - 15.5 %   Platelets 347 150 - 400 K/uL   nRBC 0.0 0.0 - 0.2 %   Neutrophils Relative % 65 %   Neutro Abs 5.2 1.7 - 7.7 K/uL   Lymphocytes Relative 25 %   Lymphs Abs 2.0 0.7 - 4.0 K/uL   Monocytes Relative 8 %   Monocytes Absolute 0.6 0.1 - 1.0 K/uL   Eosinophils Relative 2 %   Eosinophils Absolute 0.1 0.0 - 0.5 K/uL   Basophils Relative 0 %   Basophils Absolute 0.0 0.0 - 0.1 K/uL   Immature Granulocytes 0 %   Abs Immature Granulocytes 0.03 0.00 - 0.07 K/uL    Comment: Performed at [facility] [address] [place], [place] [address]    Review of Systems  Constitutional: Negative for fever.  HENT: Positive for congestion. Negative for sore throat.   Respiratory: Positive for cough. Negative for chest tightness, shortness of breath, wheezing and stridor.    Physical Exam   Blood pressure 125/69, pulse 92, temperature 98.6 F (37 C), temperature source Oral, resp. rate 17, height 5' 4"  (1.626 m), weight 89.5 kg, last menstrual period 04/21/2020, SpO2 98 %.  Physical Exam Constitutional:      General: She is not in acute distress.    Appearance: She is well-developed and normal weight. She is not ill-appearing, toxic-appearing or diaphoretic.  Eyes:     Pupils: Pupils are equal, round, and reactive to light.  Cardiovascular:     Rate and Rhythm: Normal rate.   Pulmonary:     Effort: No respiratory distress.     Breath sounds: Normal breath sounds. No stridor. No wheezing.  Chest:     Chest wall: Tenderness present. No edema.       Comments: + costochondral tenderness; right sided only.  Skin:    General: Skin is warm.  Neurological:     Mental Status: She is alert.    MAU Course  Procedures   Pt informed that the ultrasound is considered a limited OB ultrasound and is not intended to be a complete ultrasound exam.  Patient also informed that the ultrasound is not being completed with the intent of assessing for fetal or placental anomalies or any pelvic abnormalities.  Explained that the purpose of today's ultrasound is to assess for  viability.  Patient acknowledges the purpose of the exam and the limitations of the study.  Fetal heart tones x 2   MDM  Toradol 60 mg given IM  CBC stable.   Assessment and Plan   A:  1. Costochondritis, acute   2. Cough  3. Dichorionic diamniotic twin pregnancy in second trimester   4. [redacted] weeks gestation of pregnancy      P:  Discharge home in stable condition Rx: Ibuprofen for 3 days only, tessalon pearls, codeine cough Continue heat Return to MAU if symptoms worsen  Rameses Ou, Artist Pais, NP 08/29/2020 10:57 AM

## 2020-08-28 NOTE — MAU Note (Signed)
. °  Tiffany Hall is a 34 y.o. at [redacted]w[redacted]d here in MAU reporting: Right upper abdominal pain that started yesterday around 1800. She states that it has progressively gotten worse. Patient has DiDi twins. No VB or LOF.   Pain score: 8 Vitals:   08/28/20 0626  BP: 132/75  Pulse: 95  Resp: 17  Temp: 98.6 F (37 C)  SpO2: 98%     Lab orders placed from triage: UA

## 2020-09-03 ENCOUNTER — Inpatient Hospital Stay (HOSPITAL_COMMUNITY)
Admission: EM | Admit: 2020-09-03 | Discharge: 2020-09-04 | Disposition: A | Discharge status: Home / Self Care | Payer: Medicare Other | Attending: Obstetrics & Gynecology | Admitting: Obstetrics & Gynecology

## 2020-09-03 ENCOUNTER — Encounter (HOSPITAL_COMMUNITY): Payer: Self-pay | Admitting: Emergency Medicine

## 2020-09-03 DIAGNOSIS — O30049 Twin pregnancy, dichorionic/diamniotic, unspecified trimester: Secondary | ICD-10-CM

## 2020-09-03 DIAGNOSIS — Z3A19 19 weeks gestation of pregnancy: Secondary | ICD-10-CM | POA: Insufficient documentation

## 2020-09-03 DIAGNOSIS — O30042 Twin pregnancy, dichorionic/diamniotic, second trimester: Secondary | ICD-10-CM | POA: Insufficient documentation

## 2020-09-03 DIAGNOSIS — O26892 Other specified pregnancy related conditions, second trimester: Secondary | ICD-10-CM | POA: Insufficient documentation

## 2020-09-03 DIAGNOSIS — R1084 Generalized abdominal pain: Secondary | ICD-10-CM | POA: Insufficient documentation

## 2020-09-03 DIAGNOSIS — S3991XA Unspecified injury of abdomen, initial encounter: Secondary | ICD-10-CM
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
## 2020-09-03 NOTE — ED Notes (Signed)
Pt taken to MAU by [facility] RN, Dr. Christy Gentles confirmed acceptance with MAU APP
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
## 2020-09-04 DIAGNOSIS — R1084 Generalized abdominal pain: Secondary | ICD-10-CM

## 2020-09-04 DIAGNOSIS — O30049 Twin pregnancy, dichorionic/diamniotic, unspecified trimester: Secondary | ICD-10-CM

## 2020-09-04 DIAGNOSIS — Z3A19 19 weeks gestation of pregnancy: Secondary | ICD-10-CM

## 2020-09-04 DIAGNOSIS — S3991XA Unspecified injury of abdomen, initial encounter: Secondary | ICD-10-CM

## 2020-09-04 DIAGNOSIS — O26892 Other specified pregnancy related conditions, second trimester: Secondary | ICD-10-CM | POA: Diagnosis not present

## 2020-09-04 MED ORDER — IBUPROFEN 600 MG PO TABS
600.0000 mg | ORAL_TABLET | Freq: Once | ORAL | Status: AC
  Administered 2020-09-04: 600 mg via ORAL
  Filled 2020-09-04: qty 1

## 2020-09-04 NOTE — Discharge Instructions (Signed)
Activity Restriction During Pregnancy Your health care provider may recommend specific activity restrictions during pregnancy for a variety of reasons. Activity restriction may require that you limit activities that require great effort, such as exercise, lifting, or sex. The type of activity restriction will vary for each person, depending on your risk or the problems you are having. Activity restriction may be recommended for a period of time until your baby is delivered. Why are activity restrictions recommended? Activity restriction may be recommended if:  Your placenta is partially or completely covering the opening of your cervix (placenta previa).  There is bleeding between the wall of the uterus and the amniotic sac in the first trimester of pregnancy (subchorionic hemorrhage).  You went into labor too early (preterm labor).  You have a history of miscarriage.  You have a condition that causes high blood pressure during pregnancy (preeclampsia or eclampsia).  You are pregnant with more than one baby.  Your baby is not growing well. What are the risks? The risks depend on your specific restriction. Strict bed rest has the most physical and emotional risks and is no longer routinely recommended. Risks of strict bed rest include:  Loss of muscle conditioning from not moving.  Blood clots.  Social isolation.  Depression.  Loss of income. Talk with your health care team about activity restriction to decide if it is best for you and your baby. Even if you are having problems during your pregnancy, you may be able to continue with normal levels of activity with careful monitoring by your health care team. Follow these instructions at home: If needed, based on your overall health and the health of your baby, your health care provider will decide which type of activity restriction is right for you. Activity restrictions may include:  Not lifting anything heavier than 10 pounds (4.5  kg).  Avoiding activities that take a lot of physical effort.  No lifting or straining.  Resting in a sitting position or lying down for periods of time during the day. Pelvic rest may be recommended along with activity restrictions. If pelvic rest is recommended, then:  Do not have sex, an orgasm, or use sexual stimulation.  Do not use tampons. Do not douche. Do not put anything into your vagina.  Do not lift anything that is heavier than 10 lb (4.5 kg).  Avoid activities that require a lot of effort.  Avoid any activity in which your pelvic muscles could become strained, such as squatting. Questions to ask your health care provider  Why is my activity being limited?  How will activity restrictions affect my body?  Why is rest helpful for me and my baby?  What activities can I do?  When can I return to normal activities? When should I seek immediate medical care? Seek immediate medical care if you have:  Vaginal bleeding.  Vaginal discharge.  Cramping pain in your lower abdomen.  Regular contractions.  A low, dull backache. Summary  Your health care provider may recommend specific activity restrictions during pregnancy for a variety of reasons.  Activity restriction may require that you limit activities such as exercise, lifting, sex, or any other activity that requires great effort.  Discuss the risks and benefits of activity restriction with your health care team to decide if it is best for you and your baby.  Contact your health care provider right away if you think you are having contractions, or if you notice vaginal bleeding, discharge, or cramping. This information is not  intended to replace advice given to you by your health care provider. Make sure you discuss any questions you have with your health care provider. Document Revised: 06/17/2019 Document Reviewed: 01/14/2018 Elsevier Patient Education  [place].
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
## 2020-09-05 ENCOUNTER — Other Ambulatory Visit: Payer: Self-pay

## 2020-09-05 ENCOUNTER — Ambulatory Visit: Payer: Medicare Other | Admitting: *Deleted

## 2020-09-05 ENCOUNTER — Ambulatory Visit: Payer: Medicare Other

## 2020-09-05 ENCOUNTER — Encounter: Payer: Self-pay | Admitting: *Deleted

## 2020-09-05 ENCOUNTER — Ambulatory Visit: Payer: Medicare Other | Attending: Obstetrics and Gynecology

## 2020-09-05 DIAGNOSIS — O09299 Supervision of pregnancy with other poor reproductive or obstetric history, unspecified trimester: Secondary | ICD-10-CM | POA: Insufficient documentation

## 2020-09-05 DIAGNOSIS — O99210 Obesity complicating pregnancy, unspecified trimester: Secondary | ICD-10-CM | POA: Insufficient documentation

## 2020-09-05 DIAGNOSIS — O24319 Unspecified pre-existing diabetes mellitus in pregnancy, unspecified trimester: Secondary | ICD-10-CM | POA: Diagnosis present

## 2020-09-05 DIAGNOSIS — O0991 Supervision of high risk pregnancy, unspecified, first trimester: Secondary | ICD-10-CM | POA: Insufficient documentation

## 2020-09-05 DIAGNOSIS — O0990 Supervision of high risk pregnancy, unspecified, unspecified trimester: Secondary | ICD-10-CM | POA: Insufficient documentation
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
## 2020-09-06 ENCOUNTER — Other Ambulatory Visit: Payer: Self-pay | Admitting: *Deleted

## 2020-09-06 DIAGNOSIS — O30042 Twin pregnancy, dichorionic/diamniotic, second trimester: Secondary | ICD-10-CM

## 2020-09-07 ENCOUNTER — Other Ambulatory Visit: Payer: Self-pay

## 2020-09-07 ENCOUNTER — Ambulatory Visit (INDEPENDENT_AMBULATORY_CARE_PROVIDER_SITE_OTHER): Payer: BC Managed Care – PPO | Admitting: Obstetrics and Gynecology

## 2020-09-07 ENCOUNTER — Encounter: Payer: Self-pay | Admitting: Obstetrics and Gynecology

## 2020-09-07 VITALS — BP 109/67 | HR 88 | Wt 199.0 lb

## 2020-09-07 DIAGNOSIS — O09299 Supervision of pregnancy with other poor reproductive or obstetric history, unspecified trimester: Secondary | ICD-10-CM

## 2020-09-07 DIAGNOSIS — O98311 Other infections with a predominantly sexual mode of transmission complicating pregnancy, first trimester: Secondary | ICD-10-CM

## 2020-09-07 DIAGNOSIS — B977 Papillomavirus as the cause of diseases classified elsewhere: Secondary | ICD-10-CM

## 2020-09-07 DIAGNOSIS — D582 Other hemoglobinopathies: Secondary | ICD-10-CM

## 2020-09-07 DIAGNOSIS — Z3A19 19 weeks gestation of pregnancy: Secondary | ICD-10-CM

## 2020-09-07 DIAGNOSIS — O30049 Twin pregnancy, dichorionic/diamniotic, unspecified trimester: Secondary | ICD-10-CM

## 2020-09-07 DIAGNOSIS — O24319 Unspecified pre-existing diabetes mellitus in pregnancy, unspecified trimester: Secondary | ICD-10-CM

## 2020-09-07 MED ORDER — METFORMIN HCL 500 MG PO TABS: 500.0000 mg | ORAL_TABLET | Freq: Two times a day (BID) | ORAL | 6 refills | Status: DC

## 2020-09-07 NOTE — Patient Instructions (Addendum)
Start metformin 500mg  (1 tablet) twice daily to help control your blood sugars.  It is very important to check your blood sugar level 4 times daily (before breakfast and 1 hour after all meals).  Please keep all ultrasound appointments. You will have an appointment with our diabetes educator in 1-2 weeks. It will be very important to bring a record of your blood sugar levels to this appointment.  Second Trimester of Pregnancy  The second trimester is from week 14 through week 27 (month 4 through 6). This is often the time in pregnancy that you feel your best. Often times, morning sickness has lessened or quit. You may have more energy, and you may get hungry more often. Your unborn baby is growing rapidly. At the end of the sixth month, he or she is about 9 inches long and weighs about 1 pounds. You will likely feel the baby move between 18 and 20 weeks of pregnancy. Follow these instructions at home: Medicines  Take over-the-counter and prescription medicines only as told by your doctor. Some medicines are safe and some medicines are not safe during pregnancy.  Take a prenatal vitamin that contains at least 600 micrograms (mcg) of folic acid.  If you have trouble pooping (constipation), take medicine that will make your stool soft (stool softener) if your doctor approves. Eating and drinking   Eat regular, healthy meals.  Avoid raw meat and uncooked cheese.  If you get low calcium from the food you eat, talk to your doctor about taking a daily calcium supplement.  Avoid foods that are high in fat and sugars, such as fried and sweet foods.  If you feel sick to your stomach (nauseous) or throw up (vomit): ? Eat 4 or 5 small meals a day instead of 3 large meals. ? Try eating a few soda crackers. ? Drink liquids between meals instead of during meals.  To prevent constipation: ? Eat foods that are high in fiber, like fresh fruits and vegetables, whole grains, and beans. ? Drink enough  fluids to keep your pee (urine) clear or pale yellow. Activity  Exercise only as told by your doctor. Stop exercising if you start to have cramps.  Do not exercise if it is too hot, too humid, or if you are in a place of great height (high altitude).  Avoid heavy lifting.  Wear low-heeled shoes. Sit and stand up straight.  You can continue to have sex unless your doctor tells you not to. Relieving pain and discomfort  Wear a good support bra if your breasts are tender.  Take warm water baths (sitz baths) to soothe pain or discomfort caused by hemorrhoids. Use hemorrhoid cream if your doctor approves.  Rest with your legs raised if you have leg cramps or low back pain.  If you develop puffy, bulging veins (varicose veins) in your legs: ? Wear support hose or compression stockings as told by your doctor. ? Raise (elevate) your feet for 15 minutes, 3-4 times a day. ? Limit salt in your food. Prenatal care  Write down your questions. Take them to your prenatal visits.  Keep all your prenatal visits as told by your doctor. This is important. Safety  Wear your seat belt when driving.  Make a list of emergency phone numbers, including numbers for family, friends, the hospital, and police and fire departments. General instructions  Ask your doctor about the right foods to eat or for help finding a counselor, if you need these services.  Ask  your doctor about local prenatal classes. Begin classes before month 6 of your pregnancy.  Do not use hot tubs, steam rooms, or saunas.  Do not douche or use tampons or scented sanitary pads.  Do not cross your legs for long periods of time.  Visit your dentist if you have not done so. Use a soft toothbrush to brush your teeth. Floss gently.  Avoid all smoking, herbs, and alcohol. Avoid drugs that are not approved by your doctor.  Do not use any products that contain nicotine or tobacco, such as cigarettes and e-cigarettes. If you need  help quitting, ask your doctor.  Avoid cat litter boxes and soil used by cats. These carry germs that can cause birth defects in the baby and can cause a loss of your baby (miscarriage) or stillbirth. Contact a doctor if:  You have mild cramps or pressure in your lower belly.  You have pain when you pee (urinate).  You have bad smelling fluid coming from your vagina.  You continue to feel sick to your stomach (nauseous), throw up (vomit), or have watery poop (diarrhea).  You have a nagging pain in your belly area.  You feel dizzy. Get help right away if:  You have a fever.  You are leaking fluid from your vagina.  You have spotting or bleeding from your vagina.  You have severe belly cramping or pain.  You lose or gain weight rapidly.  You have trouble catching your breath and have chest pain.  You notice sudden or extreme puffiness (swelling) of your face, hands, ankles, feet, or legs.  You have not felt the baby move in over an hour.  You have severe headaches that do not go away when you take medicine.  You have trouble seeing. Summary  The second trimester is from week 14 through week 27 (months 4 through 6). This is often the time in pregnancy that you feel your best.  To take care of yourself and your unborn baby, you will need to eat healthy meals, take medicines only if your doctor tells you to do so, and do activities that are safe for you and your baby.  Call your doctor if you get sick or if you notice anything unusual about your pregnancy. Also, call your doctor if you need help with the right food to eat, or if you want to know what activities are safe for you. This information is not intended to replace advice given to you by your health care provider. Make sure you discuss any questions you have with your health care provider. Document Revised: 01/16/2019 Document Reviewed: 10/30/2016 Elsevier Patient Education  [place].

## 2020-09-07 NOTE — Progress Notes (Signed)
PRENATAL VISIT NOTE  Subjective:  Tiffany Hall is a 34 y.o. [id number] at 11w6dbeing seen today for ongoing prenatal care.  She is currently monitored for the following issues for this high-risk pregnancy and has Supervision of high-risk pregnancy; Twin pregnancy, antepartum condition or complication; Maternal obesity affecting pregnancy, antepartum; History of pre-eclampsia in prior pregnancy, currently pregnant; Pre-existing diabetes mellitus affecting pregnancy, antepartum; and [redacted] weeks gestation of pregnancy on their problem list.  Patient reports no complaints.  Contractions: Not present. Vag. Bleeding: None.  Movement: Present. Denies leaking of fluid.   The following portions of the patient's history were reviewed and updated as appropriate: allergies, current medications, past family history, past medical history, past social history, past surgical history and problem list.   Objective:   Vitals:   09/07/20 1552  BP: 109/67  Pulse: 88  Weight: 199 lb (90.3 kg)    Fetal Status: Fetal Heart Rate (bpm): 160/165 (Simultaneous filing. User may not have seen previous data.)   Movement: Present     General:  Alert, oriented and cooperative. Patient is in no acute distress.  Skin: Skin is warm and dry. No rash noted.   Cardiovascular: Normal heart rate noted  Respiratory: Normal respiratory effort, no problems with respiration noted  Abdomen: Soft, gravid, appropriate for gestational age.  Pain/Pressure: Present     Pelvic: Cervical exam deferred        Extremities: Normal range of motion.     Mental Status: Normal mood and affect. Normal behavior. Normal judgment and thought content.   Assessment and Plan:  Pregnancy: [id number] 121w6d1. [redacted] weeks gestation of pregnancy, Supervision of high risk pregnancy: No red flag signs/symptoms today. +FHTs and recent anatomy scan within normal limits. Pt s/p COVID vaccine x2. Declined flu vaccine today despite counseling. - continue ASA  + prenatal vitamin daily - rtc in 2 weeks for f/u given uncontrolled pre-gestational diabetes without recent monitoring as noted below (ensure fetal echo scheduled & f/u BG log)  2. Pre-existing diabetes mellitus affecting pregnancy, antepartum: Initial A1c 8.0% on 07/13/20 at 1169w6dt reports that she was diagnosed with T2DM just prior to positive pregnancy test. No prior medications for diabetes. She met with diabetes educator on 10/6 at which time she was instructed on appropriate diet changes. She started checking home BG levels and reports readings of FBG ~200 and postprandial BG ~280 in early pregnancy. She now reports that she was unable to get refills of her test strips and lancets (due to issues with insurance) and so has not checked her BG levels in weeks. Given lack of data and initial BG levels far above goal, discussed plan to start metformin today. - emphasized importance of good BG control in and outside of pregnancy - initiate metformin 500m56mD today (with plan to up-titrate to 1g BID or consider adding insulin pending home BG readings) - [facility] - continue ASA daily + prenatal vitamin - plan to start weekly antenatal testing at 32w given need for medication - rtc in 2 weeks for f/u prenatal given need for close f/u with diabetes plan; also referral to DM educator - needs eye exam (plan to place referral at f/u appt)  3. Dichorionic diamniotic twin pregnancy, antepartum: Spontaneously conceived. Pt with recent anatomy scan at 19w320w3ding normal anatomy (Fetus A: EFW 27% & Fetus B: EFW 43%). - plan for repeat growth ultrasound in 4 weeks as previously scheduled - weekly  antenatal testing as noted above - plan for IOL by 38 weeks given twin pregnancy (or sooner per MFM)  4. History of pre-eclampsia in prior pregnancy, currently pregnant: BP wnl today. No concerning signs/symptoms. - encouraged continued exercise and healthy diet -  counseled on signs/symptoms of preeclampsia & increased risk with twin pregnancy - continue ASA 35m daily  5. 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 - plan for repeat co-testing in 07/2021  6. Hemoglobin C Trait  Preterm labor symptoms and general obstetric precautions including but not limited to vaginal bleeding, contractions, leaking of fluid and fetal movement were reviewed in detail with the patient. Please refer to After Visit Summary for other counseling recommendations.   Return in about 1 week (around 09/14/2020) for f/u with diabetes educator ASAP, f/u prenatal appt in 2-3 weeks in person with MD.  Future Appointments  Date Time Provider [place] 09/21/2020  3:45 PM A[place]roe Mode [place]  10/03/2020  2:30 PM [facility] 10/03/2020  2:45 PM [facility] US4 [facility]   GRanda Ngo MD OB Fellow, Faculty Practice 09/09/2020 10:03 AM

## 2020-09-07 NOTE — Progress Notes (Signed)
Pt is having some pelvic pressure.  Pt had u/s this week and scheduled for follow up.

## 2020-09-09 DIAGNOSIS — D582 Other hemoglobinopathies: Secondary | ICD-10-CM | POA: Insufficient documentation

## 2020-09-09 DIAGNOSIS — O98311 Other infections with a predominantly sexual mode of transmission complicating pregnancy, first trimester: Secondary | ICD-10-CM | POA: Insufficient documentation

## 2020-09-09 DIAGNOSIS — B977 Papillomavirus as the cause of diseases classified elsewhere: Secondary | ICD-10-CM | POA: Insufficient documentation

## 2020-09-12 ENCOUNTER — Telehealth: Payer: Self-pay | Admitting: Genetic Counselor

## 2020-09-12 ENCOUNTER — Other Ambulatory Visit: Payer: Self-pay

## 2020-09-12 NOTE — Telephone Encounter (Signed)
I called Ms. Lehan to discuss her negative noninvasive prenatal screening (NIPS) result. Ms. Grimley had a sample redrawn for [facility] NIPS on 09/05/20 as her first sample contained an insufficient fetal fraction. The new sample contained a fetal fraction of 7.4% and 7.8% for each twin and a low-risk result was returned. These negative results demonstrated an expected representation of chromosome 21, 18, and 13, greatly reducing the likelihood of trisomies 21, 13, or 18 for both fetuses. Based on these results, it appears that the twins are dizygotic AKA fraternal.  NIPS analyzes placental (fetal) DNA in maternal circulation. NIPS is considered to be highly specific and sensitive, but is not considered to be a diagnostic test. [facility]orama NIPS also does not test for all genetic conditions. Diagnostic testing via amniocentesis is available should Ms. Galentine be interested in confirming this result. She confirmed that she had no questions about these results at this time.  Buelah Manis, MS, [facility] Genetic Counselor

## 2020-09-21 ENCOUNTER — Ambulatory Visit (INDEPENDENT_AMBULATORY_CARE_PROVIDER_SITE_OTHER): Payer: BC Managed Care – PPO | Admitting: Obstetrics & Gynecology

## 2020-09-21 ENCOUNTER — Other Ambulatory Visit: Payer: Self-pay

## 2020-09-21 VITALS — BP 105/71 | HR 94 | Wt 203.0 lb

## 2020-09-21 DIAGNOSIS — O30049 Twin pregnancy, dichorionic/diamniotic, unspecified trimester: Secondary | ICD-10-CM

## 2020-09-21 DIAGNOSIS — O24319 Unspecified pre-existing diabetes mellitus in pregnancy, unspecified trimester: Secondary | ICD-10-CM

## 2020-09-21 DIAGNOSIS — O0992 Supervision of high risk pregnancy, unspecified, second trimester: Secondary | ICD-10-CM

## 2020-09-21 NOTE — Patient Instructions (Signed)

## 2020-09-21 NOTE — Progress Notes (Signed)
Pt states she is having cramping and ctx x 1 week.   Pt has not yet started on Metformin.

## 2020-09-21 NOTE — Progress Notes (Signed)
.     PRENATAL VISIT NOTE  Subjective:  Tiffany Hall is a 34 y.o. [id number] at [redacted]w[redacted]d being seen today for ongoing prenatal care.  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  Patient reports occasional contractions.  Contractions: Irritability. Vag. Bleeding: None.  Movement: Present. Denies leaking of fluid.   The following portions of the patient's history were reviewed and updated as appropriate: allergies, current medications, past family history, past medical history, past social history, past surgical history and problem list.   Objective:   Vitals:   09/21/20 1612  BP: 105/71  Pulse: 94  Weight: 203 lb (92.1 kg)    Fetal Status: Fetal Heart Rate (bpm): 145/155   Movement: Present  Presentation: Undeterminable  General:  Alert, oriented and cooperative. Patient is in no acute distress.  Skin: Skin is warm and dry. No rash noted.   Cardiovascular: Normal heart rate noted  Respiratory: Normal respiratory effort, no problems with respiration noted  Abdomen: Soft, gravid, appropriate for gestational age.  Pain/Pressure: Present     Pelvic: Cervical exam performed in the presence of a chaperone Dilation: Closed Effacement (%): 20    Extremities: Normal range of motion.     Mental Status: Normal mood and affect. Normal behavior. Normal judgment and thought content.   Assessment and Plan:  Pregnancy: [id number] at [redacted]w[redacted]d 1. Supervision of high risk pregnancy in second trimester Cervical exam is low risk  2. Pre-existing diabetes mellitus affecting  pregnancy, antepartum FBS up to 103 and PP up to 160's, needs to start metformin  3. Dichorionic diamniotic twin pregnancy, antepartum F/u US for growth  Preterm labor symptoms and general obstetric precautions including but not limited to vaginal bleeding, contractions, leaking of fluid and fetal movement were reviewed in detail with the patient. Please refer to After Visit Summary for other counseling recommendations.   Return in about 2 weeks (around 10/05/2020).  Future Appointments  Date Time Provider [place]  10/03/2020  2:30 PM [facility] NURSE [facility]  10/03/2020  2:45 PM [facility] US4 [facility]  10/10/2020  3:30 PM Constant, Vickii Chafe, MD [facility] None    Emeterio Reeve, MD

## 2020-09-22 ENCOUNTER — Other Ambulatory Visit: Payer: BC Managed Care – PPO

## 2020-09-24 LAB — AFP, SERUM, OPEN SPINA BIFIDA
AFP MoM: 2.01
AFP Value: 131.3 ng/mL
Gest. Age on Collection Date: 21.7 weeks
Maternal Age At EDD: 35.3 yr
OSBR Risk 1 IN: 3295
Test Results:: NEGATIVE
Weight: 203 [lb_av]

## 2020-09-29 ENCOUNTER — Other Ambulatory Visit: Payer: Self-pay

## 2020-09-29 ENCOUNTER — Encounter (HOSPITAL_COMMUNITY): Payer: Self-pay | Admitting: Obstetrics & Gynecology

## 2020-09-29 ENCOUNTER — Inpatient Hospital Stay (HOSPITAL_COMMUNITY)
Admission: AD | Admit: 2020-09-29 | Discharge: 2020-09-30 | Disposition: A | Discharge status: Home / Self Care | Payer: Medicare Other | Attending: Obstetrics & Gynecology | Admitting: Obstetrics & Gynecology

## 2020-09-29 DIAGNOSIS — O4702 False labor before 37 completed weeks of gestation, second trimester: Secondary | ICD-10-CM

## 2020-09-29 DIAGNOSIS — O24112 Pre-existing diabetes mellitus, type 2, in pregnancy, second trimester: Secondary | ICD-10-CM | POA: Insufficient documentation

## 2020-09-29 DIAGNOSIS — O30042 Twin pregnancy, dichorionic/diamniotic, second trimester: Secondary | ICD-10-CM

## 2020-09-29 DIAGNOSIS — O09522 Supervision of elderly multigravida, second trimester: Secondary | ICD-10-CM | POA: Insufficient documentation

## 2020-09-29 DIAGNOSIS — Z7984 Long term (current) use of oral hypoglycemic drugs: Secondary | ICD-10-CM | POA: Insufficient documentation

## 2020-09-29 DIAGNOSIS — Z3A23 23 weeks gestation of pregnancy: Secondary | ICD-10-CM

## 2020-09-29 DIAGNOSIS — Z87891 Personal history of nicotine dependence: Secondary | ICD-10-CM | POA: Insufficient documentation

## 2020-09-29 DIAGNOSIS — Z79899 Other long term (current) drug therapy: Secondary | ICD-10-CM | POA: Insufficient documentation

## 2020-09-29 NOTE — Telephone Encounter (Signed)
Ariel.  Please get this patient an emergency dental appointment.

## 2020-09-29 NOTE — MAU Note (Signed)
Pt reports tightening in her stomach, cramps and pressure every 2-3 minutes.   Denies vaginal bleeding or LOF.   Reports +FM

## 2020-09-30 DIAGNOSIS — Z87891 Personal history of nicotine dependence: Secondary | ICD-10-CM | POA: Diagnosis not present

## 2020-09-30 DIAGNOSIS — Z7984 Long term (current) use of oral hypoglycemic drugs: Secondary | ICD-10-CM | POA: Diagnosis not present

## 2020-09-30 DIAGNOSIS — Z3A23 23 weeks gestation of pregnancy: Secondary | ICD-10-CM

## 2020-09-30 DIAGNOSIS — O30042 Twin pregnancy, dichorionic/diamniotic, second trimester: Secondary | ICD-10-CM | POA: Diagnosis not present

## 2020-09-30 DIAGNOSIS — O4702 False labor before 37 completed weeks of gestation, second trimester: Secondary | ICD-10-CM

## 2020-09-30 DIAGNOSIS — Z79899 Other long term (current) drug therapy: Secondary | ICD-10-CM | POA: Diagnosis not present

## 2020-09-30 DIAGNOSIS — O24112 Pre-existing diabetes mellitus, type 2, in pregnancy, second trimester: Secondary | ICD-10-CM | POA: Diagnosis not present

## 2020-09-30 DIAGNOSIS — O09522 Supervision of elderly multigravida, second trimester: Secondary | ICD-10-CM | POA: Diagnosis not present

## 2020-09-30 LAB — WET PREP, GENITAL
Sperm: NONE SEEN
Trich, Wet Prep: NONE SEEN
Yeast Wet Prep HPF POC: NONE SEEN

## 2020-09-30 LAB — URINALYSIS, ROUTINE W REFLEX MICROSCOPIC
Bilirubin Urine: NEGATIVE
Glucose, UA: NEGATIVE mg/dL
Ketones, ur: 5 mg/dL — AB
Leukocytes,Ua: NEGATIVE
Nitrite: NEGATIVE
Protein, ur: NEGATIVE mg/dL
Specific Gravity, Urine: 1.015 (ref 1.005–1.030)
pH: 6 (ref 5.0–8.0)

## 2020-09-30 LAB — FETAL FIBRONECTIN: Fetal Fibronectin: NEGATIVE

## 2020-09-30 MED ORDER — LACTATED RINGERS IV BOLUS
1000.0000 mL | Freq: Once | INTRAVENOUS | Status: AC
  Administered 2020-09-30: 01:00:00 1000 mL via INTRAVENOUS

## 2020-09-30 MED ORDER — LACTATED RINGERS IV BOLUS
1000.0000 mL | Freq: Once | INTRAVENOUS | Status: AC
  Administered 2020-09-30: 03:00:00 1000 mL via INTRAVENOUS

## 2020-09-30 MED ORDER — NIFEDIPINE 10 MG PO CAPS
10.0000 mg | ORAL_CAPSULE | ORAL | Status: DC | PRN
  Administered 2020-09-30 (×3): 10 mg via ORAL
  Filled 2020-09-30 (×3): qty 1

## 2020-09-30 NOTE — MAU Provider Note (Signed)
History     CSN: [id number]  Arrival date and time: 09/29/20 2315   Event Date/Time   First Provider Initiated Contact with Patient 09/30/20 0018      Chief Complaint  Patient presents with  . Abdominal Pain   Tiffany Hall is a 34 y.o. G8P5 at 84w1dwith DiDi twins who presents to MAU with complaints of contractions. Patient reports contractions started occurring 2 hours ago and are every 2-3 minutes. She reports contractions (tightening), cramping and pressure. Patient reports oral hydration at home and rest to decrease contractions but denies it helping. She denies having to breath through contractions but them being noticeable. She denies vaginal bleeding or discharge. She denies recent IC. Pregnancy is complicated by DM. Patient last took CBG 2 hours ago and was 104. +FM x2   OB History    Gravida  8   Para  5   Term  5   Preterm  0   AB  2   Living  5     SAB  2   IAB  0   Ectopic  0   Multiple  0   Live Births  3           Past Medical History:  Diagnosis Date  . Anemia   . Anxiety   . Depression   . GERD (gastroesophageal reflux disease)    medication not needed  . Heart murmur   . History of abnormal cervical Pap smear    2009  . History of cardiac murmur as a child   . History of chlamydia    2008  . History of gallstones    2012  . History of gonorrhea    2008  . History of ovarian cyst   . History of trichomonal vaginitis    2008  . Hypertension    PIH with pregnancy  . Migraine headache   . Preeclampsia in postpartum period 01/15/2015  . Sickle cell trait ([place]   . Vaginal Pap smear, abnormal    HPV +    Past Surgical History:  Procedure Laterality Date  . COLPOSCOPY    . dilatation and currettage  N/A   . DILATION AND CURETTAGE OF UTERUS  2009   W/  SUCTION --  required blood transfusion  . DILATION AND EVACUATION N/A 09/14/2015   Procedure: DILATATION AND EVACUATION;  Surgeon: MLinda Hedges DO;  [facility];   Service: Gynecology;  Laterality: N/A;    Family History  Problem Relation Age of Onset  . Diabetes Mother   . Hypertension Mother   . Hepatitis Mother   . Cancer Mother 216      Breast, cervical  . Diabetes Maternal Grandmother   . Hypertension Maternal Grandmother   . Cancer Maternal Grandmother 40       Breast  . Anesthesia problems Neg Hx   . Other Neg Hx     Social History   Tobacco Use  . Smoking status: Former Smoker    Packs/day: 0.00    Types: Cigarettes    Quit date: 11/05/2010    Years since quitting: 9.9  . Smokeless tobacco: Never Used  . Tobacco comment: quit with preg  Vaping Use  . Vaping Use: Never used  Substance Use Topics  . Alcohol use: No    Comment: occ  . Drug use: No    Types: MDMA (Ecstacy)    Comment: prior to this pregnancy in 2009    Allergies:  Allergies  Allergen Reactions  . Amoxicillin Swelling    Did it involve swelling of the face/tongue/throat, SOB, or low BP? No Did it involve sudden or severe rash/hives, skin peeling, or any reaction on the inside of your mouth or nose? No Did you need to seek medical attention at a hospital or doctor's office? No When did it last happen?last year If all above answers are "NO", may proceed with cephalosporin use.   . Nubain [Nalbuphine Hcl] Swelling, Palpitations and Other (See Comments)    Pt states that she is able to take Percocet without any problems.       Medications Prior to Admission  Medication Sig Dispense Refill Last Dose  . Accu-Chek Softclix Lancets lancets Use as instructed 100 each 12 09/28/2020 at Unknown time  . Acetaminophen (TYLENOL EXTRA STRENGTH PO) Take by mouth.   09/28/2020 at Unknown time  . aspirin EC 81 MG tablet Take 1 tablet (81 mg total) by mouth daily. Take after 12 weeks for prevention of preeclampsia later in pregnancy 300 tablet 2 09/28/2020 at Unknown time  . docusate sodium (COLACE) 100 MG capsule Take 100 mg by mouth 2 (two) times daily.    09/28/2020 at Unknown time  . ferrous sulfate 325 (65 FE) MG tablet Take 1 tablet (325 mg total) by mouth 2 (two) times daily with a meal. 60 tablet 5 09/28/2020 at Unknown time  . metFORMIN (GLUCOPHAGE) 500 MG tablet Take 1 tablet (500 mg total) by mouth 2 (two) times daily with a meal. 120 tablet 6 09/28/2020 at Unknown time  . Prenatal Vit-Fe Fumarate-FA (MULTIVITAMIN-PRENATAL) 27-0.8 MG TABS tablet Take 1 tablet by mouth daily at 12 noon.   09/28/2020 at Unknown time  . Blood Glucose Monitoring Suppl (ACCU-CHEK GUIDE) w/Device KIT 1 Device by Does not apply route 4 (four) times daily. 1 kit 0 More than a month at Unknown time  . Blood Pressure Monitoring (BLOOD PRESSURE KIT) DEVI 1 kit by Does not apply route once a week. Check Blood Pressure regularly and record readings into the Babyscripts App.  Large Cuff.  DX O90.0 (Patient not taking: Reported on 07/13/2020) 1 each 0   . folic acid (FOLVITE) 1 MG tablet Take 1 tablet (1 mg total) by mouth daily. 90 tablet 2 More than a month at Unknown time  . glucose blood (ACCU-CHEK GUIDE) test strip Use to check blood sugars four times a day was instructed 50 each 12   . pantoprazole (PROTONIX) 40 MG tablet Take 1 tablet (40 mg total) by mouth daily. 30 tablet 1 More than a month at Unknown time    Review of Systems  Constitutional: Negative.   Respiratory: Negative.   Cardiovascular: Negative.   Gastrointestinal: Positive for abdominal pain. Negative for constipation, diarrhea, nausea and vomiting.  Genitourinary: Negative for difficulty urinating, dysuria, frequency, urgency, vaginal bleeding and vaginal discharge.       Pelvic pressure  Musculoskeletal: Negative.   Neurological: Negative.   Psychiatric/Behavioral: Negative.    Physical Exam   Blood pressure 119/68, pulse 88, temperature 98.3 F (36.8 C), resp. rate 20, last menstrual period 04/21/2020, SpO2 97 %.  Physical Exam Vitals and nursing note reviewed.  Cardiovascular:     Rate  and Rhythm: Normal rate and regular rhythm.  Pulmonary:     Effort: Pulmonary effort is normal. No respiratory distress.     Breath sounds: Normal breath sounds. No wheezing.  Abdominal:     Palpations: Abdomen is soft. There is no mass.  Tenderness: There is no abdominal tenderness. There is no guarding.  Musculoskeletal:     Right lower leg: No edema.     Left lower leg: No edema.  Skin:    General: Skin is warm and dry.  Neurological:     Mental Status: She is alert and oriented to person, place, and time.  Psychiatric:        Mood and Affect: Mood normal.        Behavior: Behavior normal.        Thought Content: Thought content normal.    Initial cervical examination _0  Dilation: Fingertip Effacement (%): Thick Cervical Position: Middle  Unable to effective trace babies with external monitor, toco used  Baby A doppler 136, then, 150 at discharge  Baby B doppler 147, then 157 at discharge   Toco - 3-5 minutes/ mild contractions with UI   MAU Course  Procedures  MDM Orders Placed This Encounter  Procedures  . Wet prep, genital  . Urinalysis, Routine w reflex microscopic Urine, Clean Catch  . Fetal fibronectin  . Insert peripheral IV   Meds ordered this encounter  Medications  . lactated ringers bolus 1,000 mL  . NIFEdipine (PROCARDIA) capsule 10 mg   Reassessment @ [phone] - patient reports contractions have spaced to 5-15 minutes but are still occurring. Second liter bolus ordered.   Labs reviewed:  Results for orders placed or performed during the hospital encounter of 09/29/20 (from the past 24 hour(s))  Urinalysis, Routine w reflex microscopic Urine, Clean Catch     Status: Abnormal   Collection Time: 09/29/20 11:54 PM  Result Value Ref Range   Color, Urine YELLOW YELLOW   APPearance HAZY (A) CLEAR   Specific Gravity, Urine 1.015 1.005 - 1.030   pH 6.0 5.0 - 8.0   Glucose, UA NEGATIVE NEGATIVE mg/dL   Hgb urine dipstick SMALL (A) NEGATIVE    Bilirubin Urine NEGATIVE NEGATIVE   Ketones, ur 5 (A) NEGATIVE mg/dL   Protein, ur NEGATIVE NEGATIVE mg/dL   Nitrite NEGATIVE NEGATIVE   Leukocytes,Ua NEGATIVE NEGATIVE   RBC / HPF 0-5 0 - 5 RBC/hpf   WBC, UA 0-5 0 - 5 WBC/hpf   Bacteria, UA RARE (A) NONE SEEN   Squamous Epithelial / LPF 0-5 0 - 5   Mucus PRESENT   Fetal fibronectin     Status: None   Collection Time: 09/30/20 12:29 AM  Result Value Ref Range   Fetal Fibronectin NEGATIVE NEGATIVE  Wet prep, genital     Status: Abnormal   Collection Time: 09/30/20 12:29 AM  Result Value Ref Range   Yeast Wet Prep HPF POC NONE SEEN NONE SEEN   Trich, Wet Prep NONE SEEN NONE SEEN   Clue Cells Wet Prep HPF POC PRESENT (A) NONE SEEN   WBC, Wet Prep HPF POC MANY (A) NONE SEEN   Sperm NONE SEEN    Reassessment _1  - patient reports she is feeling better, contractions have continued to space and she is no longer having pelvic pressure.  Cervix recheck and unchanged at this time. FFN negative.   Discussed reasons to return to MAU. Follow up as scheduled in the office. Return to MAU as needed. PTL precautions reviewed. Pt stable at time of discharge.   Assessment and Plan   1. Preterm uterine contractions in second trimester, antepartum   2. [redacted] weeks gestation of pregnancy   3. Dichorionic diamniotic twin pregnancy in second trimester    Discharge home Follow up as scheduled  in the office for prenatal care PTL precautions  Hydration and rest  Return to MAU as needed for reasons discussed and/or emergencies    Follow-up [place] Follow up.   Specialty: Obstetrics and Gynecology Contact information: [address] [place] [phone]             Allergies as of 09/30/2020      Reactions   Amoxicillin Swelling   Did it involve swelling of the face/tongue/throat, SOB, or low BP? No Did it involve sudden or severe rash/hives, skin  peeling, or any reaction on the inside of your mouth or nose? No Did you need to seek medical attention at a hospital or doctor's office? No When did it last happen?last year If all above answers are "NO", may proceed with cephalosporin use.   Nubain [nalbuphine Hcl] Swelling, Palpitations, Other (See Comments)   Pt states that she is able to take Percocet without any problems.        Medication List    TAKE these medications   Accu-Chek Guide test strip Generic drug: glucose blood Use to check blood sugars four times a day was instructed   Accu-Chek Guide w/Device Kit 1 Device by Does not apply route 4 (four) times daily.   Accu-Chek Softclix Lancets lancets Use as instructed   aspirin EC 81 MG tablet Take 1 tablet (81 mg total) by mouth daily. Take after 12 weeks for prevention of preeclampsia later in pregnancy   Blood Pressure Kit Devi 1 kit by Does not apply route once a week. Check Blood Pressure regularly and record readings into the Babyscripts App.  Large Cuff.  DX O90.0   docusate sodium 100 MG capsule Commonly known as: COLACE Take 100 mg by mouth 2 (two) times daily.   ferrous sulfate 325 (65 FE) MG tablet Take 1 tablet (325 mg total) by mouth 2 (two) times daily with a meal.   folic acid 1 MG tablet Commonly known as: FOLVITE Take 1 tablet (1 mg total) by mouth daily.   metFORMIN 500 MG tablet Commonly known as: Glucophage Take 1 tablet (500 mg total) by mouth 2 (two) times daily with a meal.   multivitamin-prenatal 27-0.8 MG Tabs tablet Take 1 tablet by mouth daily at 12 noon.   pantoprazole 40 MG tablet Commonly known as: Protonix Take 1 tablet (40 mg total) by mouth daily.   TYLENOL EXTRA STRENGTH PO Take by mouth.       Lajean Manes CNM 09/30/2020, 4:02 AM

## 2020-10-03 ENCOUNTER — Ambulatory Visit: Payer: Medicare Other | Admitting: *Deleted

## 2020-10-03 ENCOUNTER — Other Ambulatory Visit: Payer: Self-pay

## 2020-10-03 ENCOUNTER — Ambulatory Visit: Payer: Medicare Other | Attending: Obstetrics and Gynecology

## 2020-10-03 ENCOUNTER — Other Ambulatory Visit: Payer: Self-pay | Admitting: *Deleted

## 2020-10-03 ENCOUNTER — Encounter: Payer: Self-pay | Admitting: *Deleted

## 2020-10-03 DIAGNOSIS — E669 Obesity, unspecified: Secondary | ICD-10-CM

## 2020-10-03 DIAGNOSIS — O24319 Unspecified pre-existing diabetes mellitus in pregnancy, unspecified trimester: Secondary | ICD-10-CM

## 2020-10-03 DIAGNOSIS — O402XX2 Polyhydramnios, second trimester, fetus 2: Secondary | ICD-10-CM

## 2020-10-03 DIAGNOSIS — Z3A23 23 weeks gestation of pregnancy: Secondary | ICD-10-CM

## 2020-10-03 DIAGNOSIS — O99212 Obesity complicating pregnancy, second trimester: Secondary | ICD-10-CM | POA: Diagnosis not present

## 2020-10-03 DIAGNOSIS — E119 Type 2 diabetes mellitus without complications: Secondary | ICD-10-CM

## 2020-10-03 DIAGNOSIS — O99210 Obesity complicating pregnancy, unspecified trimester: Secondary | ICD-10-CM | POA: Diagnosis present

## 2020-10-03 DIAGNOSIS — Z148 Genetic carrier of other disease: Secondary | ICD-10-CM

## 2020-10-03 DIAGNOSIS — O24112 Pre-existing diabetes mellitus, type 2, in pregnancy, second trimester: Secondary | ICD-10-CM

## 2020-10-03 DIAGNOSIS — O30042 Twin pregnancy, dichorionic/diamniotic, second trimester: Secondary | ICD-10-CM | POA: Insufficient documentation

## 2020-10-03 DIAGNOSIS — Z362 Encounter for other antenatal screening follow-up: Secondary | ICD-10-CM

## 2020-10-03 DIAGNOSIS — O358XX2 Maternal care for other (suspected) fetal abnormality and damage, fetus 2: Secondary | ICD-10-CM

## 2020-10-03 DIAGNOSIS — O09299 Supervision of pregnancy with other poor reproductive or obstetric history, unspecified trimester: Secondary | ICD-10-CM

## 2020-10-03 LAB — GC/CHLAMYDIA PROBE AMP (~~LOC~~) NOT AT ARMC
Chlamydia: NEGATIVE
Comment: NEGATIVE
Comment: NORMAL
Neisseria Gonorrhea: NEGATIVE

## 2020-10-06 ENCOUNTER — Encounter (HOSPITAL_COMMUNITY): Payer: Self-pay | Admitting: Family Medicine

## 2020-10-06 ENCOUNTER — Inpatient Hospital Stay (HOSPITAL_BASED_OUTPATIENT_CLINIC_OR_DEPARTMENT_OTHER): Payer: Medicare Other

## 2020-10-06 ENCOUNTER — Inpatient Hospital Stay (HOSPITAL_COMMUNITY)
Admission: AD | Admit: 2020-10-06 | Discharge: 2020-10-06 | Disposition: A | Discharge status: Home / Self Care | Payer: Medicare Other | Attending: Family Medicine | Admitting: Family Medicine

## 2020-10-06 ENCOUNTER — Ambulatory Visit: Admit: 2020-10-06 | Discharge status: Absent without leave | Payer: Medicare Other

## 2020-10-06 ENCOUNTER — Other Ambulatory Visit: Payer: Self-pay

## 2020-10-06 DIAGNOSIS — M5431 Sciatica, right side: Secondary | ICD-10-CM | POA: Diagnosis present

## 2020-10-06 DIAGNOSIS — Z3A24 24 weeks gestation of pregnancy: Secondary | ICD-10-CM | POA: Diagnosis not present

## 2020-10-06 DIAGNOSIS — M79609 Pain in unspecified limb: Secondary | ICD-10-CM | POA: Diagnosis not present

## 2020-10-06 DIAGNOSIS — O99891 Other specified diseases and conditions complicating pregnancy: Secondary | ICD-10-CM | POA: Diagnosis not present

## 2020-10-06 DIAGNOSIS — O30002 Twin pregnancy, unspecified number of placenta and unspecified number of amniotic sacs, second trimester: Secondary | ICD-10-CM | POA: Diagnosis not present

## 2020-10-06 DIAGNOSIS — Z87891 Personal history of nicotine dependence: Secondary | ICD-10-CM | POA: Insufficient documentation

## 2020-10-06 DIAGNOSIS — Z20822 Contact with and (suspected) exposure to covid-19: Secondary | ICD-10-CM | POA: Diagnosis not present

## 2020-10-06 DIAGNOSIS — O26892 Other specified pregnancy related conditions, second trimester: Secondary | ICD-10-CM | POA: Insufficient documentation

## 2020-10-06 LAB — URINALYSIS, ROUTINE W REFLEX MICROSCOPIC
Bilirubin Urine: NEGATIVE
Glucose, UA: 50 mg/dL — AB
Ketones, ur: NEGATIVE mg/dL
Leukocytes,Ua: NEGATIVE
Nitrite: NEGATIVE
Protein, ur: NEGATIVE mg/dL
Specific Gravity, Urine: 1.016 (ref 1.005–1.030)
pH: 6 (ref 5.0–8.0)

## 2020-10-06 LAB — SARS CORONAVIRUS 2 (TAT 6-24 HRS): SARS Coronavirus 2: NEGATIVE

## 2020-10-06 MED ORDER — ACETAMINOPHEN 500 MG PO TABS
1000.0000 mg | ORAL_TABLET | Freq: Once | ORAL | Status: AC
  Administered 2020-10-06: 15:00:00 1000 mg via ORAL
  Filled 2020-10-06: qty 2

## 2020-10-06 MED ORDER — CYCLOBENZAPRINE HCL 5 MG PO TABS: 5.0000 mg | ORAL_TABLET | Freq: Once | ORAL | Status: DC

## 2020-10-06 NOTE — MAU Note (Signed)
Presents with c/o pain in the right hip and leg x1 week.  States pain radiates down the entire leg.  Reports pain began as tingling, but now feels numb & weak.  Denies pregnancy related symptoms, no VB or LOF.  Reports +FM.

## 2020-10-06 NOTE — MAU Provider Note (Signed)
History     CSN: [id number]  Arrival date and time: 10/06/20 1155   Event Date/Time   First Provider Initiated Contact with Patient 10/06/20 1239      Chief Complaint  Patient presents with  . Hip & Leg pain   HPI Tiffany Hall is a 34 y.o. [id number] at [redacted]w[redacted]d who presents with right leg pain. She states for the last week she has had pain in her hip and calf. She rates the pain a 4/10 and has not taken anything for the pain. She is worried she may have a DVT. She denies any redness or swelling at the site but states it is tender to touch. She denies any abdominal pain, vaginal bleeding or discharge. She reports normal fetal movement of both babies. She has had two COVID vaccines but not the booster.   OB History    Gravida  8   Para  5   Term  5   Preterm  0   AB  2   Living  5     SAB  2   IAB  0   Ectopic  0   Multiple  0   Live Births  3           Past Medical History:  Diagnosis Date  . Anemia   . Anxiety   . Depression   . GERD (gastroesophageal reflux disease)    medication not needed  . Heart murmur   . History of abnormal cervical Pap smear    2009  . History of cardiac murmur as a child   . History of chlamydia    2008  . History of gallstones    2012  . History of gonorrhea    2008  . History of ovarian cyst   . History of trichomonal vaginitis    2008  . Hypertension    PIH with pregnancy  . Migraine headache   . Preeclampsia in postpartum period 01/15/2015  . 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 Vaginal Pap smear, abnormal    HPV +    Past Surgical History:  Procedure Laterality Date  . COLPOSCOPY    . dilatation and currettage  N/A   . DILATION AND CURETTAGE OF UTERUS  2009   W/  SUCTION --  required blood transfusion  . DILATION AND EVACUATION N/A 09/14/2015   Procedure: DILATATION AND EVACUATION;  Surgeon: Linda Hedges, DO;  Location: [facility];  Service: Gynecology;  Laterality: N/A;    Family History  Problem Relation Age of  Onset  . Diabetes Mother   . Hypertension Mother   . Hepatitis Mother   . Cancer Mother 82       Breast, cervical  . Diabetes Maternal Grandmother   . Hypertension Maternal Grandmother   . Cancer Maternal Grandmother 40       Breast  . Anesthesia problems Neg Hx   . Other Neg Hx     Social History   Tobacco Use  . Smoking status: Former Smoker    Packs/day: 0.00    Types: Cigarettes    Quit date: 11/05/2010    Years since quitting: 9.9  . Smokeless tobacco: Never Used  . Tobacco comment: quit with preg  Vaping Use  . Vaping Use: Never used  Substance Use Topics  . Alcohol use: No    Comment: occ  . Drug use: No    Types: MDMA (Ecstacy)    Comment: prior to this pregnancy in 2009  Allergies:  Allergies  Allergen Reactions  . Amoxicillin Swelling    Did it involve swelling of the face/tongue/throat, SOB, or low BP? No Did it involve sudden or severe rash/hives, skin peeling, or any reaction on the inside of your mouth or nose? No Did you need to seek medical attention at a hospital or doctor's office? No When did it last happen?last year If all above answers are "NO", may proceed with cephalosporin use.   . Nubain [Nalbuphine Hcl] Swelling, Palpitations and Other (See Comments)    Pt states that she is able to take Percocet without any problems.       Medications Prior to Admission  Medication Sig Dispense Refill Last Dose  . Accu-Chek Softclix Lancets lancets Use as instructed 100 each 12   . Acetaminophen (TYLENOL EXTRA STRENGTH PO) Take by mouth.     [name] aspirin EC 81 MG tablet Take 1 tablet (81 mg total) by mouth daily. Take after 12 weeks for prevention of preeclampsia later in pregnancy 300 tablet 2   . Blood Glucose Monitoring Suppl (ACCU-CHEK GUIDE) w/Device KIT 1 Device by Does not apply route 4 (four) times daily. 1 kit 0   . Blood Pressure Monitoring (BLOOD PRESSURE KIT) DEVI 1 kit by Does not apply route once a week. Check Blood Pressure regularly  and record readings into the Babyscripts App.  Large Cuff.  DX O90.0 (Patient not taking: Reported on 07/13/2020) 1 each 0   . docusate sodium (COLACE) 100 MG capsule Take 100 mg by mouth 2 (two) times daily.     . ferrous sulfate 325 (65 FE) MG tablet Take 1 tablet (325 mg total) by mouth 2 (two) times daily with a meal. 60 tablet 5   . folic acid (FOLVITE) 1 MG tablet Take 1 tablet (1 mg total) by mouth daily. 90 tablet 2   . glucose blood (ACCU-CHEK GUIDE) test strip Use to check blood sugars four times a day was instructed 50 each 12   . metFORMIN (GLUCOPHAGE) 500 MG tablet Take 1 tablet (500 mg total) by mouth 2 (two) times daily with a meal. 120 tablet 6   . pantoprazole (PROTONIX) 40 MG tablet Take 1 tablet (40 mg total) by mouth daily. 30 tablet 1   . Prenatal Vit-Fe Fumarate-FA (MULTIVITAMIN-PRENATAL) 27-0.8 MG TABS tablet Take 1 tablet by mouth daily at 12 noon.       Review of Systems  Constitutional: Negative.  Negative for fatigue and fever.  HENT: Negative.   Respiratory: Negative.  Negative for shortness of breath.   Cardiovascular: Negative.  Negative for chest pain.  Gastrointestinal: Negative.  Negative for abdominal pain, constipation, diarrhea, nausea and vomiting.  Genitourinary: Negative.  Negative for dysuria, vaginal bleeding and vaginal discharge.  Musculoskeletal:       Right calf pain  Neurological: Negative.  Negative for dizziness and headaches.   Physical Exam   Blood pressure 115/65, pulse (!) 101, temperature 98.6 F (37 C), temperature source Oral, resp. rate 20, height _0  (1.626 m), weight 91.7 kg, last menstrual period 04/21/2020, SpO2 100 %.  Physical Exam Vitals and nursing note reviewed.  Constitutional:      General: She is not in acute distress.    Appearance: She is well-developed and well-nourished.  HENT:     Head: Normocephalic.  Eyes:     Pupils: Pupils are equal, round, and reactive to light.  Cardiovascular:     Rate and Rhythm:  Normal rate and regular rhythm.  Heart sounds: Normal heart sounds.  Pulmonary:     Effort: Pulmonary effort is normal. No respiratory distress.     Breath sounds: Normal breath sounds.  Abdominal:     General: Bowel sounds are normal. There is no distension.     Palpations: Abdomen is soft.     Tenderness: There is no abdominal tenderness.  Musculoskeletal:       Legs:  Skin:    General: Skin is warm and dry.  Neurological:     Mental Status: She is alert and oriented to person, place, and time.  Psychiatric:        Mood and Affect: Mood and affect normal.        Behavior: Behavior normal.        Thought Content: Thought content normal.        Judgment: Judgment normal.    Fetal Tracing:  Baby A  Baseline: 150 Variability: moderate Accels: none Decels: occasional variables  Baby B  Baseline: 140 Variability: moderate Accels: none Decels: none  Toco: none   MAU Course  Procedures Results for orders placed or performed during the hospital encounter of 10/06/20 (from the past 24 hour(s))  Urinalysis, Routine w reflex microscopic Urine, Clean Catch     Status: Abnormal   Collection Time: 10/06/20  1:18 PM  Result Value Ref Range   Color, Urine YELLOW YELLOW   APPearance HAZY (A) CLEAR   Specific Gravity, Urine 1.016 1.005 - 1.030   pH 6.0 5.0 - 8.0   Glucose, UA 50 (A) NEGATIVE mg/dL   Hgb urine dipstick SMALL (A) NEGATIVE   Bilirubin Urine NEGATIVE NEGATIVE   Ketones, ur NEGATIVE NEGATIVE mg/dL   Protein, ur NEGATIVE NEGATIVE mg/dL   Nitrite NEGATIVE NEGATIVE   Leukocytes,Ua NEGATIVE NEGATIVE   RBC / HPF 0-5 0 - 5 RBC/hpf   WBC, UA 0-5 0 - 5 WBC/hpf   Bacteria, UA RARE (A) NONE SEEN   Squamous Epithelial / LPF 0-5 0 - 5   Mucus PRESENT    Hyaline Casts, UA PRESENT    MDM UA Venous duplex- negative for DVT COVID-19 test- pending, will call if positive Tylenol PO NST appropriate for gestational age for both babies  Assessment and Plan   1.  Sciatic pain, right   2. [redacted] weeks gestation of pregnancy    -Discharge home in stable condition -Sciatica exercises reviewed -Patient advised to follow-up with OB as scheduled for prenatal care -Patient may return to MAU as needed or if her condition were to change or worsen   Wende Mott CNM 10/06/2020, 12:39 PM

## 2020-10-06 NOTE — Discharge Instructions (Signed)

## 2020-10-09 ENCOUNTER — Ambulatory Visit (HOSPITAL_COMMUNITY)
Admission: EM | Admit: 2020-10-09 | Discharge: 2020-10-09 | Disposition: A | Discharge status: Home / Self Care | Payer: Medicare Other | Attending: Student | Admitting: Student

## 2020-10-09 ENCOUNTER — Encounter (HOSPITAL_COMMUNITY): Payer: Self-pay | Admitting: Emergency Medicine

## 2020-10-09 ENCOUNTER — Ambulatory Visit (HOSPITAL_COMMUNITY): Admit: 2020-10-09 | Discharge status: Absent without leave | Payer: Medicare Other

## 2020-10-09 ENCOUNTER — Other Ambulatory Visit: Payer: Self-pay

## 2020-10-09 DIAGNOSIS — Z20822 Contact with and (suspected) exposure to covid-19: Secondary | ICD-10-CM | POA: Insufficient documentation

## 2020-10-09 LAB — RESP PANEL BY RT-PCR (FLU A&B, COVID) ARPGX2
Influenza A by PCR: NEGATIVE
Influenza B by PCR: NEGATIVE
SARS Coronavirus 2 by RT PCR: NEGATIVE

## 2020-10-09 NOTE — ED Provider Notes (Signed)
[facility]    CSN: [id number] Arrival date & time: 10/09/20  1130      History   Chief Complaint Chief Complaint  Patient presents with  . URI    HPI Tiffany Hall is a 35 y.o. female Presenting for URI symptoms for 2 days following exposure to covid19. History of heart murmur, GERD, depression, anxiety, migraine headache. cold sx onset 2 days associated w/nasal congestion, body aches. Feeling well otherwise. Fiance tested positive for COVID this week. Pt is 24 weeks preg, denies abd pain/vaginal bleeding.  Denies fevers/chills, n/v/d, shortness of breath, chest pain, cough, facial pain, teeth pain, headaches, sore throat, loss of taste/smell, swollen lymph nodes, ear pain.  Denies chest pain, shortness of breath, confusion, high fevers.  Fully vaccinated for covid-19.  HPI  Past Medical History:  Diagnosis Date  . Anemia   . Anxiety   . Depression   . GERD (gastroesophageal reflux disease)    medication not needed  . Heart murmur   . History of abnormal cervical Pap smear    2009  . History of cardiac murmur as a child   . History of chlamydia    2008  . History of gallstones    2012  . History of gonorrhea    2008  . History of ovarian cyst   . History of trichomonal vaginitis    2008  . Hypertension    PIH with pregnancy  . Migraine headache   . Preeclampsia in postpartum period 01/15/2015  . Sickle cell trait ([place])   . Vaginal Pap smear, abnormal    HPV +    Patient Active Problem List   Diagnosis Date Noted  . 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 Pre-existing diabetes mellitus affecting pregnancy, antepartum 07/15/2020  . Twin pregnancy, antepartum condition or complication 29/93/7169  . Maternal obesity affecting pregnancy, antepartum 07/13/2020  . History of pre-eclampsia in prior pregnancy, currently pregnant 07/13/2020  . Supervision of  high-risk pregnancy 07/05/2020    Past Surgical History:  Procedure Laterality Date  . COLPOSCOPY    . dilatation and currettage  N/A   . DILATION AND CURETTAGE OF UTERUS  2009   W/  SUCTION --  required blood transfusion  . DILATION AND EVACUATION N/A 09/14/2015   Procedure: DILATATION AND EVACUATION;  Surgeon: Linda Hedges, DO;  Location: [facility];  Service: Gynecology;  Laterality: N/A;    OB History    Gravida  8   Para  5   Term  5   Preterm  0   AB  2   Living  5     SAB  2   IAB  0   Ectopic  0   Multiple  0   Live Births  3            Home Medications    Prior to Admission medications   Medication Sig Start Date End Date Taking? Authorizing Provider  Accu-Chek Softclix Lancets lancets Use as instructed 08/04/20   Shelly Bombard, MD  Acetaminophen (TYLENOL EXTRA STRENGTH PO) Take by mouth.    [provider]  aspirin EC 81 MG tablet Take 1 tablet (81 mg total) by mouth daily. Take after 12 weeks for prevention of preeclampsia later in pregnancy 07/13/20   Constant, Peggy, MD  Blood Glucose Monitoring Suppl (ACCU-CHEK GUIDE) w/Device KIT 1 Device by Does not apply route 4 (four) times  daily. 08/04/20   Shelly Bombard, MD  Blood Pressure Monitoring (BLOOD PRESSURE KIT) DEVI 1 kit by Does not apply route once a week. Check Blood Pressure regularly and record readings into the Babyscripts App.  Large Cuff.  DX O90.0 Patient not taking: Reported on 07/13/2020 07/06/20   Constant, Peggy, MD  docusate sodium (COLACE) 100 MG capsule Take 100 mg by mouth 2 (two) times daily.    [provider]  ferrous sulfate 325 (65 FE) MG tablet Take 1 tablet (325 mg total) by mouth 2 (two) times daily with a meal. 06/14/20   Shelly Bombard, MD  folic acid (FOLVITE) 1 MG tablet Take 1 tablet (1 mg total) by mouth daily. 06/14/20   Shelly Bombard, MD  glucose blood (ACCU-CHEK GUIDE) test strip Use to check blood sugars four times a day was instructed  08/04/20   Shelly Bombard, MD  metFORMIN (GLUCOPHAGE) 500 MG tablet Take 1 tablet (500 mg total) by mouth 2 (two) times daily with a meal. 09/07/20   Randa Ngo, MD  pantoprazole (PROTONIX) 40 MG tablet Take 1 tablet (40 mg total) by mouth daily. 06/14/20 06/14/21  Constant, Peggy, MD  Prenatal Vit-Fe Fumarate-FA (MULTIVITAMIN-PRENATAL) 27-0.8 MG TABS tablet Take 1 tablet by mouth daily at 12 noon.    [provider]    Family History Family History  Problem Relation Age of Onset  . Diabetes Mother   . Hypertension Mother   . Hepatitis Mother   . Cancer Mother 110       Breast, cervical  . Diabetes Maternal Grandmother   . Hypertension Maternal Grandmother   . Cancer Maternal Grandmother 40       Breast  . Anesthesia problems Neg Hx   . Other Neg Hx     Social History Social History   Tobacco Use  . Smoking status: Former Smoker    Packs/day: 0.00    Types: Cigarettes    Quit date: 11/05/2010    Years since quitting: 9.9  . Smokeless tobacco: Never Used  . Tobacco comment: quit with preg  Vaping Use  . Vaping Use: Never used  Substance Use Topics  . Alcohol use: No    Comment: occ  . Drug use: No    Types: MDMA (Ecstacy)    Comment: prior to this pregnancy in 2009     Allergies   Amoxicillin and Nubain [nalbuphine hcl]   Review of Systems Review of Systems  Constitutional: Negative for appetite change, chills and fever.  HENT: Positive for congestion. Negative for ear pain, rhinorrhea, sinus pressure, sinus pain and sore throat.   Eyes: Negative for redness and visual disturbance.  Respiratory: Negative for cough, chest tightness, shortness of breath and wheezing.   Cardiovascular: Negative for chest pain and palpitations.  Gastrointestinal: Negative for abdominal pain, constipation, diarrhea, nausea and vomiting.  Genitourinary: Negative for dysuria, frequency and urgency.  Musculoskeletal: Positive for myalgias.  Neurological: Negative for  dizziness, weakness and headaches.  Psychiatric/Behavioral: Negative for confusion.  All other systems reviewed and are negative.    Physical Exam Triage Vital Signs ED Triage Vitals  Enc Vitals Group     BP 10/09/20 1328 112/64     Pulse Rate 10/09/20 1328 88     Resp 10/09/20 1328 20     Temp 10/09/20 1328 98.5 F (36.9 C)     Temp Source 10/09/20 1328 Oral     SpO2 10/09/20 1328 97 %  Weight --      Height --      Head Circumference --      [place] Flow --      Pain Score 10/09/20 1326 4     Pain Loc --      Pain Edu? --      Excl. in [place]? --    No data found.  Updated Vital Signs BP 112/64 (BP Location: Right Arm)   Pulse 88   Temp 98.5 F (36.9 C) (Oral)   Resp 20   LMP 04/21/2020   SpO2 97%   Visual Acuity Right Eye Distance:   Left Eye Distance:   Bilateral Distance:    Right Eye Near:   Left Eye Near:    Bilateral Near:     Physical Exam Vitals reviewed.  Constitutional:      General: She is not in acute distress.    Appearance: Normal appearance. She is not ill-appearing.  HENT:     Head: Normocephalic and atraumatic.     Right Ear: Hearing, tympanic membrane, ear canal and external ear normal. No swelling or tenderness. There is no impacted cerumen. No mastoid tenderness. Tympanic membrane is not perforated, erythematous, retracted or bulging.     Left Ear: Hearing, tympanic membrane, ear canal and external ear normal. No swelling or tenderness. There is no impacted cerumen. No mastoid tenderness. Tympanic membrane is not perforated, erythematous, retracted or bulging.     Nose:     Right Sinus: No maxillary sinus tenderness or frontal sinus tenderness.     Left Sinus: No maxillary sinus tenderness or frontal sinus tenderness.     Mouth/Throat:     Mouth: Mucous membranes are moist.     Pharynx: Uvula midline. No oropharyngeal exudate or posterior oropharyngeal erythema.     Tonsils: No tonsillar exudate.  Cardiovascular:     Rate and Rhythm:  Normal rate and regular rhythm.     Heart sounds: Normal heart sounds.  Pulmonary:     Breath sounds: Normal breath sounds and air entry. No wheezing, rhonchi or rales.  Chest:     Chest wall: No tenderness.  Abdominal:     General: Abdomen is flat and protuberant. Bowel sounds are normal.     Tenderness: There is no abdominal tenderness. There is no right CVA tenderness, left CVA tenderness, guarding or rebound. Negative signs include Murphy's sign, Rovsing's sign and McBurney's sign.     Comments: pregnant  Lymphadenopathy:     Cervical: No cervical adenopathy.  Neurological:     General: No focal deficit present.     Mental Status: She is alert and oriented to person, place, and time.  Psychiatric:        Attention and Perception: Attention and perception normal.        Mood and Affect: Mood and affect normal.        Behavior: Behavior normal. Behavior is cooperative.        Thought Content: Thought content normal.        Judgment: Judgment normal.      UC Treatments / Results  Labs (all labs ordered are listed, but only abnormal results are displayed) Labs Reviewed  RESP PANEL BY RT-PCR (FLU A&B, COVID) [id number]    EKG   Radiology No results found.  Procedures Procedures (including critical care time)  Medications Ordered in UC Medications - No data to display  Initial Impression / Assessment and Plan / UC Course  I have reviewed the triage  vital signs and the nursing notes.  Pertinent labs & imaging results that were available during my care of the patient were reviewed by me and considered in my medical decision making (see chart for details).     [redacted] weeks pregnant. Candidate for monoclonial antibody if covid positive. She will discuss this when we call her if test is positive (which is likely given positive exposure to family member with covid). Also rec close f/u with gyn if she develops any abd/vaginal pain, vaginal bleeding, new/high fevers, chest pain,  shortness of breath. She verbalizes understanding and agreement.  Covid and influenza tests sent today. Patient is fully vaccinated for covid-19. Isolation precautions per CDC guidelines until negative result. Symptomatic relief with OTC Mucinex, tylenol, etc. Return precautions- new/worsening fevers/chills, shortness of breath, chest pain, abd pain, etc.   List of medications that are safe in pregnancy provided today. Continue tylenol for fevers/chills, body aches, headaches. She understands not to take ibuprofen.  Final Clinical Impressions(s) / UC Diagnoses   Final diagnoses:  Suspected COVID-19 virus infection  Exposure to COVID-19 virus     Discharge Instructions       We are currently awaiting result of your PCR covid-19 test. Please isolate at home while awaiting these results. If your test is positive for Covid-19, continue to isolate at home for a total of 10 days from symptom onset. Treat your symptoms at home with OTC remedies like tylenol/ibuprofen, mucinex, nyquil, etc. Seek medical attention if you develop high fevers, chest pain, shortness of breath, ear pain, facial pain, etc. Make sure to get up and move around every 2-3 hours while convalescing to help prevent blood clots. Drink plenty of fluids, and rest as much as possible.     ED Prescriptions    None     PDMP not reviewed this encounter.   Hazel Sams, PA-C 10/09/20 1539

## 2020-10-09 NOTE — Discharge Instructions (Addendum)
° °  We are currently awaiting result of your PCR covid-19 test. Please isolate at home while awaiting these results. If your test is positive for Covid-19, continue to isolate at home for a total of 10 days from symptom onset. Treat your symptoms at home with OTC remedies like tylenol, mucinex, nyquil, etc. Seek medical attention if you develop high fevers, chest pain, shortness of breath, ear pain, facial pain, etc. Make sure to get up and move around every 2-3 hours while convalescing to help prevent blood clots. Drink plenty of fluids, and rest as much as possible.

## 2020-10-09 NOTE — ED Triage Notes (Signed)
PT C/O: cold sx onset 2 days associated w/nasal congestion, body aches  Fiance tested positive for COVID this week  Pt is 24 weeks preg.   DENIES: f/v/d  TAKING MEDS: OTC acetaminopehn  A&O x4... NAD... Ambulatory

## 2020-10-10 ENCOUNTER — Encounter: Payer: BC Managed Care – PPO | Admitting: Obstetrics and Gynecology

## 2020-10-11 ENCOUNTER — Ambulatory Visit (INDEPENDENT_AMBULATORY_CARE_PROVIDER_SITE_OTHER): Payer: Medicare Other | Admitting: Obstetrics & Gynecology

## 2020-10-11 ENCOUNTER — Other Ambulatory Visit: Payer: Self-pay

## 2020-10-11 VITALS — BP 104/70 | HR 89 | Wt 201.0 lb

## 2020-10-11 DIAGNOSIS — O24319 Unspecified pre-existing diabetes mellitus in pregnancy, unspecified trimester: Secondary | ICD-10-CM

## 2020-10-11 DIAGNOSIS — O30049 Twin pregnancy, dichorionic/diamniotic, unspecified trimester: Secondary | ICD-10-CM

## 2020-10-11 DIAGNOSIS — D582 Other hemoglobinopathies: Secondary | ICD-10-CM

## 2020-10-11 DIAGNOSIS — O0992 Supervision of high risk pregnancy, unspecified, second trimester: Secondary | ICD-10-CM

## 2020-10-11 DIAGNOSIS — Z3A25 25 weeks gestation of pregnancy: Secondary | ICD-10-CM

## 2020-10-11 NOTE — Progress Notes (Signed)
OB Twins , BTS signed today.  Reports no problems today.

## 2020-10-11 NOTE — Progress Notes (Signed)
   PRENATAL VISIT NOTE  Subjective:  Tiffany Hall is a 35 y.o. [id number] at [redacted]w[redacted]d being seen today for ongoing prenatal care.  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  Patient reports backache and sciata pain.  Contractions: Irritability. Vag. Bleeding: None.  Movement: Present. Denies leaking of fluid.   She did not bring her blood sugar levels with her.  She reports fasting BS are all <97.  1 hr post prandial are no higher than 136.  She is drinking sweet tea today and we discussed this is not part of recommended dietary intake.    The following portions of the patient's history were reviewed and updated as appropriate: allergies, current medications, past family history, past medical history, past social history, past surgical history and problem list.   Objective:   Vitals:   10/11/20 1328  BP: 104/70  Pulse: 89  Weight: 201 lb (91.2 kg)    Fetal Status: Fetal Heart Rate (bpm): 151/149 Fundal Height: 33 cm Movement: Present     General:  Alert, oriented and cooperative. Patient is in no acute distress.  Skin: Skin is warm and dry. No rash noted.   Cardiovascular: Normal heart rate noted  Respiratory: Normal respiratory effort, no problems with respiration noted  Abdomen: Soft, gravid, appropriate for gestational age.  Pain/Pressure: Absent     Pelvic: Cervical exam deferred        Extremities: Normal range of motion.  Edema: None  Mental Status: Normal mood and affect. Normal behavior. Normal judgment and thought content.   Assessment and  Plan:  Pregnancy: [id number] at [redacted]w[redacted]d 1. [redacted] weeks gestation of pregnancy -Follow up 2 weeks -ultrasound scheduled 10/31/2020.  Monthly growth scans planned. -On PNV, folic acid, baby ASA  2. Supervision of high risk pregnancy in second trimester  3. Pre-existing diabetes mellitus affecting pregnancy, antepartum -on metformin 500mg  bid.  RFs not needed.  4. Dichorionic diamniotic twin pregnancy, antepartum  5. Hemoglobin C trait (HCC)  Preterm labor symptoms and general obstetric precautions including but not limited to vaginal bleeding, contractions, leaking of fluid and fetal movement were reviewed in detail with the patient. Please refer to After Visit Summary for other counseling recommendations.   Return in about 2 weeks (around 10/25/2020).  Future Appointments  Date Time Provider Department Center  10/31/2020 12:30 PM [facility] NURSE [facility]  10/31/2020 12:45 PM [facility]    [facility] MD

## 2020-10-24 ENCOUNTER — Encounter: Payer: Medicare Other | Admitting: Obstetrics and Gynecology

## 2020-10-27 ENCOUNTER — Encounter (HOSPITAL_COMMUNITY): Payer: Self-pay | Admitting: Obstetrics and Gynecology

## 2020-10-27 ENCOUNTER — Inpatient Hospital Stay (HOSPITAL_COMMUNITY)
Admission: AD | Admit: 2020-10-27 | Discharge: 2020-10-27 | Disposition: A | Discharge status: Home / Self Care | Payer: Medicare Other | Attending: Obstetrics and Gynecology | Admitting: Obstetrics and Gynecology

## 2020-10-27 ENCOUNTER — Other Ambulatory Visit: Payer: Self-pay

## 2020-10-27 ENCOUNTER — Telehealth (INDEPENDENT_AMBULATORY_CARE_PROVIDER_SITE_OTHER): Payer: Medicare Other | Admitting: Obstetrics

## 2020-10-27 ENCOUNTER — Encounter: Payer: Self-pay | Admitting: Obstetrics

## 2020-10-27 DIAGNOSIS — R102 Pelvic and perineal pain: Secondary | ICD-10-CM | POA: Diagnosis not present

## 2020-10-27 DIAGNOSIS — Z3689 Encounter for other specified antenatal screening: Secondary | ICD-10-CM

## 2020-10-27 DIAGNOSIS — R1032 Left lower quadrant pain: Secondary | ICD-10-CM | POA: Insufficient documentation

## 2020-10-27 DIAGNOSIS — Z3A27 27 weeks gestation of pregnancy: Secondary | ICD-10-CM | POA: Insufficient documentation

## 2020-10-27 DIAGNOSIS — Z880 Allergy status to penicillin: Secondary | ICD-10-CM | POA: Insufficient documentation

## 2020-10-27 DIAGNOSIS — Z885 Allergy status to narcotic agent status: Secondary | ICD-10-CM | POA: Insufficient documentation

## 2020-10-27 DIAGNOSIS — Z87891 Personal history of nicotine dependence: Secondary | ICD-10-CM | POA: Insufficient documentation

## 2020-10-27 DIAGNOSIS — O0990 Supervision of high risk pregnancy, unspecified, unspecified trimester: Secondary | ICD-10-CM

## 2020-10-27 DIAGNOSIS — D582 Other hemoglobinopathies: Secondary | ICD-10-CM

## 2020-10-27 DIAGNOSIS — O26899 Other specified pregnancy related conditions, unspecified trimester: Secondary | ICD-10-CM

## 2020-10-27 DIAGNOSIS — O30049 Twin pregnancy, dichorionic/diamniotic, unspecified trimester: Secondary | ICD-10-CM

## 2020-10-27 DIAGNOSIS — O24319 Unspecified pre-existing diabetes mellitus in pregnancy, unspecified trimester: Secondary | ICD-10-CM

## 2020-10-27 DIAGNOSIS — O0992 Supervision of high risk pregnancy, unspecified, second trimester: Secondary | ICD-10-CM

## 2020-10-27 DIAGNOSIS — R109 Unspecified abdominal pain: Secondary | ICD-10-CM | POA: Diagnosis present

## 2020-10-27 DIAGNOSIS — O26892 Other specified pregnancy related conditions, second trimester: Secondary | ICD-10-CM | POA: Diagnosis not present

## 2020-10-27 DIAGNOSIS — O99210 Obesity complicating pregnancy, unspecified trimester: Secondary | ICD-10-CM

## 2020-10-27 DIAGNOSIS — O09299 Supervision of pregnancy with other poor reproductive or obstetric history, unspecified trimester: Secondary | ICD-10-CM

## 2020-10-27 LAB — URINALYSIS, ROUTINE W REFLEX MICROSCOPIC
Bilirubin Urine: NEGATIVE
Glucose, UA: NEGATIVE mg/dL
Hgb urine dipstick: NEGATIVE
Ketones, ur: NEGATIVE mg/dL
Leukocytes,Ua: NEGATIVE
Nitrite: NEGATIVE
Protein, ur: NEGATIVE mg/dL
Specific Gravity, Urine: 1.012 (ref 1.005–1.030)
pH: 6 (ref 5.0–8.0)

## 2020-10-27 LAB — FETAL FIBRONECTIN: Fetal Fibronectin: NEGATIVE

## 2020-10-27 MED ORDER — CYCLOBENZAPRINE HCL 5 MG PO TABS
10.0000 mg | ORAL_TABLET | Freq: Once | ORAL | Status: AC
  Administered 2020-10-27: 10 mg via ORAL
  Filled 2020-10-27: qty 2

## 2020-10-27 NOTE — Discharge Instructions (Signed)

## 2020-10-27 NOTE — MAU Note (Signed)
Pt reports she has had cramping on and off for 2 days. Started having sharp pain on her left side this morning. Good fetal movement felt. Denies any vag bleeding or leaking at this time also c/o seeing blacks spots in her vision at times.

## 2020-10-27 NOTE — Progress Notes (Signed)
Patient is at MAU.  Will need to reschedule.  Shelly Bombard, MD 10/27/2020 11:03 AM

## 2020-10-27 NOTE — MAU Provider Note (Addendum)
Chief Complaint:  Abdominal Pain   Event Date/Time   First Provider Initiated Contact with Patient 10/27/20 0754     HPI  HPI: Tiffany Hall is a 35 y.o. [id number] at 48w0dho presents to maternity admissions reporting cramping for 2 days.  Has some sharp pains in LLQ. She reports good fetal movement, denies LOF, vaginal bleeding, vaginal itching/burning, urinary symptoms, h/a, dizziness, n/v, diarrhea, constipation or fever/chills  RN Note: Pt reports she has had cramping on and off for 2 days. Started having sharp pain on her left side this morning. Good fetal movement felt. Denies any vag bleeding or leaking at this time also c/o seeing blacks spots in her vision at times.  Past Medical History: Past Medical History:  Diagnosis Date  . Anemia   . Anxiety   . Depression   . GERD (gastroesophageal reflux disease)    medication not needed  . Heart murmur   . History of abnormal cervical Pap smear    2009  . History of cardiac murmur as a child   . History of chlamydia    2008  . History of gallstones    2012  . History of gonorrhea    2008  . History of ovarian cyst   . History of trichomonal vaginitis    2008  . Hypertension    PIH with pregnancy  . Migraine headache   . Preeclampsia in postpartum period 01/15/2015  . Sickle cell trait ([place]   . Vaginal Pap smear, abnormal    HPV +    Past obstetric history: OB History  Gravida Para Term Preterm AB Living  _0 0 2 5  SAB IAB Ectopic Multiple Live Births  2 0 0 0 3    # Outcome Date GA Lbr Len/2nd Weight Sex Delivery Anes PTL Lv  8 Current           7 Term 01/10/15 347w2d7:41 / 00:06 3275 g M Charlynn CourtPI  LIV  6 SAB 2016          5 Term 05/22/12 3963w2d:23 / 00:14 3140 g F Vag-Spont EPI  LIV  4 SAB 2009          3 Term 01/04/07 40w11w0d00 2920 g F Vag-Spont EPI N   2 Term 07/01/05 38w067w0d0 3374 g F Vag-Spont EPI Y LIV  1 Term 03/04/04 42w0d13w0d 3260 g F Vag-Spont EPI N     Past Surgical  History: Past Surgical History:  Procedure Laterality Date  . COLPOSCOPY    . dilatation and currettage  N/A   . DILATION AND CURETTAGE OF UTERUS  2009   W/  SUCTION --  required blood transfusion  . DILATION AND EVACUATION N/A 09/14/2015   Procedure: DILATATION AND EVACUATION;  Surgeon: Megan Linda Hedges [facility] [place] Service: Gynecology;  Laterality: N/A;    Family History: Family History  Problem Relation Age of Onset  . Diabetes Mother   . Hypertension Mother   . Hepatitis Mother   . Cancer Mother 23    29 Breast, cervical  . Diabetes Maternal Grandmother   . Hypertension Maternal Grandmother   . Cancer Maternal Grandmother 40       Breast  . Anesthesia problems Neg Hx   . Other Neg Hx     Social History: Social History   Tobacco Use  . Smoking status: Former Smoker    Packs/day: 0.00    Types: Cigarettes  Quit date: 11/05/2010    Years since quitting: 9.9  . Smokeless tobacco: Never Used  . Tobacco comment: quit with preg  Vaping Use  . Vaping Use: Never used  Substance Use Topics  . Alcohol use: No    Comment: occ  . Drug use: No    Types: MDMA (Ecstacy)    Comment: prior to this pregnancy in 2009    Allergies:  Allergies  Allergen Reactions  . Amoxicillin Swelling    Did it involve swelling of the face/tongue/throat, SOB, or low BP? No Did it involve sudden or severe rash/hives, skin peeling, or any reaction on the inside of your mouth or nose? No Did you need to seek medical attention at a hospital or doctor's office? No When did it last happen?last year If all above answers are "NO", may proceed with cephalosporin use.   . Nubain [Nalbuphine Hcl] Swelling, Palpitations and Other (See Comments)    Pt states that she is able to take Percocet without any problems.       Meds:  Medications Prior to Admission  Medication Sig Dispense Refill Last Dose  . Accu-Chek Softclix Lancets lancets Use as instructed 100 each 12   . Acetaminophen  (TYLENOL EXTRA STRENGTH PO) Take by mouth.     [name] aspirin EC 81 MG tablet Take 1 tablet (81 mg total) by mouth daily. Take after 12 weeks for prevention of preeclampsia later in pregnancy 300 tablet 2   . Blood Glucose Monitoring Suppl (ACCU-CHEK GUIDE) w/Device KIT 1 Device by Does not apply route 4 (four) times daily. 1 kit 0   . Blood Pressure Monitoring (BLOOD PRESSURE KIT) DEVI 1 kit by Does not apply route once a week. Check Blood Pressure regularly and record readings into the [employer] App.  Large Cuff.  DX O90.0 (Patient not taking: Reported on 07/13/2020) 1 each 0   . docusate sodium (COLACE) 100 MG capsule Take 100 mg by mouth 2 (two) times daily.     . ferrous sulfate 325 (65 FE) MG tablet Take 1 tablet (325 mg total) by mouth 2 (two) times daily with a meal. 60 tablet 5   . folic acid (FOLVITE) 1 MG tablet Take 1 tablet (1 mg total) by mouth daily. 90 tablet 2   . glucose blood (ACCU-CHEK GUIDE) test strip Use to check blood sugars four times a day was instructed 50 each 12   . metFORMIN (GLUCOPHAGE) 500 MG tablet Take 1 tablet (500 mg total) by mouth 2 (two) times daily with a meal. 120 tablet 6   . pantoprazole (PROTONIX) 40 MG tablet Take 1 tablet (40 mg total) by mouth daily. 30 tablet 1   . Prenatal Vit-Fe Fumarate-FA (MULTIVITAMIN-PRENATAL) 27-0.8 MG TABS tablet Take 1 tablet by mouth daily at 12 noon.       I have reviewed patient's Past Medical Hx, Surgical Hx, Family Hx, Social Hx, medications and allergies.   ROS:  Review of Systems Other systems negative  Physical Exam   Patient Vitals for the past 24 hrs:  BP Temp Pulse Resp Height Weight  10/27/20 0707 117/74 98.4 F (36.9 C) (!) 104 18 _0  (1.626 m) 92.1 kg   Constitutional: Well-developed, well-nourished female in no acute distress.  Cardiovascular: normal rate and rhythm Respiratory: normal effort, clear to auscultation bilaterally GI: Abd soft, non-tender, gravid appropriate for gestational age.   No  rebound or guarding. MS: Extremities nontender, no edema, normal ROM Neurologic: Alert and oriented x 4.  GU: Neg CVAT.  PELVIC EXAM:  Fetal fibronectin sent Dilation: Closed Effacement (%): Thick Exam by:: Gaylan Gerold, CNM  FHT:  Baseline 145 , moderate variability, accelerations present, no decelerations Contractions:  Irregular     Labs: Results for orders placed or performed during the hospital encounter of 10/27/20 (from the past 24 hour(s))  Fetal fibronectin     Status: None   Collection Time: 10/27/20  7:54 AM  Result Value Ref Range   Fetal Fibronectin NEGATIVE NEGATIVE  Urinalysis, Routine w reflex microscopic Urine, Clean Catch     Status: Abnormal   Collection Time: 10/27/20  9:02 AM  Result Value Ref Range   Color, Urine YELLOW YELLOW   APPearance HAZY (A) CLEAR   Specific Gravity, Urine 1.012 1.005 - 1.030   pH 6.0 5.0 - 8.0   Glucose, UA NEGATIVE NEGATIVE mg/dL   Hgb urine dipstick NEGATIVE NEGATIVE   Bilirubin Urine NEGATIVE NEGATIVE   Ketones, ur NEGATIVE NEGATIVE mg/dL   Protein, ur NEGATIVE NEGATIVE mg/dL   Nitrite NEGATIVE NEGATIVE   Leukocytes,Ua NEGATIVE NEGATIVE   RBC / HPF 0-5 0 - 5 RBC/hpf   WBC, UA 0-5 0 - 5 WBC/hpf   Bacteria, UA RARE (A) NONE SEEN   Squamous Epithelial / LPF 0-5 0 - 5   Mucus PRESENT     O/Positive/-- (10/06 1425)  Imaging:    MAU Course/MDM: I have ordered labs and reviewed results.  NST reviewed, reassuring both twins. Assessment: Twin IUP at 76w0dPelvic cramping.   Plan: Care turned over to [place] MSN Certified Nurse-Midwife 10/27/2020 7:54 AM   Care assumed at 0800  Rechecked pt's cervix, no change - remains closed, thick and posterior. Very few irregular contractions picked up by TOCO. On exam, the LLQ pain is where twin A is leaning heavily, pt notes the pain started when she felt twin A shift to that location.  Reviewed stretches, massage and positioning to decrease pain  and keep babies well aligned in her abdomen/pelvis. Pt has a maternity belt, encouraged her to begin wearing it daily. Pt amenable to plan.  Discharge to home in stable condition with second trimester/preterm labor precautions.  [place] [facility] [place], MSN, [facility], [place]

## 2020-10-27 NOTE — Progress Notes (Signed)
Virtual ROB 27w MAU visit today for cramping and visual changes.   Pt is currently still at MAU pt advisedto contact office to R/S appt and provider will be made aware

## 2020-10-31 ENCOUNTER — Other Ambulatory Visit: Payer: Self-pay | Admitting: *Deleted

## 2020-10-31 ENCOUNTER — Other Ambulatory Visit: Payer: Self-pay

## 2020-10-31 ENCOUNTER — Encounter: Payer: Self-pay | Admitting: *Deleted

## 2020-10-31 ENCOUNTER — Ambulatory Visit: Payer: Medicare Other | Admitting: *Deleted

## 2020-10-31 ENCOUNTER — Ambulatory Visit: Payer: Medicare Other | Attending: Obstetrics and Gynecology

## 2020-10-31 DIAGNOSIS — Z7984 Long term (current) use of oral hypoglycemic drugs: Secondary | ICD-10-CM

## 2020-10-31 DIAGNOSIS — O30042 Twin pregnancy, dichorionic/diamniotic, second trimester: Secondary | ICD-10-CM | POA: Insufficient documentation

## 2020-10-31 DIAGNOSIS — O358XX2 Maternal care for other (suspected) fetal abnormality and damage, fetus 2: Secondary | ICD-10-CM | POA: Diagnosis not present

## 2020-10-31 DIAGNOSIS — O24112 Pre-existing diabetes mellitus, type 2, in pregnancy, second trimester: Secondary | ICD-10-CM | POA: Diagnosis not present

## 2020-10-31 DIAGNOSIS — O24319 Unspecified pre-existing diabetes mellitus in pregnancy, unspecified trimester: Secondary | ICD-10-CM | POA: Diagnosis present

## 2020-10-31 DIAGNOSIS — E119 Type 2 diabetes mellitus without complications: Secondary | ICD-10-CM

## 2020-10-31 DIAGNOSIS — O99210 Obesity complicating pregnancy, unspecified trimester: Secondary | ICD-10-CM

## 2020-10-31 DIAGNOSIS — O30043 Twin pregnancy, dichorionic/diamniotic, third trimester: Secondary | ICD-10-CM

## 2020-10-31 DIAGNOSIS — E669 Obesity, unspecified: Secondary | ICD-10-CM

## 2020-10-31 DIAGNOSIS — O09299 Supervision of pregnancy with other poor reproductive or obstetric history, unspecified trimester: Secondary | ICD-10-CM | POA: Diagnosis present

## 2020-10-31 DIAGNOSIS — Z362 Encounter for other antenatal screening follow-up: Secondary | ICD-10-CM

## 2020-10-31 DIAGNOSIS — Z3A27 27 weeks gestation of pregnancy: Secondary | ICD-10-CM

## 2020-10-31 DIAGNOSIS — O99212 Obesity complicating pregnancy, second trimester: Secondary | ICD-10-CM | POA: Diagnosis not present

## 2020-10-31 DIAGNOSIS — O322XX1 Maternal care for transverse and oblique lie, fetus 1: Secondary | ICD-10-CM

## 2020-10-31 DIAGNOSIS — Z148 Genetic carrier of other disease: Secondary | ICD-10-CM

## 2020-10-31 DIAGNOSIS — O321XX2 Maternal care for breech presentation, fetus 2: Secondary | ICD-10-CM

## 2020-11-02 ENCOUNTER — Other Ambulatory Visit: Payer: Self-pay

## 2020-11-02 ENCOUNTER — Ambulatory Visit (INDEPENDENT_AMBULATORY_CARE_PROVIDER_SITE_OTHER): Payer: Medicare Other | Admitting: Obstetrics and Gynecology

## 2020-11-02 ENCOUNTER — Encounter: Payer: Self-pay | Admitting: Obstetrics and Gynecology

## 2020-11-02 VITALS — BP 113/75 | HR 96 | Wt 202.0 lb

## 2020-11-02 DIAGNOSIS — Z23 Encounter for immunization: Secondary | ICD-10-CM | POA: Diagnosis not present

## 2020-11-02 DIAGNOSIS — O30049 Twin pregnancy, dichorionic/diamniotic, unspecified trimester: Secondary | ICD-10-CM

## 2020-11-02 DIAGNOSIS — O09299 Supervision of pregnancy with other poor reproductive or obstetric history, unspecified trimester: Secondary | ICD-10-CM

## 2020-11-02 DIAGNOSIS — Z3009 Encounter for other general counseling and advice on contraception: Secondary | ICD-10-CM | POA: Insufficient documentation

## 2020-11-02 DIAGNOSIS — O24319 Unspecified pre-existing diabetes mellitus in pregnancy, unspecified trimester: Secondary | ICD-10-CM

## 2020-11-02 DIAGNOSIS — O0993 Supervision of high risk pregnancy, unspecified, third trimester: Secondary | ICD-10-CM

## 2020-11-02 NOTE — Progress Notes (Signed)
Subjective:  Tiffany Hall is a 35 y.o. [id number] at [redacted]w[redacted]d being seen today for ongoing prenatal care.  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  Patient reports general discomforts of pregnancy.  Contractions: Irritability. Vag. Bleeding: None.  Movement: Present. Denies leaking of fluid.   The following portions of the patient's history were reviewed and updated as appropriate: allergies, current medications, past family history, past medical history, past social history, past surgical history and problem list. Problem list updated.  Objective:   Vitals:   11/02/20 1322  BP: 113/75  Pulse: 96  Weight: 202 lb (91.6 kg)    Fetal Status: Fetal Heart Rate (bpm): A:150 B:156   Movement: Present     General:  Alert, oriented and cooperative. Patient is in no acute distress.  Skin: Skin is warm and dry. No rash noted.   Cardiovascular: Normal heart rate noted  Respiratory: Normal respiratory effort, no problems with respiration noted  Abdomen: Soft, gravid, appropriate for gestational age. Pain/Pressure: Absent     Pelvic:  Cervical exam deferred        Extremities: Normal range of motion.  Edema: Trace  Mental Status: Normal mood and affect. Normal behavior. Normal judgment and thought content.   Urinalysis:      Assessment and Plan:  Pregnancy: [id number] at [redacted]w[redacted]d  1. Supervision of high risk pregnancy in third trimester Stable Flu and Tdap vaccine today - CBC - HIV antibody (with reflex) - RPR  2. Pre-existing diabetes  mellitus affecting pregnancy, antepartum CBG's in goal range Serial growths scans Antenatal testing starting at 32 weeks  3. History of pre-eclampsia in prior pregnancy, currently pregnant BP stable No S/Sx at present Continue with qd BASA  4. Dichorionic diamniotic twin pregnancy, antepartum Serial growth scans  5. Unwanted fertility BTL papers signed   Preterm labor symptoms and general obstetric precautions including but not limited to vaginal bleeding, contractions, leaking of fluid and fetal movement were reviewed in detail with the patient. Please refer to After Visit Summary for other counseling recommendations.  Return in about 2 weeks (around 11/16/2020) for OB visit, face to face, MD only.   Chancy Milroy, MD

## 2020-11-02 NOTE — Progress Notes (Addendum)
Twins HOB, reports no problems today.  She forgot to bring BS record.  TDAP and FLU vaccines given in RD, tolerated well. Negative GLU and Protein.  BS Fasting 97-98; after meals 110-150

## 2020-11-02 NOTE — Patient Instructions (Signed)

## 2020-11-03 LAB — CBC
Hematocrit: 31.1 % — ABNORMAL LOW (ref 34.0–46.6)
Hemoglobin: 10.4 g/dL — ABNORMAL LOW (ref 11.1–15.9)
MCH: 26.5 pg — ABNORMAL LOW (ref 26.6–33.0)
MCHC: 33.4 g/dL (ref 31.5–35.7)
MCV: 79 fL (ref 79–97)
Platelets: 294 10*3/uL (ref 150–450)
RBC: 3.92 x10E6/uL (ref 3.77–5.28)
RDW: 15.4 % (ref 11.7–15.4)
WBC: 7.4 10*3/uL (ref 3.4–10.8)

## 2020-11-03 LAB — HIV ANTIBODY (ROUTINE TESTING W REFLEX): HIV Screen 4th Generation wRfx: NONREACTIVE

## 2020-11-03 LAB — RPR: RPR Ser Ql: NONREACTIVE

## 2020-11-16 ENCOUNTER — Encounter: Payer: Self-pay | Admitting: Obstetrics and Gynecology

## 2020-11-16 ENCOUNTER — Other Ambulatory Visit: Payer: Self-pay

## 2020-11-16 ENCOUNTER — Ambulatory Visit (INDEPENDENT_AMBULATORY_CARE_PROVIDER_SITE_OTHER): Payer: Medicare Other | Admitting: Obstetrics and Gynecology

## 2020-11-16 VITALS — BP 118/75 | HR 83 | Wt 203.0 lb

## 2020-11-16 DIAGNOSIS — O30049 Twin pregnancy, dichorionic/diamniotic, unspecified trimester: Secondary | ICD-10-CM

## 2020-11-16 DIAGNOSIS — O0993 Supervision of high risk pregnancy, unspecified, third trimester: Secondary | ICD-10-CM

## 2020-11-16 DIAGNOSIS — O99210 Obesity complicating pregnancy, unspecified trimester: Secondary | ICD-10-CM

## 2020-11-16 DIAGNOSIS — O24319 Unspecified pre-existing diabetes mellitus in pregnancy, unspecified trimester: Secondary | ICD-10-CM

## 2020-11-16 DIAGNOSIS — O09299 Supervision of pregnancy with other poor reproductive or obstetric history, unspecified trimester: Secondary | ICD-10-CM

## 2020-11-16 DIAGNOSIS — Z3009 Encounter for other general counseling and advice on contraception: Secondary | ICD-10-CM

## 2020-11-16 DIAGNOSIS — Z3A29 29 weeks gestation of pregnancy: Secondary | ICD-10-CM

## 2020-11-16 NOTE — Progress Notes (Signed)
   PRENATAL VISIT NOTE  Subjective:  Tiffany Hall is a 35 y.o. [id number] at [redacted]w[redacted]d being seen today for ongoing prenatal care.  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  Patient reports occasional cramping. Forgets to take metformin.  Contractions: Irritability. Vag. Bleeding: None.  Movement: Present. Denies leaking of fluid.   The following portions of the patient's history were reviewed and updated as appropriate: allergies, current medications, past family history, past medical history, past social history, past surgical history and problem list.   Objective:   Vitals:   11/16/20 1001  BP: 118/75  Pulse: 83  Weight: 203 lb (92.1 kg)    Fetal Status: Fetal Heart Rate (bpm): A:148 B:144    Movement: Present     General:  Alert, oriented and cooperative. Patient is in no acute distress.  Skin: Skin is warm and dry. No rash noted.   Cardiovascular: Normal heart rate noted  Respiratory: Normal respiratory effort, no problems with respiration noted  Abdomen: Soft, gravid, appropriate for gestational age.  Pain/Pressure: Present     Pelvic: Cervical exam deferred        Extremities: Normal range of motion.  Edema: Trace  Mental Status: Normal mood and affect. Normal behavior. Normal judgment and thought content.   Assessment and Plan:  Pregnancy: [id number] at [redacted]w[redacted]d  1. Pre-existing diabetes mellitus affecting pregnancy, antepartum CBGs all within normal limits despite not taking metformin due to headache Stay off metformin for  now  2. Supervision of high risk pregnancy in third trimester   3. Dichorionic diamniotic twin pregnancy, antepartum - reviewed mode of delivery with twins, desired vaginal delivery if possible  4. Maternal obesity affecting pregnancy, antepartum  5. History of pre-eclampsia in prior pregnancy, currently pregnant BP normotensive  6. Unwanted fertility For BTL  7. [redacted] weeks gestation of pregnancy  Preterm labor symptoms and general obstetric precautions including but not limited to vaginal bleeding, contractions, leaking of fluid and fetal movement were reviewed in detail with the patient. Please refer to After Visit Summary for other counseling recommendations.   Return in about 2 weeks (around 11/30/2020) for high OB, in person.  Future Appointments  Date Time Provider [place]  11/28/2020  7:30 AM [facility] NURSE [facility]  11/28/2020  7:45 AM [facility]  12/15/2020 10:30 AM Sloan Leiter, MD [facility] None    Sloan Leiter, MD

## 2020-11-16 NOTE — Progress Notes (Addendum)
HOB/TWINS c/o HA 6/10 x 6 days.  Fasting BS readings ranges from 80-96 and 2 hrs after meals 86-120.  Urine NEG for Protein and Glucose.

## 2020-11-28 ENCOUNTER — Other Ambulatory Visit: Payer: Self-pay

## 2020-11-28 ENCOUNTER — Encounter: Payer: Self-pay | Admitting: *Deleted

## 2020-11-28 ENCOUNTER — Ambulatory Visit: Payer: Medicare Other

## 2020-11-28 ENCOUNTER — Ambulatory Visit: Payer: Medicare Other | Attending: Obstetrics

## 2020-11-28 ENCOUNTER — Other Ambulatory Visit: Payer: Self-pay | Admitting: *Deleted

## 2020-11-28 ENCOUNTER — Ambulatory Visit: Payer: Medicare Other | Admitting: *Deleted

## 2020-11-28 DIAGNOSIS — O09299 Supervision of pregnancy with other poor reproductive or obstetric history, unspecified trimester: Secondary | ICD-10-CM | POA: Diagnosis present

## 2020-11-28 DIAGNOSIS — O30043 Twin pregnancy, dichorionic/diamniotic, third trimester: Secondary | ICD-10-CM | POA: Insufficient documentation

## 2020-11-28 DIAGNOSIS — O99210 Obesity complicating pregnancy, unspecified trimester: Secondary | ICD-10-CM | POA: Insufficient documentation

## 2020-11-28 DIAGNOSIS — Z3A31 31 weeks gestation of pregnancy: Secondary | ICD-10-CM

## 2020-11-28 DIAGNOSIS — O24113 Pre-existing diabetes mellitus, type 2, in pregnancy, third trimester: Secondary | ICD-10-CM

## 2020-11-28 DIAGNOSIS — O24319 Unspecified pre-existing diabetes mellitus in pregnancy, unspecified trimester: Secondary | ICD-10-CM | POA: Diagnosis present

## 2020-11-28 DIAGNOSIS — Z148 Genetic carrier of other disease: Secondary | ICD-10-CM

## 2020-11-28 DIAGNOSIS — Z362 Encounter for other antenatal screening follow-up: Secondary | ICD-10-CM

## 2020-11-28 DIAGNOSIS — O99213 Obesity complicating pregnancy, third trimester: Secondary | ICD-10-CM | POA: Diagnosis not present

## 2020-11-28 DIAGNOSIS — O403XX2 Polyhydramnios, third trimester, fetus 2: Secondary | ICD-10-CM

## 2020-11-28 DIAGNOSIS — O321XX2 Maternal care for breech presentation, fetus 2: Secondary | ICD-10-CM

## 2020-11-29 ENCOUNTER — Encounter (HOSPITAL_COMMUNITY): Payer: Self-pay | Admitting: Obstetrics and Gynecology

## 2020-11-29 ENCOUNTER — Inpatient Hospital Stay (HOSPITAL_COMMUNITY)
Admission: AD | Admit: 2020-11-29 | Discharge: 2020-12-02 | Disposition: A | Discharge status: Home / Self Care | Payer: Medicare Other | Attending: Obstetrics and Gynecology | Admitting: Obstetrics and Gynecology

## 2020-11-29 ENCOUNTER — Other Ambulatory Visit: Payer: Self-pay

## 2020-11-29 DIAGNOSIS — E119 Type 2 diabetes mellitus without complications: Secondary | ICD-10-CM | POA: Insufficient documentation

## 2020-11-29 DIAGNOSIS — Z7984 Long term (current) use of oral hypoglycemic drugs: Secondary | ICD-10-CM | POA: Diagnosis not present

## 2020-11-29 DIAGNOSIS — Z87891 Personal history of nicotine dependence: Secondary | ICD-10-CM | POA: Diagnosis not present

## 2020-11-29 DIAGNOSIS — U071 COVID-19: Secondary | ICD-10-CM | POA: Diagnosis not present

## 2020-11-29 DIAGNOSIS — E669 Obesity, unspecified: Secondary | ICD-10-CM | POA: Diagnosis not present

## 2020-11-29 DIAGNOSIS — O24113 Pre-existing diabetes mellitus, type 2, in pregnancy, third trimester: Secondary | ICD-10-CM | POA: Insufficient documentation

## 2020-11-29 DIAGNOSIS — O99213 Obesity complicating pregnancy, third trimester: Secondary | ICD-10-CM | POA: Insufficient documentation

## 2020-11-29 DIAGNOSIS — O09523 Supervision of elderly multigravida, third trimester: Secondary | ICD-10-CM | POA: Insufficient documentation

## 2020-11-29 DIAGNOSIS — O99013 Anemia complicating pregnancy, third trimester: Secondary | ICD-10-CM | POA: Diagnosis not present

## 2020-11-29 DIAGNOSIS — O368131 Decreased fetal movements, third trimester, fetus 1: Secondary | ICD-10-CM | POA: Insufficient documentation

## 2020-11-29 DIAGNOSIS — O30043 Twin pregnancy, dichorionic/diamniotic, third trimester: Secondary | ICD-10-CM | POA: Diagnosis not present

## 2020-11-29 DIAGNOSIS — O24319 Unspecified pre-existing diabetes mellitus in pregnancy, unspecified trimester: Secondary | ICD-10-CM | POA: Diagnosis present

## 2020-11-29 DIAGNOSIS — D573 Sickle-cell trait: Secondary | ICD-10-CM | POA: Insufficient documentation

## 2020-11-29 DIAGNOSIS — Z880 Allergy status to penicillin: Secondary | ICD-10-CM | POA: Diagnosis not present

## 2020-11-29 DIAGNOSIS — O98513 Other viral diseases complicating pregnancy, third trimester: Secondary | ICD-10-CM | POA: Insufficient documentation

## 2020-11-29 DIAGNOSIS — O09299 Supervision of pregnancy with other poor reproductive or obstetric history, unspecified trimester: Secondary | ICD-10-CM

## 2020-11-29 DIAGNOSIS — O99210 Obesity complicating pregnancy, unspecified trimester: Secondary | ICD-10-CM

## 2020-11-29 DIAGNOSIS — Z79899 Other long term (current) drug therapy: Secondary | ICD-10-CM | POA: Insufficient documentation

## 2020-11-29 DIAGNOSIS — Z885 Allergy status to narcotic agent status: Secondary | ICD-10-CM | POA: Diagnosis not present

## 2020-11-29 DIAGNOSIS — O368130 Decreased fetal movements, third trimester, not applicable or unspecified: Secondary | ICD-10-CM | POA: Diagnosis present

## 2020-11-29 DIAGNOSIS — O4703 False labor before 37 completed weeks of gestation, third trimester: Secondary | ICD-10-CM

## 2020-11-29 DIAGNOSIS — Z3A31 31 weeks gestation of pregnancy: Secondary | ICD-10-CM | POA: Insufficient documentation

## 2020-11-29 LAB — CBC WITH DIFFERENTIAL/PLATELET
Abs Immature Granulocytes: 0.03 10*3/uL (ref 0.00–0.07)
Basophils Absolute: 0 10*3/uL (ref 0.0–0.1)
Basophils Relative: 1 %
Eosinophils Absolute: 0.1 10*3/uL (ref 0.0–0.5)
Eosinophils Relative: 1 %
HCT: 27.7 % — ABNORMAL LOW (ref 36.0–46.0)
Hemoglobin: 9.6 g/dL — ABNORMAL LOW (ref 12.0–15.0)
Immature Granulocytes: 1 %
Lymphocytes Relative: 7 %
Lymphs Abs: 0.4 10*3/uL — ABNORMAL LOW (ref 0.7–4.0)
MCH: 27 pg (ref 26.0–34.0)
MCHC: 34.7 g/dL (ref 30.0–36.0)
MCV: 78 fL — ABNORMAL LOW (ref 80.0–100.0)
Monocytes Absolute: 0.7 10*3/uL (ref 0.1–1.0)
Monocytes Relative: 13 %
Neutro Abs: 4.4 10*3/uL (ref 1.7–7.7)
Neutrophils Relative %: 77 %
Platelets: 216 10*3/uL (ref 150–400)
RBC: 3.55 MIL/uL — ABNORMAL LOW (ref 3.87–5.11)
RDW: 14.7 % (ref 11.5–15.5)
WBC: 5.6 10*3/uL (ref 4.0–10.5)
nRBC: 0 % (ref 0.0–0.2)

## 2020-11-29 LAB — URINALYSIS, ROUTINE W REFLEX MICROSCOPIC
Bilirubin Urine: NEGATIVE
Glucose, UA: NEGATIVE mg/dL
Ketones, ur: 20 mg/dL — AB
Leukocytes,Ua: NEGATIVE
Nitrite: NEGATIVE
Protein, ur: NEGATIVE mg/dL
Specific Gravity, Urine: 1.004 — ABNORMAL LOW (ref 1.005–1.030)
pH: 7 (ref 5.0–8.0)

## 2020-11-29 LAB — COMPREHENSIVE METABOLIC PANEL
ALT: 41 U/L (ref 0–44)
AST: 77 U/L — ABNORMAL HIGH (ref 15–41)
Albumin: 2.6 g/dL — ABNORMAL LOW (ref 3.5–5.0)
Alkaline Phosphatase: 152 U/L — ABNORMAL HIGH (ref 38–126)
Anion gap: 13 (ref 5–15)
BUN: <5 mg/dL — ABNORMAL LOW (ref 6–20)
CO2: 18 mmol/L — ABNORMAL LOW (ref 22–32)
Calcium: 9 mg/dL (ref 8.9–10.3)
Chloride: 100 mmol/L (ref 98–111)
Creatinine, Ser: 0.45 mg/dL (ref 0.44–1.00)
GFR, Estimated: >60 mL/min (ref >60)
Glucose, Bld: 92 mg/dL (ref 70–99)
Potassium: 3.8 mmol/L (ref 3.5–5.1)
Sodium: 131 mmol/L — ABNORMAL LOW (ref 135–145)
Total Bilirubin: 0.6 mg/dL (ref 0.3–1.2)
Total Protein: 6.2 g/dL — ABNORMAL LOW (ref 6.5–8.1)

## 2020-11-29 MED ORDER — LACTATED RINGERS IV BOLUS
1000.0000 mL | Freq: Once | INTRAVENOUS | Status: AC
  Administered 2020-11-29: 1000 mL via INTRAVENOUS

## 2020-11-29 MED ORDER — ONDANSETRON 4 MG PO TBDP: 4.0000 mg | ORAL_TABLET | Freq: Once | ORAL | Status: DC

## 2020-11-29 MED ORDER — NIFEDIPINE 10 MG PO CAPS
10.0000 mg | ORAL_CAPSULE | ORAL | Status: AC | PRN
  Administered 2020-11-29 (×3): 10 mg via ORAL
  Filled 2020-11-29 (×3): qty 1

## 2020-11-29 MED ORDER — CYCLOBENZAPRINE HCL 5 MG PO TABS
10.0000 mg | ORAL_TABLET | Freq: Once | ORAL | Status: AC
  Administered 2020-11-29: 10 mg via ORAL
  Filled 2020-11-29: qty 2

## 2020-11-29 MED ORDER — ACETAMINOPHEN 325 MG PO TABS
650.0000 mg | ORAL_TABLET | Freq: Once | ORAL | Status: AC
  Administered 2020-11-29: 650 mg via ORAL
  Filled 2020-11-29: qty 2

## 2020-11-29 MED ORDER — LACTATED RINGERS IV SOLN: Freq: Once | INTRAVENOUS | Status: AC

## 2020-11-29 NOTE — MAU Note (Signed)
PT SAYS SHE HAS TWINS-  FELT B MOVE WHEN ARRIVED .  FELT A MOVE LAST TIME- WAS 3 . .  FEELS SOME MILD UC'S.   [place]  WITH  [place]. [name]  CALLED OFFICE - TOLD TO COME HERE.    ALSO HAS H/A- STARTED YESTERDAY- TOOK TYLENOL 2 TABS  XS - AT 710PM AND 2PM. -  NO RELIEF.  SAYS TEMP AT HOME AT 710PM- WAS 101.4.     NEVER HAD COVID.   HAD VACCINE IN 7-21.   NO ONE IN HOME SICK.

## 2020-11-29 NOTE — MAU Provider Note (Cosign Needed Addendum)
History     CSN: [id number]  Arrival date and time: 11/29/20 2006   Event Date/Time   First Provider Initiated Contact with Patient 11/29/20 2051      Chief Complaint  Patient presents with  . Decreased Fetal Movement  . Headache   Tiffany Hall is a 35 y.o. [id number] at 6w5dwith di/di twins who presents to MAU with chief complaint of decreased fetal movement of Twin A. This is a new problem. Patient states she has not felt Twin A move since 1500 hours today. She endorses fetal movement on arrival to MAU.  Patient endorses oral temp of 101.4 and new onset headache. She is managing these complaints with Extra Strength Tylenol, last administered at 1915 hours. Patient is the only ill person in her household. She is s/p COVID vaccination; final shot of the series given 07/21. She denies SOB, cough, loss of taste or smell, activity intolerance, weakness, syncope.  Patient endorses irregular contractions, onset one week ago. She states they are unchanged in frequency or intensity since that time. She denies vaginal bleeding, LOF.  Patient receives care with [facility]   URI  This is a new problem. The current episode started yesterday. The problem has been unchanged. The maximum temperature recorded prior to her arrival was 101 - 101.9 F. Associated symptoms include abdominal pain, coughing and headaches. Pertinent negatives include no diarrhea, dysuria or vomiting. Associated symptoms comments: Headache . She has tried acetaminophen for the symptoms. The treatment provided mild relief.    OB History    Gravida  8   Para  5   Term  5   Preterm  0   AB  2   Living  5     SAB  2   IAB  0   Ectopic  0   Multiple  0   Live Births  3           Past Medical History:  Diagnosis Date  . Anemia   . Anxiety   . Depression   . GERD (gastroesophageal reflux disease)    medication not needed  . Heart murmur   . History of abnormal cervical Pap smear    2009  .  History of cardiac murmur as a child   . History of chlamydia    2008  . History of gallstones    2012  . History of gonorrhea    2008  . History of ovarian cyst   . History of trichomonal vaginitis    2008  . Hypertension    PIH with pregnancy  . Migraine headache   . Preeclampsia in postpartum period 01/15/2015  . [place]   . Vaginal Pap smear, abnormal    HPV +    Past Surgical History:  Procedure Laterality Date  . COLPOSCOPY    . dilatation and currettage  N/A   . DILATION AND CURETTAGE OF UTERUS  2009   W/  SUCTION --  required blood transfusion  . DILATION AND EVACUATION N/A 09/14/2015   Procedure: DILATATION AND EVACUATION;  Surgeon: MLinda Hedges DO;  [facility];  Service: Gynecology;  Laterality: N/A;    Family History  Problem Relation Age of Onset  . Diabetes Mother   . Hypertension Mother   . Hepatitis Mother   . Cancer Mother 235      Breast, cervical  . Diabetes Maternal Grandmother   . Hypertension Maternal Grandmother   . Cancer Maternal Grandmother  40       Breast  . Anesthesia problems Neg Hx   . Other Neg Hx     Social History   Tobacco Use  . Smoking status: Former Smoker    Packs/day: 0.00    Types: Cigarettes    Quit date: 11/05/2010    Years since quitting: 10.0  . Smokeless tobacco: Never Used  . Tobacco comment: quit with preg  Vaping Use  . Vaping Use: Never used  Substance Use Topics  . Alcohol use: No    Comment: occ  . Drug use: No    Types: MDMA (Ecstacy)    Comment: prior to this pregnancy in 2009    Allergies:  Allergies  Allergen Reactions  . Amoxicillin Swelling    Did it involve swelling of the face/tongue/throat, SOB, or low BP? No Did it involve sudden or severe rash/hives, skin peeling, or any reaction on the inside of your mouth or nose? No Did you need to seek medical attention at a hospital or doctor's office? No When did it last happen?last year If all above answers are "NO", may  proceed with cephalosporin use.   . Nubain [Nalbuphine Hcl] Swelling, Palpitations and Other (See Comments)    Pt states that she is able to take Percocet without any problems.       Medications Prior to Admission  Medication Sig Dispense Refill Last Dose  . Accu-Chek Softclix Lancets lancets Use as instructed 100 each 12 11/29/2020 at Unknown time  . Acetaminophen (TYLENOL EXTRA STRENGTH PO) Take by mouth.   11/29/2020 at Unknown time  . aspirin EC 81 MG tablet Take 1 tablet (81 mg total) by mouth daily. Take after 12 weeks for prevention of preeclampsia later in pregnancy 300 tablet 2 11/28/2020 at Unknown time  . Blood Glucose Monitoring Suppl (ACCU-CHEK GUIDE) w/Device KIT 1 Device by Does not apply route 4 (four) times daily. 1 kit 0 11/29/2020 at Unknown time  . docusate sodium (COLACE) 100 MG capsule Take 100 mg by mouth 2 (two) times daily.   11/28/2020 at Unknown time  . ferrous sulfate 325 (65 FE) MG tablet Take 1 tablet (325 mg total) by mouth 2 (two) times daily with a meal. 60 tablet 5 11/28/2020 at Unknown time  . pantoprazole (PROTONIX) 20 MG tablet Take 20 mg by mouth daily.   11/28/2020 at Unknown time  . Prenatal Vit-Fe Fumarate-FA (MULTIVITAMIN-PRENATAL) 27-0.8 MG TABS tablet Take 1 tablet by mouth daily at 12 noon.   11/28/2020 at Unknown time  . Blood Pressure Monitoring (BLOOD PRESSURE KIT) DEVI 1 kit by Does not apply route once a week. Check Blood Pressure regularly and record readings into the [employer] App.  Large Cuff.  DX O90.0 (Patient not taking: Reported on 07/13/2020) 1 each 0   . folic acid (FOLVITE) 1 MG tablet Take 1 tablet (1 mg total) by mouth daily. 90 tablet 2   . glucose blood (ACCU-CHEK GUIDE) test strip Use to check blood sugars four times a day was instructed 50 each 12   . metFORMIN (GLUCOPHAGE) 500 MG tablet Take 1 tablet (500 mg total) by mouth 2 (two) times daily with a meal. (Patient not taking: No sig reported) 120 tablet 6     Review of Systems   Constitutional: Positive for fever.  Eyes: Negative for visual disturbance.  Respiratory: Positive for cough.   Gastrointestinal: Positive for abdominal pain. Negative for diarrhea and vomiting.  Genitourinary: Negative for dysuria and vaginal bleeding.  Neurological:   Positive for headaches. Negative for syncope and weakness.  All other systems reviewed and are negative.  Physical Exam   Blood pressure 121/62, pulse (!) 114, temperature (!) 100.7 F (38.2 C), temperature source Oral, resp. rate 20, height 5' 4" (1.626 m), weight 92.4 kg, last menstrual period 04/21/2020.  Physical Exam Vitals and nursing note reviewed.  Constitutional:      Appearance: She is well-developed. She is not ill-appearing.  Cardiovascular:     Rate and Rhythm: Normal rate.     Heart sounds: Normal heart sounds.  Pulmonary:     Effort: Pulmonary effort is normal.     Breath sounds: Normal breath sounds.  Abdominal:     Palpations: Abdomen is soft.  Skin:    General: Skin is warm.  Neurological:     Mental Status: She is alert and oriented to person, place, and time.  Psychiatric:        Mood and Affect: Mood normal.        Speech: Speech normal.        Behavior: Behavior normal.     MAU Course  Procedures  Orders Placed This Encounter  Procedures  . SARS CORONAVIRUS 2 (TAT 6-24 HRS) Nasopharyngeal Nasopharyngeal Swab  . Urinalysis, Routine w reflex microscopic Urine, Clean Catch  . CBC with Differential/Platelet  . Comprehensive metabolic panel  . Airborne and Contact precautions  . Blood draw with IV start   Meds ordered this encounter  Medications  . NIFEdipine (PROCARDIA) capsule 10 mg  . lactated ringers bolus 1,000 mL  . cyclobenzaprine (FLEXERIL) tablet 10 mg   Report given to M. Jimmye Norman, CNM who assumes care of patient at this time  Mallie Snooks, MSN, CNM Certified Nurse Midwife, [employer] for [employer], [place] 11/29/20 9:51  PM   Continues to contract irregularly Procardia series ordered, had only been given one dose Continued to contract after series and an extra liter of fluid  Results for orders placed or performed during the hospital encounter of 11/29/20 (from the past 24 hour(s))  Urinalysis, Routine w reflex microscopic Urine, Clean Catch     Status: Abnormal   Collection Time: 11/29/20  8:30 PM  Result Value Ref Range   Color, Urine STRAW (A) YELLOW   APPearance CLEAR CLEAR   Specific Gravity, Urine 1.004 (L) 1.005 - 1.030   pH 7.0 5.0 - 8.0   Glucose, UA NEGATIVE NEGATIVE mg/dL   Hgb urine dipstick SMALL (A) NEGATIVE   Bilirubin Urine NEGATIVE NEGATIVE   Ketones, ur 20 (A) NEGATIVE mg/dL   Protein, ur NEGATIVE NEGATIVE mg/dL   Nitrite NEGATIVE NEGATIVE   Leukocytes,Ua NEGATIVE NEGATIVE   RBC / HPF 0-5 0 - 5 RBC/hpf   WBC, UA 0-5 0 - 5 WBC/hpf   Bacteria, UA RARE (A) NONE SEEN   Squamous Epithelial / LPF 0-5 0 - 5  CBC with Differential/Platelet     Status: Abnormal   Collection Time: 11/29/20  9:02 PM  Result Value Ref Range   WBC 5.6 4.0 - 10.5 K/uL   RBC 3.55 (L) 3.87 - 5.11 MIL/uL   Hemoglobin 9.6 (L) 12.0 - 15.0 g/dL   HCT 27.7 (L) 36.0 - 46.0 %   MCV 78.0 (L) 80.0 - 100.0 fL   MCH 27.0 26.0 - 34.0 pg   MCHC 34.7 30.0 - 36.0 g/dL   RDW 14.7 11.5 - 15.5 %   Platelets 216 150 - 400 K/uL   nRBC 0.0 0.0 -  0.2 %   Neutrophils Relative % 77 %   Neutro Abs 4.4 1.7 - 7.7 K/uL   Lymphocytes Relative 7 %   Lymphs Abs 0.4 (L) 0.7 - 4.0 K/uL   Monocytes Relative 13 %   Monocytes Absolute 0.7 0.1 - 1.0 K/uL   Eosinophils Relative 1 %   Eosinophils Absolute 0.1 0.0 - 0.5 K/uL   Basophils Relative 1 %   Basophils Absolute 0.0 0.0 - 0.1 K/uL   Immature Granulocytes 1 %   Abs Immature Granulocytes 0.03 0.00 - 0.07 K/uL  Comprehensive metabolic panel     Status: Abnormal   Collection Time: 11/29/20  9:02 PM  Result Value Ref Range   Sodium 131 (L) 135 - 145 mmol/L   Potassium 3.8 3.5 -  5.1 mmol/L   Chloride 100 98 - 111 mmol/L   CO2 18 (L) 22 - 32 mmol/L   Glucose, Bld 92 70 - 99 mg/dL   BUN <5 (L) 6 - 20 mg/dL   Creatinine, Ser 0.45 0.44 - 1.00 mg/dL   Calcium 9.0 8.9 - 10.3 mg/dL   Total Protein 6.2 (L) 6.5 - 8.1 g/dL   Albumin 2.6 (L) 3.5 - 5.0 g/dL   AST 77 (H) 15 - 41 U/L   ALT 41 0 - 44 U/L   Alkaline Phosphatase 152 (H) 38 - 126 U/L   Total Bilirubin 0.6 0.3 - 1.2 mg/dL   GFR, Estimated >60 >60 mL/min   Anion gap 13 5 - 15  SARS CORONAVIRUS 2 (TAT 6-24 HRS) Nasopharyngeal Nasopharyngeal Swab     Status: Abnormal   Collection Time: 11/29/20  9:03 PM   Specimen: Nasopharyngeal Swab  Result Value Ref Range   SARS Coronavirus 2 POSITIVE (A) NEGATIVE   Tylenol given for fever with some relief Cervix exam revealed change since initial exam Dilation: 1 Effacement (%): 60,70 Cervical Position: Middle Station: Ballotable Presentation: Vertex Exam by:: WIlliams CNM  Discussed with Dr Constant and Neonatologist Will admit for Magnesium Sulfate tocolysis  Betamethasone give at 0144  Assessment and Plan  A:   Twin Di-Di Pregnancy at [redacted]w[redacted]d        Preterm labor        Covid, new diagnosis         Elevation of one transaminase  P:   Admit to OB Specialty Care unit       Routine orders        Magnesium Sulfate tocolysis       Second dose of Betamethasone ordered for Wednesday night        MD to follow  Williams, Marie L, CNM   

## 2020-11-30 ENCOUNTER — Encounter: Payer: Medicare Other | Admitting: Obstetrics and Gynecology

## 2020-11-30 ENCOUNTER — Encounter (HOSPITAL_COMMUNITY): Payer: Self-pay | Admitting: Obstetrics and Gynecology

## 2020-11-30 DIAGNOSIS — O4703 False labor before 37 completed weeks of gestation, third trimester: Secondary | ICD-10-CM

## 2020-11-30 LAB — GLUCOSE, CAPILLARY
Glucose-Capillary: 125 mg/dL — ABNORMAL HIGH (ref 70–99)
Glucose-Capillary: 201 mg/dL — ABNORMAL HIGH (ref 70–99)
Glucose-Capillary: 182 mg/dL — ABNORMAL HIGH (ref 70–99)
Glucose-Capillary: 182 mg/dL — ABNORMAL HIGH (ref 70–99)

## 2020-11-30 LAB — TYPE AND SCREEN
ABO/RH(D): O POS
Antibody Screen: NEGATIVE

## 2020-11-30 LAB — SARS CORONAVIRUS 2 (TAT 6-24 HRS): SARS Coronavirus 2: POSITIVE — AB

## 2020-11-30 MED ORDER — BETAMETHASONE SOD PHOS & ACET 6 (3-3) MG/ML IJ SUSP
12.0000 mg | Freq: Once | INTRAMUSCULAR | Status: AC
  Administered 2020-11-30: 12 mg via INTRAMUSCULAR
  Filled 2020-11-30: qty 5

## 2020-11-30 MED ORDER — CALCIUM CARBONATE ANTACID 500 MG PO CHEW: 2.0000 | CHEWABLE_TABLET | ORAL | Status: DC | PRN

## 2020-11-30 MED ORDER — MAGNESIUM SULFATE BOLUS VIA INFUSION
4.0000 g | Freq: Once | INTRAVENOUS | Status: AC
  Administered 2020-11-30: 4 g via INTRAVENOUS
  Filled 2020-11-30: qty 1000

## 2020-11-30 MED ORDER — BETAMETHASONE SOD PHOS & ACET 6 (3-3) MG/ML IJ SUSP
12.0000 mg | Freq: Once | INTRAMUSCULAR | Status: AC
  Administered 2020-12-01: 12 mg via INTRAMUSCULAR

## 2020-11-30 MED ORDER — LACTATED RINGERS IV SOLN: INTRAVENOUS | Status: DC

## 2020-11-30 MED ORDER — DOCUSATE SODIUM 100 MG PO CAPS
100.0000 mg | ORAL_CAPSULE | Freq: Every day | ORAL | Status: DC
  Administered 2020-11-30 – 2020-12-02 (×3): 100 mg via ORAL
  Filled 2020-11-30 (×3): qty 1

## 2020-11-30 MED ORDER — MAGNESIUM SULFATE 40 GM/1000ML IV SOLN
2.0000 g/h | INTRAVENOUS | Status: DC
  Administered 2020-11-30 (×2): 2 g/h via INTRAVENOUS
  Filled 2020-11-30 (×2): qty 1000

## 2020-11-30 MED ORDER — ZOLPIDEM TARTRATE 5 MG PO TABS: 5.0000 mg | ORAL_TABLET | Freq: Every evening | ORAL | Status: DC | PRN

## 2020-11-30 MED ORDER — PRENATAL MULTIVITAMIN CH
1.0000 | ORAL_TABLET | Freq: Every day | ORAL | Status: DC
  Administered 2020-11-30 – 2020-12-02 (×3): 1 via ORAL
  Filled 2020-11-30 (×3): qty 1

## 2020-11-30 MED ORDER — ACETAMINOPHEN 325 MG PO TABS: 650.0000 mg | ORAL_TABLET | ORAL | Status: DC | PRN

## 2020-11-30 NOTE — MAU Note (Signed)
2 unit RN's unsuccessfully attempted to place new IV.  CNM made aware, IV team consult placed under orders from Hansel Feinstein, CNM.

## 2020-11-30 NOTE — MAU Note (Signed)
Pt c/o severe pain and burning at IV site immediately after beginning Magnesium bolus.  RN d/c magnesium and flushed line with normal saline.  Pt states that this provided relief and pain went away with flush.  Rn inspected IV site and found site to be painful to the touch and warm, no redness or erythema present.  RN informed CNM, RN to discontinue current IV and place another before re-starting Magnesium bolus.  Will continue to monitor for s/s of adverse reaction.  Pt has no other complaints at this time.

## 2020-11-30 NOTE — Progress Notes (Signed)
Late entry: Pt seen during morning rounds  [place] PROGRESS NOTE  Tiffany Hall is a 35 y.o. [id number] at [redacted]w[redacted]d who is admitted for Preterm labor.  Estimated Date of Delivery: 01/26/21 Fetal presentation is vertex Tiffany Hall.  Length of Stay:  0 Days. Admitted 11/29/2020  Subjective: When seen, pt was resting quietly and was in no acute distress.  She was easily awakened and noted less contractions since she had been moved to specialty care, possibly 4 contractions total. Patient reports normal fetal movement.  Occasional contractions noted,  denies bleeding and leaking of fluid per vagina.  Vitals:  Blood pressure (!) 103/57, pulse (!) 106, temperature 98.3 F (36.8 C), temperature source Oral, resp. rate 17, height 5\' 4"  (1.626 m), weight 92.4 kg, last menstrual period 04/21/2020, SpO2 100 %. Physical Examination: CONSTITUTIONAL: Well-developed, well-nourished female in no acute distress.  HENT:  Normocephalic, atraumatic, External right and left ear normal. Oropharynx is clear and moist EYES: Conjunctivae and EOM are normal.  NECK: Normal range of motion, supple, no masses. SKIN: Skin is warm and dry. No rash noted. Not diaphoretic. No erythema. No pallor. [place]: Alert and oriented to person, place, and time. Normal reflexes, muscle tone coordination. No cranial nerve deficit noted. PSYCHIATRIC: Normal mood and affect. Normal behavior. Normal judgment and thought content. CARDIOVASCULAR: Normal heart rate noted, regular rhythm RESPIRATORY: Effort and breath sounds normal, no problems with respiration noted MUSCULOSKELETAL: Normal range of motion. No edema and no tenderness. ABDOMEN: Soft, nontender, nondistended, gravid. CERVIX: deferred, checked later in the day and was 1/50/ballotable  Fetal monitoring: A: FHR: 130 bpm, Variability: moderate, Accelerations: Present, Decelerations: Absent  Fetal monitoring B:140s, variability moderate, accels present, decels:  occasional mild variable Uterine activity: irregular with irritabilty   Results for orders placed or performed during the hospital encounter of 11/29/20 (from the past 48 hour(s))  Urinalysis, Routine w reflex microscopic Urine, Clean Catch     Status: Abnormal   Collection Time: 11/29/20  8:30 PM  Result Value Ref Range   Color, Urine STRAW (A) YELLOW   APPearance CLEAR CLEAR   Specific Gravity, Urine 1.004 (L) 1.005 - 1.030   pH 7.0 5.0 - 8.0   Glucose, UA NEGATIVE NEGATIVE mg/dL   Hgb urine dipstick SMALL (A) NEGATIVE   Bilirubin Urine NEGATIVE NEGATIVE   Ketones, ur 20 (A) NEGATIVE mg/dL   Protein, ur NEGATIVE NEGATIVE mg/dL   Nitrite NEGATIVE NEGATIVE   Leukocytes,Ua NEGATIVE NEGATIVE   RBC / HPF 0-5 0 - 5 RBC/hpf   WBC, UA 0-5 0 - 5 WBC/hpf   Bacteria, UA RARE (A) NONE SEEN   Squamous Epithelial / LPF 0-5 0 - 5    Comment: Performed at [facility], [address], [place] [address]  CBC with Differential/Platelet     Status: Abnormal   Collection Time: 11/29/20  9:02 PM  Result Value Ref Range   WBC 5.6 4.0 - 10.5 K/uL   RBC 3.55 (L) 3.87 - 5.11 MIL/uL   Hemoglobin 9.6 (L) 12.0 - 15.0 g/dL   HCT 27.7 (L) 36.0 - 46.0 %   MCV 78.0 (L) 80.0 - 100.0 fL   MCH 27.0 26.0 - 34.0 pg   MCHC 34.7 30.0 - 36.0 g/dL   RDW 14.7 11.5 - 15.5 %   Platelets 216 150 - 400 K/uL   nRBC 0.0 0.0 - 0.2 %   Neutrophils Relative % 77 %   Neutro Abs 4.4 1.7 - 7.7 K/uL  Lymphocytes Relative 7 %   Lymphs Abs 0.4 (L) 0.7 - 4.0 K/uL   Monocytes Relative 13 %   Monocytes Absolute 0.7 0.1 - 1.0 K/uL   Eosinophils Relative 1 %   Eosinophils Absolute 0.1 0.0 - 0.5 K/uL   Basophils Relative 1 %   Basophils Absolute 0.0 0.0 - 0.1 K/uL   Immature Granulocytes 1 %   Abs Immature Granulocytes 0.03 0.00 - 0.07 K/uL    Comment: Performed at [facility] [address], [place], [place] [address]  Comprehensive metabolic panel     Status: Abnormal   Collection Time: 11/29/20  9:02  PM  Result Value Ref Range   Sodium 131 (L) 135 - 145 mmol/L   Potassium 3.8 3.5 - 5.1 mmol/L   Chloride 100 98 - 111 mmol/L   CO2 18 (L) 22 - 32 mmol/L   Glucose, Bld 92 70 - 99 mg/dL    Comment: Glucose reference range applies only to samples taken after fasting for at least 8 hours.   BUN <5 (L) 6 - 20 mg/dL   Creatinine, Ser 0.45 0.44 - 1.00 mg/dL   Calcium 9.0 8.9 - 10.3 mg/dL   Total Protein 6.2 (L) 6.5 - 8.1 g/dL   Albumin 2.6 (L) 3.5 - 5.0 g/dL   AST 77 (H) 15 - 41 U/L   ALT 41 0 - 44 U/L   Alkaline Phosphatase 152 (H) 38 - 126 U/L   Total Bilirubin 0.6 0.3 - 1.2 mg/dL   GFR, Estimated >60 >60 mL/min    Comment: (NOTE) Calculated using the CKD-EPI Creatinine Equation (2021)    Anion gap 13 5 - 15    Comment: Performed at [facility], [place] [address], [place], [place] [address]  SARS CORONAVIRUS 2 (TAT 6-24 HRS) Nasopharyngeal Nasopharyngeal Swab     Status: Abnormal   Collection Time: 11/29/20  9:03 PM   Specimen: Nasopharyngeal Swab  Result Value Ref Range   SARS Coronavirus 2 POSITIVE (A) NEGATIVE    Comment: (NOTE) SARS-CoV-2 target nucleic acids are DETECTED.  The SARS-CoV-2 RNA is generally detectable in upper and lower respiratory specimens during the acute phase of infection. Positive results are indicative of the presence of SARS-CoV-2 RNA. Clinical correlation with patient history and other diagnostic information is  necessary to determine patient infection status. Positive results do not rule out bacterial infection or co-infection with other viruses.  The expected result is Negative.  Fact Sheet for Patients: SugarRoll.be  Fact Sheet for Healthcare Providers: https://www.woods-mathews.com/  This test is not yet approved or cleared by the [place] FDA and  has been authorized for detection and/or diagnosis of SARS-CoV-2 by FDA under an Emergency Use Authorization (EUA). This EUA will remain  in effect  (meaning this test can be used) for the duration of the COVID-19 declaration under Section 564(b)(1) of the Act, 21 U. S.C. section 360bbb-3(b)(1), unless the authorization is terminated or revoked sooner.   Performed at [facility], [place] [address] [place], [place] [address]   Type and screen [place]     Status: None   Collection Time: 11/29/20  9:15 PM  Result Value Ref Range   ABO/RH(D) O POS    Antibody Screen NEG    Sample Expiration      12/02/2020,2359 Performed at [facility], [place] [address], [place], [place] [address]   Glucose, capillary     Status: Abnormal   Collection Time:  11/30/20  6:19 AM  Result Value Ref Range   Glucose-Capillary 125 (H) 70 - 99 mg/dL    Comment: Glucose reference range applies only to samples taken after fasting for at least 8 hours.  Glucose, capillary     Status: Abnormal   Collection Time: 11/30/20  9:57 AM  Result Value Ref Range   Glucose-Capillary 201 (H) 70 - 99 mg/dL    Comment: Glucose reference range applies only to samples taken after fasting for at least 8 hours.    I have reviewed the patient's current medications.  ASSESSMENT: Active Problems:   Pre-existing diabetes mellitus affecting pregnancy, antepartum   Preterm uterine contractions in third trimester, antepartum   PLAN: Continue magnesium sulfate for neuroprotection while waiting for second dose BMZ No significant cervical change noted Discontinue magnesium after second dose steroids Expectant management   Continue routine antenatal care.   Lynnda Shields, MD [place] Attending, Center for [facility] 11/30/2020 3:12 PM

## 2020-11-30 NOTE — Progress Notes (Signed)
OB ANTE NOTE  Called to see patient- feeling painful contractions.  Since about 11:30am she reports intermittent more painful contractions.  No LOF, no vaginal bleeding, +FM x2  O: BP (!) 103/57 (BP Location: Right Arm)   Pulse (!) 106   Temp 98.3 F (36.8 C) (Oral)   Resp 17   Ht 5\' 4"  (1.626 m)   Wt 92.4 kg   LMP 04/21/2020   SpO2 100%   BMI 34.98 kg/m   Gen: NAD Abd: soft, gravid SVE: loose 1/50/-3, ballotable FHT: 130bpm and 145bpm- Cat. 1 tracing x 2  A/P: 34yo [id number]@[redacted]w[redacted]d  -FWB: Cat I  Plan for NST q shift and continue toco monitoring   -Preterm labor  BMZ course started- 2nd dose today @ 0100  Currently on Magnesium for CP and toco prophylaxis  No cervical change noted, will consider recheck as clinically indicated -COVID positive- mild symptoms  Janyth Pupa, DO Attending [place], [place] for [employer], [place]

## 2020-11-30 NOTE — H&P (Signed)
History     CSN: [id number]  Arrival date and time: 11/29/20 2006   Event Date/Time   First Provider Initiated Contact with Patient 11/29/20 2051      Chief Complaint  Patient presents with  . Decreased Fetal Movement  . Headache   Tiffany Hall is a 35 y.o. [id number] at 6w5dwith di/di twins who presents to MAU with chief complaint of decreased fetal movement of Twin A. This is a new problem. Patient states she has not felt Twin A move since 1500 hours today. She endorses fetal movement on arrival to MAU.  Patient endorses oral temp of 101.4 and new onset headache. She is managing these complaints with Extra Strength Tylenol, last administered at 1915 hours. Patient is the only ill person in her household. She is s/p COVID vaccination; final shot of the series given 07/21. She denies SOB, cough, loss of taste or smell, activity intolerance, weakness, syncope.  Patient endorses irregular contractions, onset one week ago. She states they are unchanged in frequency or intensity since that time. She denies vaginal bleeding, LOF.  Patient receives care with [facility]   URI  This is a new problem. The current episode started yesterday. The problem has been unchanged. The maximum temperature recorded prior to her arrival was 101 - 101.9 F. Associated symptoms include abdominal pain, coughing and headaches. Pertinent negatives include no diarrhea, dysuria or vomiting. Associated symptoms comments: Headache . She has tried acetaminophen for the symptoms. The treatment provided mild relief.    OB History    Gravida  8   Para  5   Term  5   Preterm  0   AB  2   Living  5     SAB  2   IAB  0   Ectopic  0   Multiple  0   Live Births  3           Past Medical History:  Diagnosis Date  . Anemia   . Anxiety   . Depression   . GERD (gastroesophageal reflux disease)    medication not needed  . Heart murmur   . History of abnormal cervical Pap smear    2009  .  History of cardiac murmur as a child   . History of chlamydia    2008  . History of gallstones    2012  . History of gonorrhea    2008  . History of ovarian cyst   . History of trichomonal vaginitis    2008  . Hypertension    PIH with pregnancy  . Migraine headache   . Preeclampsia in postpartum period 01/15/2015  . [place]   . Vaginal Pap smear, abnormal    HPV +    Past Surgical History:  Procedure Laterality Date  . COLPOSCOPY    . dilatation and currettage  N/A   . DILATION AND CURETTAGE OF UTERUS  2009   W/  SUCTION --  required blood transfusion  . DILATION AND EVACUATION N/A 09/14/2015   Procedure: DILATATION AND EVACUATION;  Surgeon: MLinda Hedges DO;  [facility];  Service: Gynecology;  Laterality: N/A;    Family History  Problem Relation Age of Onset  . Diabetes Mother   . Hypertension Mother   . Hepatitis Mother   . Cancer Mother 235      Breast, cervical  . Diabetes Maternal Grandmother   . Hypertension Maternal Grandmother   . Cancer Maternal Grandmother  40       Breast  . Anesthesia problems Neg Hx   . Other Neg Hx     Social History   Tobacco Use  . Smoking status: Former Smoker    Packs/day: 0.00    Types: Cigarettes    Quit date: 11/05/2010    Years since quitting: 10.0  . Smokeless tobacco: Never Used  . Tobacco comment: quit with preg  Vaping Use  . Vaping Use: Never used  Substance Use Topics  . Alcohol use: No    Comment: occ  . Drug use: No    Types: MDMA (Ecstacy)    Comment: prior to this pregnancy in 2009    Allergies:  Allergies  Allergen Reactions  . Amoxicillin Swelling    Did it involve swelling of the face/tongue/throat, SOB, or low BP? No Did it involve sudden or severe rash/hives, skin peeling, or any reaction on the inside of your mouth or nose? No Did you need to seek medical attention at a hospital or doctor's office? No When did it last happen?last year If all above answers are "NO", may  proceed with cephalosporin use.   . Nubain [Nalbuphine Hcl] Swelling, Palpitations and Other (See Comments)    Pt states that she is able to take Percocet without any problems.       Medications Prior to Admission  Medication Sig Dispense Refill Last Dose  . Accu-Chek Softclix Lancets lancets Use as instructed 100 each 12 11/29/2020 at Unknown time  . Acetaminophen (TYLENOL EXTRA STRENGTH PO) Take by mouth.   11/29/2020 at Unknown time  . aspirin EC 81 MG tablet Take 1 tablet (81 mg total) by mouth daily. Take after 12 weeks for prevention of preeclampsia later in pregnancy 300 tablet 2 11/28/2020 at Unknown time  . Blood Glucose Monitoring Suppl (ACCU-CHEK GUIDE) w/Device KIT 1 Device by Does not apply route 4 (four) times daily. 1 kit 0 11/29/2020 at Unknown time  . docusate sodium (COLACE) 100 MG capsule Take 100 mg by mouth 2 (two) times daily.   11/28/2020 at Unknown time  . ferrous sulfate 325 (65 FE) MG tablet Take 1 tablet (325 mg total) by mouth 2 (two) times daily with a meal. 60 tablet 5 11/28/2020 at Unknown time  . pantoprazole (PROTONIX) 20 MG tablet Take 20 mg by mouth daily.   11/28/2020 at Unknown time  . Prenatal Vit-Fe Fumarate-FA (MULTIVITAMIN-PRENATAL) 27-0.8 MG TABS tablet Take 1 tablet by mouth daily at 12 noon.   11/28/2020 at Unknown time  . Blood Pressure Monitoring (BLOOD PRESSURE KIT) DEVI 1 kit by Does not apply route once a week. Check Blood Pressure regularly and record readings into the [employer] App.  Large Cuff.  DX O90.0 (Patient not taking: Reported on 07/13/2020) 1 each 0   . folic acid (FOLVITE) 1 MG tablet Take 1 tablet (1 mg total) by mouth daily. 90 tablet 2   . glucose blood (ACCU-CHEK GUIDE) test strip Use to check blood sugars four times a day was instructed 50 each 12   . metFORMIN (GLUCOPHAGE) 500 MG tablet Take 1 tablet (500 mg total) by mouth 2 (two) times daily with a meal. (Patient not taking: No sig reported) 120 tablet 6     Review of Systems   Constitutional: Positive for fever.  Eyes: Negative for visual disturbance.  Respiratory: Positive for cough.   Gastrointestinal: Positive for abdominal pain. Negative for diarrhea and vomiting.  Genitourinary: Negative for dysuria and vaginal bleeding.  Neurological:  Positive for headaches. Negative for syncope and weakness.  All other systems reviewed and are negative.  Physical Exam   Blood pressure 121/62, pulse (!) 114, temperature (!) 100.7 F (38.2 C), temperature source Oral, resp. rate 20, height 5' 4" (1.626 m), weight 92.4 kg, last menstrual period 04/21/2020.  Physical Exam Vitals and nursing note reviewed.  Constitutional:      Appearance: She is well-developed. She is not ill-appearing.  Cardiovascular:     Rate and Rhythm: Normal rate.     Heart sounds: Normal heart sounds.  Pulmonary:     Effort: Pulmonary effort is normal.     Breath sounds: Normal breath sounds.  Abdominal:     Palpations: Abdomen is soft.  Skin:    General: Skin is warm.  Neurological:     Mental Status: She is alert and oriented to person, place, and time.  Psychiatric:        Mood and Affect: Mood normal.        Speech: Speech normal.        Behavior: Behavior normal.     MAU Course  Procedures  Orders Placed This Encounter  Procedures  . SARS CORONAVIRUS 2 (TAT 6-24 HRS) Nasopharyngeal Nasopharyngeal Swab  . Urinalysis, Routine w reflex microscopic Urine, Clean Catch  . CBC with Differential/Platelet  . Comprehensive metabolic panel  . Airborne and Contact precautions  . Blood draw with IV start   Meds ordered this encounter  Medications  . NIFEdipine (PROCARDIA) capsule 10 mg  . lactated ringers bolus 1,000 mL  . cyclobenzaprine (FLEXERIL) tablet 10 mg   Report given to M. Jimmye Norman, CNM who assumes care of patient at this time  Mallie Snooks, MSN, CNM Certified Nurse Midwife, [employer] for [employer], [place] 11/29/20 9:51  PM   Continues to contract irregularly Procardia series ordered, had only been given one dose Continued to contract after series and an extra liter of fluid  Results for orders placed or performed during the hospital encounter of 11/29/20 (from the past 24 hour(s))  Urinalysis, Routine w reflex microscopic Urine, Clean Catch     Status: Abnormal   Collection Time: 11/29/20  8:30 PM  Result Value Ref Range   Color, Urine STRAW (A) YELLOW   APPearance CLEAR CLEAR   Specific Gravity, Urine 1.004 (L) 1.005 - 1.030   pH 7.0 5.0 - 8.0   Glucose, UA NEGATIVE NEGATIVE mg/dL   Hgb urine dipstick SMALL (A) NEGATIVE   Bilirubin Urine NEGATIVE NEGATIVE   Ketones, ur 20 (A) NEGATIVE mg/dL   Protein, ur NEGATIVE NEGATIVE mg/dL   Nitrite NEGATIVE NEGATIVE   Leukocytes,Ua NEGATIVE NEGATIVE   RBC / HPF 0-5 0 - 5 RBC/hpf   WBC, UA 0-5 0 - 5 WBC/hpf   Bacteria, UA RARE (A) NONE SEEN   Squamous Epithelial / LPF 0-5 0 - 5  CBC with Differential/Platelet     Status: Abnormal   Collection Time: 11/29/20  9:02 PM  Result Value Ref Range   WBC 5.6 4.0 - 10.5 K/uL   RBC 3.55 (L) 3.87 - 5.11 MIL/uL   Hemoglobin 9.6 (L) 12.0 - 15.0 g/dL   HCT 27.7 (L) 36.0 - 46.0 %   MCV 78.0 (L) 80.0 - 100.0 fL   MCH 27.0 26.0 - 34.0 pg   MCHC 34.7 30.0 - 36.0 g/dL   RDW 14.7 11.5 - 15.5 %   Platelets 216 150 - 400 K/uL   nRBC 0.0 0.0 -  0.2 %   Neutrophils Relative % 77 %   Neutro Abs 4.4 1.7 - 7.7 K/uL   Lymphocytes Relative 7 %   Lymphs Abs 0.4 (L) 0.7 - 4.0 K/uL   Monocytes Relative 13 %   Monocytes Absolute 0.7 0.1 - 1.0 K/uL   Eosinophils Relative 1 %   Eosinophils Absolute 0.1 0.0 - 0.5 K/uL   Basophils Relative 1 %   Basophils Absolute 0.0 0.0 - 0.1 K/uL   Immature Granulocytes 1 %   Abs Immature Granulocytes 0.03 0.00 - 0.07 K/uL  Comprehensive metabolic panel     Status: Abnormal   Collection Time: 11/29/20  9:02 PM  Result Value Ref Range   Sodium 131 (L) 135 - 145 mmol/L   Potassium 3.8 3.5 -  5.1 mmol/L   Chloride 100 98 - 111 mmol/L   CO2 18 (L) 22 - 32 mmol/L   Glucose, Bld 92 70 - 99 mg/dL   BUN <5 (L) 6 - 20 mg/dL   Creatinine, Ser 0.45 0.44 - 1.00 mg/dL   Calcium 9.0 8.9 - 10.3 mg/dL   Total Protein 6.2 (L) 6.5 - 8.1 g/dL   Albumin 2.6 (L) 3.5 - 5.0 g/dL   AST 77 (H) 15 - 41 U/L   ALT 41 0 - 44 U/L   Alkaline Phosphatase 152 (H) 38 - 126 U/L   Total Bilirubin 0.6 0.3 - 1.2 mg/dL   GFR, Estimated >60 >60 mL/min   Anion gap 13 5 - 15  SARS CORONAVIRUS 2 (TAT 6-24 HRS) Nasopharyngeal Nasopharyngeal Swab     Status: Abnormal   Collection Time: 11/29/20  9:03 PM   Specimen: Nasopharyngeal Swab  Result Value Ref Range   SARS Coronavirus 2 POSITIVE (A) NEGATIVE   Tylenol given for fever with some relief Cervix exam revealed change since initial exam Dilation: 1 Effacement (%): 60,70 Cervical Position: [place] Station: Ballotable Presentation: Vertex Exam by:: Tammey Deeg CNM  Discussed with Dr Elly Modena and Neonatologist Will admit for Magnesium Sulfate tocolysis  Betamethasone give at 0144  Assessment and Plan  A:   Twin Di-Di Pregnancy at [redacted]w[redacted]d       Preterm labor        Covid, new diagnosis         Elevation of one transaminase  P:   [facility]       Routine orders        Magnesium Sulfate tocolysis       Second dose of Betamethasone ordered for Wednesday night        MD to follow  WSeabron Spates CNM

## 2020-12-01 DIAGNOSIS — O98513 Other viral diseases complicating pregnancy, third trimester: Secondary | ICD-10-CM | POA: Diagnosis not present

## 2020-12-01 DIAGNOSIS — E119 Type 2 diabetes mellitus without complications: Secondary | ICD-10-CM

## 2020-12-01 DIAGNOSIS — O24313 Unspecified pre-existing diabetes mellitus in pregnancy, third trimester: Secondary | ICD-10-CM

## 2020-12-01 DIAGNOSIS — U071 COVID-19: Secondary | ICD-10-CM

## 2020-12-01 DIAGNOSIS — Z3A32 32 weeks gestation of pregnancy: Secondary | ICD-10-CM

## 2020-12-01 LAB — GLUCOSE, CAPILLARY
Glucose-Capillary: 158 mg/dL — ABNORMAL HIGH (ref 70–99)
Glucose-Capillary: 197 mg/dL — ABNORMAL HIGH (ref 70–99)
Glucose-Capillary: 176 mg/dL — ABNORMAL HIGH (ref 70–99)
Glucose-Capillary: 130 mg/dL — ABNORMAL HIGH (ref 70–99)

## 2020-12-01 MED ORDER — ZINC SULFATE 220 (50 ZN) MG PO CAPS
220.0000 mg | ORAL_CAPSULE | Freq: Every day | ORAL | Status: DC
  Administered 2020-12-01 – 2020-12-02 (×2): 220 mg via ORAL
  Filled 2020-12-01 (×2): qty 1

## 2020-12-01 MED ORDER — SODIUM CHLORIDE 0.9 % IV SOLN
100.0000 mg | Freq: Every day | INTRAVENOUS | Status: DC
  Administered 2020-12-02: 100 mg via INTRAVENOUS
  Filled 2020-12-01: qty 20

## 2020-12-01 MED ORDER — INSULIN REGULAR(HUMAN) IN NACL 100-0.9 UT/100ML-% IV SOLN: INTRAVENOUS | Status: DC

## 2020-12-01 MED ORDER — SODIUM CHLORIDE 0.9 % IV SOLN
200.0000 mg | Freq: Once | INTRAVENOUS | Status: AC
  Administered 2020-12-01: 200 mg via INTRAVENOUS
  Filled 2020-12-01: qty 40

## 2020-12-01 MED ORDER — NIFEDIPINE 10 MG PO CAPS: 10.0000 mg | ORAL_CAPSULE | Freq: Four times a day (QID) | ORAL | Status: DC

## 2020-12-01 MED ORDER — DEXTROSE 50 % IV SOLN: 0.0000 mL | INTRAVENOUS | Status: DC | PRN

## 2020-12-01 MED ORDER — NIFEDIPINE 10 MG PO CAPS
10.0000 mg | ORAL_CAPSULE | Freq: Four times a day (QID) | ORAL | Status: DC | PRN
  Administered 2020-12-02: 10 mg via ORAL
  Filled 2020-12-01: qty 1

## 2020-12-01 MED ORDER — ALBUTEROL SULFATE HFA 108 (90 BASE) MCG/ACT IN AERS: 2.0000 | INHALATION_SPRAY | Freq: Four times a day (QID) | RESPIRATORY_TRACT | Status: DC | PRN

## 2020-12-01 MED ORDER — ASCORBIC ACID 500 MG PO TABS
500.0000 mg | ORAL_TABLET | Freq: Every day | ORAL | Status: DC
  Administered 2020-12-01 – 2020-12-02 (×2): 500 mg via ORAL
  Filled 2020-12-01 (×3): qty 1

## 2020-12-01 MED ORDER — INSULIN ASPART 100 UNIT/ML ~~LOC~~ SOLN
0.0000 [IU] | SUBCUTANEOUS | Status: DC
  Administered 2020-12-01: 3 [IU] via SUBCUTANEOUS
  Administered 2020-12-01: 4 [IU] via SUBCUTANEOUS
  Administered 2020-12-02 (×4): 2 [IU] via SUBCUTANEOUS

## 2020-12-01 MED ORDER — DEXTROSE IN LACTATED RINGERS 5 % IV SOLN: INTRAVENOUS | Status: DC

## 2020-12-01 MED ORDER — LACTATED RINGERS IV SOLN: INTRAVENOUS | Status: DC

## 2020-12-01 NOTE — Progress Notes (Signed)
[facility] PROGRESS NOTE  LINZIE CRISS is a 35 y.o. [id number] at [redacted]w[redacted]d who is admitted for Preterm labor.  Estimated Date of Delivery: 01/26/21 Fetal presentation is vertex/ breech.  Length of Stay:  0 Days. Admitted 11/29/2020  Subjective: Pt seen and assessed.  She is overall feeling fine.  She does note a bit of a cough but no fever, chills or body aches.  She notes contractions are improved and only feels 1-2 / hour but when they come they are intense.  Pt currently is comfortable and is eating a late lunch. Patient reports normal fetal movement.   Uterine contractions as above, denies bleeding and leaking of fluid per vagina.  Vitals:  Blood pressure 111/60, pulse 89, temperature 98.3 F (36.8 C), temperature source Oral, resp. rate 18, height 5\' 4"  (1.626 m), weight 92.4 kg, last menstrual period 04/21/2020, SpO2 99 %. Physical Examination: CONSTITUTIONAL: Well-developed, well-nourished female in no acute distress.  HENT:  Normocephalic, atraumatic, External right and left ear normal. Oropharynx is clear and moist EYES: Conjunctivae and EOM are normal.  NECK: Normal range of motion, supple, no masses. SKIN: Skin is warm and dry. No rash noted. Not diaphoretic. No erythema. No pallor. [place]: Alert and oriented to person, place, and time. Normal reflexes, muscle tone coordination. No cranial nerve deficit noted. PSYCHIATRIC: Normal mood and affect. Normal behavior. Normal judgment and thought content. CARDIOVASCULAR: Normal heart rate noted, regular rhythm RESPIRATORY: Effort and breath sounds normal, no problems with respiration noted MUSCULOSKELETAL: Normal range of motion. No edema and no tenderness. ABDOMEN: Soft, nontender, nondistended, gravid. CERVIX: deferred  Fetal monitoring: A: FHR: 140s bpm, Variability: moderate, Accelerations: Present, Decelerations: Absent  B: baseline 130s, variability : marked, accels: present, decels absent Uterine activity:  mild irritability  Results for orders placed or performed during the hospital encounter of 11/29/20 (from the past 48 hour(s))  Urinalysis, Routine w reflex microscopic Urine, Clean Catch     Status: Abnormal   Collection Time: 11/29/20  8:30 PM  Result Value Ref Range   Color, Urine STRAW (A) YELLOW   APPearance CLEAR CLEAR   Specific Gravity, Urine 1.004 (L) 1.005 - 1.030   pH 7.0 5.0 - 8.0   Glucose, UA NEGATIVE NEGATIVE mg/dL   Hgb urine dipstick SMALL (A) NEGATIVE   Bilirubin Urine NEGATIVE NEGATIVE   Ketones, ur 20 (A) NEGATIVE mg/dL   Protein, ur NEGATIVE NEGATIVE mg/dL   Nitrite NEGATIVE NEGATIVE   Leukocytes,Ua NEGATIVE NEGATIVE   RBC / HPF 0-5 0 - 5 RBC/hpf   WBC, UA 0-5 0 - 5 WBC/hpf   Bacteria, UA RARE (A) NONE SEEN   Squamous Epithelial / LPF 0-5 0 - 5    Comment: Performed at [facility], [address] [place] [address]  CBC with Differential/Platelet     Status: Abnormal   Collection Time: 11/29/20  9:02 PM  Result Value Ref Range   WBC 5.6 4.0 - 10.5 K/uL   RBC 3.55 (L) 3.87 - 5.11 MIL/uL   Hemoglobin 9.6 (L) 12.0 - 15.0 g/dL   HCT 27.7 (L) 36.0 - 46.0 %   MCV 78.0 (L) 80.0 - 100.0 fL   MCH 27.0 26.0 - 34.0 pg   MCHC 34.7 30.0 - 36.0 g/dL   RDW 14.7 11.5 - 15.5 %   Platelets 216 150 - 400 K/uL   nRBC 0.0 0.0 - 0.2 %   Neutrophils Relative % 77 %   Neutro Abs 4.4 1.7 -  7.7 K/uL   Lymphocytes Relative 7 %   Lymphs Abs 0.4 (L) 0.7 - 4.0 K/uL   Monocytes Relative 13 %   Monocytes Absolute 0.7 0.1 - 1.0 K/uL   Eosinophils Relative 1 %   Eosinophils Absolute 0.1 0.0 - 0.5 K/uL   Basophils Relative 1 %   Basophils Absolute 0.0 0.0 - 0.1 K/uL   Immature Granulocytes 1 %   Abs Immature Granulocytes 0.03 0.00 - 0.07 K/uL    Comment: Performed at [facility] [address] [place], [place] [address]  Comprehensive metabolic panel     Status: Abnormal   Collection Time: 11/29/20  9:02 PM  Result Value Ref Range   Sodium 131 (L) 135 -  145 mmol/L   Potassium 3.8 3.5 - 5.1 mmol/L   Chloride 100 98 - 111 mmol/L   CO2 18 (L) 22 - 32 mmol/L   Glucose, Bld 92 70 - 99 mg/dL    Comment: Glucose reference range applies only to samples taken after fasting for at least 8 hours.   BUN <5 (L) 6 - 20 mg/dL   Creatinine, Ser 0.45 0.44 - 1.00 mg/dL   Calcium 9.0 8.9 - 10.3 mg/dL   Total Protein 6.2 (L) 6.5 - 8.1 g/dL   Albumin 2.6 (L) 3.5 - 5.0 g/dL   AST 77 (H) 15 - 41 U/L   ALT 41 0 - 44 U/L   Alkaline Phosphatase 152 (H) 38 - 126 U/L   Total Bilirubin 0.6 0.3 - 1.2 mg/dL   GFR, Estimated >60 >60 mL/min    Comment: (NOTE) Calculated using the CKD-EPI Creatinine Equation (2021)    Anion gap 13 5 - 15    Comment: Performed at [facility], [place] [address], [place], [place] [address]  SARS CORONAVIRUS 2 (TAT 6-24 HRS) Nasopharyngeal Nasopharyngeal Swab     Status: Abnormal   Collection Time: 11/29/20  9:03 PM   Specimen: Nasopharyngeal Swab  Result Value Ref Range   SARS Coronavirus 2 POSITIVE (A) NEGATIVE    Comment: (NOTE) SARS-CoV-2 target nucleic acids are DETECTED.  The SARS-CoV-2 RNA is generally detectable in upper and lower respiratory specimens during the acute phase of infection. Positive results are indicative of the presence of SARS-CoV-2 RNA. Clinical correlation with patient history and other diagnostic information is  necessary to determine patient infection status. Positive results do not rule out bacterial infection or co-infection with other viruses.  The expected result is Negative.  Fact Sheet for Patients: SugarRoll.be  Fact Sheet for Healthcare Providers: https://www.woods-mathews.com/  This test is not yet approved or cleared by the [place] FDA and  has been authorized for detection and/or diagnosis of SARS-CoV-2 by FDA under an Emergency Use Authorization (EUA). This EUA will remain  in effect (meaning this test can be used) for the duration of  the COVID-19 declaration under Section 564(b)(1) of the Act, 21 U. S.C. section 360bbb-3(b)(1), unless the authorization is terminated or revoked sooner.   Performed at [facility], [place] [address] [place], [place] [address]   Type and screen [facility]     Status: None   Collection Time: 11/29/20  9:15 PM  Result Value Ref Range   ABO/RH(D) O POS    Antibody Screen NEG    Sample Expiration      12/02/2020,2359 Performed at [facility], [place] [address], [place], [place] [address]   Glucose, capillary     Status: Abnormal  Collection Time: 11/30/20  6:19 AM  Result Value Ref Range   Glucose-Capillary 125 (H) 70 - 99 mg/dL    Comment: Glucose reference range applies only to samples taken after fasting for at least 8 hours.  Glucose, capillary     Status: Abnormal   Collection Time: 11/30/20  9:57 AM  Result Value Ref Range   Glucose-Capillary 201 (H) 70 - 99 mg/dL    Comment: Glucose reference range applies only to samples taken after fasting for at least 8 hours.  Glucose, capillary     Status: Abnormal   Collection Time: 11/30/20  3:55 PM  Result Value Ref Range   Glucose-Capillary 182 (H) 70 - 99 mg/dL    Comment: Glucose reference range applies only to samples taken after fasting for at least 8 hours.  Glucose, capillary     Status: Abnormal   Collection Time: 11/30/20  9:04 PM  Result Value Ref Range   Glucose-Capillary 182 (H) 70 - 99 mg/dL    Comment: Glucose reference range applies only to samples taken after fasting for at least 8 hours.  Glucose, capillary     Status: Abnormal   Collection Time: 12/01/20  5:46 AM  Result Value Ref Range   Glucose-Capillary 158 (H) 70 - 99 mg/dL    Comment: Glucose reference range applies only to samples taken after fasting for at least 8 hours.  Glucose, capillary     Status: Abnormal   Collection Time: 12/01/20 10:53 AM  Result Value Ref Range   Glucose-Capillary 197 (H) 70 - 99 mg/dL    Comment:  Glucose reference range applies only to samples taken after fasting for at least 8 hours.    I have reviewed the patient's current medications.  ASSESSMENT: Active Problems:   Pre-existing diabetes mellitus affecting pregnancy, antepartum   Preterm uterine contractions in third trimester, antepartum   PLAN: Patient is s/p magnesium sulfate and 2 doses BMZ.  Minimal uterine activity currently noted. Elevated blood sugars noted, pt now on carb control diet and will get sliding scale for coverage. Appreciate diabetic coordinator assistance. Pt largely asymptomatic from covid, but it is recommend she receive remdesivir, she will start her 3 day  course today     Continue routine antenatal care.   Lynnda Shields, MD [place] Attending, Center for [facility] 12/01/2020 2:51 PM

## 2020-12-01 NOTE — Progress Notes (Signed)
Subjective: CTSP patient regarding a new lower abdominal pain.  Pt seen and she noted she had new swelling in her genital region and pelvic pressure.  The swelling appears to have resolved on its own.  Pt's spouse was present and he noted there had been edema previously, but now the genitalia was back to normal and was different in appearance.    Pt noted lower pelvic pressure which was slightly uncomfortable, but also noted that it was not much different from her baseline at home.  She does not note a reoccurrence of organized contractions.  Objective: Vital signs in last 24 hours: Temp:  [97.6 F (36.4 C)-98.3 F (36.8 C)] 97.8 F (36.6 C) (02/24 1544) Pulse Rate:  [83-106] 86 (02/24 1544) Resp:  [16-19] 18 (02/24 1544) BP: (97-117)/(53-70) 97/53 (02/24 1544) SpO2:  [98 %-100 %] 99 % (02/24 1544) Weight change:   Intake/Output from previous day: 02/23 0701 - 02/24 0700 In: 1021.1 [P.O.:3528; I.V.:3139.3] Out: 6200 [Urine:6200] Intake/Output this shift: Total I/O In: -  Out: 1400 [Urine:1400]  Ext genitalia:  As described above  Lab Results: Recent Labs    11/29/20 2102  WBC 5.6  HGB 9.6*  HCT 27.7*  PLT 216   BMET:  Recent Labs    11/29/20 2102  NA 131*  K 3.8  CL 100  CO2 18*  GLUCOSE 92  BUN <5*  CREATININE 0.45  CALCIUM 9.0    I have reviewed the patient's current medications.  Assessment/Plan: Preterm labor, twin gestation No current preterm labor Procardia 10 mg po q 6 hours added to med list for tocolysis if needed. Reassess in AM    LOS: 0 days   Griffin Basil 12/01/2020,5:17 PM

## 2020-12-01 NOTE — Progress Notes (Signed)
COVID Infusion Center Note:  Patient on screening list for outpatient management -   Tiffany Hall is a 35 y.o. female currently pregnant with twins complicated by obesity and diabetes now admitted for antepartum management. COVID + with mild symptoms early in disease. She has several risk factors for more severe disease progression and I don't see where she has received a booster vaccine.   Would recommend consideration of 3-day course of IV remdesivir for her vs 5-day course of Paxlovid PO to be started inpatient if MFM / OB team agrees.   Please send a secure chat to Remdesivir OP Infusion Group to help with outpatient IV Remdesivir continuation if this is started inpatient but not completed before discharge.    Janene Madeira, MSN, NP-C [facility] for Infectious Disease [email or web address] Pager: [phone] Office: [phone] [place]: [phone]

## 2020-12-01 NOTE — Progress Notes (Signed)
Inpatient Diabetes Program Recommendations  Diabetes Treatment Program Recommendations  ADA Standards of Care 2018 Diabetes in Pregnancy Target Glucose Ranges:  Fasting: 60 - 90 mg/dL Preprandial: 60 - 105 mg/dL 1 hr postprandial: Less than 140mg /dL (from first bite of meal) 2 hr postprandial: Less than 120 mg/dL (from first bite of meal)    Lab Results  Component Value Date   GLUCAP 158 (H) 12/01/2020   HGBA1C 8.0 (H) 07/13/2020    Review of Glycemic Control Results for Tiffany Hall, Tiffany Hall (MRN [id number]) as of 12/01/2020 10:11  Ref. Range 11/30/2020 09:57 11/30/2020 15:55 11/30/2020 21:04 12/01/2020 05:46  Glucose-Capillary Latest Ref Range: 70 - 99 mg/dL 201 (H) 182 (H) 182 (H) 158 (H)   Diabetes history: Type 2 DM Outpatient Diabetes medications: Metformin 500 mg BID (NT) Current orders for Inpatient glycemic control: none BMZ x2  Inpatient Diabetes Program Recommendations:    Glucose trends increased in the setting of steroids.   Consider: -changing diet to carb modified -Adding Novolog 0-14 units TID (under diabetes treatment for pregnancy order set)  Thanks, Tiffany Curb, MSN, RNC-OB Diabetes Coordinator [phone] (8a-5p)

## 2020-12-01 NOTE — Progress Notes (Addendum)
phone consult in regards Tiffany Hall a 36 year old female currently pregnant with twins. Pregnancy has been complicated by obesity and gestational diabetes.  She presented with complaints of decreased fetal movement.  Patient was found to be febrile up to 101.4 degrees in height with headache and reports of mild intermittent cough.  Patient had received her COVID vaccinations, but was noted to be positive on inpatient screening.  As patient not having significant symptoms and able to maintain O2 saturations on room air would recommend the 3-day course of remdesivir.  If patient discharged orders for ambulatory referral for COVID-19 infusion can be used to have patient set up to receive her additional doses remdesivir needed.

## 2020-12-02 DIAGNOSIS — O24313 Unspecified pre-existing diabetes mellitus in pregnancy, third trimester: Secondary | ICD-10-CM | POA: Diagnosis not present

## 2020-12-02 DIAGNOSIS — E119 Type 2 diabetes mellitus without complications: Secondary | ICD-10-CM | POA: Diagnosis not present

## 2020-12-02 DIAGNOSIS — O30043 Twin pregnancy, dichorionic/diamniotic, third trimester: Secondary | ICD-10-CM

## 2020-12-02 DIAGNOSIS — O98513 Other viral diseases complicating pregnancy, third trimester: Secondary | ICD-10-CM | POA: Diagnosis not present

## 2020-12-02 LAB — GLUCOSE, CAPILLARY
Glucose-Capillary: 107 mg/dL — ABNORMAL HIGH (ref 70–99)
Glucose-Capillary: 120 mg/dL — ABNORMAL HIGH (ref 70–99)
Glucose-Capillary: 92 mg/dL (ref 70–99)
Glucose-Capillary: 98 mg/dL (ref 70–99)

## 2020-12-02 LAB — BASIC METABOLIC PANEL
Anion gap: 12 (ref 5–15)
BUN: 6 mg/dL (ref 6–20)
CO2: 20 mmol/L — ABNORMAL LOW (ref 22–32)
Calcium: 8.8 mg/dL — ABNORMAL LOW (ref 8.9–10.3)
Chloride: 104 mmol/L (ref 98–111)
Creatinine, Ser: 0.51 mg/dL (ref 0.44–1.00)
GFR, Estimated: >60 mL/min (ref >60)
Glucose, Bld: 111 mg/dL — ABNORMAL HIGH (ref 70–99)
Potassium: 3.7 mmol/L (ref 3.5–5.1)
Sodium: 136 mmol/L (ref 135–145)

## 2020-12-02 MED ORDER — NIFEDIPINE 10 MG PO CAPS: 10.0000 mg | ORAL_CAPSULE | Freq: Four times a day (QID) | ORAL | 0 refills | Status: DC | PRN

## 2020-12-02 NOTE — Discharge Instructions (Addendum)
[employer] ([place]).Twin and Triplet Pregnancy. [place]: [employer] for [place] ([place]); 2019.">  Multiple Pregnancy Multiple pregnancy means that a woman is carrying more than one baby at a time. She may be pregnant with twins, triplets, or more. The majority of multiple pregnancies are twins. Naturally conceiving triplets or more (higher-order multiples) is rare. Multiple pregnancies are riskier than single pregnancies. A woman with a multiple pregnancy is more likely to have certain problems during her pregnancy. How does a multiple pregnancy happen? A multiple pregnancy happens when:  The woman's body releases more than one egg at a time, and then each egg gets fertilized by a different sperm. ? This is the most common type of multiple pregnancy. ? Twins or other multiples produced this way are called fraternal. They are no more alike than non-multiple siblings are.  One sperm fertilizes one egg, which then divides into more than one embryo. ? Twins or other multiples produced this way are called identical. Identical multiples are always the same gender, and they look very much alike.   Who is most likely to have a multiple pregnancy? A multiple pregnancy is more likely to develop in women who:  Have had fertility treatment, especially if the treatment included fertility medicines.  Are older than 35 years of age.  Have already had four or more children.  Have a family history of multiple pregnancy. How is a multiple pregnancy diagnosed? A multiple pregnancy may be diagnosed based on:  Symptoms such as: ? Rapid weight gain in the first 3 months of pregnancy (first trimester). ? More severe nausea and breast tenderness than what is typical of a single pregnancy. ? A larger uterus than what is normal for the stage of the pregnancy.  Blood tests that detect a higher-than-normal level of human chorionic gonadotropin (hCG). This is a hormone that your  body produces in early pregnancy.  An ultrasound exam. This is used to confirm that you are carrying multiples. What risks come with multiple pregnancy? A multiple pregnancy puts you at a higher risk for certain problems during or after your pregnancy. These include:  Delivering your babies before your due date (preterm birth). A full-term pregnancy lasts for at least 37 weeks. ? Babies born before 63 weeks may have a higher risk for breathing problems, feeding difficulties, cerebral palsy, and learning disabilities.  Diabetes.  Preeclampsia. This is a serious condition that causes high blood pressure and headaches during pregnancy.  Too much blood loss after childbirth (postpartum hemorrhage).  Postpartum depression.  Low birth weight of the babies. How will having a multiple pregnancy affect my care? Your health care team will monitor you more closely. You may need more frequent prenatal visits. This will ensure that you are healthy and that your babies are growing normally. Follow these instructions at home: Eating and drinking  Increase your nutrition. ? Follow your health care provider's recommendations for weight gain. You may need to gain a little extra weight when you are pregnant with multiples. ? Eat healthy snacks often throughout the day. This will add calories and reduce nausea.  Drink enough fluid to keep your urine pale yellow.  Take prenatal vitamins. Ask your health care provider what vitamins are right for you. Activity Limit your activities by 20-24 weeks of pregnancy.  Rest often.  Avoid activities, exercise, and work that take a lot of effort.  Ask your health care provider when you should stop having sex. General instructions  Do not  use any products that contain nicotine or tobacco, such as cigarettes, e-cigarettes, and chewing tobacco. If you need help quitting, ask your health care provider.  Do not drink alcohol or use illegal drugs.  Take  over-the-counter and prescription medicines only as told by your health care provider.  Arrange for extra help around the house.  Keep all follow-up visits and all prenatal visits as told by your health care provider. This is important. Where to find more information  [employer]: www.acog.org Contact a health care provider if:  You have dizziness.  You have nausea, vomiting, or diarrhea that does not go away.  You have depression or other emotions that are interfering with your normal activities.  You have a fever.  You have pain with urination.  You have a bad-smelling vaginal discharge.  You notice increased swelling in your face, hands, legs, or ankles. Get help right away if:  You have fluid leaking from your vagina.  You have bleeding from your vagina.  You have pelvic cramps, pelvic pressure, or nagging pain in your abdomen or lower back.  You are having regular contractions.  You have a severe headache, with or without changes in how you see.  You have chest pain or shortness of breath.  You notice that your babies move less often, or do not move at all. Summary  Having a multiple pregnancy means that a woman is carrying more than one baby at a time.  A multiple pregnancy puts you at a higher risk for delivering your babies before your due date, having diabetes, preeclampsia, too much blood loss after childbirth, or low birth weight of the babies.  Your health care provider will monitor you more closely during your pregnancy.  You may need to make some lifestyle changes during pregnancy. This includes eating more, limiting your activities after 20-24 weeks of pregnancy, and arranging for extra help around the house.  Follow up with your health care provider as instructed if you experience any complications. This information is not intended to replace advice given to you by your health care provider. Make sure you discuss  any questions you have with your health care provider. Document Revised: 05/18/2019 Document Reviewed: 05/18/2019 Elsevier Patient Education  2021 [place]. Preterm Labor Pregnancy normally lasts 39-41 weeks. Preterm labor is when labor starts before you have been pregnant for 37 weeks. Babies who are born too early may have problems with blood sugar, body temperature, heart, and breathing. These problems may be very serious in babies who are born before 36 weeks of pregnancy. What are the causes? The cause of this condition is not known. What increases the risk? You are more likely to have preterm labor if:  You have medical problems, now or in the past.  You have problems now or in your past pregnancies.  You have lifestyle problems. Medical history  You have problems of the womb (uterus).  You have an infection, including infections you get from sex.  You have problems that do not go away, such as: ? Blood clots. ? High blood pressure. ? High blood sugar.  You have low body weight or too much body weight. Present and past pregnancies  You have had preterm labor before.  You are pregnant with two babies or more.  You have a condition in which the placenta covers your cervix.  You waited less than 6 months between giving birth and becoming pregnant again.  Your unborn baby has some  problems.  You have bleeding from your vagina.  You became pregnant by a method called IVF. Lifestyle  You smoke.  You drink alcohol.  You use drugs.  You have stress.  You have abuse in your home.  You come in contact with chemicals that harm the body (pollutants). Other factors  You are younger than 17 years or older than 35 years. What are the signs or symptoms? Symptoms of this condition include:  Cramps. The cramps may feel like cramps from a period.  You may have watery poop (diarrhea).  Pain in the belly (abdomen).  Pain in the lower back.  Regular  contractions. It may feel like your belly is getting tighter.  Pressure in the lower belly.  More fluid leaking from the vagina. The fluid may be watery or bloody.  Water breaking. How is this treated? Treatment for this condition depends on your health, the health of your baby, and how old your pregnancy is. It may include:  Taking medicines, such as: ? Hormone medicines. ? Medicines to stop contractions. ? Medicines to help mature the baby's lungs. ? Medicines to prevent your baby from getting cerebral palsy.  Bed rest. If the labor happens before 34 weeks of pregnancy, you may need to stay in the hospital.  Delivering the baby. Follow these instructions at home:  Do not use any products that contain nicotine or tobacco, such as cigarettes, e-cigarettes, and chewing tobacco. If you need help quitting, ask your doctor.  Do not drink alcohol.  Take over-the-counter and prescription medicines only as told by your doctor.  Rest as told by your doctor.  Return to your activities as told by your doctor. Ask your doctor what activities are safe for you.  Keep all follow-up visits as told by your doctor. This is important.   How is this prevented? To have a healthy pregnancy:  Do not use street drugs.  Do not use any medicines unless you ask your doctor if they are safe for you.  Talk with your doctor before taking any herbal supplements.  Make sure you gain enough weight.  Watch for infection. If you think you might have an infection, get it checked right away. Symptoms of infection may include: ? Fever. ? Vaginal discharge. ? Pain or burning when you pee. ? Needing to pee urgently. ? Needing to pee often. ? Peeing small amounts often. ? Blood in your pee. ? Pee that smells bad or unusual.  Tell your doctor if you have gone into preterm labor before. Contact a doctor if:  You think you are going into preterm labor.  You have symptoms of preterm labor.  You have  symptoms of infection. Get help right away if:  You are having painful contractions every 5 minutes or less.  Your water breaks. Summary  Preterm labor is labor that starts before you reach 37 weeks of pregnancy.  Your baby may have problems if delivered early.  The cause of preterm labor is not known. Having problems of the womb (uterus), an infection, or bleeding during pregnancy increases the risk.  Contact a doctor if you have signs or symptoms of preterm labor. This information is not intended to replace advice given to you by your health care provider. Make sure you discuss any questions you have with your health care provider. Document Revised: 10/27/2019 Document Reviewed: 10/27/2019 Elsevier Patient Education  2021 [place] are scheduled for an outpatient Remdesivir infusion at  1:30pm on Saturday 2/26 at [facility]. Please park at [place], as staff will be escorting you through the [place] entrance of the hospital. Appointments take approximately 45 minutes.    The address for the infusion clinic site is:  --GPS address is [place] - the parking is located near [employer] building where you will see  COVID19 Infusion feather banner marking the entrance to parking.   (see photos below)            --Enter into the 2nd entrance where the "wave, flag banner" is at the road. Turn into this 2nd entrance and immediately turn left to park in 1 of the 10 parking spots.   --Please stay in your car and call the desk for assistance inside [phone].   The day of your visit you should:  Get plenty of rest the night before and drink plenty of water  Eat a light meal/snack before coming and take your medications as prescribed   Wear warm, comfortable clothes with a shirt that can roll-up over the elbow (will need IV start).   Wear a mask   Consider bringing some activity to help pass the time

## 2020-12-02 NOTE — Discharge Summary (Signed)
Physician Discharge Summary  Patient ID: Tiffany Hall MRN: [id number] DOB/AGE: 36/08/1986 36 y.o.  Admit date: 11/29/2020 Discharge date: 12/02/2020  Admission Diagnoses: Preterm labor, Di/Di twin pregnancy, COVID+, Gestational DM  Discharge Diagnoses:  Active Problems:   Pre-existing diabetes mellitus affecting pregnancy, antepartum   Preterm uterine contractions in third trimester, antepartum   Discharged Condition: stable  Hospital Course: 63AG [id number]@ 36w1dadmitted for preterm labor.  Pt was treated with betamethasone and Magnesium for tocolysis.  Due to COVID+ and multiple increased risk factors, pt was treated with Remdesivir. No further cervical change was noted, pt discharged home in stable condition with plans for close oupatient management.  Consults: None  Significant Diagnostic Studies: labs: COVID +  Treatments: IV hydration, steroids: betamethasone and Magnesium sulfate, remdesivir  Discharge Exam: Blood pressure 103/60, pulse 82, temperature 98.8 F (37.1 C), temperature source Oral, resp. rate 18, height 5' 4"  (1.626 m), weight 92.4 kg, last menstrual period 04/21/2020, SpO2 100 %. General appearance: alert, cooperative and no distress GI: soft, non-tender; bowel sounds normal; no masses,  no organomegaly Pelvic: external genitalia normal and SVE: 1/50/-3- unchanged Extremities: extremities normal, no edema Skin: Skin color, texture, turgor normal. No rashes or lesions  Disposition:  Discharge disposition: 01-Home or Self Care       Discharge Instructions    Discharge activity:  No Restrictions   Complete by: As directed    Discharge diet:   Complete by: As directed    Continue with your diabetic diet   Do not have sex or do anything that might make you have an orgasm   Complete by: As directed    Notify physician for a general feeling that "something is not right"   Complete by: As directed    Notify physician for increase or change in vaginal  discharge   Complete by: As directed    Notify physician for intestinal cramps, with or without diarrhea, sometimes described as "gas pain"   Complete by: As directed    Notify physician for leaking of fluid   Complete by: As directed    Notify physician for low, dull backache, unrelieved by heat or Tylenol   Complete by: As directed    Notify physician for menstrual like cramps   Complete by: As directed    Notify physician for pelvic pressure   Complete by: As directed    Notify physician for uterine contractions.  These may be painless and feel like the uterus is tightening or the baby is  "balling up"   Complete by: As directed    Notify physician for vaginal bleeding   Complete by: As directed    PRETERM LABOR:  Includes any of the follwing symptoms that occur between 20 - [redacted] weeks gestation.  If these symptoms are not stopped, preterm labor can result in preterm delivery, placing your baby at risk   Complete by: As directed      Allergies as of 12/02/2020      Reactions   Amoxicillin Swelling   Did it involve swelling of the face/tongue/throat, SOB, or low BP? No Did it involve sudden or severe rash/hives, skin peeling, or any reaction on the inside of your mouth or nose? No Did you need to seek medical attention at a hospital or doctor's office? No When did it last happen?last year If all above answers are "NO", may proceed with cephalosporin use.   Nubain [nalbuphine Hcl] Swelling, Palpitations, Other (See Comments)   Pt states that she is  able to take Percocet without any problems.        Medication List    STOP taking these medications   metFORMIN 500 MG tablet Commonly known as: Glucophage     TAKE these medications   Accu-Chek Guide test strip Generic drug: glucose blood Use to check blood sugars four times a day was instructed   Accu-Chek Guide w/Device Kit 1 Device by Does not apply route 4 (four) times daily.   Accu-Chek Softclix Lancets lancets Use  as instructed   acetaminophen 500 MG tablet Commonly known as: TYLENOL Take 1,000 mg by mouth every 6 (six) hours as needed for mild pain or headache.   aspirin EC 81 MG tablet Take 1 tablet (81 mg total) by mouth daily. Take after 12 weeks for prevention of preeclampsia later in pregnancy   Blood Pressure Kit Devi 1 kit by Does not apply route once a week. Check Blood Pressure regularly and record readings into the Babyscripts App.  Large Cuff.  DX O90.0   docusate sodium 100 MG capsule Commonly known as: COLACE Take 100 mg by mouth 2 (two) times daily.   ferrous sulfate 325 (65 FE) MG tablet Take 1 tablet (325 mg total) by mouth 2 (two) times daily with a meal.   folic acid 1 MG tablet Commonly known as: FOLVITE Take 1 tablet (1 mg total) by mouth daily.   NIFEdipine 10 MG capsule Commonly known as: PROCARDIA Take 1 capsule (10 mg total) by mouth every 6 (six) hours as needed (contractions).   prenatal multivitamin Tabs tablet Take 1 tablet by mouth daily at 12 noon.   ROBITUSSIN DM PO Take 2 capsules by mouth daily as needed (For cold symptoms.).       Follow-up Information    [facility]. Schedule an appointment as soon as possible for a visit in 1 week(s).   Specialty: Obstetrics and Gynecology Contact information: [address], [place] [phone]             Pt will receive final remdesivir as an outpatient. Signed: Griffin Basil 12/02/2020, 12:30 PM

## 2020-12-02 NOTE — Progress Notes (Signed)
Pt given discharge instructions and all questions answered. IV discontinued.

## 2020-12-02 NOTE — Progress Notes (Signed)
Patient scheduled for outpatient Remdesivir infusion at 1:30pm on Saturday 2/26 at [facility]. Please inform the patient to park at [place], as staff will be escorting the patient through the [place] entrance of the hospital. Appointments take approximately 45 minutes.    There is a wave flag banner located near the entrance on [address]. Turn into this entrance and immediately turn left or right and park in 1 of the 10 designated Covid Infusion Parking spots. There is a phone number on the sign, please call and let the staff know what spot you are in and we will come out and get you. For questions call [phone].  Thanks.

## 2020-12-02 NOTE — Progress Notes (Signed)
Inpatient Diabetes Program Recommendations  Diabetes Treatment Program Recommendations  ADA Standards of Care 2018 Diabetes in Pregnancy Target Glucose Ranges:  Fasting: 60 - 90 mg/dL Preprandial: 60 - 105 mg/dL 1 hr postprandial: Less than 140mg /dL (from first bite of meal) 2 hr postprandial: Less than 120 mg/dL (from first bite of meal)    Lab Results  Component Value Date   GLUCAP 107 (H) 12/02/2020   HGBA1C 8.0 (H) 07/13/2020    Review of Glycemic Control Results for TRULEE, HAMSTRA (MRN [id number]) as of 12/02/2020 07:57  Ref. Range 12/01/2020 16:16 12/01/2020 20:10 12/02/2020 00:12 12/02/2020 04:14  Glucose-Capillary Latest Ref Range: 70 - 99 mg/dL 176 (H) 130 (H) 120 (H) 107 (H)   Diabetes history: Type 2 DM Outpatient Diabetes medications: Metformin 500 mg BID (NT) Current orders for Inpatient glycemic control: Novolog 0-16 units Q4H BMZ x2  Inpatient Diabetes Program Recommendations:    Consider changing sliding scale to Novolog 0-16 units TID & HS if diet to continue.   Thanks, Bronson Curb, MSN, RNC-OB Diabetes Coordinator [phone] (8a-5p)

## 2020-12-03 ENCOUNTER — Ambulatory Visit (HOSPITAL_COMMUNITY)
Admit: 2020-12-03 | Discharge: 2020-12-03 | Disposition: A | Discharge status: Home / Self Care | Payer: Medicare Other | Attending: Pulmonary Disease | Admitting: Pulmonary Disease

## 2020-12-03 DIAGNOSIS — U071 COVID-19: Secondary | ICD-10-CM | POA: Diagnosis not present

## 2020-12-03 DIAGNOSIS — J1289 Other viral pneumonia: Secondary | ICD-10-CM | POA: Diagnosis not present

## 2020-12-03 MED ORDER — ALBUTEROL SULFATE HFA 108 (90 BASE) MCG/ACT IN AERS: 2.0000 | INHALATION_SPRAY | Freq: Once | RESPIRATORY_TRACT | Status: DC | PRN

## 2020-12-03 MED ORDER — DIPHENHYDRAMINE HCL 50 MG/ML IJ SOLN: 50.0000 mg | Freq: Once | INTRAMUSCULAR | Status: DC | PRN

## 2020-12-03 MED ORDER — EPINEPHRINE 0.3 MG/0.3ML IJ SOAJ: 0.3000 mg | Freq: Once | INTRAMUSCULAR | Status: DC | PRN

## 2020-12-03 MED ORDER — METHYLPREDNISOLONE SODIUM SUCC 125 MG IJ SOLR: 125.0000 mg | Freq: Once | INTRAMUSCULAR | Status: DC | PRN

## 2020-12-03 MED ORDER — SODIUM CHLORIDE 0.9 % IV SOLN
100.0000 mg | Freq: Once | INTRAVENOUS | Status: AC
  Administered 2020-12-03: 100 mg via INTRAVENOUS

## 2020-12-03 MED ORDER — SODIUM CHLORIDE 0.9 % IV SOLN: INTRAVENOUS | Status: DC | PRN

## 2020-12-03 MED ORDER — FAMOTIDINE IN NACL 20-0.9 MG/50ML-% IV SOLN: 20.0000 mg | Freq: Once | INTRAVENOUS | Status: DC | PRN

## 2020-12-03 NOTE — Discharge Instructions (Signed)
10 Things You Can Do to Manage Your COVID-19 Symptoms at Home °If you have possible or confirmed COVID-19: °1. Stay home except to get medical care. °2. Monitor your symptoms carefully. If your symptoms get worse, call your healthcare provider immediately. °3. Get rest and stay hydrated. °4. If you have a medical appointment, call the healthcare provider ahead of time and tell them that you have or may have COVID-19. °5. For medical emergencies, call 911 and notify the dispatch personnel that you have or may have COVID-19. °6. Cover your cough and sneezes with a tissue or use the inside of your elbow. °7. Wash your hands often with soap and water for at least 20 seconds or clean your hands with an alcohol-based hand sanitizer that contains at least 60% alcohol. °8. As much as possible, stay in a specific room and away from other people in your home. Also, you should use a separate bathroom, if available. If you need to be around other people in or outside of the home, wear a mask. °9. Avoid sharing personal items with other people in your household, like dishes, towels, and bedding. °10. Clean all surfaces that are touched often, like counters, tabletops, and doorknobs. Use household cleaning sprays or wipes according to the label instructions. °cdc.gov/coronavirus °04/22/2020 °This information is not intended to replace advice given to you by your health care provider. Make sure you discuss any questions you have with your health care provider. °Document Revised: 08/08/2020 Document Reviewed: 08/08/2020 °Elsevier Patient Education © 2021 Elsevier Inc. °If you have any questions or concerns after the infusion please call the Advanced Practice Provider on call at [phone]. This number is ONLY intended for your use regarding questions or concerns about the infusion post-treatment side-effects.  Please do not provide this number to others for use. For return to work notes please contact your primary care provider.   ° °If someone you know is interested in receiving treatment please have them contact their MD for a referral or visit [email or web address] ° ° ° °

## 2020-12-03 NOTE — Progress Notes (Signed)
  Diagnosis: COVID-19  Physician: Dr. Asencion Noble  Procedure: Covid Infusion Clinic Med: remdesivir infusion - Provided patient with remdesivir fact sheet for patients, parents and caregivers prior to infusion.  Complications: No immediate complications noted.  Discharge: Discharged home   Janine Ores 12/03/2020

## 2020-12-03 NOTE — Progress Notes (Signed)
Patient reviewed Fact Sheet for Patients, Parents, and Caregivers for Emergency Use Authorization (EUA) of Remdesivir for the Treatment of Coronavirus. Patient also reviewed and is agreeable to the estimated cost of treatment. Patient is agreeable to proceed.

## 2020-12-06 ENCOUNTER — Other Ambulatory Visit: Payer: Self-pay

## 2020-12-06 ENCOUNTER — Ambulatory Visit: Payer: Medicare Other | Admitting: *Deleted

## 2020-12-06 ENCOUNTER — Inpatient Hospital Stay (HOSPITAL_COMMUNITY)
Admission: AD | Admit: 2020-12-06 | Discharge: 2020-12-06 | Disposition: A | Discharge status: Home / Self Care | Payer: Medicare Other | Attending: Obstetrics & Gynecology | Admitting: Obstetrics & Gynecology

## 2020-12-06 ENCOUNTER — Encounter: Payer: Self-pay | Admitting: *Deleted

## 2020-12-06 ENCOUNTER — Encounter (HOSPITAL_COMMUNITY): Payer: Self-pay | Admitting: Obstetrics & Gynecology

## 2020-12-06 ENCOUNTER — Ambulatory Visit (HOSPITAL_BASED_OUTPATIENT_CLINIC_OR_DEPARTMENT_OTHER): Payer: Medicare Other

## 2020-12-06 DIAGNOSIS — O403XX2 Polyhydramnios, third trimester, fetus 2: Secondary | ICD-10-CM

## 2020-12-06 DIAGNOSIS — Z3A32 32 weeks gestation of pregnancy: Secondary | ICD-10-CM

## 2020-12-06 DIAGNOSIS — Z7984 Long term (current) use of oral hypoglycemic drugs: Secondary | ICD-10-CM | POA: Insufficient documentation

## 2020-12-06 DIAGNOSIS — O09293 Supervision of pregnancy with other poor reproductive or obstetric history, third trimester: Secondary | ICD-10-CM | POA: Diagnosis not present

## 2020-12-06 DIAGNOSIS — Z7982 Long term (current) use of aspirin: Secondary | ICD-10-CM | POA: Insufficient documentation

## 2020-12-06 DIAGNOSIS — O4703 False labor before 37 completed weeks of gestation, third trimester: Secondary | ICD-10-CM | POA: Diagnosis not present

## 2020-12-06 DIAGNOSIS — Z041 Encounter for examination and observation following transport accident: Secondary | ICD-10-CM | POA: Diagnosis not present

## 2020-12-06 DIAGNOSIS — E669 Obesity, unspecified: Secondary | ICD-10-CM

## 2020-12-06 DIAGNOSIS — O0993 Supervision of high risk pregnancy, unspecified, third trimester: Secondary | ICD-10-CM

## 2020-12-06 DIAGNOSIS — Z87891 Personal history of nicotine dependence: Secondary | ICD-10-CM | POA: Diagnosis not present

## 2020-12-06 DIAGNOSIS — Y929 Unspecified place or not applicable: Secondary | ICD-10-CM | POA: Insufficient documentation

## 2020-12-06 DIAGNOSIS — Y939 Activity, unspecified: Secondary | ICD-10-CM | POA: Insufficient documentation

## 2020-12-06 DIAGNOSIS — O99213 Obesity complicating pregnancy, third trimester: Secondary | ICD-10-CM

## 2020-12-06 DIAGNOSIS — O26893 Other specified pregnancy related conditions, third trimester: Secondary | ICD-10-CM | POA: Diagnosis present

## 2020-12-06 DIAGNOSIS — O24313 Unspecified pre-existing diabetes mellitus in pregnancy, third trimester: Secondary | ICD-10-CM

## 2020-12-06 DIAGNOSIS — Z148 Genetic carrier of other disease: Secondary | ICD-10-CM

## 2020-12-06 DIAGNOSIS — O9A213 Injury, poisoning and certain other consequences of external causes complicating pregnancy, third trimester: Secondary | ICD-10-CM | POA: Insufficient documentation

## 2020-12-06 DIAGNOSIS — O09523 Supervision of elderly multigravida, third trimester: Secondary | ICD-10-CM | POA: Diagnosis not present

## 2020-12-06 DIAGNOSIS — Z3689 Encounter for other specified antenatal screening: Secondary | ICD-10-CM | POA: Diagnosis not present

## 2020-12-06 DIAGNOSIS — Z79899 Other long term (current) drug therapy: Secondary | ICD-10-CM | POA: Insufficient documentation

## 2020-12-06 DIAGNOSIS — O09299 Supervision of pregnancy with other poor reproductive or obstetric history, unspecified trimester: Secondary | ICD-10-CM

## 2020-12-06 DIAGNOSIS — O99210 Obesity complicating pregnancy, unspecified trimester: Secondary | ICD-10-CM

## 2020-12-06 DIAGNOSIS — R519 Headache, unspecified: Secondary | ICD-10-CM | POA: Insufficient documentation

## 2020-12-06 DIAGNOSIS — O30043 Twin pregnancy, dichorionic/diamniotic, third trimester: Secondary | ICD-10-CM

## 2020-12-06 DIAGNOSIS — O24319 Unspecified pre-existing diabetes mellitus in pregnancy, unspecified trimester: Secondary | ICD-10-CM

## 2020-12-06 DIAGNOSIS — O358XX0 Maternal care for other (suspected) fetal abnormality and damage, not applicable or unspecified: Secondary | ICD-10-CM

## 2020-12-06 DIAGNOSIS — O99891 Other specified diseases and conditions complicating pregnancy: Secondary | ICD-10-CM | POA: Diagnosis not present

## 2020-12-06 DIAGNOSIS — Y92410 Unspecified street and highway as the place of occurrence of the external cause: Secondary | ICD-10-CM | POA: Insufficient documentation

## 2020-12-06 DIAGNOSIS — Z7901 Long term (current) use of anticoagulants: Secondary | ICD-10-CM | POA: Insufficient documentation

## 2020-12-06 LAB — URINALYSIS, ROUTINE W REFLEX MICROSCOPIC
Bilirubin Urine: NEGATIVE
Glucose, UA: NEGATIVE mg/dL
Ketones, ur: NEGATIVE mg/dL
Leukocytes,Ua: NEGATIVE
Nitrite: NEGATIVE
Protein, ur: NEGATIVE mg/dL
Specific Gravity, Urine: 1.01 (ref 1.005–1.030)
pH: 7 (ref 5.0–8.0)

## 2020-12-06 MED ORDER — ACETAMINOPHEN 500 MG PO TABS
1000.0000 mg | ORAL_TABLET | Freq: Once | ORAL | Status: AC
  Administered 2020-12-06: 1000 mg via ORAL
  Filled 2020-12-06: qty 2

## 2020-12-06 MED ORDER — NIFEDIPINE 10 MG PO CAPS
10.0000 mg | ORAL_CAPSULE | ORAL | Status: AC | PRN
  Administered 2020-12-06 (×3): 10 mg via ORAL
  Filled 2020-12-06 (×3): qty 1

## 2020-12-06 MED ORDER — CYCLOBENZAPRINE HCL 5 MG PO TABS
10.0000 mg | ORAL_TABLET | Freq: Once | ORAL | Status: AC
  Administered 2020-12-06: 10 mg via ORAL
  Filled 2020-12-06: qty 2

## 2020-12-06 NOTE — Discharge Instructions (Signed)
Activity Restriction During Pregnancy Your health care provider may recommend specific activity restrictions during pregnancy for a variety of reasons. Activity restriction may require that you limit activities that require great effort, such as exercise, lifting, or sex. The type of activity restriction will vary for each person, depending on your risk or the problems you are having. Activity restriction may be recommended for a period of time until your baby is delivered. Why are activity restrictions recommended? Activity restriction may be recommended if:  Your placenta is partially or completely covering the opening of your cervix (placenta previa).  There is bleeding between the wall of the uterus and the amniotic sac in the first trimester of pregnancy (subchorionic hemorrhage).  You went into labor too early (preterm labor).  You have a history of miscarriage.  You have a condition that causes high blood pressure during pregnancy (preeclampsia or eclampsia).  You are pregnant with more than one baby.  Your baby is not growing well. What are the risks? The risks depend on your specific restriction. Strict bed rest has the most physical and emotional risks and is no longer routinely recommended. Risks of strict bed rest include:  Loss of muscle conditioning from not moving.  Blood clots.  Social isolation.  Depression.  Loss of income. Talk with your health care team about activity restriction to decide if it is best for you and your baby. Even if you are having problems during your pregnancy, you may be able to continue with normal levels of activity with careful monitoring by your health care team. Follow these instructions at home: If needed, based on your overall health and the health of your baby, your health care provider will decide which type of activity restriction is right for you. Activity restrictions may include:  Not lifting anything heavier than 10 pounds (4.5  kg).  Avoiding activities that take a lot of physical effort.  No lifting or straining.  Resting in a sitting position or lying down for periods of time during the day. Pelvic rest may be recommended along with activity restrictions. If pelvic rest is recommended, then:  Do not have sex, an orgasm, or use sexual stimulation.  Do not use tampons. Do not douche. Do not put anything into your vagina.  Do not lift anything that is heavier than 10 lb (4.5 kg).  Avoid activities that require a lot of effort.  Avoid any activity in which your pelvic muscles could become strained, such as squatting.   Questions to ask your health care provider  Why is my activity being limited?  How will activity restrictions affect my body?  Why is rest helpful for me and my baby?  What activities can I do?  When can I return to normal activities? When should I seek immediate medical care? Seek immediate medical care if you have:  Vaginal bleeding.  Vaginal discharge.  Cramping pain in your lower abdomen.  Regular contractions.  A low, dull backache. Summary  Your health care provider may recommend specific activity restrictions during pregnancy for a variety of reasons.  Activity restriction may require that you limit activities such as exercise, lifting, sex, or any other activity that requires great effort.  Discuss the risks and benefits of activity restriction with your health care team to decide if it is best for you and your baby.  Contact your health care provider right away if you think you are having contractions, or if you notice vaginal bleeding, discharge, or cramping. This information  is not intended to replace advice given to you by your health care provider. Make sure you discuss any questions you have with your health care provider. Document Revised: 06/17/2019 Document Reviewed: 01/14/2018 Elsevier Patient Education  [place].
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
## 2020-12-12 ENCOUNTER — Ambulatory Visit: Payer: Medicare Other | Attending: Obstetrics and Gynecology

## 2020-12-12 ENCOUNTER — Other Ambulatory Visit: Payer: Self-pay | Admitting: *Deleted

## 2020-12-12 ENCOUNTER — Other Ambulatory Visit: Payer: Self-pay

## 2020-12-12 ENCOUNTER — Encounter: Payer: Self-pay | Admitting: *Deleted

## 2020-12-12 ENCOUNTER — Ambulatory Visit: Payer: Medicare Other | Admitting: *Deleted

## 2020-12-12 DIAGNOSIS — O09299 Supervision of pregnancy with other poor reproductive or obstetric history, unspecified trimester: Secondary | ICD-10-CM | POA: Diagnosis present

## 2020-12-12 DIAGNOSIS — O99210 Obesity complicating pregnancy, unspecified trimester: Secondary | ICD-10-CM | POA: Diagnosis present

## 2020-12-12 DIAGNOSIS — O30043 Twin pregnancy, dichorionic/diamniotic, third trimester: Secondary | ICD-10-CM | POA: Insufficient documentation

## 2020-12-12 DIAGNOSIS — Z362 Encounter for other antenatal screening follow-up: Secondary | ICD-10-CM

## 2020-12-12 DIAGNOSIS — O24319 Unspecified pre-existing diabetes mellitus in pregnancy, unspecified trimester: Secondary | ICD-10-CM | POA: Insufficient documentation

## 2020-12-12 DIAGNOSIS — E119 Type 2 diabetes mellitus without complications: Secondary | ICD-10-CM

## 2020-12-12 DIAGNOSIS — Z148 Genetic carrier of other disease: Secondary | ICD-10-CM

## 2020-12-12 DIAGNOSIS — Z3A33 33 weeks gestation of pregnancy: Secondary | ICD-10-CM

## 2020-12-12 DIAGNOSIS — O24313 Unspecified pre-existing diabetes mellitus in pregnancy, third trimester: Secondary | ICD-10-CM

## 2020-12-12 DIAGNOSIS — Z7984 Long term (current) use of oral hypoglycemic drugs: Secondary | ICD-10-CM

## 2020-12-12 DIAGNOSIS — E669 Obesity, unspecified: Secondary | ICD-10-CM

## 2020-12-14 ENCOUNTER — Ambulatory Visit (INDEPENDENT_AMBULATORY_CARE_PROVIDER_SITE_OTHER): Payer: Medicare Other | Admitting: Obstetrics and Gynecology

## 2020-12-14 ENCOUNTER — Other Ambulatory Visit: Payer: Self-pay

## 2020-12-14 VITALS — BP 99/66 | HR 92 | Wt 204.0 lb

## 2020-12-14 DIAGNOSIS — Z3A33 33 weeks gestation of pregnancy: Secondary | ICD-10-CM | POA: Insufficient documentation

## 2020-12-14 DIAGNOSIS — D582 Other hemoglobinopathies: Secondary | ICD-10-CM

## 2020-12-14 DIAGNOSIS — O24319 Unspecified pre-existing diabetes mellitus in pregnancy, unspecified trimester: Secondary | ICD-10-CM

## 2020-12-14 DIAGNOSIS — O0993 Supervision of high risk pregnancy, unspecified, third trimester: Secondary | ICD-10-CM

## 2020-12-14 DIAGNOSIS — O30049 Twin pregnancy, dichorionic/diamniotic, unspecified trimester: Secondary | ICD-10-CM

## 2020-12-14 DIAGNOSIS — O4703 False labor before 37 completed weeks of gestation, third trimester: Secondary | ICD-10-CM

## 2020-12-14 DIAGNOSIS — R768 Other specified abnormal immunological findings in serum: Secondary | ICD-10-CM

## 2020-12-14 MED ORDER — INSULIN GLARGINE 100 UNITS/ML SOLOSTAR PEN
40.0000 [IU] | PEN_INJECTOR | Freq: Every day | SUBCUTANEOUS | 12 refills | Status: DC
Start: 2020-12-14 — End: 2021-01-07

## 2020-12-14 MED ORDER — INSULIN ASPART 100 UNIT/ML FLEXPEN: 10.0000 [IU] | PEN_INJECTOR | Freq: Three times a day (TID) | SUBCUTANEOUS | 11 refills | Status: DC

## 2020-12-14 NOTE — Patient Instructions (Signed)
[employer] ([place]).Twin and Triplet Pregnancy. [place]: [employer] for [place] ([place]); 2019.">  Multiple Pregnancy Multiple pregnancy means that a woman is carrying more than one baby at a time. She may be pregnant with twins, triplets, or more. The majority of multiple pregnancies are twins. Naturally conceiving triplets or more (higher-order multiples) is rare. Multiple pregnancies are riskier than single pregnancies. A woman with a multiple pregnancy is more likely to have certain problems during her pregnancy. How does a multiple pregnancy happen? A multiple pregnancy happens when:  The woman's body releases more than one egg at a time, and then each egg gets fertilized by a different sperm. ? This is the most common type of multiple pregnancy. ? Twins or other multiples produced this way are called fraternal. They are no more alike than non-multiple siblings are.  One sperm fertilizes one egg, which then divides into more than one embryo. ? Twins or other multiples produced this way are called identical. Identical multiples are always the same gender, and they look very much alike.   Who is most likely to have a multiple pregnancy? A multiple pregnancy is more likely to develop in women who:  Have had fertility treatment, especially if the treatment included fertility medicines.  Are older than 36 years of age.  Have already had four or more children.  Have a family history of multiple pregnancy. How is a multiple pregnancy diagnosed? A multiple pregnancy may be diagnosed based on:  Symptoms such as: ? Rapid weight gain in the first 3 months of pregnancy (first trimester). ? More severe nausea and breast tenderness than what is typical of a single pregnancy. ? A larger uterus than what is normal for the stage of the pregnancy.  Blood tests that detect a higher-than-normal level of human chorionic gonadotropin (hCG). This is a hormone that your  body produces in early pregnancy.  An ultrasound exam. This is used to confirm that you are carrying multiples. What risks come with multiple pregnancy? A multiple pregnancy puts you at a higher risk for certain problems during or after your pregnancy. These include:  Delivering your babies before your due date (preterm birth). A full-term pregnancy lasts for at least 37 weeks. ? Babies born before 42 weeks may have a higher risk for breathing problems, feeding difficulties, cerebral palsy, and learning disabilities.  Diabetes.  Preeclampsia. This is a serious condition that causes high blood pressure and headaches during pregnancy.  Too much blood loss after childbirth (postpartum hemorrhage).  Postpartum depression.  Low birth weight of the babies. How will having a multiple pregnancy affect my care? Your health care team will monitor you more closely. You may need more frequent prenatal visits. This will ensure that you are healthy and that your babies are growing normally. Follow these instructions at home: Eating and drinking  Increase your nutrition. ? Follow your health care provider's recommendations for weight gain. You may need to gain a little extra weight when you are pregnant with multiples. ? Eat healthy snacks often throughout the day. This will add calories and reduce nausea.  Drink enough fluid to keep your urine pale yellow.  Take prenatal vitamins. Ask your health care provider what vitamins are right for you. Activity Limit your activities by 20-24 weeks of pregnancy.  Rest often.  Avoid activities, exercise, and work that take a lot of effort.  Ask your health care provider when you should stop having sex. General instructions  Do not  use any products that contain nicotine or tobacco, such as cigarettes, e-cigarettes, and chewing tobacco. If you need help quitting, ask your health care provider.  Do not drink alcohol or use illegal drugs.  Take  over-the-counter and prescription medicines only as told by your health care provider.  Arrange for extra help around the house.  Keep all follow-up visits and all prenatal visits as told by your health care provider. This is important. Where to find more information  [employer]: www.acog.org Contact a health care provider if:  You have dizziness.  You have nausea, vomiting, or diarrhea that does not go away.  You have depression or other emotions that are interfering with your normal activities.  You have a fever.  You have pain with urination.  You have a bad-smelling vaginal discharge.  You notice increased swelling in your face, hands, legs, or ankles. Get help right away if:  You have fluid leaking from your vagina.  You have bleeding from your vagina.  You have pelvic cramps, pelvic pressure, or nagging pain in your abdomen or lower back.  You are having regular contractions.  You have a severe headache, with or without changes in how you see.  You have chest pain or shortness of breath.  You notice that your babies move less often, or do not move at all. Summary  Having a multiple pregnancy means that a woman is carrying more than one baby at a time.  A multiple pregnancy puts you at a higher risk for delivering your babies before your due date, having diabetes, preeclampsia, too much blood loss after childbirth, or low birth weight of the babies.  Your health care provider will monitor you more closely during your pregnancy.  You may need to make some lifestyle changes during pregnancy. This includes eating more, limiting your activities after 20-24 weeks of pregnancy, and arranging for extra help around the house.  Follow up with your health care provider as instructed if you experience any complications. This information is not intended to replace advice given to you by your health care provider. Make sure you discuss  any questions you have with your health care provider. Document Revised: 05/18/2019 Document Reviewed: 05/18/2019 Elsevier Patient Education  [place].

## 2020-12-14 NOTE — Progress Notes (Signed)
+   Fetal movement. Pt c/o increased pressure and irregular contractions.

## 2020-12-14 NOTE — Progress Notes (Signed)
   PRENATAL VISIT NOTE  Subjective:  Tiffany Hall is a 35 y.o. [id number] at [redacted]w[redacted]d being seen today for ongoing prenatal care.  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  Patient doing well with no acute concerns today. She reports increased blood suagrs since hosptial discharge.  Contractions: Irregular. Vag. Bleeding: None.  Movement: Present. Denies leaking of fluid.  Pt states she has had several blood sugars above 200 since her hospital discharge.  The following portions of the patient's history were reviewed and updated as appropriate: allergies, current medications, past family history, past medical history, past social history, past surgical history and problem list. Problem list updated.  Objective:   Vitals:   12/14/20 0923  BP: 99/66  Pulse: 92  Weight: 204 lb (92.5 kg)    Fetal Status: Fetal Heart Rate (bpm): 145/141 Fundal Height: 44 cm Movement: Present     General:  Alert, oriented and cooperative. Patient is in no acute distress.  Skin: Skin is warm and dry. No rash noted.   Cardiovascular: Normal heart rate noted  Respiratory: Normal respiratory effort, no problems with respiration noted  Abdomen: Soft, gravid, appropriate for gestational age.  Pain/Pressure: Present     Pelvic: Cervical exam deferred        Extremities: Normal range of motion.  Edema: Trace  Mental Status:  Normal mood and  affect. Normal behavior. Normal judgment and thought content.   Assessment and Plan:  Pregnancy: [id number] at [redacted]w[redacted]d  1. Pre-existing diabetes mellitus affecting pregnancy, antepartum Delivery suggested at 38 weeks unless blood sugars uncontrolled  Insulin calculated for BS control  50 units of lantus based off 84 units total Pt advised to start at 40 units subcutaneous daily  37 units regular insulin split between meals, pt advised to start at 10 units  Per meal  - insulin glargine (LANTUS) 100 unit/mL SOPN; Inject 40 Units into the skin daily.  Dispense: 18 each; Refill: 12 - insulin aspart (NOVOLOG) 100 UNIT/ML FlexPen; Inject 10 Units into the skin 3 (three) times daily with meals.  Dispense: 24 each; Refill: 11  2. Supervision of high risk pregnancy in third trimester   3. Dichorionic diamniotic twin pregnancy, antepartum Pt has weekly BPP , last BPP 8/8 x2 Vtx/vtx last scan  4. Hemoglobin C trait ([place])   5. HSV-2 seropositive Valtrex at 36 weeks  6. Preterm uterine contractions in third trimester, antepartum Monitor, preterm labor precautions given  7. [redacted] weeks gestation of pregnancy   Preterm labor symptoms and general obstetric precautions including but not limited to vaginal bleeding, contractions, leaking of fluid and fetal movement were reviewed in detail with the patient.  Please refer to After Visit Summary for other counseling recommendations.   Return in about 1 week (around 12/21/2020) for [facility], in person.  Check blood sugars next visit   Lynnda Shields, MD Faculty Attending [facility] for [facility]

## 2020-12-15 ENCOUNTER — Encounter: Payer: Medicare Other | Admitting: Obstetrics and Gynecology

## 2020-12-19 ENCOUNTER — Ambulatory Visit: Payer: Medicare Other | Admitting: *Deleted

## 2020-12-19 ENCOUNTER — Encounter: Payer: Self-pay | Admitting: *Deleted

## 2020-12-19 ENCOUNTER — Ambulatory Visit (HOSPITAL_BASED_OUTPATIENT_CLINIC_OR_DEPARTMENT_OTHER): Payer: Medicare Other

## 2020-12-19 ENCOUNTER — Other Ambulatory Visit: Payer: Self-pay

## 2020-12-19 ENCOUNTER — Inpatient Hospital Stay (HOSPITAL_COMMUNITY)
Admission: AD | Admit: 2020-12-19 | Discharge: 2020-12-20 | Disposition: A | Discharge status: Home / Self Care | Payer: Medicare Other | Attending: Obstetrics & Gynecology | Admitting: Obstetrics & Gynecology

## 2020-12-19 ENCOUNTER — Telehealth: Payer: Self-pay

## 2020-12-19 ENCOUNTER — Encounter (HOSPITAL_COMMUNITY): Payer: Self-pay | Admitting: Obstetrics & Gynecology

## 2020-12-19 DIAGNOSIS — Z8249 Family history of ischemic heart disease and other diseases of the circulatory system: Secondary | ICD-10-CM | POA: Diagnosis not present

## 2020-12-19 DIAGNOSIS — R519 Headache, unspecified: Secondary | ICD-10-CM | POA: Diagnosis not present

## 2020-12-19 DIAGNOSIS — O09299 Supervision of pregnancy with other poor reproductive or obstetric history, unspecified trimester: Secondary | ICD-10-CM

## 2020-12-19 DIAGNOSIS — O10913 Unspecified pre-existing hypertension complicating pregnancy, third trimester: Secondary | ICD-10-CM | POA: Insufficient documentation

## 2020-12-19 DIAGNOSIS — E119 Type 2 diabetes mellitus without complications: Secondary | ICD-10-CM

## 2020-12-19 DIAGNOSIS — O99213 Obesity complicating pregnancy, third trimester: Secondary | ICD-10-CM

## 2020-12-19 DIAGNOSIS — E669 Obesity, unspecified: Secondary | ICD-10-CM

## 2020-12-19 DIAGNOSIS — Z148 Genetic carrier of other disease: Secondary | ICD-10-CM

## 2020-12-19 DIAGNOSIS — O30043 Twin pregnancy, dichorionic/diamniotic, third trimester: Secondary | ICD-10-CM | POA: Insufficient documentation

## 2020-12-19 DIAGNOSIS — Z7982 Long term (current) use of aspirin: Secondary | ICD-10-CM | POA: Insufficient documentation

## 2020-12-19 DIAGNOSIS — O24319 Unspecified pre-existing diabetes mellitus in pregnancy, unspecified trimester: Secondary | ICD-10-CM

## 2020-12-19 DIAGNOSIS — O403XX2 Polyhydramnios, third trimester, fetus 2: Secondary | ICD-10-CM

## 2020-12-19 DIAGNOSIS — O99210 Obesity complicating pregnancy, unspecified trimester: Secondary | ICD-10-CM

## 2020-12-19 DIAGNOSIS — Z880 Allergy status to penicillin: Secondary | ICD-10-CM | POA: Insufficient documentation

## 2020-12-19 DIAGNOSIS — Z362 Encounter for other antenatal screening follow-up: Secondary | ICD-10-CM

## 2020-12-19 DIAGNOSIS — O26893 Other specified pregnancy related conditions, third trimester: Secondary | ICD-10-CM | POA: Diagnosis present

## 2020-12-19 DIAGNOSIS — Z87891 Personal history of nicotine dependence: Secondary | ICD-10-CM | POA: Insufficient documentation

## 2020-12-19 DIAGNOSIS — Z3A34 34 weeks gestation of pregnancy: Secondary | ICD-10-CM

## 2020-12-19 DIAGNOSIS — O24313 Unspecified pre-existing diabetes mellitus in pregnancy, third trimester: Secondary | ICD-10-CM | POA: Diagnosis not present

## 2020-12-19 DIAGNOSIS — O0993 Supervision of high risk pregnancy, unspecified, third trimester: Secondary | ICD-10-CM

## 2020-12-19 DIAGNOSIS — Z79899 Other long term (current) drug therapy: Secondary | ICD-10-CM | POA: Insufficient documentation

## 2020-12-19 LAB — GLUCOSE, CAPILLARY: Glucose-Capillary: 125 mg/dL — ABNORMAL HIGH (ref 70–99)

## 2020-12-19 MED ORDER — BUTALBITAL-APAP-CAFFEINE 50-325-40 MG PO TABS
2.0000 | ORAL_TABLET | Freq: Once | ORAL | Status: AC
  Administered 2020-12-19: 2 via ORAL
  Filled 2020-12-19: qty 2

## 2020-12-19 NOTE — MAU Provider Note (Signed)
History     CSN: [id number]  Arrival date and time: 12/19/20 2243   Event Date/Time   First Provider Initiated Contact with Patient 12/19/20 2328      Chief Complaint  Patient presents with  . Contractions  . Headache  . Hypertension   HPI Tiffany Hall is a 35 y.o. G6Y4034 at 65w5dwho presents with a headache. She reports she has not had an appetite for the last several days so she has not been eating or taking her insulin. She rates her headache a 7/10 and tried tylenol with no relief. She reports her blood pressure was elevated at home but was not able to give a number. She also reports intermittent contractions that are unchanged from the last several weeks. She denies any bleeding or leaking. Reports normal fetal movement of both babies.   OB History    Gravida  8   Para  5   Term  5   Preterm  0   AB  2   Living  5     SAB  2   IAB  0   Ectopic  0   Multiple  0   Live Births  3           Past Medical History:  Diagnosis Date  . Anemia   . Anxiety   . Depression   . GERD (gastroesophageal reflux disease)    medication not needed  . Heart murmur   . History of abnormal cervical Pap smear    2009  . History of cardiac murmur as a child   . History of chlamydia    2008  . History of gallstones    2012  . History of gonorrhea    2008  . History of ovarian cyst   . History of trichomonal vaginitis    2008  . Hypertension    PIH with pregnancy  . Migraine headache   . Preeclampsia in postpartum period 01/15/2015  . [place]   . Vaginal Pap smear, abnormal    HPV +    Past Surgical History:  Procedure Laterality Date  . COLPOSCOPY    . dilatation and currettage  N/A   . DILATION AND CURETTAGE OF UTERUS  2009   W/  SUCTION --  required blood transfusion  . DILATION AND EVACUATION N/A 09/14/2015   Procedure: DILATATION AND EVACUATION;  Surgeon: MLinda Hedges DO;  [facility];  Service: Gynecology;  Laterality: N/A;     Family History  Problem Relation Age of Onset  . Diabetes Mother   . Hypertension Mother   . Hepatitis Mother   . Cancer Mother 268      Breast, cervical  . Diabetes Maternal Grandmother   . Hypertension Maternal Grandmother   . Cancer Maternal Grandmother 40       Breast  . Anesthesia problems Neg Hx   . Other Neg Hx     Social History   Tobacco Use  . Smoking status: Former Smoker    Packs/day: 0.00    Types: Cigarettes    Quit date: 11/05/2010    Years since quitting: 10.1  . Smokeless tobacco: Never Used  . Tobacco comment: quit with preg  Vaping Use  . Vaping Use: Never used  Substance Use Topics  . Alcohol use: No    Comment: occ  . Drug use: No    Types: MDMA (Ecstacy)    Comment: prior to this pregnancy in 2009  Allergies:  Allergies  Allergen Reactions  . Amoxicillin Swelling    Did it involve swelling of the face/tongue/throat, SOB, or low BP? No Did it involve sudden or severe rash/hives, skin peeling, or any reaction on the inside of your mouth or nose? No Did you need to seek medical attention at a hospital or doctor's office? No When did it last happen?last year If all above answers are "NO", may proceed with cephalosporin use.   . Nubain [Nalbuphine Hcl] Swelling, Palpitations and Other (See Comments)    Pt states that she is able to take Percocet without any problems.       Medications Prior to Admission  Medication Sig Dispense Refill Last Dose  . Accu-Chek Softclix Lancets lancets Use as instructed 100 each 12 12/19/2020 at Unknown time  . acetaminophen (TYLENOL) 500 MG tablet Take 1,000 mg by mouth every 6 (six) hours as needed for mild pain or headache.   12/19/2020 at 2000  . aspirin EC 81 MG tablet Take 1 tablet (81 mg total) by mouth daily. Take after 12 weeks for prevention of preeclampsia later in pregnancy 300 tablet 2 Past Week at Unknown time  . Blood Glucose Monitoring Suppl (ACCU-CHEK GUIDE) w/Device KIT 1 Device by Does  not apply route 4 (four) times daily. 1 kit 0 12/19/2020 at Unknown time  . docusate sodium (COLACE) 100 MG capsule Take 100 mg by mouth 2 (two) times daily.   12/18/2020 at Unknown time  . ferrous sulfate 325 (65 FE) MG tablet Take 1 tablet (325 mg total) by mouth 2 (two) times daily with a meal. 60 tablet 5 Past Week at Unknown time  . glucose blood (ACCU-CHEK GUIDE) test strip Use to check blood sugars four times a day was instructed 50 each 12 12/19/2020 at Unknown time  . insulin aspart (NOVOLOG) 100 UNIT/ML FlexPen Inject 10 Units into the skin 3 (three) times daily with meals. 24 each 11 12/18/2020 at Unknown time  . metFORMIN (GLUCOPHAGE) 500 MG tablet Take 500 mg by mouth 2 (two) times daily.   Past Week at Unknown time  . Prenatal Vit-Fe Fumarate-FA (PRENATAL MULTIVITAMIN) TABS tablet Take 1 tablet by mouth daily at 12 noon.   Past Week at Unknown time  . Blood Pressure Monitoring (BLOOD PRESSURE KIT) DEVI 1 kit by Does not apply route once a week. Check Blood Pressure regularly and record readings into the [employer] App.  Large Cuff.  DX O90.0 (Patient taking differently: 1 kit by Does not apply route once a week. Check Blood Pressure regularly and record readings into the [employer] App.  Large Cuff.  DX O90.0) 1 each 0   . insulin glargine (LANTUS) 100 unit/mL SOPN Inject 40 Units into the skin daily. (Patient not taking: Reported on 12/19/2020) 18 each 12     Review of Systems  Constitutional: Negative.  Negative for fatigue and fever.  HENT: Negative.   Respiratory: Negative.  Negative for shortness of breath.   Cardiovascular: Negative.  Negative for chest pain.  Gastrointestinal: Negative.  Negative for abdominal pain, constipation, diarrhea, nausea and vomiting.  Genitourinary: Negative.  Negative for dysuria, vaginal bleeding and vaginal discharge.  Neurological: Positive for headaches. Negative for dizziness.   Physical Exam   Blood pressure 131/77, pulse 88, temperature 98.7  F (37.1 C), temperature source Oral, resp. rate 15, last menstrual period 04/21/2020, SpO2 100 %. Patient Vitals for the past 24 hrs:  BP Temp Temp src Pulse Resp SpO2  12/20/20 0034 130/76 -- --  82 15 100 %  12/19/20 2313 131/77 -- -- 88 -- --  12/19/20 2301 128/86 98.7 F (37.1 C) Oral (!) 103 15 100 %    Physical Exam Vitals and nursing note reviewed.  Constitutional:      General: She is not in acute distress.    Appearance: She is well-developed.  HENT:     Head: Normocephalic.  Eyes:     Pupils: Pupils are equal, round, and reactive to light.  Cardiovascular:     Rate and Rhythm: Normal rate and regular rhythm.     Heart sounds: Normal heart sounds.  Pulmonary:     Effort: Pulmonary effort is normal. No respiratory distress.     Breath sounds: Normal breath sounds.  Abdominal:     General: Bowel sounds are normal. There is no distension.     Palpations: Abdomen is soft.     Tenderness: There is no abdominal tenderness.  Skin:    General: Skin is warm and dry.  Neurological:     Mental Status: She is alert and oriented to person, place, and time.  Psychiatric:        Behavior: Behavior normal.        Thought Content: Thought content normal.        Judgment: Judgment normal.    Fetal Tracing:  Baby A   Baseline: 150 Variability: moderate Accels: 15x15 Decels: none  Baby B  Baseline: 125 Variability: moderate Accels:  15x15 Decels: none   Toco: occasional uc's   MAU Course  Procedures Results for orders placed or performed during the hospital encounter of 12/19/20 (from the past 24 hour(s))  Glucose, capillary     Status: Abnormal   Collection Time: 12/19/20 11:02 PM  Result Value Ref Range   Glucose-Capillary 125 (H) 70 - 99 mg/dL  Urinalysis, Routine w reflex microscopic Urine, Clean Catch     Status: Abnormal   Collection Time: 12/19/20 11:16 PM  Result Value Ref Range   Color, Urine YELLOW YELLOW   APPearance HAZY (A) CLEAR   Specific  Gravity, Urine 1.024 1.005 - 1.030   pH 6.0 5.0 - 8.0   Glucose, UA NEGATIVE NEGATIVE mg/dL   Hgb urine dipstick NEGATIVE NEGATIVE   Bilirubin Urine NEGATIVE NEGATIVE   Ketones, ur 5 (A) NEGATIVE mg/dL   Protein, ur NEGATIVE NEGATIVE mg/dL   Nitrite NEGATIVE NEGATIVE   Leukocytes,Ua NEGATIVE NEGATIVE   MDM UA Fioricet PO- patient reports improvement of headache  At discharge, patient complained of leaking of fluid since yesterday. SSE showed copious thick white discharge, no pooling of fluid. Will treat for yeast  Discussed importance of eating regularly and taking insulin as prescribed.   Assessment and Plan   1. Pregnancy headache in third trimester   2. [redacted] weeks gestation of pregnancy    -Discharge home in stable condition -Diabetes precautions discussed -Patient advised to follow-up with OB as scheduled for prenatal care -Patient may return to MAU as needed or if her condition were to change or worsen   Wende Mott CNM 12/19/2020, 11:28 PM

## 2020-12-19 NOTE — MAU Note (Signed)
.   Tiffany Hall is a 35 y.o. at [redacted]w[redacted]d here in MAU reporting: Headache and blurry vision for x3 hours. States her BP was 150s/90s at home. No VB. Is having some watery discharge that began yesterday at 1200. Endorses fetal movement. States that for the past three days she has only been eating once a day so she hasn't been taking her insulin like she should. Also reports vtx about 7 mins apart. Supposed to be admitted at 36 weeks for glucose monitoring.   Pain score: 7 Vitals:   12/19/20 2301  BP: 128/86  Pulse: (!) 103  Resp: 15  Temp: 98.7 F (37.1 C)  SpO2: 100%     FHT:149, 138 Lab orders placed from triage: UA

## 2020-12-19 NOTE — Telephone Encounter (Signed)
Recv'd Mychart message from patient regarding inpatient management at 36 wks. I called pt to get clarity. Pt said after BPP today, MFM informed her to report to MAU at 36 weeks for inpatient management for a week, then IOL at 37 weeks. I informed pt the report states "IF" diabetes is not well controlled, then inpatient management for control is an option and IOL will be considered at 37 weeks. Pt voiced confusion.  Consulted with MD for clarity, at this time nothing is scheduled for pt to report to MAU at 36 weeks. We can solidify the induction plan after the next ultrasound but for now, we may be able to induce her at 38-39 weeks.   Pt agrees to monitor CBG's and bring all readings to each visit.

## 2020-12-20 ENCOUNTER — Encounter: Payer: Medicare Other | Admitting: Obstetrics and Gynecology

## 2020-12-20 ENCOUNTER — Encounter: Payer: Self-pay | Admitting: Advanced Practice Midwife

## 2020-12-20 DIAGNOSIS — O26893 Other specified pregnancy related conditions, third trimester: Secondary | ICD-10-CM | POA: Diagnosis not present

## 2020-12-20 LAB — URINALYSIS, ROUTINE W REFLEX MICROSCOPIC
Bilirubin Urine: NEGATIVE
Glucose, UA: NEGATIVE mg/dL
Hgb urine dipstick: NEGATIVE
Ketones, ur: 5 mg/dL — AB
Leukocytes,Ua: NEGATIVE
Nitrite: NEGATIVE
Protein, ur: NEGATIVE mg/dL
Specific Gravity, Urine: 1.024 (ref 1.005–1.030)
pH: 6 (ref 5.0–8.0)

## 2020-12-20 MED ORDER — TERCONAZOLE 0.4 % VA CREA: 1.0000 | TOPICAL_CREAM | Freq: Every day | VAGINAL | 0 refills | Status: DC

## 2020-12-20 NOTE — Discharge Instructions (Signed)
Diabetes Mellitus and Nutrition, Adult When you have diabetes, or diabetes mellitus, it is very important to have healthy eating habits because your blood sugar (glucose) levels are greatly affected by what you eat and drink. Eating healthy foods in the right amounts, at about the same times every day, can help you:  Control your blood glucose.  Lower your risk of heart disease.  Improve your blood pressure.  Reach or maintain a healthy weight. What can affect my meal plan? Every person with diabetes is different, and each person has different needs for a meal plan. Your health care provider may recommend that you work with a dietitian to make a meal plan that is best for you. Your meal plan may vary depending on factors such as:  The calories you need.  The medicines you take.  Your weight.  Your blood glucose, blood pressure, and cholesterol levels.  Your activity level.  Other health conditions you have, such as heart or kidney disease. How do carbohydrates affect me? Carbohydrates, also called carbs, affect your blood glucose level more than any other type of food. Eating carbs naturally raises the amount of glucose in your blood. Carb counting is a method for keeping track of how many carbs you eat. Counting carbs is important to keep your blood glucose at a healthy level, especially if you use insulin or take certain oral diabetes medicines. It is important to know how many carbs you can safely have in each meal. This is different for every person. Your dietitian can help you calculate how many carbs you should have at each meal and for each snack. How does alcohol affect me? Alcohol can cause a sudden decrease in blood glucose (hypoglycemia), especially if you use insulin or take certain oral diabetes medicines. Hypoglycemia can be a life-threatening condition. Symptoms of hypoglycemia, such as sleepiness, dizziness, and confusion, are similar to symptoms of having too much  alcohol.  Do not drink alcohol if: ? Your health care provider tells you not to drink. ? You are pregnant, may be pregnant, or are planning to become pregnant.  If you drink alcohol: ? Do not drink on an empty stomach. ? Limit how much you use to:  0-1 drink a day for women.  0-2 drinks a day for men. ? Be aware of how much alcohol is in your drink. In the [place] one drink equals one 12 oz bottle of beer (355 mL), one 5 oz glass of wine (148 mL), or one 1 oz glass of hard liquor (44 mL). ? Keep yourself hydrated with water, diet soda, or unsweetened iced tea.  Keep in mind that regular soda, juice, and other mixers may contain a lot of sugar and must be counted as carbs. What are tips for following this plan? Reading food labels  Start by checking the serving size on the "Nutrition Facts" label of packaged foods and drinks. The amount of calories, carbs, fats, and other nutrients listed on the label is based on one serving of the item. Many items contain more than one serving per package.  Check the total grams (g) of carbs in one serving. You can calculate the number of servings of carbs in one serving by dividing the total carbs by 15. For example, if a food has 30 g of total carbs per serving, it would be equal to 2 servings of carbs.  Check the number of grams (g) of saturated fats and trans fats in one serving. Choose foods that have   a low amount or none of these fats.  Check the number of milligrams (mg) of salt (sodium) in one serving. Most people should limit total sodium intake to less than 2,300 mg per day.  Always check the nutrition information of foods labeled as "low-fat" or "nonfat." These foods may be higher in added sugar or refined carbs and should be avoided.  Talk to your dietitian to identify your daily goals for nutrients listed on the label. Shopping  Avoid buying canned, pre-made, or processed foods. These foods tend to be high in fat, sodium, and added  sugar.  Shop around the outside edge of the grocery store. This is where you will most often find fresh fruits and vegetables, bulk grains, fresh meats, and fresh dairy. Cooking  Use low-heat cooking methods, such as baking, instead of high-heat cooking methods like deep frying.  Cook using healthy oils, such as olive, canola, or sunflower oil.  Avoid cooking with butter, cream, or high-fat meats. Meal planning  Eat meals and snacks regularly, preferably at the same times every day. Avoid going long periods of time without eating.  Eat foods that are high in fiber, such as fresh fruits, vegetables, beans, and whole grains. Talk with your dietitian about how many servings of carbs you can eat at each meal.  Eat 4-6 oz (112-168 g) of lean protein each day, such as lean meat, chicken, fish, eggs, or tofu. One ounce (oz) of lean protein is equal to: ? 1 oz (28 g) of meat, chicken, or fish. ? 1 egg. ?  cup (62 g) of tofu.  Eat some foods each day that contain healthy fats, such as avocado, nuts, seeds, and fish.   What foods should I eat? Fruits Berries. Apples. Oranges. Peaches. Apricots. Plums. Grapes. Mango. Papaya. Pomegranate. Kiwi. Cherries. Vegetables Lettuce. Spinach. Leafy greens, including kale, chard, collard greens, and mustard greens. Beets. Cauliflower. Cabbage. Broccoli. Carrots. Green beans. Tomatoes. Peppers. Onions. Cucumbers. Brussels sprouts. Grains Whole grains, such as whole-wheat or whole-grain bread, crackers, tortillas, cereal, and pasta. Unsweetened oatmeal. Quinoa. Brown or wild rice. Meats and other proteins Seafood. Poultry without skin. Lean cuts of poultry and beef. Tofu. Nuts. Seeds. Dairy Low-fat or fat-free dairy products such as milk, yogurt, and cheese. The items listed above may not be a complete list of foods and beverages you can eat. Contact a dietitian for more information. What foods should I avoid? Fruits Fruits canned with  syrup. Vegetables Canned vegetables. Frozen vegetables with butter or cream sauce. Grains Refined white flour and flour products such as bread, pasta, snack foods, and cereals. Avoid all processed foods. Meats and other proteins Fatty cuts of meat. Poultry with skin. Breaded or fried meats. Processed meat. Avoid saturated fats. Dairy Full-fat yogurt, cheese, or milk. Beverages Sweetened drinks, such as soda or iced tea. The items listed above may not be a complete list of foods and beverages you should avoid. Contact a dietitian for more information. Questions to ask a health care provider  Do I need to meet with a diabetes educator?  Do I need to meet with a dietitian?  What number can I call if I have questions?  When are the best times to check my blood glucose? Where to find more information:  American Diabetes Association: diabetes.org  Academy of Nutrition and Dietetics: www.eatright.org  National Institute of Diabetes and Digestive and Kidney Diseases: www.niddk.nih.gov  Association of Diabetes Care and Education Specialists: www.diabeteseducator.org Summary  It is important to have healthy eating   habits because your blood sugar (glucose) levels are greatly affected by what you eat and drink.  A healthy meal plan will help you control your blood glucose and maintain a healthy lifestyle.  Your health care provider may recommend that you work with a dietitian to make a meal plan that is best for you.  Keep in mind that carbohydrates (carbs) and alcohol have immediate effects on your blood glucose levels. It is important to count carbs and to use alcohol carefully. This information is not intended to replace advice given to you by your health care provider. Make sure you discuss any questions you have with your health care provider. Document Revised: 09/01/2019 Document Reviewed: 09/01/2019 Elsevier Patient Education  [place].  Safe Medications in Pregnancy    Acne: Benzoyl Peroxide Salicylic Acid  Backache/Headache: Tylenol: 2 regular strength every 4 hours OR              2 Extra strength every 6 hours  Colds/Coughs/Allergies: Benadryl (alcohol free) 25 mg every 6 hours as needed Breath right strips Claritin Cepacol throat lozenges Chloraseptic throat spray Cold-Eeze- up to three times per day Cough drops, alcohol free Flonase (by prescription only) Guaifenesin Mucinex Robitussin DM (plain only, alcohol free) Saline nasal spray/drops Sudafed (pseudoephedrine) & Actifed ** use only after [redacted] weeks gestation and if you do not have high blood pressure Tylenol Vicks Vaporub Zinc lozenges Zyrtec   Constipation: Colace Ducolax suppositories Fleet enema Glycerin suppositories Metamucil Milk of magnesia Miralax Senokot Smooth move tea  Diarrhea: Kaopectate Imodium A-D  *NO pepto Bismol  Hemorrhoids: Anusol Anusol HC Preparation H Tucks  Indigestion: Tums Maalox Mylanta Zantac  Pepcid  Insomnia: Benadryl (alcohol free) 25mg  every 6 hours as needed Tylenol PM Unisom, no Gelcaps  Leg Cramps: Tums MagGel  Nausea/Vomiting:  Bonine Dramamine Emetrol Ginger extract Sea bands Meclizine  Nausea medication to take during pregnancy:  Unisom (doxylamine succinate 25 mg tablets) Take one tablet daily at bedtime. If symptoms are not adequately controlled, the dose can be increased to a maximum recommended dose of two tablets daily (1/2 tablet in the morning, 1/2 tablet mid-afternoon and one at bedtime). Vitamin B6 100mg  tablets. Take one tablet twice a day (up to 200 mg per day).  Skin Rashes: Aveeno products Benadryl cream or 25mg  every 6 hours as needed Calamine Lotion 1% cortisone cream  Yeast infection: Gyne-lotrimin 7 Monistat 7   **If taking multiple medications, please check labels to avoid duplicating the same active ingredients **take medication as directed on the label ** Do not exceed 4000  mg of tylenol in 24 hours **Do not take medications that contain aspirin or ibuprofen

## 2020-12-23 ENCOUNTER — Inpatient Hospital Stay (HOSPITAL_COMMUNITY)
Admission: AD | Admit: 2020-12-23 | Discharge: 2020-12-24 | Disposition: A | Discharge status: Home / Self Care | Payer: Medicare Other | Attending: Family Medicine | Admitting: Family Medicine

## 2020-12-23 ENCOUNTER — Encounter (HOSPITAL_COMMUNITY): Payer: Self-pay | Admitting: Family Medicine

## 2020-12-23 DIAGNOSIS — O30003 Twin pregnancy, unspecified number of placenta and unspecified number of amniotic sacs, third trimester: Secondary | ICD-10-CM | POA: Diagnosis not present

## 2020-12-23 DIAGNOSIS — O4703 False labor before 37 completed weeks of gestation, third trimester: Secondary | ICD-10-CM | POA: Diagnosis present

## 2020-12-23 DIAGNOSIS — O99210 Obesity complicating pregnancy, unspecified trimester: Secondary | ICD-10-CM

## 2020-12-23 DIAGNOSIS — Z3689 Encounter for other specified antenatal screening: Secondary | ICD-10-CM | POA: Diagnosis not present

## 2020-12-23 DIAGNOSIS — Z3A35 35 weeks gestation of pregnancy: Secondary | ICD-10-CM | POA: Diagnosis not present

## 2020-12-23 DIAGNOSIS — O24319 Unspecified pre-existing diabetes mellitus in pregnancy, unspecified trimester: Secondary | ICD-10-CM

## 2020-12-23 DIAGNOSIS — O0993 Supervision of high risk pregnancy, unspecified, third trimester: Secondary | ICD-10-CM

## 2020-12-23 DIAGNOSIS — O09299 Supervision of pregnancy with other poor reproductive or obstetric history, unspecified trimester: Secondary | ICD-10-CM

## 2020-12-23 LAB — URINALYSIS, ROUTINE W REFLEX MICROSCOPIC
Bilirubin Urine: NEGATIVE
Glucose, UA: NEGATIVE mg/dL
Hgb urine dipstick: NEGATIVE
Ketones, ur: 5 mg/dL — AB
Leukocytes,Ua: NEGATIVE
Nitrite: NEGATIVE
Protein, ur: 30 mg/dL — AB
Specific Gravity, Urine: 1.018 (ref 1.005–1.030)
pH: 6 (ref 5.0–8.0)

## 2020-12-23 MED ORDER — FENTANYL CITRATE (PF) 100 MCG/2ML IJ SOLN
100.0000 ug | Freq: Once | INTRAMUSCULAR | Status: AC
  Administered 2020-12-23: 100 ug via INTRAVENOUS
  Filled 2020-12-23: qty 2

## 2020-12-23 MED ORDER — ONDANSETRON HCL 4 MG/2ML IJ SOLN
4.0000 mg | Freq: Once | INTRAMUSCULAR | Status: AC
  Administered 2020-12-23: 4 mg via INTRAVENOUS
  Filled 2020-12-23: qty 2

## 2020-12-23 NOTE — MAU Note (Signed)
.   Tiffany Hall is a 35 y.o. at [redacted]w[redacted]d here in MAU reporting: ctx that grew in intensity x2 hours ago, Have had them on and off for x3 weeks. Denies VB and recent intercourse. No LOF. Endorses fetal movement. Ctx now 5-6 minutes apart. Also reports nausea. No vomiting or diarrhea. DiDi twins.   Pain score: 9 Vitals:   12/23/20 2119  BP: 126/76  Pulse: 95  Resp: 15  Temp: 98.5 F (36.9 C)  SpO2: 98%     FHT:148, 142 Lab orders placed from triage: [facility]

## 2020-12-23 NOTE — MAU Provider Note (Signed)
Event Date/Time   First Provider Initiated Contact with Patient 12/23/20 2129       S: [name] is a 35 y.o. [id number] at [redacted]w[redacted]d  who presents to MAU today complaining contractions q 6 minutes that have been ongoing, but have worsened in the past 2 hours. She denies vaginal bleeding. She denies LOF. She reports normal fetal movement x 2.    O: BP 126/76 (BP Location: Right Arm)    Pulse 95    Temp 98.5 F (36.9 C) (Oral)    Resp 15    LMP 04/21/2020    SpO2 98%  GENERAL: Well-developed, well-nourished female in mild distress c/w contractions.  HEAD: Normocephalic, atraumatic.  CHEST: Normal effort of breathing, regular heart rate ABDOMEN: Soft, nontender, gravid  Cervical exam:  Dilation: 3 Effacement (%): 80 Station: Ballotable Exam by:: Gavin Pound, CNM   Fetal Monitoring: Twin A Baseline: 145 Variability: Moderate Accelerations: Present Decelerations: None  Twin B Baseline: 140 Variability: Moderate Accelerations: Present Decelerations: None  Contractions: Q3-81min, palpates mild to moderate   A: TIUP at [redacted]w[redacted]d  Contractions  P: Start IV and give fluids Obtain and hold admission labs Collect GBS PCR Pain medication available if desired Will monitor for 2 hours and then reassess.   Gavin Pound, CNM 12/23/2020 9:29 PM  Reassessment (11:55 PM)  Cervical exam remains unchanged. Patient given option for discharge or continued monitoring. Patient states she is unsure. Given option for continued monitoring until fluids complete and agreeable. Offered and patient declines tocolytic to assist with contraction discomfort. Offered additional pain medication and patient declines. Will continue to monitor and reassess.   Reassessment (1:14 AM)  Exam remains unchanged. Will discharge to home. Patient instructed to keep appt as scheduled. Labor precautions given.  Maryann Conners MSN, CNM Advanced Practice Provider, [facility]

## 2020-12-24 LAB — GROUP B STREP BY PCR: Group B strep by PCR: NEGATIVE

## 2020-12-25 ENCOUNTER — Inpatient Hospital Stay (HOSPITAL_COMMUNITY)
Admission: AD | Admit: 2020-12-25 | Discharge: 2020-12-25 | Disposition: A | Discharge status: Home / Self Care | Payer: Medicare Other | Attending: Obstetrics & Gynecology | Admitting: Obstetrics & Gynecology

## 2020-12-25 ENCOUNTER — Other Ambulatory Visit: Payer: Self-pay

## 2020-12-25 ENCOUNTER — Encounter (HOSPITAL_COMMUNITY): Payer: Self-pay | Admitting: Obstetrics & Gynecology

## 2020-12-25 DIAGNOSIS — Z3A35 35 weeks gestation of pregnancy: Secondary | ICD-10-CM | POA: Diagnosis not present

## 2020-12-25 DIAGNOSIS — Z7982 Long term (current) use of aspirin: Secondary | ICD-10-CM | POA: Diagnosis not present

## 2020-12-25 DIAGNOSIS — O09293 Supervision of pregnancy with other poor reproductive or obstetric history, third trimester: Secondary | ICD-10-CM | POA: Insufficient documentation

## 2020-12-25 DIAGNOSIS — O30043 Twin pregnancy, dichorionic/diamniotic, third trimester: Secondary | ICD-10-CM | POA: Insufficient documentation

## 2020-12-25 DIAGNOSIS — M545 Low back pain, unspecified: Secondary | ICD-10-CM | POA: Insufficient documentation

## 2020-12-25 DIAGNOSIS — O26893 Other specified pregnancy related conditions, third trimester: Secondary | ICD-10-CM | POA: Diagnosis not present

## 2020-12-25 DIAGNOSIS — O09523 Supervision of elderly multigravida, third trimester: Secondary | ICD-10-CM | POA: Diagnosis not present

## 2020-12-25 DIAGNOSIS — Z79899 Other long term (current) drug therapy: Secondary | ICD-10-CM | POA: Insufficient documentation

## 2020-12-25 DIAGNOSIS — O479 False labor, unspecified: Secondary | ICD-10-CM

## 2020-12-25 DIAGNOSIS — O4703 False labor before 37 completed weeks of gestation, third trimester: Secondary | ICD-10-CM | POA: Diagnosis present

## 2020-12-25 DIAGNOSIS — Z87891 Personal history of nicotine dependence: Secondary | ICD-10-CM | POA: Diagnosis not present

## 2020-12-25 DIAGNOSIS — Z3689 Encounter for other specified antenatal screening: Secondary | ICD-10-CM

## 2020-12-25 LAB — CULTURE, OB URINE

## 2020-12-25 LAB — GLUCOSE, CAPILLARY: Glucose-Capillary: 85 mg/dL (ref 70–99)

## 2020-12-25 MED ORDER — LACTATED RINGERS IV BOLUS
1000.0000 mL | Freq: Once | INTRAVENOUS | Status: AC
  Administered 2020-12-25: 1000 mL via INTRAVENOUS

## 2020-12-25 NOTE — MAU Note (Signed)
SVE unchanged pt discharged to home not in labor at this time. Plan for patient to keep appointment tomorrow and review expectant management plan of care. Babies remain active with reactive NST.

## 2020-12-25 NOTE — Discharge Instructions (Signed)
[employer] ([place]).Twin and Triplet Pregnancy. [place]: [employer] for [place] ([place]); 2019.">  Multiple Pregnancy Multiple pregnancy means that a woman is carrying more than one baby at a time. She may be pregnant with twins, triplets, or more. The majority of multiple pregnancies are twins. Naturally conceiving triplets or more (higher-order multiples) is rare. Multiple pregnancies are riskier than single pregnancies. A woman with a multiple pregnancy is more likely to have certain problems during her pregnancy. How does a multiple pregnancy happen? A multiple pregnancy happens when:  The woman's body releases more than one egg at a time, and then each egg gets fertilized by a different sperm. ? This is the most common type of multiple pregnancy. ? Twins or other multiples produced this way are called fraternal. They are no more alike than non-multiple siblings are.  One sperm fertilizes one egg, which then divides into more than one embryo. ? Twins or other multiples produced this way are called identical. Identical multiples are always the same gender, and they look very much alike.   Who is most likely to have a multiple pregnancy? A multiple pregnancy is more likely to develop in women who:  Have had fertility treatment, especially if the treatment included fertility medicines.  Are older than 36 years of age.  Have already had four or more children.  Have a family history of multiple pregnancy. How is a multiple pregnancy diagnosed? A multiple pregnancy may be diagnosed based on:  Symptoms such as: ? Rapid weight gain in the first 3 months of pregnancy (first trimester). ? More severe nausea and breast tenderness than what is typical of a single pregnancy. ? A larger uterus than what is normal for the stage of the pregnancy.  Blood tests that detect a higher-than-normal level of human chorionic gonadotropin (hCG). This is a hormone that your  body produces in early pregnancy.  An ultrasound exam. This is used to confirm that you are carrying multiples. What risks come with multiple pregnancy? A multiple pregnancy puts you at a higher risk for certain problems during or after your pregnancy. These include:  Delivering your babies before your due date (preterm birth). A full-term pregnancy lasts for at least 37 weeks. ? Babies born before 34 weeks may have a higher risk for breathing problems, feeding difficulties, cerebral palsy, and learning disabilities.  Diabetes.  Preeclampsia. This is a serious condition that causes high blood pressure and headaches during pregnancy.  Too much blood loss after childbirth (postpartum hemorrhage).  Postpartum depression.  Low birth weight of the babies. How will having a multiple pregnancy affect my care? Your health care team will monitor you more closely. You may need more frequent prenatal visits. This will ensure that you are healthy and that your babies are growing normally. Follow these instructions at home: Eating and drinking  Increase your nutrition. ? Follow your health care provider's recommendations for weight gain. You may need to gain a little extra weight when you are pregnant with multiples. ? Eat healthy snacks often throughout the day. This will add calories and reduce nausea.  Drink enough fluid to keep your urine pale yellow.  Take prenatal vitamins. Ask your health care provider what vitamins are right for you. Activity Limit your activities by 20-24 weeks of pregnancy.  Rest often.  Avoid activities, exercise, and work that take a lot of effort.  Ask your health care provider when you should stop having sex. General instructions  Do not  use any products that contain nicotine or tobacco, such as cigarettes, e-cigarettes, and chewing tobacco. If you need help quitting, ask your health care provider.  Do not drink alcohol or use illegal drugs.  Take  over-the-counter and prescription medicines only as told by your health care provider.  Arrange for extra help around the house.  Keep all follow-up visits and all prenatal visits as told by your health care provider. This is important. Where to find more information  [employer]etric[employer]ne[employer]: www.acog.org Contact a health care provider if:  You have dizziness.  You have nausea, vomiting, or diarrhea that does not go away.  You have depression or other emotions that are interfering with your normal activities.  You have a fever.  You have pain with urination.  You have a bad-smelling vaginal discharge.  You notice increased swelling in your face, hands, legs, or ankles. Get help right away if:  You have fluid leaking from your vagina.  You have bleeding from your vagina.  You have pelvic cramps, pelvic pressure, or nagging pain in your abdomen or lower back.  You are having regular contractions.  You have a severe headache, with or without changes in how you see.  You have chest pain or shortness of breath.  You notice that your babies move less often, or do not move at all. Summary  Having a multiple pregnancy means that a woman is carrying more than one baby at a time.  A multiple pregnancy puts you at a higher risk for delivering your babies before your due date, having diabetes, preeclampsia, too much blood loss after childbirth, or low birth weight of the babies.  Your health care provider will monitor you more closely during your pregnancy.  You may need to make some lifestyle changes during pregnancy. This includes eating more, limiting your activities after 20-24 weeks of pregnancy, and arranging for extra help around the house.  Follow up with your health care provider as instructed if you experience any complications. This information is not intended to replace advice given to you by your health care provider. Make sure you discuss  any questions you have with your health care provider. Document Revised: 05/18/2019 Document Reviewed: 05/18/2019 Elsevier Patient Education  [place].

## 2020-12-25 NOTE — MAU Note (Addendum)
Tiffany Hall is a 35 y.o. at [redacted]w[redacted]d here in MAU reporting: contractions for the last 2 days -but today she started feeling vaginal/rectal pressure with contractions and was advised by MD to come for evaluation. Pt is G8P5 with di/di twins. Pt endorses + fetal movement and denies SROM, vaginal bleeding or bloody show. EDC: 01/26/2021  Onset of complaint: vaginal rectal pressure started yesterday  Pain score: 8 There were no vitals filed for this visit.   FHT: Twin A:135           Twin B:140

## 2020-12-25 NOTE — MAU Note (Signed)
Babies remain very active and difficult to maintain continuous tracing. RN remains at bedside adjusting monitors.  Bedside glucose 85.

## 2020-12-25 NOTE — MAU Note (Signed)
Pt assisted to bathroom

## 2020-12-25 NOTE — MAU Provider Note (Signed)
History     CSN: [id number]  Arrival date and time: 12/25/20 1450   Event Date/Time   First Provider Initiated Contact with Patient 12/25/20 1730      Chief Complaint  Patient presents with  . Back Pain    L side  . Contractions    X2 days- with vaginal rectal pressure starting today   HPI Tiffany Hall is a 35 y.o. [id number] at 35w3dwith di-di twins who presents to MAU wth chief complaint of lower abdominal contractions and lower back pain. These are recurrent problems for which patient was evaluated in MAU on 12/23/2020. She states her pain is unchanged since that time. Her pain score is 6/10 in her lower abdomen and 5/10 at the left margin of her rib cage. She declines pain medication.  She denies vaginal bleeding, leaking of fluid, decreased fetal movement, fever, falls, or recent illness.   She receives care with [facility] her next appointment is tomorrow.  OB History    Gravida  8   Para  5   Term  5   Preterm  0   AB  2   Living  5     SAB  2   IAB  0   Ectopic  0   Multiple  0   Live Births  3           Past Medical History:  Diagnosis Date  . Anemia   . Anxiety   . Depression   . GERD (gastroesophageal reflux disease)    medication not needed  . Heart murmur   . History of abnormal cervical Pap smear    2009  . History of cardiac murmur as a child   . History of chlamydia    2008  . History of gallstones    2012  . History of gonorrhea    2008  . History of ovarian cyst   . History of trichomonal vaginitis    2008  . Hypertension    PIH with pregnancy  . Migraine headache   . Preeclampsia in postpartum period 01/15/2015  . [place] trait [place]   . Vaginal Pap smear, abnormal    HPV +    Past Surgical History:  Procedure Laterality Date  . COLPOSCOPY    . dilatation and currettage  N/A   . DILATION AND CURETTAGE OF UTERUS  2009   W/  SUCTION --  required blood transfusion  . DILATION AND EVACUATION N/A 09/14/2015    Procedure: DILATATION AND EVACUATION;  Surgeon: MLinda Hedges DO;  [facility];  Service: Gynecology;  Laterality: N/A;    Family History  Problem Relation Age of Onset  . Diabetes Mother   . Hypertension Mother   . Hepatitis Mother   . Cancer Mother 267      Breast, cervical  . Diabetes Maternal Grandmother   . Hypertension Maternal Grandmother   . Cancer Maternal Grandmother 40       Breast  . Anesthesia problems Neg Hx   . Other Neg Hx     Social History   Tobacco Use  . Smoking status: Former Smoker    Packs/day: 0.00    Types: Cigarettes    Quit date: 11/05/2010    Years since quitting: 10.1  . Smokeless tobacco: Never Used  . Tobacco comment: quit with preg  Vaping Use  . Vaping Use: Never used  Substance Use Topics  . Alcohol use: No  Comment: occ  . Drug use: No    Types: MDMA (Ecstacy)    Comment: prior to this pregnancy in 2009    Allergies:  Allergies  Allergen Reactions  . Amoxicillin Swelling    Did it involve swelling of the face/tongue/throat, SOB, or low BP? No Did it involve sudden or severe rash/hives, skin peeling, or any reaction on the inside of your mouth or nose? No Did you need to seek medical attention at a hospital or doctor's office? No When did it last happen?last year If all above answers are "NO", may proceed with cephalosporin use.   . Nubain [Nalbuphine Hcl] Swelling, Palpitations and Other (See Comments)    Pt states that she is able to take Percocet without any problems.       Medications Prior to Admission  Medication Sig Dispense Refill Last Dose  . acetaminophen (TYLENOL) 500 MG tablet Take 1,000 mg by mouth every 6 (six) hours as needed for mild pain or headache.   Past Week at Unknown time  . aspirin EC 81 MG tablet Take 1 tablet (81 mg total) by mouth daily. Take after 12 weeks for prevention of preeclampsia later in pregnancy 300 tablet 2 Past Week at Unknown time  . docusate sodium (COLACE) 100 MG capsule  Take 100 mg by mouth 2 (two) times daily.   Past Week at Unknown time  . ferrous sulfate 325 (65 FE) MG tablet Take 1 tablet (325 mg total) by mouth 2 (two) times daily with a meal. 60 tablet 5 Past Week at Unknown time  . insulin aspart (NOVOLOG) 100 UNIT/ML FlexPen Inject 10 Units into the skin 3 (three) times daily with meals. 24 each 11 Past Week at Unknown time  . metFORMIN (GLUCOPHAGE) 500 MG tablet Take 500 mg by mouth 2 (two) times daily.   Past Week at Unknown time  . Prenatal Vit-Fe Fumarate-FA (PRENATAL MULTIVITAMIN) TABS tablet Take 1 tablet by mouth daily at 12 noon.   Past Week at Unknown time  . Accu-Chek Softclix Lancets lancets Use as instructed 100 each 12   . Blood Glucose Monitoring Suppl (ACCU-CHEK GUIDE) w/Device KIT 1 Device by Does not apply route 4 (four) times daily. 1 kit 0   . Blood Pressure Monitoring (BLOOD PRESSURE KIT) DEVI 1 kit by Does not apply route once a week. Check Blood Pressure regularly and record readings into the Babyscripts App.  Large Cuff.  DX O90.0 (Patient taking differently: 1 kit by Does not apply route once a week. Check Blood Pressure regularly and record readings into the Babyscripts App.  Large Cuff.  DX O90.0) 1 each 0   . glucose blood (ACCU-CHEK GUIDE) test strip Use to check blood sugars four times a day was instructed 50 each 12   . insulin glargine (LANTUS) 100 unit/mL SOPN Inject 40 Units into the skin daily. (Patient not taking: Reported on 12/19/2020) 18 each 12   . terconazole (TERAZOL 7) 0.4 % vaginal cream Place 1 applicator vaginally at bedtime. 45 g 0     Review of Systems  Gastrointestinal: Positive for abdominal pain.  Musculoskeletal: Positive for back pain.  All other systems reviewed and are negative.  Physical Exam   Blood pressure 123/69, pulse 80, temperature 98.5 F (36.9 C), temperature source Oral, resp. rate 17, height 5' 4" (1.626 m), weight 92.5 kg, last menstrual period 04/21/2020, SpO2 99 %.  Physical  Exam Vitals and nursing note reviewed. Exam conducted with a chaperone present.  Constitutional:  Appearance: Normal appearance.  Cardiovascular:     Rate and Rhythm: Normal rate.     Pulses: Normal pulses.     Heart sounds: Normal heart sounds.  Pulmonary:     Effort: Pulmonary effort is normal.     Breath sounds: Normal breath sounds.  Abdominal:     Comments: Gravid  Skin:    Capillary Refill: Capillary refill takes less than 2 seconds.  Neurological:     Mental Status: She is oriented to person, place, and time.  Psychiatric:        Mood and Affect: Mood normal.        Behavior: Behavior normal.        Thought Content: Thought content normal.        Judgment: Judgment normal.     MAU Course  Procedures   --Reactive tracing x 2: --Baby A: baseline: 140, mod var, + 15 x 15 accels, no decels --Baby B: baseline: 120, mod var, + 15 x 15 accels, no decels --Toco: Irregular ctx q 0-16 min --Cephalic/Cephalic by BSUS --Discussed with Dr. Harolyn Rutherford, no further intervention indicated at this time  Orders Placed This Encounter  Procedures  . Glucose, capillary  . Contraction - monitoring  . External fetal heart monitoring  . Vaginal exam  . Insert peripheral IV  . Discharge patient   Meds ordered this encounter  Medications  . lactated ringers bolus 1,000 mL   Assessment and Plan  --35 y.o. [id number] at [redacted]w[redacted]d --Reactive tracing x 2 --Cervix unchanged from 12/23/2020 --No cervical change in two hours of monitoring in MAU --Declines pain medication --Discharge home in stable condition with labor precautions  F[facility]U: --[facility] SD[facility]ina Rumpf3/20/2022, 7:39 PM

## 2020-12-25 NOTE — MAU Note (Signed)
CNM in bedside ultrasound for presentation

## 2020-12-26 ENCOUNTER — Ambulatory Visit (INDEPENDENT_AMBULATORY_CARE_PROVIDER_SITE_OTHER): Payer: Medicare Other | Admitting: Obstetrics & Gynecology

## 2020-12-26 ENCOUNTER — Ambulatory Visit: Payer: No Typology Code available for payment source

## 2020-12-26 ENCOUNTER — Ambulatory Visit (HOSPITAL_BASED_OUTPATIENT_CLINIC_OR_DEPARTMENT_OTHER): Payer: Medicare Other

## 2020-12-26 ENCOUNTER — Ambulatory Visit: Payer: Medicare Other | Attending: Obstetrics and Gynecology | Admitting: *Deleted

## 2020-12-26 ENCOUNTER — Encounter: Payer: Self-pay | Admitting: *Deleted

## 2020-12-26 VITALS — BP 124/82 | HR 81 | Wt 209.0 lb

## 2020-12-26 DIAGNOSIS — O24113 Pre-existing diabetes mellitus, type 2, in pregnancy, third trimester: Secondary | ICD-10-CM | POA: Insufficient documentation

## 2020-12-26 DIAGNOSIS — Z3A35 35 weeks gestation of pregnancy: Secondary | ICD-10-CM | POA: Insufficient documentation

## 2020-12-26 DIAGNOSIS — O24319 Unspecified pre-existing diabetes mellitus in pregnancy, unspecified trimester: Secondary | ICD-10-CM

## 2020-12-26 DIAGNOSIS — E669 Obesity, unspecified: Secondary | ICD-10-CM | POA: Insufficient documentation

## 2020-12-26 DIAGNOSIS — O99210 Obesity complicating pregnancy, unspecified trimester: Secondary | ICD-10-CM

## 2020-12-26 DIAGNOSIS — E119 Type 2 diabetes mellitus without complications: Secondary | ICD-10-CM

## 2020-12-26 DIAGNOSIS — O30049 Twin pregnancy, dichorionic/diamniotic, unspecified trimester: Secondary | ICD-10-CM

## 2020-12-26 DIAGNOSIS — O99213 Obesity complicating pregnancy, third trimester: Secondary | ICD-10-CM

## 2020-12-26 DIAGNOSIS — Z148 Genetic carrier of other disease: Secondary | ICD-10-CM

## 2020-12-26 DIAGNOSIS — O30043 Twin pregnancy, dichorionic/diamniotic, third trimester: Secondary | ICD-10-CM

## 2020-12-26 DIAGNOSIS — O09523 Supervision of elderly multigravida, third trimester: Secondary | ICD-10-CM | POA: Diagnosis not present

## 2020-12-26 DIAGNOSIS — B009 Herpesviral infection, unspecified: Secondary | ICD-10-CM

## 2020-12-26 DIAGNOSIS — Z362 Encounter for other antenatal screening follow-up: Secondary | ICD-10-CM

## 2020-12-26 DIAGNOSIS — O09299 Supervision of pregnancy with other poor reproductive or obstetric history, unspecified trimester: Secondary | ICD-10-CM

## 2020-12-26 DIAGNOSIS — O0993 Supervision of high risk pregnancy, unspecified, third trimester: Secondary | ICD-10-CM

## 2020-12-26 DIAGNOSIS — O24313 Unspecified pre-existing diabetes mellitus in pregnancy, third trimester: Secondary | ICD-10-CM

## 2020-12-26 MED ORDER — VALACYCLOVIR HCL 500 MG PO TABS: 500.0000 mg | ORAL_TABLET | Freq: Every day | ORAL | 0 refills | Status: AC

## 2020-12-26 NOTE — Patient Instructions (Signed)

## 2020-12-26 NOTE — Progress Notes (Signed)
° °  PRENATAL VISIT NOTE  Subjective:  Tiffany Hall is a 35 y.o. [id number] at [redacted]w[redacted]d being seen today for ongoing prenatal care.  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  Patient reports occasional contractions.  Contractions: Irritability. Vag. Bleeding: None.  Movement: Present. Denies leaking of fluid.   The following portions of the patient's history were reviewed and updated as appropriate: allergies, current medications, past family history, past medical history, past social history, past surgical history and problem list.   Objective:   Vitals:   12/26/20 0832  BP: 124/82  Pulse: 81  Weight: 209 lb (94.8 kg)    Fetal Status: Fetal Heart Rate (bpm): 138/141   Movement: Present     General:  Alert, oriented and cooperative. Patient is in no acute distress.  Skin: Skin is warm and dry. No rash noted.   Cardiovascular: Normal heart rate noted  Respiratory: Normal respiratory effort, no problems with respiration noted  Abdomen: Soft, gravid, appropriate for gestational age.  Pain/Pressure: Absent     Pelvic: Cervical exam deferred        Extremities: Normal range of motion.  Edema: Trace  Mental Status: Normal mood and affect. Normal behavior. Normal judgment and thought content.   Assessment and Plan:  Pregnancy: [id number] at [redacted]w[redacted]d 1. Dichorionic diamniotic twin pregnancy, antepartum HSV seropositive and h/o lesion - valACYclovir (VALTREX) 500 MG tablet; Take 1 tablet (500 mg total) by mouth daily.   Dispense: 30 tablet; Refill: 0  2. Pre-existing diabetes mellitus affecting pregnancy, antepartum suboptimal control, eats only 1/day usually, BG up to 130s using Novolog at meal  Preterm labor symptoms and general obstetric precautions including but not limited to vaginal bleeding, contractions, leaking of fluid and fetal movement were reviewed in detail with the patient. Please refer to After Visit Summary for other counseling recommendations.   Return in about 1 week (around 01/02/2021).  Future Appointments  Date Time Provider [place]  12/26/2020  9:15 AM [facility] NURSE [facility]  12/26/2020  9:30 AM [facility] US3 [facility]  01/02/2021  9:00 AM [facility] NURSE [facility]  01/02/2021  9:15 AM [facility] US2 [facility]    Emeterio Reeve, MD

## 2020-12-26 NOTE — Progress Notes (Signed)
Urine looks good today

## 2020-12-27 ENCOUNTER — Telehealth: Payer: Self-pay | Admitting: Obstetrics & Gynecology

## 2020-12-29 ENCOUNTER — Inpatient Hospital Stay (HOSPITAL_COMMUNITY)
Admission: AD | Admit: 2020-12-29 | Discharge: 2020-12-29 | Disposition: A | Discharge status: Home / Self Care | Payer: Medicare Other | Attending: Obstetrics & Gynecology | Admitting: Obstetrics & Gynecology

## 2020-12-29 ENCOUNTER — Other Ambulatory Visit: Payer: Self-pay

## 2020-12-29 DIAGNOSIS — O26893 Other specified pregnancy related conditions, third trimester: Secondary | ICD-10-CM | POA: Diagnosis not present

## 2020-12-29 DIAGNOSIS — O09523 Supervision of elderly multigravida, third trimester: Secondary | ICD-10-CM | POA: Insufficient documentation

## 2020-12-29 DIAGNOSIS — D573 Sickle-cell trait: Secondary | ICD-10-CM | POA: Insufficient documentation

## 2020-12-29 DIAGNOSIS — I10 Essential (primary) hypertension: Secondary | ICD-10-CM

## 2020-12-29 DIAGNOSIS — Z3A36 36 weeks gestation of pregnancy: Secondary | ICD-10-CM

## 2020-12-29 DIAGNOSIS — Z87891 Personal history of nicotine dependence: Secondary | ICD-10-CM | POA: Insufficient documentation

## 2020-12-29 DIAGNOSIS — R519 Headache, unspecified: Secondary | ICD-10-CM | POA: Diagnosis present

## 2020-12-29 DIAGNOSIS — R030 Elevated blood-pressure reading, without diagnosis of hypertension: Secondary | ICD-10-CM | POA: Insufficient documentation

## 2020-12-29 DIAGNOSIS — O99353 Diseases of the nervous system complicating pregnancy, third trimester: Secondary | ICD-10-CM | POA: Diagnosis not present

## 2020-12-29 LAB — CBC
HCT: 27 % — ABNORMAL LOW (ref 36.0–46.0)
Hemoglobin: 9.4 g/dL — ABNORMAL LOW (ref 12.0–15.0)
MCH: 26.4 pg (ref 26.0–34.0)
MCHC: 34.8 g/dL (ref 30.0–36.0)
MCV: 75.8 fL — ABNORMAL LOW (ref 80.0–100.0)
Platelets: 288 10*3/uL (ref 150–400)
RBC: 3.56 MIL/uL — ABNORMAL LOW (ref 3.87–5.11)
RDW: 15.2 % (ref 11.5–15.5)
WBC: 6.2 10*3/uL (ref 4.0–10.5)
nRBC: 0 % (ref 0.0–0.2)

## 2020-12-29 LAB — URINALYSIS, ROUTINE W REFLEX MICROSCOPIC
Bilirubin Urine: NEGATIVE
Glucose, UA: NEGATIVE mg/dL
Ketones, ur: NEGATIVE mg/dL
Leukocytes,Ua: NEGATIVE
Nitrite: NEGATIVE
Protein, ur: NEGATIVE mg/dL
Specific Gravity, Urine: 1.004 — ABNORMAL LOW (ref 1.005–1.030)
pH: 7 (ref 5.0–8.0)

## 2020-12-29 LAB — COMPREHENSIVE METABOLIC PANEL
ALT: 12 U/L (ref 0–44)
AST: 21 U/L (ref 15–41)
Albumin: 2.4 g/dL — ABNORMAL LOW (ref 3.5–5.0)
Alkaline Phosphatase: 173 U/L — ABNORMAL HIGH (ref 38–126)
Anion gap: 7 (ref 5–15)
BUN: <5 mg/dL — ABNORMAL LOW (ref 6–20)
CO2: 19 mmol/L — ABNORMAL LOW (ref 22–32)
Calcium: 9 mg/dL (ref 8.9–10.3)
Chloride: 107 mmol/L (ref 98–111)
Creatinine, Ser: 0.52 mg/dL (ref 0.44–1.00)
GFR, Estimated: >60 mL/min (ref >60)
Glucose, Bld: 105 mg/dL — ABNORMAL HIGH (ref 70–99)
Potassium: 3.8 mmol/L (ref 3.5–5.1)
Sodium: 133 mmol/L — ABNORMAL LOW (ref 135–145)
Total Bilirubin: 0.3 mg/dL (ref 0.3–1.2)
Total Protein: 5.9 g/dL — ABNORMAL LOW (ref 6.5–8.1)

## 2020-12-29 LAB — PROTEIN / CREATININE RATIO, URINE
Creatinine, Urine: 38.3 mg/dL
Total Protein, Urine: <6 mg/dL

## 2020-12-29 MED ORDER — BUTALBITAL-APAP-CAFFEINE 50-325-40 MG PO TABS: 1.0000 | ORAL_TABLET | Freq: Four times a day (QID) | ORAL | 0 refills | Status: DC | PRN

## 2020-12-29 MED ORDER — BUTALBITAL-APAP-CAFFEINE 50-325-40 MG PO TABS
2.0000 | ORAL_TABLET | Freq: Once | ORAL | Status: AC
  Administered 2020-12-29: 2 via ORAL
  Filled 2020-12-29: qty 2

## 2020-12-29 NOTE — Discharge Instructions (Signed)

## 2020-12-29 NOTE — MAU Provider Note (Signed)
Chief Complaint:  Headache   Event Date/Time   First Provider Initiated Contact with Patient 12/29/20 0400     HPI: Tiffany Hall is a 35 y.o. [id number] at 97w0dho presents to maternity admissions reporting headache since yesterday morning and elevated blood pressure at home. Also seeing spots in vision.  Intermittent contractions. Wants cervix checked. . She reports good fetal movement, denies LOF, vaginal bleeding, vaginal itching/burning, urinary symptoms, dizziness, n/v, diarrhea, constipation or fever/chills.  She denies RUQ abdominal pain.  Headache  This is a new problem. The current episode started yesterday. The problem occurs constantly. The problem has been unchanged. The quality of the pain is described as aching and dull. The pain is at a severity of 8/10. Associated symptoms include abdominal pain (intermittent contractions) and a visual change. Pertinent negatives include no blurred vision (but seeing spots), fever, muscle aches, nausea, sinus pressure, vomiting or weakness. Nothing aggravates the symptoms. She has tried acetaminophen (Took at 7p, (10 hrs ago)) for the symptoms. The treatment provided no relief.    RN Note: Tiffany ESGUERRAis a 35y.o. at 335w0dere in MAU reporting: Headache and visual changes. She began seeing black floating spots. She took her blood pressure was 160/98. Denies vaginal bleeding or leaking of fluid. +FM. Pain score: 8/10  Past Medical History: Past Medical History:  Diagnosis Date  . Anemia   . Anxiety   . Depression   . GERD (gastroesophageal reflux disease)    medication not needed  . Heart murmur   . History of abnormal cervical Pap smear    2009  . History of cardiac murmur as a child   . History of chlamydia    2008  . History of gallstones    2012  . History of gonorrhea    2008  . History of ovarian cyst   . History of trichomonal vaginitis    2008  . Hypertension    PIH with pregnancy  . Migraine headache   .  Preeclampsia in postpartum period 01/15/2015  . [place]  . Vaginal Pap smear, abnormal    HPV +    Past obstetric history: OB History  Gravida Para Term Preterm AB Living  8 5 5  0 2 5  SAB IAB Ectopic Multiple Live Births  2 0 0 0 3    # Outcome Date GA Lbr Len/2nd Weight Sex Delivery Anes PTL Lv  8 Current           7 Term 01/10/15 3957w2d:41 / 00:06 3275 g M [address]  LIV  6 SAB 2016          5 Term 05/22/12 39w88w2d23 / 00:14 3140 g F Vag-Spont EPI  LIV  4 SAB 2009          3 Term 01/04/07 40w070w0d0 2920 g F Vag-Spont EPI N   2 Term 07/01/05 53w0d58w0d 3374 g F Vag-Spont EPI Y LIV  1 Term 03/04/04 [redacted]w[redacted]d 74w0d3260 g F Vag-Spont EPI N     Past Surgical History: Past Surgical History:  Procedure Laterality Date  . COLPOSCOPY    . dilatation and currettage  N/A   . DILATION AND CURETTAGE OF UTERUS  2009   W/  SUCTION --  required blood transfusion  . DILATION AND EVACUATION N/A 09/14/2015   Procedure: DILATATION AND EVACUATION;  Surgeon: Megan MLinda HedgesLocation: [facility];  Laterality: N/A;    Family  History: Family History  Problem Relation Age of Onset  . Diabetes Mother   . Hypertension Mother   . Hepatitis Mother   . Cancer Mother 15       Breast, cervical  . Diabetes Maternal Grandmother   . Hypertension Maternal Grandmother   . Cancer Maternal Grandmother 40       Breast  . Anesthesia problems Neg Hx   . Other Neg Hx     Social History: Social History   Tobacco Use  . Smoking status: Former Smoker    Packs/day: 0.00    Types: Cigarettes    Quit date: 11/05/2010    Years since quitting: 10.1  . Smokeless tobacco: Never Used  . Tobacco comment: quit with preg  Vaping Use  . Vaping Use: Never used  Substance Use Topics  . Alcohol use: No    Comment: occ  . Drug use: No    Types: MDMA (Ecstacy)    Comment: prior to this pregnancy in 2009    Allergies:  Allergies  Allergen Reactions  .  Amoxicillin Swelling    Did it involve swelling of the face/tongue/throat, SOB, or low BP? No Did it involve sudden or severe rash/hives, skin peeling, or any reaction on the inside of your mouth or nose? No Did you need to seek medical attention at a hospital or doctor's office? No When did it last happen?last year If all above answers are "NO", may proceed with cephalosporin use.   . Nubain [Nalbuphine Hcl] Swelling, Palpitations and Other (See Comments)    Pt states that she is able to take Percocet without any problems.       Meds:  Medications Prior to Admission  Medication Sig Dispense Refill Last Dose  . acetaminophen (TYLENOL) 500 MG tablet Take 1,000 mg by mouth every 6 (six) hours as needed for mild pain or headache.   12/28/2020 at Unknown time  . aspirin EC 81 MG tablet Take 1 tablet (81 mg total) by mouth daily. Take after 12 weeks for prevention of preeclampsia later in pregnancy 300 tablet 2 12/28/2020 at Unknown time  . insulin aspart (NOVOLOG) 100 UNIT/ML FlexPen Inject 10 Units into the skin 3 (three) times daily with meals. 24 each 11 12/28/2020 at Unknown time  . Prenatal Vit-Fe Fumarate-FA (PRENATAL MULTIVITAMIN) TABS tablet Take 1 tablet by mouth daily at 12 noon.   12/28/2020 at Unknown time  . valACYclovir (VALTREX) 500 MG tablet Take 1 tablet (500 mg total) by mouth daily. 30 tablet 0 12/28/2020 at Unknown time  . Accu-Chek Softclix Lancets lancets Use as instructed 100 each 12   . Blood Glucose Monitoring Suppl (ACCU-CHEK GUIDE) w/Device KIT 1 Device by Does not apply route 4 (four) times daily. 1 kit 0   . Blood Pressure Monitoring (BLOOD PRESSURE KIT) DEVI 1 kit by Does not apply route once a week. Check Blood Pressure regularly and record readings into the Babyscripts App.  Large Cuff.  DX O90.0 (Patient taking differently: 1 kit by Does not apply route once a week. Check Blood Pressure regularly and record readings into the Babyscripts App.  Large Cuff.  DX O90.0)  1 each 0   . docusate sodium (COLACE) 100 MG capsule Take 100 mg by mouth 2 (two) times daily.     . ferrous sulfate 325 (65 FE) MG tablet Take 1 tablet (325 mg total) by mouth 2 (two) times daily with a meal. 60 tablet 5   . glucose blood (ACCU-CHEK GUIDE) test  strip Use to check blood sugars four times a day was instructed 50 each 12   . insulin glargine (LANTUS) 100 unit/mL SOPN Inject 40 Units into the skin daily. 18 each 12   . metFORMIN (GLUCOPHAGE) 500 MG tablet Take 500 mg by mouth 2 (two) times daily. (Patient not taking: Reported on 12/26/2020)     . terconazole (TERAZOL 7) 0.4 % vaginal cream Place 1 applicator vaginally at bedtime. (Patient not taking: No sig reported) 45 g 0     I have reviewed patient's Past Medical Hx, Surgical Hx, Family Hx, Social Hx, medications and allergies.   ROS:  Review of Systems  Constitutional: Negative for fever.  HENT: Negative for sinus pressure.   Eyes: Negative for blurred vision (but seeing spots).  Gastrointestinal: Positive for abdominal pain (intermittent contractions). Negative for nausea and vomiting.  Neurological: Positive for headaches. Negative for weakness.   Other systems negative  Physical Exam  131/70 117/65 119/63  Vitals:   12/29/20 0421 12/29/20 0425 12/29/20 0430 12/29/20 0431  BP: 116/66   125/78  Pulse: 70   75  Resp:      Temp:      TempSrc:      SpO2:  99% 99%   Weight:      Height:       Constitutional: Well-developed, well-nourished female in no acute distress.  Cardiovascular: normal rate and rhythm Respiratory: normal effort, clear to auscultation bilaterally GI: Abd soft, non-tender, gravid appropriate for gestational age.   No rebound or guarding. MS: Extremities nontender, no edema (states has edema end of day), normal ROM Neurologic: Alert and oriented x 4. DTRs 3+, no clonus GU: Neg CVAT.  PELVIC EXAM:    Dilation: 3 Effacement (%): 80 Station: Ballotable Presentation: Vertex Exam by::  Therisa Doyne, RN  FHT:  Baseline 140/145 , moderate variability, accelerations present, no decelerations  Both twins very reactive Contractions:  Irregular     Labs: Results for orders placed or performed during the hospital encounter of 12/29/20 (from the past 24 hour(s))  Urinalysis, Routine w reflex microscopic Urine, Clean Catch     Status: Abnormal   Collection Time: 12/29/20  3:58 AM  Result Value Ref Range   Color, Urine YELLOW YELLOW   APPearance HAZY (A) CLEAR   Specific Gravity, Urine 1.004 (L) 1.005 - 1.030   pH 7.0 5.0 - 8.0   Glucose, UA NEGATIVE NEGATIVE mg/dL   Hgb urine dipstick SMALL (A) NEGATIVE   Bilirubin Urine NEGATIVE NEGATIVE   Ketones, ur NEGATIVE NEGATIVE mg/dL   Protein, ur NEGATIVE NEGATIVE mg/dL   Nitrite NEGATIVE NEGATIVE   Leukocytes,Ua NEGATIVE NEGATIVE   RBC / HPF 0-5 0 - 5 RBC/hpf   WBC, UA 0-5 0 - 5 WBC/hpf   Bacteria, UA RARE (A) NONE SEEN   Squamous Epithelial / LPF 0-5 0 - 5  Protein / creatinine ratio, urine     Status: None   Collection Time: 12/29/20  3:58 AM  Result Value Ref Range   Creatinine, Urine 38.30 mg/dL   Total Protein, Urine <6 mg/dL   Protein Creatinine Ratio        0.00 - 0.15 mg/mg[Cre]  CBC     Status: Abnormal   Collection Time: 12/29/20  4:53 AM  Result Value Ref Range   WBC 6.2 4.0 - 10.5 K/uL   RBC 3.56 (L) 3.87 - 5.11 MIL/uL   Hemoglobin 9.4 (L) 12.0 - 15.0 g/dL   HCT 27.0 (L) 36.0 - 46.0 %  MCV 75.8 (L) 80.0 - 100.0 fL   MCH 26.4 26.0 - 34.0 pg   MCHC 34.8 30.0 - 36.0 g/dL   RDW 15.2 11.5 - 15.5 %   Platelets 288 150 - 400 K/uL   nRBC 0.0 0.0 - 0.2 %  Comprehensive metabolic panel     Status: Abnormal   Collection Time: 12/29/20  4:53 AM  Result Value Ref Range   Sodium 133 (L) 135 - 145 mmol/L   Potassium 3.8 3.5 - 5.1 mmol/L   Chloride 107 98 - 111 mmol/L   CO2 19 (L) 22 - 32 mmol/L   Glucose, Bld 105 (H) 70 - 99 mg/dL   BUN <5 (L) 6 - 20 mg/dL   Creatinine, Ser 0.52 0.44 - 1.00 mg/dL   Calcium  9.0 8.9 - 10.3 mg/dL   Total Protein 5.9 (L) 6.5 - 8.1 g/dL   Albumin 2.4 (L) 3.5 - 5.0 g/dL   AST 21 15 - 41 U/L   ALT 12 0 - 44 U/L   Alkaline Phosphatase 173 (H) 38 - 126 U/L   Total Bilirubin 0.3 0.3 - 1.2 mg/dL   GFR, Estimated >60 >60 mL/min   Anion gap 7 5 - 15    --/--/O POS (02/22 2115)  Imaging:    MAU Course/MDM: I have ordered labs and reviewed results, even though she is not hypertensive here. Labs are normal  Elevations in Transaminases have resolved to normal now NST reviewed, reactive with irregular UCs Consult Dr Harolyn Rutherford with presentation, exam findings and test results.  Treatments in MAU included EFM. And Fioricet for headache which did resolve it    Assessment: Single IUP at 31w0dHeadache, likely migraine Elevated BP at home, entirely normotensive here Normal labs Resolved elevation in transaminases  Plan: Discharge home Labor precautions and fetal kick counts Follow up in Office for prenatal visits   Encouraged to return if she develops worsening of symptoms, increase in pain, fever, or other concerning symptoms.  Pt stable at time of discharge.  MHansel FeinsteinCNM, MSN Certified Nurse-Midwife 12/29/2020 4:01 AM

## 2020-12-29 NOTE — MAU Note (Signed)
..  Tiffany Hall is a 35 y.o. at [redacted]w[redacted]d here in MAU reporting: Headache and visual changes. She began seeing black floating spots. She took her blood pressure was 160/98. Denies vaginal bleeding or leaking of fluid. +FM. Pain score: 8/10

## 2021-01-02 ENCOUNTER — Other Ambulatory Visit: Payer: Self-pay

## 2021-01-02 ENCOUNTER — Encounter: Payer: Self-pay | Admitting: *Deleted

## 2021-01-02 ENCOUNTER — Ambulatory Visit (INDEPENDENT_AMBULATORY_CARE_PROVIDER_SITE_OTHER): Payer: Medicare Other | Admitting: Obstetrics and Gynecology

## 2021-01-02 ENCOUNTER — Ambulatory Visit: Payer: Medicare Other | Admitting: *Deleted

## 2021-01-02 ENCOUNTER — Encounter (HOSPITAL_COMMUNITY): Payer: Self-pay | Admitting: *Deleted

## 2021-01-02 ENCOUNTER — Telehealth (HOSPITAL_COMMUNITY): Payer: Self-pay | Admitting: *Deleted

## 2021-01-02 ENCOUNTER — Ambulatory Visit (HOSPITAL_BASED_OUTPATIENT_CLINIC_OR_DEPARTMENT_OTHER): Payer: Medicare Other

## 2021-01-02 ENCOUNTER — Other Ambulatory Visit (HOSPITAL_COMMUNITY)
Admission: RE | Admit: 2021-01-02 | Discharge: 2021-01-02 | Disposition: A | Discharge status: Home / Self Care | Payer: Medicare Other | Source: Ambulatory Visit | Attending: Obstetrics and Gynecology | Admitting: Obstetrics and Gynecology

## 2021-01-02 ENCOUNTER — Encounter: Payer: Self-pay | Admitting: Obstetrics and Gynecology

## 2021-01-02 VITALS — BP 124/79 | HR 89 | Wt 209.0 lb

## 2021-01-02 DIAGNOSIS — O30049 Twin pregnancy, dichorionic/diamniotic, unspecified trimester: Secondary | ICD-10-CM

## 2021-01-02 DIAGNOSIS — O24313 Unspecified pre-existing diabetes mellitus in pregnancy, third trimester: Secondary | ICD-10-CM | POA: Diagnosis not present

## 2021-01-02 DIAGNOSIS — O0993 Supervision of high risk pregnancy, unspecified, third trimester: Secondary | ICD-10-CM

## 2021-01-02 DIAGNOSIS — E669 Obesity, unspecified: Secondary | ICD-10-CM | POA: Diagnosis not present

## 2021-01-02 DIAGNOSIS — O09523 Supervision of elderly multigravida, third trimester: Secondary | ICD-10-CM

## 2021-01-02 DIAGNOSIS — O24319 Unspecified pre-existing diabetes mellitus in pregnancy, unspecified trimester: Secondary | ICD-10-CM | POA: Insufficient documentation

## 2021-01-02 DIAGNOSIS — O99210 Obesity complicating pregnancy, unspecified trimester: Secondary | ICD-10-CM

## 2021-01-02 DIAGNOSIS — B009 Herpesviral infection, unspecified: Secondary | ICD-10-CM

## 2021-01-02 DIAGNOSIS — Z148 Genetic carrier of other disease: Secondary | ICD-10-CM

## 2021-01-02 DIAGNOSIS — Z794 Long term (current) use of insulin: Secondary | ICD-10-CM

## 2021-01-02 DIAGNOSIS — O99213 Obesity complicating pregnancy, third trimester: Secondary | ICD-10-CM

## 2021-01-02 DIAGNOSIS — O30043 Twin pregnancy, dichorionic/diamniotic, third trimester: Secondary | ICD-10-CM | POA: Diagnosis not present

## 2021-01-02 DIAGNOSIS — O09299 Supervision of pregnancy with other poor reproductive or obstetric history, unspecified trimester: Secondary | ICD-10-CM

## 2021-01-02 DIAGNOSIS — O321XX2 Maternal care for breech presentation, fetus 2: Secondary | ICD-10-CM

## 2021-01-02 DIAGNOSIS — Z3A36 36 weeks gestation of pregnancy: Secondary | ICD-10-CM

## 2021-01-02 NOTE — Telephone Encounter (Signed)
Preadmission screen  

## 2021-01-02 NOTE — Progress Notes (Signed)
+   Fetal movement. Pt has blood sugar logs to review.

## 2021-01-02 NOTE — Progress Notes (Signed)
   PRENATAL VISIT NOTE  Subjective:  BONITA BRINDISI is a 35 y.o. [id number] at [redacted]w[redacted]d being seen today for ongoing prenatal care.  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  Patient reports no complaints.  Contractions: Irregular. Vag. Bleeding: None.  Movement: Present. Denies leaking of fluid.   The following portions of the patient's history were reviewed and updated as appropriate: allergies, current medications, past family history, past medical history, past social history, past surgical history and problem list.   Objective:   Vitals:   01/02/21 1039  BP: 124/79  Pulse: 89  Weight: 209 lb (94.8 kg)    Fetal Status: Fetal Heart Rate (bpm): 147/150   Movement: Present     General:  Alert, oriented and cooperative. Patient is in no acute distress.  Skin: Skin is warm and dry. No rash noted.   Cardiovascular: Normal heart rate noted  Respiratory: Normal respiratory effort, no problems with respiration noted  Abdomen: Soft, gravid, appropriate for gestational age.  Pain/Pressure: Present     Pelvic: Cervical exam performed in the presence of a chaperone Dilation: 2.5 Effacement (%): 50 Station: -3  Extremities: Normal range of motion.  Edema: Trace  Mental Status: Normal mood and affect. Normal behavior. Normal judgment and thought content.   Assessment and Plan:  Pregnancy: [id number] at [redacted]w[redacted]d 1. Supervision of high risk pregnancy in third trimester Patient is doing well without complaints Vaginal swab  today  2. Pre-existing diabetes mellitus affecting pregnancy, antepartum CBGs reviewed and still poorly controlled. Patient is not always fasting and patient is eating 1 meal a day Poorly controlled Type 2 DM- MFM recommended delivery at 37 weeks. Orders places and IOL scheduled for 3/31  3. History of pre-eclampsia in prior pregnancy, currently pregnant Normotensive and asymptomatic  4. Dichorionic diamniotic twin pregnancy, antepartum Normal BPP today x 2  5. Maternal obesity affecting pregnancy, antepartum Continue ASA  6. HSV-2 infection Continue Valtrex  Preterm labor symptoms and general obstetric precautions including but not limited to vaginal bleeding, contractions, leaking of fluid and fetal movement were reviewed in detail with the patient. Please refer to After Visit Summary for other counseling recommendations.   Return in about 4 weeks (around 01/30/2021) for postpartum.  Future Appointments  Date Time Provider [place]  01/05/2021  9:00 AM [facility] [place] None    Mora Bellman, MD

## 2021-01-03 ENCOUNTER — Other Ambulatory Visit: Payer: Self-pay | Admitting: Advanced Practice Midwife

## 2021-01-03 ENCOUNTER — Other Ambulatory Visit (HOSPITAL_COMMUNITY): Payer: Medicare Other

## 2021-01-03 LAB — CERVICOVAGINAL ANCILLARY ONLY
Chlamydia: NEGATIVE
Comment: NEGATIVE
Comment: NEGATIVE
Comment: NORMAL
Neisseria Gonorrhea: NEGATIVE
Trichomonas: NEGATIVE

## 2021-01-04 ENCOUNTER — Other Ambulatory Visit: Payer: Self-pay | Admitting: Advanced Practice Midwife

## 2021-01-05 ENCOUNTER — Encounter (HOSPITAL_COMMUNITY)
Admission: AD | Disposition: A | Discharge status: Home / Self Care | Payer: Self-pay | Source: Home / Self Care | Attending: Obstetrics and Gynecology

## 2021-01-05 ENCOUNTER — Encounter (HOSPITAL_COMMUNITY): Payer: Self-pay | Admitting: Obstetrics and Gynecology

## 2021-01-05 ENCOUNTER — Other Ambulatory Visit: Payer: Self-pay

## 2021-01-05 ENCOUNTER — Inpatient Hospital Stay (HOSPITAL_COMMUNITY): Payer: Medicare Other | Admitting: Anesthesiology

## 2021-01-05 ENCOUNTER — Inpatient Hospital Stay (HOSPITAL_COMMUNITY): Payer: Medicare Other

## 2021-01-05 ENCOUNTER — Inpatient Hospital Stay (HOSPITAL_COMMUNITY)
Admission: AD | Admit: 2021-01-05 | Discharge: 2021-01-07 | DRG: 783 | Disposition: A | Discharge status: Home / Self Care | Payer: Medicare Other | Attending: Obstetrics and Gynecology | Admitting: Obstetrics and Gynecology

## 2021-01-05 DIAGNOSIS — Z3490 Encounter for supervision of normal pregnancy, unspecified, unspecified trimester: Secondary | ICD-10-CM | POA: Diagnosis present

## 2021-01-05 DIAGNOSIS — D509 Iron deficiency anemia, unspecified: Secondary | ICD-10-CM | POA: Diagnosis present

## 2021-01-05 DIAGNOSIS — O2412 Pre-existing diabetes mellitus, type 2, in childbirth: Secondary | ICD-10-CM | POA: Diagnosis present

## 2021-01-05 DIAGNOSIS — B977 Papillomavirus as the cause of diseases classified elsewhere: Secondary | ICD-10-CM | POA: Diagnosis present

## 2021-01-05 DIAGNOSIS — Z7984 Long term (current) use of oral hypoglycemic drugs: Secondary | ICD-10-CM | POA: Diagnosis not present

## 2021-01-05 DIAGNOSIS — O30009 Twin pregnancy, unspecified number of placenta and unspecified number of amniotic sacs, unspecified trimester: Secondary | ICD-10-CM | POA: Diagnosis present

## 2021-01-05 DIAGNOSIS — O9902 Anemia complicating childbirth: Secondary | ICD-10-CM | POA: Diagnosis present

## 2021-01-05 DIAGNOSIS — O30043 Twin pregnancy, dichorionic/diamniotic, third trimester: Principal | ICD-10-CM | POA: Diagnosis present

## 2021-01-05 DIAGNOSIS — O98311 Other infections with a predominantly sexual mode of transmission complicating pregnancy, first trimester: Secondary | ICD-10-CM | POA: Diagnosis present

## 2021-01-05 DIAGNOSIS — O09299 Supervision of pregnancy with other poor reproductive or obstetric history, unspecified trimester: Secondary | ICD-10-CM

## 2021-01-05 DIAGNOSIS — Z8616 Personal history of COVID-19: Secondary | ICD-10-CM

## 2021-01-05 DIAGNOSIS — E1165 Type 2 diabetes mellitus with hyperglycemia: Secondary | ICD-10-CM | POA: Diagnosis present

## 2021-01-05 DIAGNOSIS — B009 Herpesviral infection, unspecified: Secondary | ICD-10-CM | POA: Diagnosis present

## 2021-01-05 DIAGNOSIS — O9832 Other infections with a predominantly sexual mode of transmission complicating childbirth: Secondary | ICD-10-CM | POA: Diagnosis present

## 2021-01-05 DIAGNOSIS — O99210 Obesity complicating pregnancy, unspecified trimester: Secondary | ICD-10-CM | POA: Diagnosis present

## 2021-01-05 DIAGNOSIS — A6000 Herpesviral infection of urogenital system, unspecified: Secondary | ICD-10-CM | POA: Diagnosis present

## 2021-01-05 DIAGNOSIS — Z794 Long term (current) use of insulin: Secondary | ICD-10-CM | POA: Diagnosis not present

## 2021-01-05 DIAGNOSIS — G43909 Migraine, unspecified, not intractable, without status migrainosus: Secondary | ICD-10-CM | POA: Diagnosis present

## 2021-01-05 DIAGNOSIS — Z3A37 37 weeks gestation of pregnancy: Secondary | ICD-10-CM

## 2021-01-05 DIAGNOSIS — Z302 Encounter for sterilization: Secondary | ICD-10-CM

## 2021-01-05 DIAGNOSIS — E119 Type 2 diabetes mellitus without complications: Secondary | ICD-10-CM | POA: Diagnosis not present

## 2021-01-05 DIAGNOSIS — Z3009 Encounter for other general counseling and advice on contraception: Secondary | ICD-10-CM | POA: Diagnosis present

## 2021-01-05 DIAGNOSIS — D573 Sickle-cell trait: Secondary | ICD-10-CM | POA: Diagnosis present

## 2021-01-05 DIAGNOSIS — O99214 Obesity complicating childbirth: Secondary | ICD-10-CM | POA: Diagnosis present

## 2021-01-05 DIAGNOSIS — O24319 Unspecified pre-existing diabetes mellitus in pregnancy, unspecified trimester: Secondary | ICD-10-CM | POA: Diagnosis present

## 2021-01-05 DIAGNOSIS — D582 Other hemoglobinopathies: Secondary | ICD-10-CM | POA: Diagnosis present

## 2021-01-05 DIAGNOSIS — Z87891 Personal history of nicotine dependence: Secondary | ICD-10-CM | POA: Diagnosis not present

## 2021-01-05 DIAGNOSIS — Z9851 Tubal ligation status: Secondary | ICD-10-CM

## 2021-01-05 LAB — CBC
HCT: 29.7 % — ABNORMAL LOW (ref 36.0–46.0)
Hemoglobin: 10.3 g/dL — ABNORMAL LOW (ref 12.0–15.0)
MCH: 27.2 pg (ref 26.0–34.0)
MCHC: 34.7 g/dL (ref 30.0–36.0)
MCV: 78.6 fL — ABNORMAL LOW (ref 80.0–100.0)
Platelets: 261 10*3/uL (ref 150–400)
RBC: 3.78 MIL/uL — ABNORMAL LOW (ref 3.87–5.11)
RDW: 16.2 % — ABNORMAL HIGH (ref 11.5–15.5)
WBC: 5.9 10*3/uL (ref 4.0–10.5)
nRBC: 0 % (ref 0.0–0.2)

## 2021-01-05 LAB — GLUCOSE, CAPILLARY
Glucose-Capillary: 76 mg/dL (ref 70–99)
Glucose-Capillary: 72 mg/dL (ref 70–99)
Glucose-Capillary: 75 mg/dL (ref 70–99)
Glucose-Capillary: 89 mg/dL (ref 70–99)

## 2021-01-05 LAB — TYPE AND SCREEN
ABO/RH(D): O POS
Antibody Screen: NEGATIVE

## 2021-01-05 LAB — RPR: RPR Ser Ql: NONREACTIVE

## 2021-01-05 SURGERY — Surgical Case
Anesthesia: Spinal | Wound class: Clean Contaminated

## 2021-01-05 MED ORDER — DIPHENHYDRAMINE HCL 25 MG PO CAPS: 25.0000 mg | ORAL_CAPSULE | Freq: Four times a day (QID) | ORAL | Status: DC | PRN

## 2021-01-05 MED ORDER — PHENYLEPHRINE HCL-NACL 20-0.9 MG/250ML-% IV SOLN
INTRAVENOUS | Status: AC
  Filled 2021-01-05: qty 250

## 2021-01-05 MED ORDER — ACETAMINOPHEN 325 MG PO TABS: 650.0000 mg | ORAL_TABLET | ORAL | Status: DC | PRN

## 2021-01-05 MED ORDER — LACTATED RINGERS IV SOLN: 500.0000 mL | INTRAVENOUS | Status: DC | PRN

## 2021-01-05 MED ORDER — SODIUM CHLORIDE 0.9 % IR SOLN
Status: DC | PRN
  Administered 2021-01-05: 1000 mL

## 2021-01-05 MED ORDER — ENOXAPARIN SODIUM 60 MG/0.6ML ~~LOC~~ SOLN
50.0000 mg | SUBCUTANEOUS | Status: DC
  Administered 2021-01-06 – 2021-01-07 (×2): 50 mg via SUBCUTANEOUS
  Filled 2021-01-05 (×2): qty 0.6

## 2021-01-05 MED ORDER — OXYTOCIN-SODIUM CHLORIDE 30-0.9 UT/500ML-% IV SOLN
2.5000 [IU]/h | INTRAVENOUS | Status: AC
  Administered 2021-01-05: 2.5 [IU]/h via INTRAVENOUS
  Filled 2021-01-05: qty 500

## 2021-01-05 MED ORDER — SOD CITRATE-CITRIC ACID 500-334 MG/5ML PO SOLN
30.0000 mL | ORAL | Status: AC
  Administered 2021-01-05: 30 mL via ORAL

## 2021-01-05 MED ORDER — KETOROLAC TROMETHAMINE 30 MG/ML IJ SOLN: 30.0000 mg | Freq: Once | INTRAMUSCULAR | Status: DC

## 2021-01-05 MED ORDER — IBUPROFEN 800 MG PO TABS
800.0000 mg | ORAL_TABLET | Freq: Four times a day (QID) | ORAL | Status: DC
  Administered 2021-01-06 – 2021-01-07 (×4): 800 mg via ORAL
  Filled 2021-01-05 (×4): qty 1

## 2021-01-05 MED ORDER — SODIUM CHLORIDE 0.9 % IV SOLN: INTRAVENOUS | Status: DC | PRN

## 2021-01-05 MED ORDER — ONDANSETRON HCL 4 MG/2ML IJ SOLN
4.0000 mg | Freq: Three times a day (TID) | INTRAMUSCULAR | Status: DC | PRN
  Administered 2021-01-05: 4 mg via INTRAVENOUS
  Filled 2021-01-05: qty 2

## 2021-01-05 MED ORDER — GENTAMICIN SULFATE 40 MG/ML IJ SOLN
5.0000 mg/kg | Freq: Once | INTRAVENOUS | Status: AC
  Administered 2021-01-05: 350 mg via INTRAVENOUS
  Filled 2021-01-05: qty 8.75

## 2021-01-05 MED ORDER — LACTATED RINGERS IV SOLN: INTRAVENOUS | Status: DC

## 2021-01-05 MED ORDER — FENTANYL CITRATE (PF) 100 MCG/2ML IJ SOLN
INTRAMUSCULAR | Status: AC
  Filled 2021-01-05: qty 2

## 2021-01-05 MED ORDER — DIPHENHYDRAMINE HCL 50 MG/ML IJ SOLN
12.5000 mg | Freq: Four times a day (QID) | INTRAMUSCULAR | Status: DC | PRN
  Administered 2021-01-05: 12.5 mg via INTRAVENOUS
  Filled 2021-01-05: qty 1

## 2021-01-05 MED ORDER — CLINDAMYCIN PHOSPHATE 900 MG/50ML IV SOLN
INTRAVENOUS | Status: AC
  Filled 2021-01-05: qty 50

## 2021-01-05 MED ORDER — SOD CITRATE-CITRIC ACID 500-334 MG/5ML PO SOLN
30.0000 mL | ORAL | Status: DC | PRN
  Filled 2021-01-05: qty 15

## 2021-01-05 MED ORDER — OXYTOCIN BOLUS FROM INFUSION: 333.0000 mL | Freq: Once | INTRAVENOUS | Status: DC

## 2021-01-05 MED ORDER — SODIUM CHLORIDE 0.9% FLUSH: 3.0000 mL | INTRAVENOUS | Status: DC | PRN

## 2021-01-05 MED ORDER — OXYTOCIN-SODIUM CHLORIDE 30-0.9 UT/500ML-% IV SOLN: 2.5000 [IU]/h | INTRAVENOUS | Status: DC

## 2021-01-05 MED ORDER — PHENYLEPHRINE HCL-NACL 20-0.9 MG/250ML-% IV SOLN
INTRAVENOUS | Status: DC | PRN
  Administered 2021-01-05: 60 ug/min via INTRAVENOUS

## 2021-01-05 MED ORDER — ACETAMINOPHEN 500 MG PO TABS: 1000.0000 mg | ORAL_TABLET | Freq: Four times a day (QID) | ORAL | Status: DC

## 2021-01-05 MED ORDER — CLINDAMYCIN PHOSPHATE 900 MG/50ML IV SOLN
900.0000 mg | Freq: Once | INTRAVENOUS | Status: AC
  Administered 2021-01-05: 900 mg via INTRAVENOUS

## 2021-01-05 MED ORDER — PROMETHAZINE HCL 25 MG/ML IJ SOLN: 6.2500 mg | INTRAMUSCULAR | Status: DC | PRN

## 2021-01-05 MED ORDER — BUPIVACAINE IN DEXTROSE 0.75-8.25 % IT SOLN
INTRATHECAL | Status: DC | PRN
  Administered 2021-01-05: 1.6 mL via INTRATHECAL

## 2021-01-05 MED ORDER — PRENATAL MULTIVITAMIN CH
1.0000 | ORAL_TABLET | Freq: Every day | ORAL | Status: DC
  Administered 2021-01-06 – 2021-01-07 (×2): 1 via ORAL
  Filled 2021-01-05 (×2): qty 1

## 2021-01-05 MED ORDER — METFORMIN HCL 500 MG PO TABS
500.0000 mg | ORAL_TABLET | Freq: Two times a day (BID) | ORAL | Status: DC
  Administered 2021-01-06: 500 mg via ORAL
  Filled 2021-01-05 (×2): qty 1

## 2021-01-05 MED ORDER — STERILE WATER FOR IRRIGATION IR SOLN
Status: DC | PRN
  Administered 2021-01-05: 1000 mL

## 2021-01-05 MED ORDER — OXYCODONE HCL 5 MG PO TABS
5.0000 mg | ORAL_TABLET | ORAL | Status: DC | PRN
  Administered 2021-01-06: 10 mg via ORAL
  Administered 2021-01-06: 5 mg via ORAL
  Administered 2021-01-07: 10 mg via ORAL
  Filled 2021-01-05: qty 1
  Filled 2021-01-05 (×2): qty 2

## 2021-01-05 MED ORDER — ONDANSETRON HCL 4 MG/2ML IJ SOLN
INTRAMUSCULAR | Status: DC | PRN
  Administered 2021-01-05: 4 mg via INTRAVENOUS

## 2021-01-05 MED ORDER — ONDANSETRON HCL 4 MG/2ML IJ SOLN
INTRAMUSCULAR | Status: AC
  Filled 2021-01-05: qty 2

## 2021-01-05 MED ORDER — KETOROLAC TROMETHAMINE 30 MG/ML IJ SOLN
30.0000 mg | Freq: Once | INTRAMUSCULAR | Status: AC
  Administered 2021-01-05: 30 mg via INTRAVENOUS

## 2021-01-05 MED ORDER — OXYTOCIN-SODIUM CHLORIDE 30-0.9 UT/500ML-% IV SOLN
INTRAVENOUS | Status: DC | PRN
  Administered 2021-01-05: 30 [IU] via INTRAVENOUS

## 2021-01-05 MED ORDER — TETANUS-DIPHTH-ACELL PERTUSSIS 5-2.5-18.5 LF-MCG/0.5 IM SUSY: 0.5000 mL | PREFILLED_SYRINGE | Freq: Once | INTRAMUSCULAR | Status: DC

## 2021-01-05 MED ORDER — FENTANYL CITRATE (PF) 100 MCG/2ML IJ SOLN: 25.0000 ug | INTRAMUSCULAR | Status: DC | PRN

## 2021-01-05 MED ORDER — LIDOCAINE HCL (PF) 1 % IJ SOLN: 30.0000 mL | INTRAMUSCULAR | Status: DC | PRN

## 2021-01-05 MED ORDER — SIMETHICONE 80 MG PO CHEW
80.0000 mg | CHEWABLE_TABLET | Freq: Three times a day (TID) | ORAL | Status: DC
  Administered 2021-01-06 – 2021-01-07 (×5): 80 mg via ORAL
  Filled 2021-01-05 (×6): qty 1

## 2021-01-05 MED ORDER — DIBUCAINE (PERIANAL) 1 % EX OINT
1.0000 | TOPICAL_OINTMENT | CUTANEOUS | Status: DC | PRN
Start: 2021-01-05 — End: 2021-01-07

## 2021-01-05 MED ORDER — ACETAMINOPHEN 500 MG PO TABS: 1000.0000 mg | ORAL_TABLET | Freq: Once | ORAL | Status: DC

## 2021-01-05 MED ORDER — OXYTOCIN-SODIUM CHLORIDE 30-0.9 UT/500ML-% IV SOLN
INTRAVENOUS | Status: AC
  Filled 2021-01-05: qty 500

## 2021-01-05 MED ORDER — MENTHOL 3 MG MT LOZG: 1.0000 | LOZENGE | OROMUCOSAL | Status: DC | PRN

## 2021-01-05 MED ORDER — KETOROLAC TROMETHAMINE 30 MG/ML IJ SOLN
30.0000 mg | Freq: Four times a day (QID) | INTRAMUSCULAR | Status: AC
  Administered 2021-01-05 – 2021-01-06 (×4): 30 mg via INTRAVENOUS
  Filled 2021-01-05 (×4): qty 1

## 2021-01-05 MED ORDER — NALOXONE HCL 4 MG/10ML IJ SOLN
1.0000 ug/kg/h | INTRAVENOUS | Status: DC | PRN
  Filled 2021-01-05: qty 5

## 2021-01-05 MED ORDER — FENTANYL CITRATE (PF) 100 MCG/2ML IJ SOLN
INTRAMUSCULAR | Status: DC | PRN
  Administered 2021-01-05: 15 ug via INTRATHECAL

## 2021-01-05 MED ORDER — KETOROLAC TROMETHAMINE 30 MG/ML IJ SOLN
INTRAMUSCULAR | Status: AC
  Filled 2021-01-05: qty 1

## 2021-01-05 MED ORDER — KETOROLAC TROMETHAMINE 30 MG/ML IJ SOLN: 30.0000 mg | Freq: Four times a day (QID) | INTRAMUSCULAR | Status: DC | PRN

## 2021-01-05 MED ORDER — ACETAMINOPHEN 500 MG PO TABS
1000.0000 mg | ORAL_TABLET | Freq: Four times a day (QID) | ORAL | Status: DC
  Administered 2021-01-06 – 2021-01-07 (×6): 1000 mg via ORAL
  Filled 2021-01-05 (×8): qty 2

## 2021-01-05 MED ORDER — MORPHINE SULFATE (PF) 0.5 MG/ML IJ SOLN
INTRAMUSCULAR | Status: AC
  Filled 2021-01-05: qty 10

## 2021-01-05 MED ORDER — SIMETHICONE 80 MG PO CHEW
80.0000 mg | CHEWABLE_TABLET | ORAL | Status: DC | PRN
  Administered 2021-01-06: 80 mg via ORAL
  Filled 2021-01-05: qty 1

## 2021-01-05 MED ORDER — ONDANSETRON HCL 4 MG/2ML IJ SOLN: 4.0000 mg | Freq: Four times a day (QID) | INTRAMUSCULAR | Status: DC | PRN

## 2021-01-05 MED ORDER — MORPHINE SULFATE (PF) 0.5 MG/ML IJ SOLN
INTRAMUSCULAR | Status: DC | PRN
  Administered 2021-01-05: .15 mg via INTRATHECAL

## 2021-01-05 MED ORDER — ACETAMINOPHEN 160 MG/5ML PO SOLN: 1000.0000 mg | Freq: Once | ORAL | Status: DC

## 2021-01-05 MED ORDER — WITCH HAZEL-GLYCERIN EX PADS: 1.0000 "application " | MEDICATED_PAD | CUTANEOUS | Status: DC | PRN

## 2021-01-05 MED ORDER — SENNOSIDES-DOCUSATE SODIUM 8.6-50 MG PO TABS
2.0000 | ORAL_TABLET | Freq: Every day | ORAL | Status: DC
  Administered 2021-01-06 – 2021-01-07 (×2): 2 via ORAL
  Filled 2021-01-05 (×2): qty 2

## 2021-01-05 MED ORDER — NALOXONE HCL 0.4 MG/ML IJ SOLN: 0.4000 mg | INTRAMUSCULAR | Status: DC | PRN

## 2021-01-05 MED ORDER — COCONUT OIL OIL: 1.0000 "application " | TOPICAL_OIL | Status: DC | PRN

## 2021-01-05 SURGICAL SUPPLY — 28 items
CANISTER PREVENA PLUS 150 (CANNISTER) ×2 IMPLANT
CHLORAPREP W/TINT 26ML (MISCELLANEOUS) ×2 IMPLANT
CLAMP CORD UMBIL (MISCELLANEOUS) IMPLANT
DRESSING PREVENA PLUS CUSTOM (GAUZE/BANDAGES/DRESSINGS) ×1 IMPLANT
DRSG OPSITE POSTOP 4X10 (GAUZE/BANDAGES/DRESSINGS) ×2 IMPLANT
DRSG PREVENA PLUS CUSTOM (GAUZE/BANDAGES/DRESSINGS) ×2
ELECT REM PT RETURN 9FT ADLT (ELECTROSURGICAL) ×2
ELECTRODE REM PT RTRN 9FT ADLT (ELECTROSURGICAL) ×1 IMPLANT
EXTRACTOR VACUUM M CUP 4 TUBE (SUCTIONS) IMPLANT
GLOVE BIOGEL PI IND STRL 6.5 (GLOVE) ×1 IMPLANT
GLOVE BIOGEL PI IND STRL 7.0 (GLOVE) ×1 IMPLANT
GLOVE BIOGEL PI INDICATOR 6.5 (GLOVE) ×1
GLOVE BIOGEL PI INDICATOR 7.0 (GLOVE) ×1
GLOVE SURG SS PI 6.5 STRL IVOR (GLOVE) ×2 IMPLANT
GOWN STRL REUS W/TWL LRG LVL3 (GOWN DISPOSABLE) ×4 IMPLANT
KIT ABG SYR 3ML LUER SLIP (SYRINGE) IMPLANT
NEEDLE HYPO 25X5/8 SAFETYGLIDE (NEEDLE) IMPLANT
NS IRRIG 1000ML POUR BTL (IV SOLUTION) ×2 IMPLANT
PACK C SECTION WH (CUSTOM PROCEDURE TRAY) ×2 IMPLANT
PAD OB MATERNITY 4.3X12.25 (PERSONAL CARE ITEMS) ×2 IMPLANT
PENCIL SMOKE EVAC W/HOLSTER (ELECTROSURGICAL) ×2 IMPLANT
RTRCTR C-SECT PINK 25CM LRG (MISCELLANEOUS) IMPLANT
SUT PLAIN 0 NONE (SUTURE) ×2 IMPLANT
SUT VIC AB 0 CT1 36 (SUTURE) ×10 IMPLANT
SUT VIC AB 4-0 KS 27 (SUTURE) ×2 IMPLANT
TOWEL OR 17X24 6PK STRL BLUE (TOWEL DISPOSABLE) ×2 IMPLANT
TRAY FOLEY W/BAG SLVR 14FR LF (SET/KITS/TRAYS/PACK) ×2 IMPLANT
WATER STERILE IRR 1000ML POUR (IV SOLUTION) ×2 IMPLANT

## 2021-01-05 NOTE — Transfer of Care (Signed)
Immediate Anesthesia Transfer of Care Note  Patient: Tiffany Hall  Procedure(s) Performed: CESAREAN SECTION (N/A )  Patient Location: PACU  Anesthesia Type:Spinal  Level of Consciousness: awake, alert  and oriented  Airway & Oxygen Therapy: Patient Spontanous Breathing  Post-op Assessment: Report given to RN and Post -op Vital signs reviewed and stable  Post vital signs: Reviewed and stable  Last Vitals:  Vitals Value Taken Time  BP 133/88   Temp    Pulse 83   Resp    SpO2 99     Last Pain:  Vitals:   01/05/21 0802  TempSrc: Oral  PainSc: 0-No pain         Complications: No complications documented.

## 2021-01-05 NOTE — H&P (Signed)
OBSTETRIC ADMISSION HISTORY AND PHYSICAL  BRITANY CALLICOTT is a 35 y.o. female [phone] with IUP at 36w0dby L/4 presenting for IOL of didi twins; however, given malpresentation of larger twin B, will proceed with Cesarean+bilateral tubal ligation s/p shared decision making with patient. She reports +FMs, No LOF, no VB, no blurry vision, headaches or peripheral edema, and RUQ pain.  She plans on breast feeding. She requests bilateral tubal ligation for birth control. [facility] --->  Estimated Date of Delivery: 01/26/21  Sono:  _0  Twin A: CWD, normal anatomy, cephalic presentation, maternal right side lie, 2435g, 20% EFW, 51% AC Twin B: CWD, normal anatomy, breech presentation, maternal left side lie, 2705g, 48% EFW, AC 82%  Prenatal History/Complications:  - TF6EP(novolog 10u TID with meals, MTF in pregnancy; no medications prior to pregnancy) - H/o HSV (last outbreak 2020) - H/o COVID (diagnosed 11/29/20) - DiDi Twin pregnancy - Obesity  Past Medical History: Past Medical History:  Diagnosis Date  . Anemia   . Anxiety   . Depression   . GERD (gastroesophageal reflux disease)    medication not needed  . Gestational diabetes    insulin  . Heart murmur   . History of abnormal cervical Pap smear    2009  . History of cardiac murmur as a child   . History of chlamydia    2008  . History of gallstones    2012  . History of gonorrhea    2008  . History of ovarian cyst   . History of trichomonal vaginitis    2008  . HSV (herpes simplex virus) anogenital infection   . Hypertension    PIH with pregnancy  . Migraine headache   . Preeclampsia in postpartum period 01/15/2015  . Pregnancy induced hypertension   . [place]   . Vaginal Pap smear, abnormal    HPV +    Past Surgical History: Past Surgical History:  Procedure Laterality Date  . COLPOSCOPY    . dilatation and currettage  N/A   . DILATION AND CURETTAGE  OF UTERUS  2009   W/  SUCTION --  required blood transfusion  . DILATION AND EVACUATION N/A 09/14/2015   Procedure: DILATATION AND EVACUATION;  Surgeon: MLinda Hedges DO;  [facility];  Service: Gynecology;  Laterality: N/A;    Obstetrical History: OB History    Gravida  8   Para  5   Term  5   Preterm  0   AB  2   Living  5     SAB  2   IAB  0   Ectopic  0   Multiple  0   Live Births  3           Social History Social History   Socioeconomic History  . Marital status: Divorced    Spouse name: Not on file  . Number of children: 5  . Years of education: Not on file  . Highest education level: Not on file  Occupational History  . Not on file  Tobacco Use  . Smoking status: Former Smoker    Packs/day: 0.00    Types: Cigarettes    Quit date: 11/05/2010    Years since quitting: 10.1  . Smokeless tobacco: Never Used  . Tobacco comment: quit with preg  Vaping Use  . Vaping Use: Never used  Substance and Sexual Activity  . Alcohol use: No  Comment: occ  . Drug use: No    Types: MDMA (Ecstacy)    Comment: prior to this pregnancy in 2009  . Sexual activity: Yes    Birth control/protection: None, Pill  Other Topics Concern  . Not on file  Social History Narrative  . Not on file   Social Determinants of Health   Financial Resource Strain: Not on file  Food Insecurity: No Food Insecurity  . Worried About Charity fundraiser in the Last Year: Never true  . Ran Out of Food in the Last Year: Never true  Transportation Needs: Not on file  Physical Activity: Not on file  Stress: Not on file  Social Connections: Not on file    Family History: Family History  Problem Relation Age of Onset  . Diabetes Mother   . Hypertension Mother   . Hepatitis Mother   . Cancer Mother 63       Breast, cervical  . Diabetes Maternal Grandmother   . Hypertension Maternal Grandmother   . Cancer Maternal Grandmother 40       Breast  . Anesthesia problems Neg  Hx   . Other Neg Hx     Allergies: Allergies  Allergen Reactions  . Amoxicillin Swelling    Did it involve swelling of the face/tongue/throat, SOB, or low BP? No Did it involve sudden or severe rash/hives, skin peeling, or any reaction on the inside of your mouth or nose? No Did you need to seek medical attention at a hospital or doctor's office? No When did it last happen?last year If all above answers are "NO", may proceed with cephalosporin use.   . Nubain [Nalbuphine Hcl] Swelling, Palpitations and Other (See Comments)    Pt states that she is able to take Percocet without any problems.       Medications Prior to Admission  Medication Sig Dispense Refill Last Dose  . acetaminophen (TYLENOL) 500 MG tablet Take 1,000 mg by mouth every 6 (six) hours as needed for mild pain or headache.   01/04/2021 at Unknown time  . aspirin EC 81 MG tablet Take 1 tablet (81 mg total) by mouth daily. Take after 12 weeks for prevention of preeclampsia later in pregnancy 300 tablet 2 Past Week at Unknown time  . docusate sodium (COLACE) 100 MG capsule Take 100 mg by mouth 2 (two) times daily.   Past Week at Unknown time  . ferrous sulfate 325 (65 FE) MG tablet Take 1 tablet (325 mg total) by mouth 2 (two) times daily with a meal. 60 tablet 5 01/04/2021 at Unknown time  . insulin aspart (NOVOLOG) 100 UNIT/ML FlexPen Inject 10 Units into the skin 3 (three) times daily with meals. 24 each 11 01/04/2021 at Unknown time  . Prenatal Vit-Fe Fumarate-FA (PRENATAL MULTIVITAMIN) TABS tablet Take 1 tablet by mouth daily at 12 noon.   01/04/2021 at Unknown time  . valACYclovir (VALTREX) 500 MG tablet Take 1 tablet (500 mg total) by mouth daily. 30 tablet 0 01/04/2021 at Unknown time  . Accu-Chek Softclix Lancets lancets Use as instructed 100 each 12   . Blood Glucose Monitoring Suppl (ACCU-CHEK GUIDE) w/Device KIT 1 Device by Does not apply route 4 (four) times daily. 1 kit 0   . Blood Pressure Monitoring (BLOOD  PRESSURE KIT) DEVI 1 kit by Does not apply route once a week. Check Blood Pressure regularly and record readings into the [employer] App.  Large Cuff.  DX O90.0 (Patient taking differently: 1 kit by Does  not apply route once a week. Check Blood Pressure regularly and record readings into the [employer] App.  Large Cuff.  DX O90.0) 1 each 0   . butalbital-acetaminophen-caffeine (FIORICET) 50-325-40 MG tablet Take 1-2 tablets by mouth every 6 (six) hours as needed for headache. 20 tablet 0   . glucose blood (ACCU-CHEK GUIDE) test strip Use to check blood sugars four times a day was instructed 50 each 12   . insulin glargine (LANTUS) 100 unit/mL SOPN Inject 40 Units into the skin daily. 18 each 12   . metFORMIN (GLUCOPHAGE) 500 MG tablet Take 500 mg by mouth 2 (two) times daily. (Patient not taking: No sig reported)     . terconazole (TERAZOL 7) 0.4 % vaginal cream Place 1 applicator vaginally at bedtime. (Patient not taking: No sig reported) 45 g 0      Review of Systems   All systems reviewed and negative except as stated in HPI  Blood pressure 117/76, pulse 77, temperature 98.7 F (37.1 C), resp. rate 16, height 5' 4" (1.626 m), weight 94.3 kg, last menstrual period 04/21/2020, SpO2 98 %. General appearance: alert, cooperative and appears stated age Lungs: normal WOB Heart: regular rate Abdomen: soft, non-tender Extremities: no sign of DVT Presentation: Twin A cephalic & Twin B breech on bedside ultrasound Fetal monitoring: Twin A: Baseline: 135 bpm, Variability: Good {> 6 bpm), Accelerations: Reactive and Decelerations: Absent Twin B: Baseline: 125 bpm, Variability: Good {> 6 bpm), Accelerations: Reactive and Decelerations: Absent Uterine activity: irregular    Prenatal labs: ABO, Rh: --/--/PENDING (03/31 0815) Antibody: PENDING (03/31 0815) Rubella: 2.65 (10/06 1425) RPR: Non Reactive (01/26 1345)  HBsAg: Negative (10/06 1425)  HIV: Non Reactive (01/26 1345)  GBS: NEGATIVE/--  (03/18 2148)  1 hr Glucola: not performed given early A1c 8.0% on 07/13/20 Genetic screening  wnl except for Carrier for Hemoglobin C Anatomy US wnl (didi twins)  Prenatal Transfer Tool  Maternal Diabetes: Yes:  Diabetes Type:  Insulin/Medication controlled Genetic Screening: Normal except for maternal carrier for Hemoglobin C  Maternal Ultrasounds/Referrals: Normal Fetal Ultrasounds or other Referrals:  Fetal echo, Referred to Materal Fetal Medicine  Maternal Substance Abuse:  No Significant Maternal Medications: insulin, metformin, ASA, valtrex Significant Maternal Lab Results: Group B Strep negative, h/o HSV (on valtrex)  Results for orders placed or performed during the hospital encounter of 01/05/21 (from the past 24 hour(s))  Type and screen   Collection Time: 01/05/21  8:15 AM  Result Value Ref Range   ABO/RH(D) PENDING    Antibody Screen PENDING    Sample Expiration      01/08/2021,2359 Performed at [facility], [address], [place], [place] [address]   Glucose, capillary   Collection Time: 01/05/21  8:42 AM  Result Value Ref Range   Glucose-Capillary 76 70 - 99 mg/dL    Patient Active Problem List   Diagnosis Date Noted  . Encounter for induction of labor 01/05/2021  . Unwanted fertility 11/02/2020  . 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 Hemoglobin C trait ([place]) 09/09/2020  . Pre-existing diabetes mellitus affecting pregnancy, antepartum 07/15/2020  . Twin pregnancy, antepartum condition or complication 09/81/1914  . Maternal obesity affecting pregnancy, antepartum 07/13/2020  . History of pre-eclampsia in prior pregnancy, currently pregnant 07/13/2020  . Supervision of high-risk pregnancy 07/05/2020  . 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  . Migraine 02/25/2018    Assessment/Plan:  AYJAH SHOW is a 35 y.o. [id number] at 41w0dhere for  IOL for Didi twins; however, given malpresentation of larger Twin B, will  proceed to primary Cesarean with bilateral tubal ligation s/p shared decision making with patient.  #Primary Cesarean +Bilateral Tubal Ligation: The risks of cesarean section were discussed with the patient including but were not limited to: bleeding which may require transfusion or reoperation; infection which may require antibiotics; injury to bowel, bladder, ureters or other surrounding organs; injury to the fetus; need for additional procedures including hysterectomy in the event of a life-threatening hemorrhage; placental abnormalities wth subsequent pregnancies, incisional problems, thromboembolic phenomenon and other postoperative/anesthesia complications.  Patient also desires permanent sterilization.  Other reversible forms of contraception were discussed with patient; she declines all other modalities. Risks of procedure discussed with patient including but not limited to: risk of regret, permanence of method, bleeding, infection, injury to surrounding organs and need for additional procedures.  Failure risk of about 1% with increased risk of ectopic gestation if pregnancy occurs was also discussed with patient.  Also discussed possibility of post-tubal pain syndrome. The patient concurred with the proposed plan, giving informed written consent for the procedures.  Patient has been NPO since 2330 on 01/04/21; she will remain NPO for procedure. Anesthesia and OR aware.  Preoperative prophylactic antibiotics (gentamycin, clindamycin) and SCDs ordered on call to the OR.  To OR when ready.  #Pain: Per anesthesia #FWB: Category 1 strip x2 #ID: GBS negative #MOF: breast #MOC: plan for postpartum BTL as noted above #Circ: desired #T2DM: A1c 8.0% early in pregnancy. No medications prior to pregnancy. Good adherence to novolog 10u TID with meals in addition to metformin. Will plan for BG check prior to OR, and then FBG check on PPD#1. #H/o HSV: good adherence to valtrex in pregnancy.  Randa Ngo,  MD OB Fellow, [facility] 01/05/2021 9:00 AM

## 2021-01-05 NOTE — Discharge Instructions (Signed)
Postpartum Care After Cesarean Delivery This sheet gives you information about how to care for yourself from the time you deliver your baby to up to 6-12 weeks after delivery (postpartum period). Your health care provider may also give you more specific instructions. If you have problems or questions, contact your health care provider. Follow these instructions at home: Medicines  Take over-the-counter and prescription medicines only as told by your health care provider.  If you were prescribed an antibiotic medicine, take it as told by your health care provider. Do not stop taking the antibiotic even if you start to feel better.  Ask your health care provider if the medicine prescribed to you: ? Requires you to avoid driving or using heavy machinery. ? Can cause constipation. You may need to take actions to prevent or treat constipation, such as:  Drink enough fluid to keep your urine pale yellow.  Take over-the-counter or prescription medicines.  Eat foods that are high in fiber, such as beans, whole grains, and fresh fruits and vegetables.  Limit foods that are high in fat and processed sugars, such as fried or sweet foods. Activity  Gradually return to your normal activities as told by your health care provider.  Avoid activities that take a lot of effort and energy (are strenuous) until approved by your health care provider. Walking at a slow to moderate pace is usually safe. Ask your health care provider what activities are safe for you. ? Do not lift anything that is heavier than your baby or 10 lb (4.5 kg) as told by your health care provider. ? Do not vacuum, climb stairs, or drive a car for as long as told by your health care provider.  If possible, have someone help you at home until you are able to do your usual activities yourself.  Rest as much as possible. Try to rest or take naps while your baby is sleeping. Vaginal bleeding  It is normal to have vaginal bleeding  (lochia) after delivery. Wear a sanitary pad to absorb vaginal bleeding and discharge. ? During the first week after delivery, the amount and appearance of lochia is often similar to a menstrual period. ? Over the next few weeks, it will gradually decrease to a dry, yellow-brown discharge. ? For most women, lochia stops completely by 4-6 weeks after delivery. Vaginal bleeding can vary from woman to woman.  Change your sanitary pads frequently. Watch for any changes in your flow, such as: ? A sudden increase in volume. ? A change in color. ? Large blood clots.  If you pass a blood clot, save it and call your health care provider to discuss. Do not flush blood clots down the toilet before you get instructions from your health care provider.  Do not use tampons or douches until your health care provider says this is safe.  If you are not breastfeeding, your period should return 6-8 weeks after delivery. If you are breastfeeding, your period may return anytime between 8 weeks after delivery and the time that you stop breastfeeding. Perineal care  If your C-section (Cesarean section) was unplanned, and you were allowed to labor and push before delivery, you may have pain, swelling, and discomfort of the tissue between your vaginal opening and your anus (perineum). You may also have an incision in the tissue (episiotomy) or the tissue may have torn during delivery. Follow these instructions as told by your health care provider: ? Keep your perineum clean and dry as told by your   health care provider. Use medicated pads and pain-relieving sprays and creams as directed. ? If you have an episiotomy or vaginal tear, check the area every day for signs of infection. Check for:  Redness, swelling, or pain.  Fluid or blood.  Warmth.  Pus or a bad smell. ? You may be given a squirt bottle to use instead of wiping to clean the perineum area after you go to the bathroom. As you start healing, you may use  the squirt bottle before wiping yourself. Make sure to wipe gently. ? To relieve pain caused by an episiotomy, vaginal tear, or hemorrhoids, try taking a warm sitz bath 2-3 times a day. A sitz bath is a warm water bath that is taken while you are sitting down. The water should only come up to your hips and should cover your buttocks.   Breast care  Within the first few days after delivery, your breasts may feel heavy, full, and uncomfortable (breast engorgement). You may also have milk leaking from your breasts. Your health care provider can suggest ways to help relieve breast discomfort. Breast engorgement should go away within a few days.  If you are breastfeeding: ? Wear a bra that supports your breasts and fits you well. ? Keep your nipples clean and dry. Apply creams and ointments as told by your health care provider. ? You may need to use breast pads to absorb milk leakage. ? You may have uterine contractions every time you breastfeed for several weeks after delivery. Uterine contractions help your uterus return to its normal size. ? If you have any problems with breastfeeding, work with your health care provider or a lactation consultant.  If you are not breastfeeding: ? Avoid touching your breasts as this can make your breasts produce more milk. ? Wear a well-fitting bra and use cold packs to help with swelling. ? Do not squeeze out (express) milk. This causes you to make more milk. Intimacy and sexuality  Ask your health care provider when you can engage in sexual activity. This may depend on your: ? Risk of infection. ? Healing rate. ? Comfort and desire to engage in sexual activity.  You are able to get pregnant after delivery, even if you have not had your period. If desired, talk with your health care provider about methods of family planning or birth control (contraception). Lifestyle  Do not use any products that contain nicotine or tobacco, such as cigarettes, e-cigarettes,  and chewing tobacco. If you need help quitting, ask your health care provider.  Do not drink alcohol, especially if you are breastfeeding. Eating and drinking  Drink enough fluid to keep your urine pale yellow.  Eat high-fiber foods every day. These may help prevent or relieve constipation. High-fiber foods include: ? Whole grain cereals and breads. ? Brown rice. ? Beans. ? Fresh fruits and vegetables.  Take your prenatal vitamins until your postpartum checkup or until your health care provider tells you it is okay to stop.   General instructions  Keep all follow-up visits for you and your baby as told by your health care provider. Most women visit their health care provider for a postpartum checkup within the first 3-6 weeks after delivery. Contact a health care provider if you:  Feel unable to cope with the changes that a new baby brings to your life, and these feelings do not go away.  Feel unusually sad or worried.  Have breasts that are painful, hard, or turn red.  Have a   fever.  Have trouble holding urine or keeping urine from leaking.  Have little or no interest in activities you used to enjoy.  Have not breastfed at all and you have not had a menstrual period for 12 weeks after delivery.  Have stopped breastfeeding and you have not had a menstrual period for 12 weeks after you stopped breastfeeding.  Have questions about caring for yourself or your baby.  Pass a blood clot from your vagina. Get help right away if you:  Have chest pain.  Have difficulty breathing.  Have sudden, severe leg pain.  Have severe pain or cramping in your abdomen.  Bleed from your vagina so much that you fill more than one sanitary pad in one hour. Bleeding should not be heavier than your heaviest period.  Develop a severe headache.  Faint.  Have blurred vision or spots in your vision.  Have a bad-smelling vaginal discharge.  Have thoughts about hurting yourself or your  baby. If you ever feel like you may hurt yourself or others, or have thoughts about taking your own life, get help right away. You can go to your nearest emergency department or call:  Your local emergency services (911 in the [place]  A suicide crisis helpline, such as the [facility] at (806)616-8991. This is open 24 hours a day. Summary  The period of time from when you deliver your baby to up to 6-12 weeks after delivery is called the postpartum period.  Gradually return to your normal activities as told by your health care provider.  Keep all follow-up visits for you and your baby as told by your health care provider. This information is not intended to replace advice given to you by your health care provider. Make sure you discuss any questions you have with your health care provider. Document Revised: 05/14/2018 Document Reviewed: 05/14/2018 Elsevier Patient Education  2021 [employer].   Postpartum Tubal Ligation, Care After The following information offers guidance on how to care for yourself after your procedure. Your health care provider may also give you more specific instructions. If you have problems or questions, contact your health care provider. What can I expect after the procedure? After the procedure, you may have:  A sore throat if general anesthesia was used.  Bruising or pain in your back.  Nausea or vomiting.  Dizziness.  Mild abdominal discomfort or pain, such as cramping, gas pain, or feeling bloated.  Soreness around the incision area.  Tiredness.  Pain in your shoulders. This is caused by the gas that was used during the procedure. Follow these instructions at home: Medicines  Ask your health care provider if the medicine prescribed to you: ? Requires you to avoid driving or using heavy machinery. ? Can cause constipation. You may need to take these actions to prevent or treat constipation:  Drink enough fluid to keep  your urine pale yellow.  Take over-the-counter or prescription medicines.  Eat foods that are high in fiber, such as beans, whole grains, and fresh fruits and vegetables.  Limit foods that are high in fat and processed sugars, such as fried or sweet foods.  Do not take aspirin because it can cause bleeding. Activity  Rest for the remainder of the day.  Avoid sitting for a long time without moving. Get up to take short walks every 1-2 hours. This is important to improve blood flow and breathing. Ask for help if you feel weak or unsteady.  Do not have sex,  douche, or put a tampon or anything else in your vagina for 6 weeks or as long as told by your health care provider.  Do not lift anything that is heavier than your baby for 2 weeks, or the limit that you are told, until your health care provider says that it is safe.  Return to your normal activities as told by your health care provider. Ask your health care provider what activities are safe for you. Incision care  Follow instructions from your health care provider about how to take care of your incision. Make sure you: ? Wash your hands with soap and water for at least 20 seconds before and after you change your bandage (dressing). If soap and water are not available, use hand sanitizer. ? Change your dressing as told by your health care provider. ? Leave stitches (sutures), skin glue, or adhesive strips in place. These skin closures may need to stay in place for 2 weeks or longer. If adhesive strip edges start to loosen and curl up, you may trim the loose edges. Do not remove adhesive strips completely unless your health care provider tells you to do that.  Check your incision area every day for signs of infection. Check for: ? Redness, swelling, or more pain. ? Fluid or blood. ? Warmth. ? Pus or a bad smell.      Other Instructions  Do not take baths, swim, or use a hot tub until your health care provider approves. Ask your  health care provider if you may take showers. You may only be allowed to take sponge baths.  Keep all follow-up visits. This is important. Contact a health care provider if:  You have signs of infection, such as: ? Redness, swelling, or more pain around your incision. ? Fluid or blood coming from your incision. ? Your incision feels warm to the touch. ? You have pus or a bad smell coming from your incision.  Your pain does not improve after 2-3 days.  The edges of your incision break open after the sutures have been removed.  You have a rash.  You repeatedly become dizzy or lightheaded.  You have pain and pain medicine is not helping.  You are constipated. Get help right away if:  You have a fever or chills.  You faint.  You have pain in your abdomen that gets worse, and the pain is not relieved by pain medicine.  You have shortness of breath or trouble breathing.  You have chest pain, leg pain, or leg swelling.  You have ongoing nausea or diarrhea. These symptoms may represent a serious problem that is an emergency. Do not wait to see if the symptoms will go away. Get medical help right away. Call your local emergency services (911 in the [place] Do not drive yourself to the hospital. Summary  Mild abdominal discomfort is common after this procedure.  Contact your health care provider if you experience problems or have concerns.  Do not lift anything that is heavier than your baby for 2 weeks, or the limit that you are told, until your health care provider says that it is safe.  Keep all follow-up visits. This is important. This information is not intended to replace advice given to you by your health care provider. Make sure you discuss any questions you have with your health care provider. Document Revised: 06/10/2020 Document Reviewed: 06/10/2020 Elsevier Patient Education  2021 [employer].

## 2021-01-05 NOTE — Discharge Summary (Addendum)
Postpartum Discharge Summary  Date of Service updated 01/07/21    Patient Name: Tiffany Hall DOB: 1985/12/18 MRN: [id number]  Date of admission: 01/05/2021 Delivery date:   Tiffany Hall [[id number]]  01/05/2021    Tiffany Hall [[id number]]  01/05/2021   Delivering provider:    Klute, Boy Hall [[id number]]  Tiffany Hall [[id number]]  Tiffany Hall   Date of discharge: 01/07/2021  Admitting diagnosis: Encounter for induction of labor [Z34.90] Intrauterine pregnancy: [redacted]w[redacted]d    Secondary diagnosis:  Principal Problem:   Cesarean delivery delivered Active Problems:   Twin pregnancy, antepartum condition or complication   Maternal obesity affecting pregnancy, antepartum   History of pre-eclampsia in prior pregnancy, currently pregnant   Pre-existing diabetes mellitus affecting pregnancy, antepartum   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 yo [id number] admitted to the hospital 01/05/2021 for IOL for DiPagehowever, on admission larger Twin B was found to be breech on bedside ultrasound. S/p shared decision making with pt, decision was made to proceed to cesarean section with the following indication:Malpresentation (Breech) of larger Twin B.Delivery details are as follows:  Membrane Rupture Time/Date:    Tiffany Hall[[id number]]10:15 AM    Tiffany Hall [0[[id number]]10:17 AM  ,   ThHarmonie, Verrastro0[[id number]]01/05/2021     ThLasean, Rahming0[[id number]]01/05/2021    Delivery Method:   Tiffany Hall [[id number]]C-Section, Low Transverse    ThDinara, Lupu0[[id number]]C-Section, Low Transverse   Details of operation can be found in separate operative note.  Patient had an uncomplicated postpartum course.  She is ambulating, tolerating a regular diet, passing flatus, and urinating well. Patient is discharged home in stable condition on  01/07/21        Newborn Data: Birth date:   Tiffany Hall[[id number]]01/05/2021    Tiffany Hall [[id number]]01/05/2021   Birth time:   Tiffany Hall [[id number]]10:16 AM    Tiffany Hall [[id number]]10:18 AM   Gender:   Tiffany Hall [[id number]]Female    Tiffany Hall [[id number]]Female   Living status:   Tiffany Hall [[id number]]Living    Tiffany Hall [[id number]]Living   Apgars:   Tiffany Hall [[id number]]9 [address] [0[[id number]]8 [place],   Tiffany Hall[[id number]][place]  Tiffany Hall[[id number]]8   Weight:   Tiffany Hall[[id number]]  8372    Tiffany Hall [0[[id number]]2965 g      Magnesium Sulfate received: No BMZ received: No Rhophylac:N/A MMR:N/A T-DaP:Given prenatally Flu: Yes Transfusion:No  Physical exam  Vitals:  01/06/21 1039 01/06/21 1459 01/06/21 2115 01/07/21 0506  BP: 112/74 120/73 127/73 130/80  Pulse: 77 85 78 71  Resp: 18 18 16 17   Temp: 98.2 F (36.8 C) 98.3 F (36.8 C) 98.6 F (37 C)   TempSrc: Oral Oral    SpO2: 100% 98% 100% 100%  Weight:      Height:       General: alert Lochia: appropriate Uterine Fundus: firm Incision: Healing well with no significant drainage DVT Evaluation: No evidence of DVT seen on physical exam. Labs: Lab Results  Component Value Date   WBC 9.1 01/06/2021   HGB 9.3 (L) 01/06/2021   HCT 27.2 (L) 01/06/2021   MCV 77.1 (L) 01/06/2021    PLT 216 01/06/2021   CMP Latest Ref Rng & Units 12/29/2020  Glucose 70 - 99 mg/dL 105(H)  BUN 6 - 20 mg/dL <5(L)  Creatinine 0.44 - 1.00 mg/dL 0.52  Sodium 135 - 145 mmol/L 133(L)  Potassium 3.5 - 5.1 mmol/L 3.8  Chloride 98 - 111 mmol/L 107  CO2 22 - 32 mmol/L 19(L)  Calcium 8.9 - 10.3 mg/dL 9.0  Total Protein 6.5 - 8.1 g/dL 5.9(L)  Total Bilirubin 0.3 - 1.2 mg/dL 0.3  Alkaline Phos 38 - 126 U/L 173(H)  AST 15 - 41 U/L 21  ALT 0 - 44 U/L 12   Edinburgh Score: No flowsheet data found.   After visit meds:  Allergies as of 01/07/2021      Reactions   Amoxicillin Swelling   Did it involve swelling of the face/tongue/throat, SOB, or low BP? No Did it involve sudden or severe rash/hives, skin peeling, or any reaction on the inside of your mouth or nose? No Did you need to seek medical attention at a hospital or doctor's office? No When did it last happen?last year If all above answers are "NO", may proceed with cephalosporin use.   Nubain [nalbuphine Hcl] Swelling, Palpitations, Other (See Comments)   Pt states that she is able to take Percocet without any problems.        Medication List    STOP taking these medications   acetaminophen 500 MG tablet Commonly known as: TYLENOL   aspirin EC 81 MG tablet   butalbital-acetaminophen-caffeine 50-325-40 MG tablet Commonly known as: FIORICET   docusate sodium 100 MG capsule Commonly known as: COLACE   insulin aspart 100 UNIT/ML FlexPen Commonly known as: NOVOLOG   insulin glargine 100 unit/mL Sopn Commonly known as: LANTUS   terconazole 0.4 % vaginal cream Commonly known as: Terazol 7     TAKE these medications   Accu-Chek Guide test strip Generic drug: glucose blood Use to check blood sugars four times a day was instructed   Accu-Chek Guide w/Device Kit 1 Device by Does not apply route 4 (four) times daily.   Accu-Chek Softclix Lancets lancets Use as instructed   Blood Pressure Kit Devi 1 kit by Does  not apply route once a week. Check Blood Pressure regularly and record readings into the [employer] App.  Large Cuff.  DX O90.0   ferrous sulfate 325 (65 FE) MG tablet Take 1 tablet (325 mg total) by mouth every other day. Start taking on: January 08, 2021 What changed: when to take this   ibuprofen 800 MG tablet Commonly known as: ADVIL Take 1 tablet (800 mg total) by mouth every 6 (six) hours.   prenatal multivitamin Tabs tablet Take 1 tablet by mouth daily at 12 noon.   valACYclovir 500 MG tablet Commonly  known as: Valtrex Take 1 tablet (500 mg total) by mouth daily.        Discharge home in stable condition Infant Feeding: No evidence of DVT seen on physical exam. Infant Disposition:home with mother Discharge instruction: per After Visit Summary and Postpartum booklet. Activity: Advance as tolerated. Pelvic rest for 6 weeks.  Diet: routine diet Future Appointments: Future Appointments  Date Time Provider [place]  01/12/2021 11:00 AM [place] None  02/02/2021  9:15 AM Constant, Vickii Chafe, MD [facility] None   Follow up Visit: Message sent to [facility] by Dr. Astrid Drafts.  Please schedule this patient for a In person postpartum visit in 6 weeks with the following provider: Any provider. Additional Postpartum F/U:Incision check 1 week and BP check 1 week  High risk pregnancy complicated by: H/o Preeclampsia, T2DM (discharged on metformin), Hemoglobin C trait Delivery mode:     Johnette, Teigen Belkis [[id number]]  C-Section, Low Transverse    Lashay, Osborne [[id number]]  C-Section, Low Transverse   Anticipated Birth Control:  BTL done [facility]   01/07/2021 Brooks Sailors, MD    Attestation of Supervision of Student:  I confirm that I have verified the information documented in the resident student's note and that I have also personally reperformed the history, physical exam and all medical decision making activities.  I have verified that all services and findings are  accurately documented in this student's note; and I agree with management and plan as outlined in the documentation. I have also made any necessary editorial changes.   Starr Lake, [place] for [employer], [employer] 01/07/2021 10:34 AM

## 2021-01-05 NOTE — Social Work (Signed)
MOB was referred for history of depression and anxiety.   * Referral screened out by Clinical Social Worker because none of the following criteria appear to apply:  ~ History of anxiety/depression during this pregnancy, or of post-partum depression following prior delivery. ~ Diagnosis of anxiety and/or depression within last 3 years. Per chart review, MOB diagnosed at or before 2013.  OR * MOB's symptoms currently being treated with medication and/or therapy.  Please contact the Clinical Social Worker if needs arise, by [facility] request, or if MOB scores greater than 9/yes to question 10 on Edinburgh Postpartum Depression Screen.  Darra Lis, [place] Work [employer]  [phone]

## 2021-01-05 NOTE — Anesthesia Postprocedure Evaluation (Signed)
Anesthesia Post Note  Patient: Tiffany Hall  Procedure(s) Performed: CESAREAN SECTION (N/A )     Patient location during evaluation: PACU Anesthesia Type: Spinal Level of consciousness: awake and alert and oriented Pain management: pain level controlled Vital Signs Assessment: post-procedure vital signs reviewed and stable Respiratory status: spontaneous breathing, nonlabored ventilation and respiratory function stable Cardiovascular status: blood pressure returned to baseline Postop Assessment: no apparent nausea or vomiting, spinal receding, no headache and no backache Anesthetic complications: no   No complications documented.  Last Vitals:  Vitals:   01/05/21 1212 01/05/21 1233  BP: 122/80 116/76  Pulse: 70 72  Resp: 13   Temp: 37 C 36.9 C  SpO2: 97% 99%    Last Pain:  Vitals:   01/05/21 1233  TempSrc:   PainSc: 0-No pain   Pain Goal:                   Brennan Bailey

## 2021-01-05 NOTE — Lactation Note (Signed)
This note was copied from a baby's chart. Lactation Consultation Note  Patient Name: Tiffany Hall ZLDJT'T Date: 01/05/2021 Reason for consult: Initial assessment;Mother's request;Early term 37-38.6wks;Multiple gestation;Infant < 6lbs;Maternal endocrine disorder (preclampsia) Age:36 hours   Mom dealing with dizziness and nausea most of the day. On arrival infants were both fed 12 ml of DBM using extra slow flow nipple. LC not able to see a latch. Mom did state infant A latching better than baby B. Mom to call for assistance with latching when infants cue again either RN or LC.   While in the room, Baby A two bouts of small clear emesis < 0.5 ml during the visit. LC suctioned with bulb syringe clear fluid.   Mom's nipples both erect and large with no signs of compression or trauma. LC set up DEBP and assessed flange size of 27 for both breasts. Mom pumping using DEBP at the end of the visit, as the Pediatrician arrived to due an assessment and talk with Mom.   LC reviewed LPTI guidelines to keep calorie loss low. Mom to offer breasts, followed by paced bottle feeding with extra slow flow nipple her EBM/ DBM based on LPTI guidelines. Mom to keep total feedings of breast and bottle under 30 minutes.   Plan 1. To feed based on cues 8-12x in 24 hr period no more than 3 hrs without an attempt. Mom to offer breasts first.            2. Paced bottle feed EBM /DBM based on LPTI guidelines             3. Mom to pump using DEBP q 3hrs for 15 minutes.              4. I and O sheet reviewed with RN              5. [place] brochure of inpatient and outpatient services provided.   All questions answered at the end of the visit.  Mom in communication with [facility] and will call in the morning about getting an electric pump for home.        Maternal Data Does the patient have breastfeeding experience prior to this delivery?: Yes How long did the patient breastfeed?: 6 years ago breastfed for 1  year  Feeding Mother's Current Feeding Choice: Breast Milk and Donor Milk  LATCH Score Latch: Repeated attempts needed to sustain latch, nipple held in mouth throughout feeding, stimulation needed to elicit sucking reflex.  Audible Swallowing: None  Type of Nipple: Everted at rest and after stimulation  Comfort (Breast/Nipple): Soft / non-tender  Hold (Positioning): Assistance needed to correctly position infant at breast and maintain latch.  LATCH Score: 6   Lactation Tools Discussed/Used Tools: Coconut oil;Pump;Flanges Flange Size: 27 Breast pump type: Double-Electric Breast Pump Pump Education: Setup, frequency, and cleaning;Milk Storage Reason for Pumping: increase stimulation Pumping frequency: every 3 hrs for 15 minutes  Interventions Interventions: Breast feeding basics reviewed;Breast compression;Skin to skin;Breast massage;Hand express;Expressed milk;Education;Coconut oil;DEBP;Position options  Discharge Pump: Manual WIC Program: Yes (Mom to call [place] in the morning to arrange getting a pump for home.)  Consult Status Consult Status: Follow-up Date: 01/06/21 Follow-up type: In-patient    Tiffany Heinrich  Hall 01/05/2021, 7:28 PM

## 2021-01-05 NOTE — Anesthesia Procedure Notes (Signed)
Spinal  Patient location during procedure: OR Start time: 01/05/2021 9:45 AM End time: 01/05/2021 9:48 AM Reason for block: surgical anesthesia Staffing Performed: anesthesiologist  Anesthesiologist: Brennan Bailey, MD Preanesthetic Checklist Completed: patient identified, IV checked, risks and benefits discussed, monitors and equipment checked, pre-op evaluation and timeout performed Spinal Block Patient position: sitting Prep: DuraPrep and site prepped and draped Patient monitoring: heart rate, continuous pulse ox and blood pressure Approach: midline Location: L3-4 Injection technique: single-shot Needle Needle type: Pencan  Needle gauge: 24 G Needle length: 10 cm Assessment Sensory level: T4 Events: CSF return Additional Notes Risks, benefits, and alternative discussed. Patient gave consent to procedure. Prepped and draped in sitting position. Clear CSF obtained after one needle pass. Positive terminal aspiration. No pain or paraesthesias with injection. Patient tolerated procedure well. Vital signs stable. Tawny Asal, MD

## 2021-01-05 NOTE — Op Note (Signed)
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 Mora Bellman, MD  ASSISTANT: Dr. Guillermina City, MD  ANESTHESIOLOGY TEAM: Anesthesiologist: Brennan Bailey, MD CRNA: Alain Marion, CRNA  INDICATIONS: Tiffany Hall is a 35 y.o. [id number] at [redacted]w[redacted]d here for cesarean section secondary to the indications listed under preoperative diagnoses; please see preoperative note for further details.  The risks of surgery were discussed with the patient including but were not limited to: bleeding which may require transfusion or reoperation; infection which may require antibiotics; injury to bowel, bladder, ureters or other surrounding organs; injury to the fetus; need for additional procedures including hysterectomy in the event of a life-threatening hemorrhage; formation of adhesions; placental abnormalities wth subsequent pregnancies; incisional problems; thromboembolic phenomenon and other postoperative/anesthesia complications.  The patient concurred with the proposed plan, giving informed written consent for the procedure.    FINDINGS:   Twin A: Viable female infant in cephalic presentation.  Apgars 9 and 10. Clear amniotic fluid. Twin B: Viable female infant in cephalic presentation (flipped from breech s/p delivery of Twin A).  Apgars 8 and 8. Clear amniotic fluid.  Intact placenta, three vessel cord. Normal uterus, fallopian tubes and ovaries bilaterally.  ANESTHESIA: Spinal INTRAVENOUS FLUIDS: 800 ml   ESTIMATED BLOOD LOSS: 248 ml URINE OUTPUT: 150 ml SPECIMENS: Placenta sent to pathology COMPLICATIONS: None immediate  PROCEDURE IN DETAIL:  The patient preoperatively received intravenous  antibiotics (ancef) and had sequential compression devices applied to her lower extremities. She was then taken to the operating room where spinal anesthesia was administered and was found to be adequate. She was then placed in a dorsal supine position with a leftward tilt, and prepped and draped in a sterile manner.  A foley catheter was placed into her bladder and attached to constant gravity.  After an adequate timeout was performed, a Pfannenstiel skin incision was made with scalpel and carried through to the underlying layer of fascia. The fascia was incised in the midline, and this incision was extended bilaterally using the Mayo scissors.  Kocher clamps were applied to the superior aspect of the fascial incision and the underlying rectus muscles were dissected off bluntly and sharply.  A similar process was carried out on the inferior aspect of the fascial incision. The rectus muscles were separated in the midline and the peritoneum was entered bluntly. The Alexis self-retaining retractor was introduced into the abdominal cavity.  Attention was turned to the lower uterine segment where a low transverse hysterotomy was made with a scalpel and extended bilaterally bluntly.  Twin A was successfully delivered, the cord was clamped and cut after one minute, and the infant was handed over to the awaiting neonatology team.  Twin B was then successfully delivered (flipped to cephalic from breech presentation s/p delivery of Twin A), the cord was clamped and cut after one minute, and the infant was handed over to the awaiting neonatology team. Uterine massage was then administered, and the placenta delivered intact with two three-vessel cords. The uterus was then cleared of clots and debris.  The hysterotomy was closed with 0 Vicryl in a running locked fashion, and an imbricating layer was also placed with 0 Vicryl. Several figure-of-eight 0 Vicryl serosal stitches were placed to help with hemostasis.  The left  Fallopian tube was identified by tracing out to the fimbraie, grasped with the Babcock clamps. Salpingectomy was performed using the clamp, cut, ligate method with use of 0 plain gut suture. Good hemostasis was noted after, using electrocautery for any small bleeding vessels. Attention was then turned to the right Fallopian tube, and a similar procedure was carried out after confirmation by tracing the tube out to the fimbriae. Hemostasis was confirmed.  The pelvis was cleared of all clot and debris. Hemostasis was confirmed on all surfaces. The retractor was removed.  The peritoneum was closed with a 0 Vicryl running stitch. The fascia was then closed using 0 Vicryl in a running fashion.  The subcutaneous layer was irrigated, and the skin was closed with a 4-0 Vicryl subcuticular stitch. A Provena wound vac was placed given pt's BMI and uncontrolled T2DM. The patient tolerated the procedure well. Sponge, instrument and needle counts were correct x 3.  She was taken to the recovery room in stable condition.   Randa Ngo, MD OB Fellow, Faculty Practice 01/05/2021 11:23 AM

## 2021-01-05 NOTE — Anesthesia Preprocedure Evaluation (Addendum)
Anesthesia Evaluation  Patient identified by MRN, date of birth, ID band Patient awake    Reviewed: Allergy & Precautions, NPO status , Patient's Chart, lab work & pertinent test results  History of Anesthesia Complications Negative for: history of anesthetic complications  Airway Mallampati: II  TM Distance: >3 FB Neck ROM: Full    Dental  (+) Missing,    Pulmonary former smoker,    Pulmonary exam normal        Cardiovascular hypertension (gestational), Normal cardiovascular exam     Neuro/Psych  Headaches, Anxiety Depression    GI/Hepatic Neg liver ROS, GERD  Controlled,  Endo/Other  diabetes, Gestational, Insulin Dependent  Renal/GU negative Renal ROS  negative genitourinary   Musculoskeletal negative musculoskeletal ROS (+)   Abdominal   Peds  Hematology  (+) Sickle cell trait and anemia , Hgb 10.3   Anesthesia Other Findings Day of surgery medications reviewed with patient.  Reproductive/Obstetrics (+) Pregnancy                            Anesthesia Physical Anesthesia Plan  ASA: III  Anesthesia Plan: Spinal   Post-op Pain Management:    Induction:   PONV Risk Score and Plan: 4 or greater and Treatment may vary due to age or medical condition, Ondansetron and Dexamethasone  Airway Management Planned: Natural Airway  Additional Equipment: None  Intra-op Plan:   Post-operative Plan:   Informed Consent: I have reviewed the patients History and Physical, chart, labs and discussed the procedure including the risks, benefits and alternatives for the proposed anesthesia with the patient or authorized representative who has indicated his/her understanding and acceptance.       Plan Discussed with: CRNA  Anesthesia Plan Comments:        Anesthesia Quick Evaluation

## 2021-01-05 NOTE — Lactation Note (Signed)
This note was copied from a baby's chart. Lactation Consultation Note  Patient Name: Tiffany Hall Date: 01/05/2021 Reason for consult: Initial assessment;Early term 37-38.6wks;Multiple gestation Age:36 hours   [facility] Student provided initial consult. FOBs present during consult. P6 Mother informed that Tiffany Hall was having more difficulty at breast and only feed for 10 minutes since birth. Whereas Tiffany Hall has feed for 30 minutes. She stated that latching infants felt fine, no breast pain or nipple discomfort while nursing. She is larger breasted. Colostrum noted with HE. Stated she tried breastfeeding her 20 year old daughter but d/c due to developing mastitis in first few days, did not try to feed her 58 year old. Mother informed that she was a bit dizzy and sleepy.   So [employer] student reviewed positioning to provide head and neck support to infant (also feeding multiples), infant behavior and feeding cues, I/Os, possible supplementation (EBM vs DBM and spoon/cup) and benefits of STS.   Mother informs that she is interested in supplementing but did not know she could provide her own EBM. She is interested in pumping. [facility] student was going to setup pump. RN advised to wait to set up pump as mothers BS was in 53s, in addition to reported dizziness. [place] [employer] student left pump equipment in room for lactation to return to do pump setup.   Plan: - Lactation will return for pump set up.   Maternal Data Has patient been taught Hand Expression?: Yes Does the patient have breastfeeding experience prior to this delivery?: Yes How long did the patient breastfeed?: few days  Feeding Mother's Current Feeding Choice: Breast Milk   Interventions Interventions: Breast feeding basics reviewed;Education  Discharge [place] Program: Yes  Consult Status Consult Status: Follow-up Date: 01/05/21 Follow-up type: In-patient    Tiffany Hall 01/05/2021, 1:51 PM

## 2021-01-06 ENCOUNTER — Encounter (HOSPITAL_COMMUNITY): Payer: Self-pay | Admitting: Obstetrics and Gynecology

## 2021-01-06 LAB — CBC
HCT: 27.2 % — ABNORMAL LOW (ref 36.0–46.0)
Hemoglobin: 9.3 g/dL — ABNORMAL LOW (ref 12.0–15.0)
MCH: 26.3 pg (ref 26.0–34.0)
MCHC: 34.2 g/dL (ref 30.0–36.0)
MCV: 77.1 fL — ABNORMAL LOW (ref 80.0–100.0)
Platelets: 216 10*3/uL (ref 150–400)
RBC: 3.53 MIL/uL — ABNORMAL LOW (ref 3.87–5.11)
RDW: 16.3 % — ABNORMAL HIGH (ref 11.5–15.5)
WBC: 9.1 10*3/uL (ref 4.0–10.5)
nRBC: 0 % (ref 0.0–0.2)

## 2021-01-06 LAB — GLUCOSE, CAPILLARY: Glucose-Capillary: 76 mg/dL (ref 70–99)

## 2021-01-06 MED ORDER — FERROUS SULFATE 325 (65 FE) MG PO TABS
325.0000 mg | ORAL_TABLET | ORAL | Status: DC
  Administered 2021-01-06: 325 mg via ORAL
  Filled 2021-01-06: qty 1

## 2021-01-06 NOTE — Lactation Note (Addendum)
This note was copied from a baby's chart. Lactation Consultation Note  Patient Name: Tiffany Hall WIOMB'T Date: 01/06/2021 Reason for consult: Follow-up assessment Age:36 hours  Follow-up visit to 30 hours twins.   Baby A: 2.52% weight loss at the time of consult. Mother reports infant took ~17mL of donor milk via bottle. Mother states he has some challenges to latch and does not seem eager to breastfeed. Infant is sleeping in basinet. LC assisted with suctioning twice since infant was gagging.   Baby B: 2.70% weight loss at the time of consult. Mother states she is ready to breastfeed infant. Infant is awake in basinet. LC unswaddled and undressed. [place] set up support pillows for football position to right breast. Colostrum is easily expressed. Latched infant after a few attempts. Noted suckling and swallowing for ~84minutes and then infant fell asleep. Encouraged to wake infant up and supplement with donor milk. Mother is bottlefeeding upon [place] leaving room.    Feeding plan:  1-Skin to skin 2-Aim for a deep, comfortable latch 3-Breastfeeding on demand or 8-12 times in 24h period. 4-Keep infant awake during breastfeeding session: massaging breast, infant's hand/shoulder/feet 5-Pump or hand-express and offer EBM prior to donor milk supplementation. 6-If needed, supplement following guidelines, paced bottle feeding and fullness cues.  7-Monitor voids and stools as signs good intake.  8-Encouraged maternal rest, hydration and food intake.  9-Contact LC as needed for feeds/support/concerns/questions  Maternal Data Has patient been taught Hand Expression?: Yes Does the patient have breastfeeding experience prior to this delivery?: Yes  Feeding Mother's Current Feeding Choice: Breast Milk and Donor Milk  LATCH Score Latch: Grasps breast easily, tongue down, lips flanged, rhythmical sucking.  Audible Swallowing: Spontaneous and intermittent  Type of Nipple: Everted at rest and after  stimulation (short shafted)  Comfort (Breast/Nipple): Soft / non-tender  Hold (Positioning): Assistance needed to correctly position infant at breast and maintain latch.  LATCH Score: 9  Interventions Interventions: Breast feeding basics reviewed;Assisted with latch;Skin to skin;Hand express;Breast massage;Adjust position;Breast compression;DEBP;Expressed milk;Support pillows;Education  Consult Status Consult Status: Follow-up Date: 01/07/21 Follow-up type: In-patient    Tiffany Hall A Tiffany Hall 01/06/2021, 4:52 PM

## 2021-01-06 NOTE — Progress Notes (Signed)
POSTPARTUM PROGRESS NOTE  Subjective: Tiffany Hall is a 35 y.o. [id number] s/p pLTCS at [redacted]w[redacted]d.  She reports she doing well. No acute events overnight. She denies any problems with ambulating, po intake. She has not voided yet - foley was just removed one hour ago. Denies nausea or vomiting. She has passed flatus. Pain is moderately controlled.  Lochia is mild.  Objective: Blood pressure 102/65, pulse 83, temperature 98.6 F (37 C), temperature source Oral, resp. rate 16, height 5\' 4"  (1.626 m), weight 94.3 kg, last menstrual period 04/21/2020, SpO2 99 %, unknown if currently breastfeeding.  Physical Exam:  General: alert, cooperative and no distress Chest: no respiratory distress Abdomen: soft, appropriately tender  Uterine Fundus: firm and at level of umbilicus. Wound vac in place over incision.  Extremities: No calf swelling or tenderness  no edema  Recent Labs    01/05/21 0813 01/06/21 0536  HGB 10.3* 9.3*  HCT 29.7* 27.2*    Assessment/Plan: Tiffany Hall is a 35 y.o. [id number] s/p pLTCS w BTL at [redacted]w[redacted]d. Tiffany Hall Twins. POD#0.  Routine Postpartum Care: Doing well, pain well-controlled.  -- Continue routine care, lactation support  -- Iron Deficiency Anemia: Hgb 10.3 --> 9.3. PO iron started.  -- Contraception: s/p BTL  -- Feeding: breast  --Type 2 Diabetes: restarted on PO metformin. Last A1C 8.0 in October 2021. Fasting CBG today 76. Will need outpatient f/u.  --Female infant x2: Consented for circumsion at bedside.   Dispo: Plan for discharge POD2 or POD3 .  Tiffany Berlin, MD OB Fellow, Faculty Practice 01/06/2021 7:01 AM

## 2021-01-06 NOTE — Plan of Care (Signed)
  Problem: Activity: Goal: Risk for activity intolerance will decrease Note: Discussed the importance of increasing activity throughout the day. Patient began ambulating in hallway and passing some gas as well. Tiffany Hall, Leretha Dykes [place]

## 2021-01-06 NOTE — Progress Notes (Addendum)
Inpatient Diabetes Program Recommendations  AACE/ADA: New Consensus Statement on Inpatient Glycemic Control (2015)  Target Ranges:  Prepandial:   less than 140 mg/dL      Peak postprandial:   less than 180 mg/dL (1-2 hours)      Critically ill patients:  140 - 180 mg/dL   Lab Results  Component Value Date   GLUCAP 76 01/06/2021   HGBA1C 8.0 (H) 07/13/2020    Review of Glycemic Control Results for DAYSY, SANTINI (MRN [id number]) as of 01/06/2021 09:25  Ref. Range 01/05/2021 08:42 01/05/2021 11:24 01/05/2021 13:42 01/05/2021 17:55 01/06/2021 05:57  Glucose-Capillary Latest Ref Range: 70 - 99 mg/dL 76 72 75 89 76   Diabetes history: DM 2 Current orders for Inpatient glycemic control: Metformin 500 mg bid  Inpatient Diabetes Program Recommendations:    Pt experiencing dizziness and nausea yesterday. Maybe due to glucose levels post partum.  -  Consider d/cing metformin for now and follow glucose trends.   Addendum 1016 am:  Spoke with pt over the phone. Pt reports being Pre diabetic just before pregnancy with a 6.3% A1c, however soon after was diagnosed with having DM type 2 with an 8% A1c early pregnancy. Discussed when to check glucose at home before the meals and to check 2-3 times a day until 6 week follow up. Discussed if glucose trends increase into the mid to upper 100 range to call before her appt to start metformin. Pt has a strong family history of DM type 2. Discussed her risk of developing type 2 in the future. Encouraged healthy lifestyle and diet to continue even though babies are delivered. Pt has plenty of glucose monitoring supplies at home.     Thanks,  Tama Headings RN, MSN, BC-ADM Inpatient Diabetes Coordinator Team Pager [phone] (8a-5p)

## 2021-01-07 MED ORDER — FERROUS SULFATE 325 (65 FE) MG PO TABS: 325.0000 mg | ORAL_TABLET | ORAL | 3 refills | Status: AC

## 2021-01-07 MED ORDER — IBUPROFEN 800 MG PO TABS: 800.0000 mg | ORAL_TABLET | Freq: Four times a day (QID) | ORAL | 0 refills | Status: DC

## 2021-01-07 NOTE — Lactation Note (Signed)
This note was copied from a baby's chart. Lactation Consultation Note  Patient Name: Tiffany Hall DJMEQ'A Date: 01/07/2021 Reason for consult: Follow-up assessment;Multiple gestation;Early term 37-38.6wks;Infant < 6lbs Age:36 hours s  Twins 37 weeks.  Mother states she will use her pump at home and not get a [facility] loaner at this time. Reviewed engorgement care and monitoring voids/stools.  Feeding Mother's Current Feeding Choice: Breast Milk and Donor Milk Nipple Type: Slow - flow   Lactation Tools Discussed/Used Tools: Pump Breast pump type: Double-Electric Breast Pump  Interventions Interventions: Education  Discharge Discharge Education: Engorgement and breast care;Warning signs for feeding baby Pump: DEBP;Personal  Consult Status Consult Status: Complete Date: 01/07/21    Vivianne Master [facility] 01/07/2021, 2:29 PM

## 2021-01-07 NOTE — Progress Notes (Signed)
Pt wanted nurse to check her blood pressure because she was feeling her heart skip a beat.  Pt's vital signs within normal limits: BP 115/71, 79, 18. Pt's apical pulse 80, no murmurs or skipped beats heard, pt is feeling anxious about going homr, pt reassured and discharge teaching and instructions given with after visit summary.

## 2021-01-07 NOTE — Plan of Care (Signed)
Discharge teaching given, pt receptive

## 2021-01-07 NOTE — Progress Notes (Signed)
OB Note Both babies examined and they look big enough for circs (Gomcos 1.1 x 2). R/b d/w mom and dad and they would like to do it  Aletha Halim, Brooke Bonito MD Attending Center for [employer] (Faculty Practice) 01/07/2021 Time: 2353

## 2021-01-07 NOTE — Lactation Note (Signed)
This note was copied from a baby's chart. Lactation Consultation Note  Patient Name: Boy Zarya Lasseigne EUMPN'T Date: 01/07/2021 Reason for consult: Follow-up assessment;Early term 37-38.6wks;Multiple gestation Age:36 hours  Twins 37 weeks.  Mother states she did not want to discuss breastfeeding.  Mother states she has been primarily giving babies donor milk. She states she at times has pumped with minimal volume. Encouraged her to pump with a goal of q 3 hours to help establish her milk supply. Provided mother with [facility] pump loaner paperwork. Suggest mother to call when ready for [facility] loaner.   Feeding Mother's Current Feeding Choice: Breast Milk and Donor Milk  Lactation Tools Discussed/Used Breast pump type: Double-Electric Breast Pump Pumping frequency:  (recommend q 3 hours)  Interventions Interventions: DEBP;Education  Discharge Discharge Education: Engorgement and breast care;Warning signs for feeding baby Pump: [facility] Program: Yes  Consult Status Consult Status: Complete Date: 01/07/21    Vivianne Master [facility] 01/07/2021, 9:25 AM

## 2021-01-09 LAB — SURGICAL PATHOLOGY

## 2021-01-12 ENCOUNTER — Ambulatory Visit: Payer: Medicare Other

## 2021-01-12 ENCOUNTER — Ambulatory Visit (INDEPENDENT_AMBULATORY_CARE_PROVIDER_SITE_OTHER): Payer: Medicare Other

## 2021-01-12 ENCOUNTER — Other Ambulatory Visit: Payer: Self-pay

## 2021-01-12 VITALS — BP 127/82 | HR 67

## 2021-01-12 DIAGNOSIS — Z5189 Encounter for other specified aftercare: Secondary | ICD-10-CM

## 2021-01-12 NOTE — Progress Notes (Addendum)
..  Subjective:  Tiffany Hall is a 35 y.o. female here for BP check.   Hypertension ROS: no TIA's, no chest pain on exertion, no dyspnea on exertion and no swelling of ankles.    Objective:  BP 127/82   Pulse 67   LMP 04/21/2020   Breastfeeding Yes   Appearance alert, well appearing, and in no distress. General exam BP noted to be well controlled today in office.   Pt had wound vac that was removed by Dr. Jodi Mourning today, incision appeared, clean, dry, intact, and with no signs of infection.   Assessment:   Blood Pressure well controlled.   Plan:  Current treatment plan is effective, no change in therapy.. PP visit scheduled for 02-02-21  Patient was assessed and managed by nursing staff during this encounter. I have reviewed the chart and agree with the documentation and plan. I have also made any necessary editorial changes.  Baltazar Najjar, MD 01/13/2021 8:16 AM

## 2021-01-14 ENCOUNTER — Other Ambulatory Visit: Payer: Self-pay

## 2021-01-14 ENCOUNTER — Inpatient Hospital Stay (HOSPITAL_COMMUNITY)
Admission: AD | Admit: 2021-01-14 | Discharge: 2021-01-14 | Disposition: A | Discharge status: Home / Self Care | Payer: Medicare Other | Attending: Obstetrics & Gynecology | Admitting: Obstetrics & Gynecology

## 2021-01-14 DIAGNOSIS — G8918 Other acute postprocedural pain: Secondary | ICD-10-CM

## 2021-01-14 DIAGNOSIS — O900 Disruption of cesarean delivery wound: Secondary | ICD-10-CM | POA: Diagnosis not present

## 2021-01-14 DIAGNOSIS — T8189XA Other complications of procedures, not elsewhere classified, initial encounter: Secondary | ICD-10-CM

## 2021-01-14 DIAGNOSIS — O99893 Other specified diseases and conditions complicating puerperium: Secondary | ICD-10-CM

## 2021-01-14 NOTE — Discharge Instructions (Signed)
Cesarean Delivery, Care After This sheet gives you information about how to care for yourself after your procedure. Your health care provider may also give you more specific instructions. If you have problems or questions, contact your health care provider. What can I expect after the procedure? After the procedure, it is common to have:  A small amount of blood or clear fluid coming from the incision.  Some redness, swelling, and pain in your incision area.  Some abdominal pain and soreness.  Vaginal bleeding (lochia). Even though you did not have a vaginal delivery, you will still have vaginal bleeding and discharge.  Pelvic cramps.  Fatigue. You may have pain, swelling, and discomfort in the tissue between your vagina and your anus (perineum) if:  Your C-section was unplanned, and you were allowed to labor and push.  An incision was made in the area (episiotomy) or the tissue tore during attempted vaginal delivery. Follow these instructions at home: Incision care  Follow instructions from your health care provider about how to take care of your incision. Make sure you: ? Wash your hands with soap and water before you change your bandage (dressing). If soap and water are not available, use hand sanitizer. ? If you have a dressing, change it or remove it as told by your health care provider. ? Leave stitches (sutures), skin staples, skin glue, or adhesive strips in place. These skin closures may need to stay in place for 2 weeks or longer. If adhesive strip edges start to loosen and curl up, you may trim the loose edges. Do not remove adhesive strips completely unless your health care provider tells you to do that.  Check your incision area every day for signs of infection. Check for: ? More redness, swelling, or pain. ? More fluid or blood. ? Warmth. ? Pus or a bad smell.  Do not take baths, swim, or use a hot tub until your health care provider says it's okay. Ask your health  care provider if you can take showers.  When you cough or sneeze, hug a pillow. This helps with pain and decreases the chance of your incision opening up (dehiscing). Do this until your incision heals.   Medicines  Take over-the-counter and prescription medicines only as told by your health care provider.  If you were prescribed an antibiotic medicine, take it as told by your health care provider. Do not stop taking the antibiotic even if you start to feel better.  Do not drive or use heavy machinery while taking prescription pain medicine. Lifestyle  Do not drink alcohol. This is especially important if you are breastfeeding or taking pain medicine.  Do not use any products that contain nicotine or tobacco, such as cigarettes, e-cigarettes, and chewing tobacco. If you need help quitting, ask your health care provider. Eating and drinking  Drink at least 8 eight-ounce glasses of water every day unless told not to by your health care provider. If you breastfeed, you may need to drink even more water.  Eat high-fiber foods every day. These foods may help prevent or relieve constipation. High-fiber foods include: ? Whole grain cereals and breads. ? Brown rice. ? Beans. ? Fresh fruits and vegetables. Activity  If possible, have someone help you care for your baby and help with household activities for at least a few days after you leave the hospital.  Return to your normal activities as told by your health care provider. Ask your health care provider what activities are safe for   you.  Rest as much as possible. Try to rest or take a nap while your baby is sleeping.  Do not lift anything that is heavier than 10 lbs (4.5 kg), or the limit that you were told, until your health care provider says that it is safe.  Talk with your health care provider about when you can engage in sexual activity. This may depend on your: ? Risk of infection. ? How fast you heal. ? Comfort and desire to  engage in sexual activity.   General instructions  Do not use tampons or douches until your health care provider approves.  Wear loose, comfortable clothing and a supportive and well-fitting bra.  Keep your perineum clean and dry. Wipe from front to back when you use the toilet.  If you pass a blood clot, save it and call your health care provider to discuss. Do not flush blood clots down the toilet before you get instructions from your health care provider.  Keep all follow-up visits for you and your baby as told by your health care provider. This is important. Contact a health care provider if:  You have: ? A fever. ? Bad-smelling vaginal discharge. ? Pus or a bad smell coming from your incision. ? Difficulty or pain when urinating. ? A sudden increase or decrease in the frequency of your bowel movements. ? More redness, swelling, or pain around your incision. ? More fluid or blood coming from your incision. ? A rash. ? Nausea. ? Little or no interest in activities you used to enjoy. ? Questions about caring for yourself or your baby.  Your incision feels warm to the touch.  Your breasts turn red or become painful or hard.  You feel unusually sad or worried.  You vomit.  You pass a blood clot from your vagina.  You urinate more than usual.  You are dizzy or light-headed. Get help right away if:  You have: ? Pain that does not go away or get better with medicine. ? Chest pain. ? Difficulty breathing. ? Blurred vision or spots in your vision. ? Thoughts about hurting yourself or your baby. ? New pain in your abdomen or in one of your legs. ? A severe headache.  You faint.  You bleed from your vagina so much that you fill more than one sanitary pad in one hour. Bleeding should not be heavier than your heaviest period. Summary  After the procedure, it is common to have pain at your incision site, abdominal cramping, and slight bleeding from your vagina.  Check  your incision area every day for signs of infection.  Tell your health care provider about any unusual symptoms.  Keep all follow-up visits for you and your baby as told by your health care provider. This information is not intended to replace advice given to you by your health care provider. Make sure you discuss any questions you have with your health care provider. Document Revised: 04/02/2018 Document Reviewed: 04/02/2018 Elsevier Patient Education  2021 [employer]vier Inc.  

## 2021-01-14 NOTE — MAU Provider Note (Signed)
Event Date/Time   First Provider Initiated Contact with Patient 01/14/21 2217      S [name] is a 35 y.o. [id number] patient who presents to MAU today with concerns regarding her cesarean incision. She is s/p cesarean for di/di twins with tubal ligation on 01/05/2021. She endorses new onset pain on the right ride of her incision as well as visualizing an area that is "slightly open". Patient states she had her wound vac removed 01/12/2021 and was advised by [facility] staff to "peel off the adhesive" after attempting to wash it off in the shower. She denies drainage, foul smell, bruising, streaking.   O BP 133/82 (BP Location: Right Arm)   Pulse 85   Temp 98.4 F (36.9 C) (Oral)   Resp 18   LMP 04/21/2020    Physical Exam Constitutional:      Appearance: Normal appearance.  Pulmonary:     Effort: Pulmonary effort is normal.  Abdominal:     General: Abdomen is flat.  Skin:    Comments: Lower abdominal incision from cesarean is clean, dry and intact. No bruising, streaking, drainage noted. 0.5 cm area of open skin along right margin. No tunneling. Pants and underwear noted to be directly abrading incision.   Neurological:     Mental Status: She is alert.    A Medical screening exam complete Incision healing very well Pt offered one small steri strip for small area of slower healing. Explained this is primarily for comfort and reassurance rather than any concern about dehiscence No evidence of infection Allow adhesive to come off at its own pace, avoid scrubbing in shower  P Discharge from MAU in stable condition Warning signs for worsening condition that would warrant emergency follow-up discussed Patient may return to MAU as needed   Darlina Rumpf, [place] 01/14/2021 11:36 PM

## 2021-01-14 NOTE — MAU Note (Addendum)
C/S 01/05/2021, Took wound vac off 01/12/2021, reports pain at incision, Right side tender and slightly open, no drainage, no odor. Reports 99 temp at home, swelling in feet, and no issues w/ VB.

## 2021-01-17 ENCOUNTER — Other Ambulatory Visit: Payer: Self-pay | Admitting: Obstetrics & Gynecology

## 2021-01-17 DIAGNOSIS — O30049 Twin pregnancy, dichorionic/diamniotic, unspecified trimester: Secondary | ICD-10-CM

## 2021-02-02 ENCOUNTER — Telehealth: Payer: Self-pay | Admitting: Licensed Clinical Social Worker

## 2021-02-02 ENCOUNTER — Other Ambulatory Visit: Payer: Self-pay

## 2021-02-02 ENCOUNTER — Encounter: Payer: Self-pay | Admitting: Obstetrics and Gynecology

## 2021-02-02 ENCOUNTER — Ambulatory Visit (INDEPENDENT_AMBULATORY_CARE_PROVIDER_SITE_OTHER): Payer: Medicare Other | Admitting: Obstetrics and Gynecology

## 2021-02-02 ENCOUNTER — Ambulatory Visit (INDEPENDENT_AMBULATORY_CARE_PROVIDER_SITE_OTHER): Payer: Medicare Other | Admitting: Licensed Clinical Social Worker

## 2021-02-02 DIAGNOSIS — F53 Postpartum depression: Secondary | ICD-10-CM

## 2021-02-02 DIAGNOSIS — O99345 Other mental disorders complicating the puerperium: Secondary | ICD-10-CM

## 2021-02-02 MED ORDER — SERTRALINE HCL 50 MG PO TABS: 50.0000 mg | ORAL_TABLET | Freq: Every day | ORAL | 5 refills | Status: DC

## 2021-02-02 NOTE — Progress Notes (Addendum)
[place] Partum Visit Note  Tiffany Hall is a 35 y.o. [id number] female who presents for a postpartum visit. She is 4 weeks postpartum following a primary cesarean section.  I have fully reviewed the prenatal and intrapartum course. The delivery was at 50 gestational weeks.  Anesthesia: spinal. Postpartum course has been unremarkable. Baby is doing well. Baby is feeding by bottle - Enfimil ensure . Bleeding staining only. Bowel function is abnormal: Constipation . Bladder function is normal. Patient is not sexually active. Contraception method is tubal ligation. Postpartum depression screening: positive. EPDS= 22. Patient admits to feeling overwhelmed and depressed at times. She states that she cries a lot and sometimes does not pick up her children when they are crying. She doesn't have any family in the area or community support. Her partner has returned to work. She plans to return to work in 4 weeks. She denies suicidal/homicidal ideations.     Edinburgh Postnatal Depression Scale - 02/02/21 0936      Edinburgh Postnatal Depression Scale:  In the Past 7 Days   I have been able to laugh and see the funny side of things. 1    I have looked forward with enjoyment to things. 1    I have blamed myself unnecessarily when things went wrong. 3    I have been anxious or worried for no good reason. 3    I have felt scared or panicky for no good reason. 3    Things have been getting on top of me. 2    I have been so unhappy that I have had difficulty sleeping. 3    I have felt sad or miserable. 3    I have been so unhappy that I have been crying. 2    The thought of harming myself has occurred to me. 1    Edinburgh Postnatal Depression Scale Total 22           Health Maintenance Due  Topic Date Due  . PNEUMOCOCCAL POLYSACCHARIDE VACCINE AGE 37-64 HIGH RISK  Never done  . FOOT EXAM  Never done  . OPHTHALMOLOGY EXAM  Never done  . URINE MICROALBUMIN  Never done  . HEMOGLOBIN A1C  01/11/2021        Review of Systems Pertinent items noted in HPI and remainder of comprehensive ROS otherwise negative.  Objective:  BP (!) 156/103   Pulse 67   Wt 183 lb (83 kg)   LMP 04/21/2020   Breastfeeding Yes Comment: BOTH   BMI 31.41 kg/m    General:  alert, cooperative and no distress   Breasts:  normal  Lungs: clear to auscultation bilaterally  Heart:  regular rate and rhythm  Abdomen: soft, non-tender; bowel sounds normal; no masses,  no organomegaly and incision healed without complication   Wound well approximated incision  GU exam:  not indicated       Assessment:    There are no diagnoses linked to this encounter.  Normal postpartum exam.   Plan:   Essential components of care per ACOG recommendations:  1.  Mood and well being: Patient with positive depression screening today. Reviewed local resources for support.  Patient scheduled to meet with behavioral health on 5/2 Rx Zoloft provided - Patient does not use tobacco. - hx of drug use? No    2. Infant care and feeding:  -Patient currently breastmilk feeding? Yes Discussed return to work and pumping. If needed,  Reviewed importance of draining breast regularly to support  lactation. -Social determinants of health (SDOH) reviewed in EPIC. No concerns  3. Sexuality, contraception and birth spacing - Patient does not want a pregnancy in the next year.   - Patient had a BTL at time of c-section   4. Sleep and fatigue -Encouraged family/partner/community support of 4 hrs of uninterrupted sleep to help with mood and fatigue  5. Physical Recovery  - Discussed patients delivery and complications - Patient is safe to resume physical and sexual activity  6. Chronic Disease Patient noted to have HTN today. Advised to monitor BP with home BP cuff Referral to family medicine placed - PCP follow up  Mora Bellman, [place] for [facility], [place]

## 2021-02-02 NOTE — BH Specialist Note (Signed)
Integrated Behavioral Health Initial In-Person Visit  MRN: [id number] Name: Tiffany Hall  Number of [facility] Clinician visits:: 1/6 Session Start time: 3:00pm  Session End time: 3:16pm Total time: 16 minutes via mychart   Types of Service: Individual psychotherapy/Video Visit   Interpretor:no  Interpretor Name and Language: None    Warm Hand Off Completed.       Subjective: Tiffany Hall is a 35 y.o. female accompanied by n/a Patient was referred by P.Constant MD for postpartum depression  Patient reports the following symptoms/concerns: post partum depression  Duration of problem: approx 2 weeks ; Severity of problem: moderate   Objective: Mood: good  and Affect: appropriate  Risk of harm to self or others: No risk of harm to self or others   Life Context: Family and Social: Limited support/FOB works second shift  School/Work: n/a Self-Care: none  Life Changes: Newborn twins   Patient and/or Family's Strengths/Protective Factors: Limited support   Goals Addressed: Patient will: 1. Reduce symptoms of: postpartum depression  2. Increase knowledge and/or ability of: implement copings and healthy habits to alleviate symptoms   3. Demonstrate ability to: self manage symptoms  Progress towards Goals: Ongoing   Interventions: Interventions utilized: supportive counseling   Standardized Assessments completed: edinburgh score 22    Assessment: Patient currently experiencing postpartum depression    Patient may benefit from integrated behavioral health   Plan: 1. Follow up with behavioral health clinician on : 5/2 and 2 weeks thereafter  2. Behavioral recommendations: Prioritize rest, take prescribed medication as directed, mindfulness and aromatherapy to reduce stress and help with sleep 3. Referral(s): Community support programs in [place]  4. "From scale of 1-10, how likely are you to follow plan?":   Lynnea Ferrier,  LCSW

## 2021-02-02 NOTE — Telephone Encounter (Signed)
Called pt regarding scheduled mychart visit. Left message for callback

## 2021-02-02 NOTE — Telephone Encounter (Signed)
Called pt regarding reported concerns of depression. Unable to reach pt left message with phone number for callback

## 2021-02-06 ENCOUNTER — Ambulatory Visit (INDEPENDENT_AMBULATORY_CARE_PROVIDER_SITE_OTHER): Payer: Medicare Other | Admitting: Licensed Clinical Social Worker

## 2021-02-06 DIAGNOSIS — F53 Postpartum depression: Secondary | ICD-10-CM

## 2021-02-06 DIAGNOSIS — O99345 Other mental disorders complicating the puerperium: Secondary | ICD-10-CM

## 2021-02-06 NOTE — BH Specialist Note (Signed)
Integrated Behavioral Health Follow Up Visit  MRN: [id number] Name: Tiffany Hall  Number of [facility] Clinician visits: 2/6 Session Start time: 1:00pm  Session End time: 1:09pm Total time: 9 minutes via mychart video   Types of Service: Mood Check   Interpretor:no  Interpretor Name and Language: none   Subjective: Tiffany Hall is a 35 y.o. female accompanied by n/a Patient was referred by Rosendo Gros MD for post partum depression . Patient reports the following symptoms/concerns: difficulty sleeping, depressed mood, difficulty bonding with newborns, limited support  Duration of problem: approx 3 weeks ; Severity of problem: moderate   Objective: Mood: good and Affect: appropriate  Risk of harm to self or others: Tiffany Hall reports no risk of harm to self or others   Life Context: Family and Social: Lives in [place] with children and boyfriend School/Work: n/a Self-Care: n/a Life Changes: newborn twins joined the household   Patient and/or Family's Strengths/Protective Factors: Tiffany Hall reports father of twins are supportive, stable housing and food secured   Goals Addressed: Patient will: 1.  Reduce symptoms of: postpartum depression   2.  Increase knowledge of diagnosis and implement coping strategies to alleviate symptoms   3.  Demonstrate ability to: self manage symptoms   Progress towards Goals: Ongoing   Interventions: Interventions utilized:  Supportive counseling and mood check  Standardized Assessments completed:  [place] from 02/06/2021 in [place]  PHQ-9 Total Score 13      Assessment: Patient currently experiencing postpartum depression   Patient may benefit from integrated behavioral health   Plan: 1. Follow up with behavioral health clinician on : 2 weeks via mychart  2. Behavioral recommendations: Pick up prescribed medication and use as directed, allow dad  to feed newborn at night to allow time for rest and bonding time, set smart goals and create schedule routine to prevent burnout and incorporate positive self affirmation to decrease negative self thoughts.  3. Referral(s): integrated behavioral health  4. "From scale of 1-10, how likely are you to follow plan?":   Lynnea Ferrier, LCSW

## 2021-02-09 ENCOUNTER — Encounter: Payer: Self-pay | Admitting: *Deleted

## 2021-02-16 ENCOUNTER — Ambulatory Visit: Payer: Medicare Other | Admitting: Obstetrics and Gynecology

## 2021-02-20 ENCOUNTER — Ambulatory Visit (INDEPENDENT_AMBULATORY_CARE_PROVIDER_SITE_OTHER): Payer: Medicare Other | Admitting: Licensed Clinical Social Worker

## 2021-02-20 DIAGNOSIS — O99345 Other mental disorders complicating the puerperium: Secondary | ICD-10-CM

## 2021-02-20 DIAGNOSIS — F53 Postpartum depression: Secondary | ICD-10-CM

## 2021-02-22 NOTE — BH Specialist Note (Signed)
Integrated Behavioral Health Follow Up In-Person Visit  MRN: [id number] Name: Tiffany Hall  Number of [facility] Clinician visits: 3/6 Session Start time: 1:00pm  Session End time: 1:40pm Total time: 40  minutes via mychart video   Types of Service: Individual psychotherapy  Interpretor:No. Interpretor Name and Language: none  Subjective: Tiffany Hall is a 35 y.o. female accompanied by n/a Patient was referred by Rosendo Gros MD for postpartum depression. Patient reports the following symptoms/concerns: depressed mood, social isolation,  Duration of problem: approx 2 months ; Severity of problem: mild  Objective: Mood: good and Affect: Appropriate Risk of harm to self or others: No plan to harm self or others  Life Context: Family and Social: Lives with children and partner in [place] School/Work: Looking for employment  Self-Care: Rest and decreasing social isolation with partner by planning lunch and date nights  Life Changes: Newborn twin recently delivered   Patient and/or Family's Strengths/Protective Factors: Parental Resilience  Goals Addressed: Patient will: 1.  Reduce symptoms of: depression  2.  Increase knowledge and/or ability of: coping skills and healthy habits  3.  Demonstrate ability to: Increase adequate support systems for patient/family  Progress towards Goals: Ongoing  Interventions: Interventions utilized:  Supportive Counseling Standardized Assessments completed: PHQ 9  Assessment: Patient currently experiencing postpartum depression   Patient may benefit from integrated behavioral health   Plan: 1. Follow up with behavioral health clinician on : 3 weeks mychart  2. Behavioral recommendations: Pt will continue decreasing social isolation and incorporate walking to boost mood, pt will also delegate family task to prevent burnout  3. Referral(s): n/a 4. "From scale of 1-10, how likely are you to follow plan?":    Lynnea Ferrier, LCSW

## 2021-02-27 ENCOUNTER — Other Ambulatory Visit: Payer: Self-pay | Admitting: Obstetrics and Gynecology

## 2021-02-27 MED ORDER — SLYND 4 MG PO TABS: 1.0000 | ORAL_TABLET | Freq: Every day | ORAL | 11 refills | Status: DC

## 2021-02-28 ENCOUNTER — Other Ambulatory Visit: Payer: Self-pay | Admitting: Obstetrics and Gynecology

## 2021-04-15 ENCOUNTER — Other Ambulatory Visit: Payer: Self-pay

## 2021-04-15 ENCOUNTER — Emergency Department (HOSPITAL_COMMUNITY)
Admission: EM | Admit: 2021-04-15 | Discharge: 2021-04-15 | Disposition: A | Discharge status: Home / Self Care | Payer: Medicare Other | Attending: Emergency Medicine | Admitting: Emergency Medicine

## 2021-04-15 ENCOUNTER — Encounter (HOSPITAL_COMMUNITY): Payer: Self-pay | Admitting: Emergency Medicine

## 2021-04-15 DIAGNOSIS — I10 Essential (primary) hypertension: Secondary | ICD-10-CM | POA: Diagnosis not present

## 2021-04-15 DIAGNOSIS — Z87891 Personal history of nicotine dependence: Secondary | ICD-10-CM | POA: Diagnosis not present

## 2021-04-15 DIAGNOSIS — R42 Dizziness and giddiness: Secondary | ICD-10-CM | POA: Diagnosis not present

## 2021-04-15 DIAGNOSIS — N898 Other specified noninflammatory disorders of vagina: Secondary | ICD-10-CM | POA: Diagnosis not present

## 2021-04-15 DIAGNOSIS — N939 Abnormal uterine and vaginal bleeding, unspecified: Secondary | ICD-10-CM

## 2021-04-15 DIAGNOSIS — R519 Headache, unspecified: Secondary | ICD-10-CM

## 2021-04-15 LAB — URINALYSIS, ROUTINE W REFLEX MICROSCOPIC
Bacteria, UA: NONE SEEN
Bilirubin Urine: NEGATIVE
Glucose, UA: NEGATIVE mg/dL
Ketones, ur: NEGATIVE mg/dL
Leukocytes,Ua: NEGATIVE
Nitrite: NEGATIVE
Protein, ur: NEGATIVE mg/dL
RBC / HPF: >50 RBC/hpf — ABNORMAL HIGH (ref 0–5)
Specific Gravity, Urine: 1.013 (ref 1.005–1.030)
pH: 6 (ref 5.0–8.0)

## 2021-04-15 LAB — BASIC METABOLIC PANEL
Anion gap: 6 (ref 5–15)
BUN: 9 mg/dL (ref 6–20)
CO2: 27 mmol/L (ref 22–32)
Calcium: 9.3 mg/dL (ref 8.9–10.3)
Chloride: 104 mmol/L (ref 98–111)
Creatinine, Ser: 0.49 mg/dL (ref 0.44–1.00)
GFR, Estimated: >60 mL/min (ref >60)
Glucose, Bld: 119 mg/dL — ABNORMAL HIGH (ref 70–99)
Potassium: 3.5 mmol/L (ref 3.5–5.1)
Sodium: 137 mmol/L (ref 135–145)

## 2021-04-15 LAB — I-STAT BETA HCG BLOOD, ED (MC, WL, AP ONLY): I-stat hCG, quantitative: <5 m[IU]/mL (ref <5)

## 2021-04-15 LAB — CBC
HCT: 31.5 % — ABNORMAL LOW (ref 36.0–46.0)
Hemoglobin: 10.5 g/dL — ABNORMAL LOW (ref 12.0–15.0)
MCH: 25.5 pg — ABNORMAL LOW (ref 26.0–34.0)
MCHC: 33.3 g/dL (ref 30.0–36.0)
MCV: 76.5 fL — ABNORMAL LOW (ref 80.0–100.0)
Platelets: 366 10*3/uL (ref 150–400)
RBC: 4.12 MIL/uL (ref 3.87–5.11)
RDW: 15.6 % — ABNORMAL HIGH (ref 11.5–15.5)
WBC: 5.5 10*3/uL (ref 4.0–10.5)
nRBC: 0 % (ref 0.0–0.2)

## 2021-04-15 MED ORDER — KETOROLAC TROMETHAMINE 15 MG/ML IJ SOLN
15.0000 mg | Freq: Once | INTRAMUSCULAR | Status: AC
  Administered 2021-04-15: 15 mg via INTRAMUSCULAR
  Filled 2021-04-15: qty 1

## 2021-04-15 MED ORDER — SODIUM CHLORIDE 0.9% FLUSH: 3.0000 mL | Freq: Once | INTRAVENOUS | Status: DC

## 2021-04-15 MED ORDER — ACETAMINOPHEN 500 MG PO TABS
1000.0000 mg | ORAL_TABLET | Freq: Once | ORAL | Status: AC
  Administered 2021-04-15: 1000 mg via ORAL
  Filled 2021-04-15: qty 2

## 2021-04-15 NOTE — ED Triage Notes (Signed)
Pt to triage via [facility] from home.  Reports dizziness, headache, and neck pain since this morning at 6am.  LMP started yesterday and reports heavy vaginal bleeding since this morning.  Using 1 tampon and pad/hour. No arm drift.  Denies numbness/weakness.

## 2021-04-15 NOTE — Discharge Instructions (Signed)
Return for any problem.  ?

## 2021-04-15 NOTE — ED Provider Notes (Signed)
[facility] EMERGENCY DEPARTMENT Provider Note   CSN: [id number] Arrival date & time: 04/15/21  1318     History Chief Complaint  Patient presents with   Dizziness    Tiffany Hall is a 35 y.o. female.  35 year old female with prior medical history as detailed below presents for evaluation.  Patient complains of onset of menses yesterday.  Patient reports that her flow has been heavier over the last 24 hours than her normal period or less prior.  Was 1 month previously.  She denies abdominal pain or cramping.  She complains of mild headache and feeling weak.  She reports prior history of significant anemia requiring blood transfusion.  She denies fever.  She denies nausea or vomiting.  She has not taken anything for her symptoms today.  The history is provided by the patient and medical records.  Dizziness Quality:  Lightheadedness Severity:  Mild Onset quality:  Gradual Duration:  1 day Timing:  Rare Progression:  Unchanged Chronicity:  New Relieved by:  Nothing Worsened by:  Nothing Associated symptoms: headaches       Past Medical History:  Diagnosis Date   Anemia    Anxiety    Depression    GERD (gastroesophageal reflux disease)    medication not needed   Gestational diabetes    insulin   Heart murmur    History of abnormal cervical Pap smear    2009   History of cardiac murmur as a child    History of chlamydia    2008   History of gallstones    2012   History of gonorrhea    2008   History of ovarian cyst    History of trichomonal vaginitis    2008   HSV (herpes simplex virus) anogenital infection    Hypertension    PIH with pregnancy   Migraine headache    Preeclampsia in postpartum period 01/15/2015   Pregnancy induced hypertension    Sickle cell trait ([place])    Vaginal Pap smear, abnormal    HPV +    Patient Active Problem List   Diagnosis Date Noted   Encounter for induction of labor 01/05/2021   Cesarean delivery  delivered 01/05/2021   History of bilateral tubal ligation 01/05/2021   Unwanted fertility 11/02/2020   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, antepartum, first trimester 09/09/2020   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   Pre-existing diabetes mellitus affecting pregnancy, antepartum 07/15/2020   Twin pregnancy, antepartum condition or complication 20/07/711   Maternal obesity affecting pregnancy, antepartum 07/13/2020   History of pre-eclampsia in prior pregnancy, currently pregnant 07/13/2020   Supervision of high-risk pregnancy 07/05/2020   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   Migraine 02/25/2018    Past Surgical History:  Procedure Laterality Date   CESAREAN SECTION MULTI-GESTATIONAL N/A 01/05/2021   Procedure: CESAREAN SECTION MULTI-GESTATIONAL;  Surgeon: Mora Bellman, MD;  Location: [facility];  Service: Obstetrics;  Laterality: N/A;   COLPOSCOPY     dilatation and currettage  N/A    DILATION AND CURETTAGE OF UTERUS  2009   W/  SUCTION --  required blood transfusion   DILATION AND EVACUATION N/A 09/14/2015   Procedure: DILATATION AND EVACUATION;  Surgeon: Linda Hedges, DO;  Location: [facility];  Service: Gynecology;  Laterality: N/A;     OB History     Gravida  8   Para  6   Term  6   Preterm  0   AB  2  Living  7      SAB  2   IAB  0   Ectopic  0   Multiple  1   Live Births  5           Family History  Problem Relation Age of Onset   Diabetes Mother    Hypertension Mother    Hepatitis Mother    Cancer Mother 102       Breast, cervical   Diabetes Maternal Grandmother    Hypertension Maternal Grandmother    Cancer Maternal Grandmother 29       Breast   Anesthesia problems Neg Hx    Other Neg Hx     Social History   Tobacco Use   Smoking status: Former    Packs/day: 0.00    Pack years: 0.00    Types: Cigarettes    Quit date: 11/05/2010    Years since quitting: 10.4   Smokeless tobacco: Never   Tobacco comments:    quit with preg   Vaping Use   Vaping Use: Never used  Substance Use Topics   Alcohol use: No    Comment: occ   Drug use: No    Types: MDMA (Ecstacy)    Comment: prior to this pregnancy in 2009    Home Medications Prior to Admission medications   Medication Sig Start Date End Date Taking? Authorizing Provider  Accu-Chek Softclix Lancets lancets Use as instructed Patient not taking: No sig reported 08/04/20   Shelly Bombard, MD  Blood Glucose Monitoring Suppl (ACCU-CHEK GUIDE) w/Device KIT 1 Device by Does not apply route 4 (four) times daily. Patient not taking: No sig reported 08/04/20   Shelly Bombard, MD  Blood Pressure Monitoring (BLOOD PRESSURE KIT) DEVI 1 kit by Does not apply route once a week. Check Blood Pressure regularly and record readings into the Babys[employer] App.  Large Cuff.  DX O90.0 Patient not taking: No sig reported 07/06/20   Constant, Peggy, MD  docusate sodium (COLACE) 100 MG capsule Take 100 mg by mouth 2 (two) times daily. Patient not taking: Reported on 02/02/2021    [provider]  Drospirenone (SLYND) 4 MG TABS Take 1 tablet by mouth daily. 02/27/21   Constant, Peggy, MD  FENUGREEK PO Take by mouth.    [provider]  ferrous sulfate 325 (65 FE) MG tablet Take 1 tablet (325 mg total) by mouth every other day. Patient not taking: No sig reported 01/08/21   Muus, Gwynn Burly, MD  glucose blood (ACCU-CHEK GUIDE) test strip Use to check blood sugars four times a day was instructed Patient not taking: No sig reported 08/04/20   Shelly Bombard, MD  ibuprofen (ADVIL) 800 MG tablet Take 1 tablet (800 mg total) by mouth every 6 (six) hours. Patient not taking: Reported on 02/02/2021 01/07/21   Muus, Gwynn Burly, MD  Prenatal Vit-Fe Fumarate-FA (PRENATAL MULTIVITAMIN) TABS tablet Take 1 tablet by mouth daily at 12 noon.    [provider]  sertraline (ZOLOFT) 50 MG tablet TAKE 1 TABLET BY MOUTH EVERY DAY 03/02/21   Constant, Peggy, MD    Allergies    Amoxicillin  and Nubain [nalbuphine hcl]  Review of Systems   Review of Systems  Neurological:  Positive for light-headedness and headaches.  All other systems reviewed and are negative.  Physical Exam Updated Vital Signs BP (!) 143/96   Pulse 64   Temp 98.3 F (36.8 C) (Oral)   Resp 17   LMP  04/14/2021 (Exact Date)   SpO2 100%   Physical Exam Vitals and nursing note reviewed.  Constitutional:      General: She is not in acute distress.    Appearance: Normal appearance. She is well-developed.  HENT:     Head: Normocephalic and atraumatic.  Eyes:     Conjunctiva/sclera: Conjunctivae normal.     Pupils: Pupils are equal, round, and reactive to light.  Cardiovascular:     Rate and Rhythm: Normal rate and regular rhythm.     Heart sounds: Normal heart sounds.  Pulmonary:     Effort: Pulmonary effort is normal. No respiratory distress.     Breath sounds: Normal breath sounds.  Abdominal:     General: There is no distension.     Palpations: Abdomen is soft.     Tenderness: There is no abdominal tenderness.  Musculoskeletal:        General: No deformity. Normal range of motion.     Cervical back: Normal range of motion and neck supple.  Skin:    General: Skin is warm and dry.  Neurological:     General: No focal deficit present.     Mental Status: She is alert and oriented to person, place, and time. Mental status is at baseline.     Cranial Nerves: No cranial nerve deficit.     Sensory: No sensory deficit.     Motor: No weakness.     Coordination: Coordination normal.    ED Results / Procedures / Treatments   Labs (all labs ordered are listed, but only abnormal results are displayed) Labs Reviewed  BASIC METABOLIC PANEL - Abnormal; Notable for the following components:      Result Value   Glucose, Bld 119 (*)    All other components within normal limits  CBC - Abnormal; Notable for the following components:   Hemoglobin 10.5 (*)    HCT 31.5 (*)    MCV 76.5 (*)    MCH 25.5  (*)    RDW 15.6 (*)    All other components within normal limits  URINALYSIS, ROUTINE W REFLEX MICROSCOPIC  I-STAT BETA HCG BLOOD, ED ([facility], [facility], [facility] ONLY)  CBG MONITORING, ED    EKG EKG Interpretation  Date/Time:  Saturday April 15 2021 13:25:19 EDT Ventricular Rate:  65 PR Interval:  222 QRS Duration: 88 QT Interval:  370 QTC Calculation: 384 R Axis:   40 Text Interpretation: Sinus rhythm with 1st degree A-V block Otherwise normal ECG Confirmed by Dene Gentry [phone]) on 04/15/2021 2:57:05 PM  Radiology No results found.  Procedures Procedures   Medications Ordered in ED Medications  sodium chloride flush (NS) 0.9 % injection 3 mL (has no administration in time range)  acetaminophen (TYLENOL) tablet 1,000 mg (has no administration in time range)  ketorolac (TORADOL) 15 MG/ML injection 15 mg (has no administration in time range)    ED Course  I have reviewed the triage vital signs and the nursing notes.  Pertinent labs & imaging results that were available during my care of the patient were reviewed by me and considered in my medical decision making (see chart for details).    MDM Rules/Calculators/A&P                          MDM  MSE complete  Tiffany Hall was evaluated in Emergency Department on 04/15/2021 for the symptoms described in the history of present illness. She was evaluated in the context  of the global COVID-19 pandemic, which necessitated consideration that the patient might be at risk for infection with the SARS-CoV-2 virus that causes COVID-19. Institutional protocols and algorithms that pertain to the evaluation of patients at risk for COVID-19 are in a state of rapid change based on information released by regulatory bodies including the CDC and federal and state organizations. These policies and algorithms were followed during the patient's care in the ED.   Presented out of concern for irregular bleeding.  She reports heavier flow than normal for  her period.  She also complained of mild headache.  Screening labs are without significant abnormality.  Patient is noted to be mildly anemic with hemoglobin of 10.5.  This is actually above the patient's recent baseline.  Patient feels improved after ED evaluation.  Headache is resolved.  She now desires discharge home.  She does have established GYN care.  She understands need for close follow-up.  She plans on discussing case with her GYN on Monday morning.  Strict return precautions given and understood. Specific return instructions including significant bleeding or worsening bleeding, worsening lightheadedness, worsening weakness also given.  Final Clinical Impression(s) / ED Diagnoses Final diagnoses:  Vaginal bleeding  Nonintractable headache, unspecified chronicity pattern, unspecified headache type    Rx / DC Orders ED Discharge Orders     None        Valarie Merino, MD 04/15/21 [phone]

## 2021-05-03 IMAGING — US US OB TRANSVAGINAL
1 series · 15 of 28 positions shown · non-contrast
Comparison: May 25, 2020.

CLINICAL DATA: Viability of pregnancy.

EXAM:
US OB TRANSVAGINAL

[Series 1: us ob transvaginal · 53 acquisitions, 15 frames shown]
[im 1/53]
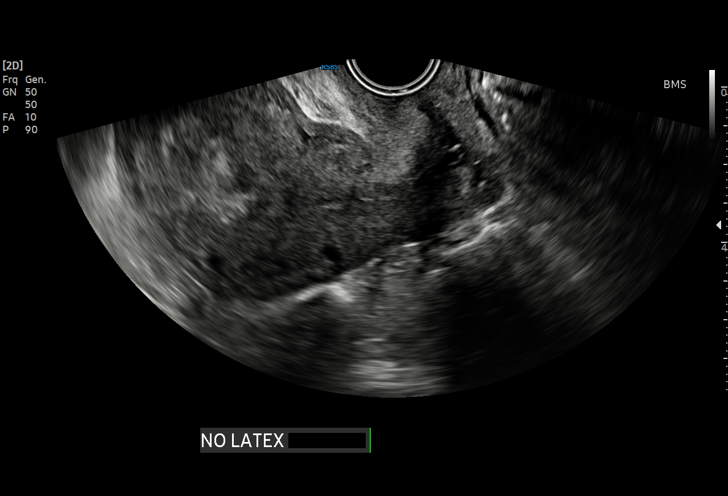
[im 4/53]
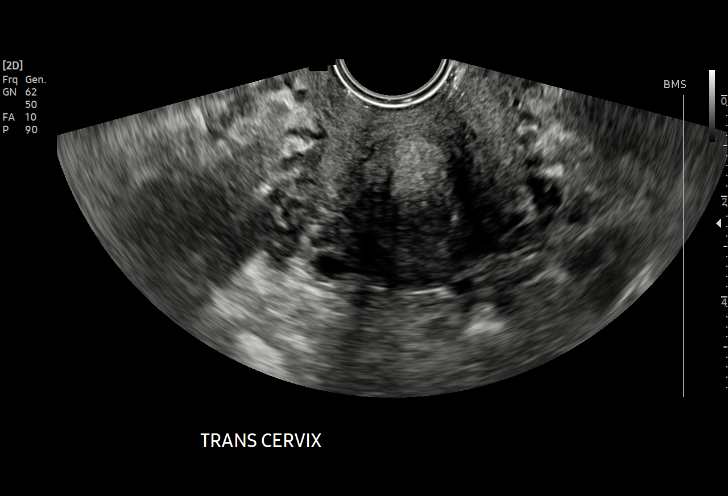
[im 8/53]
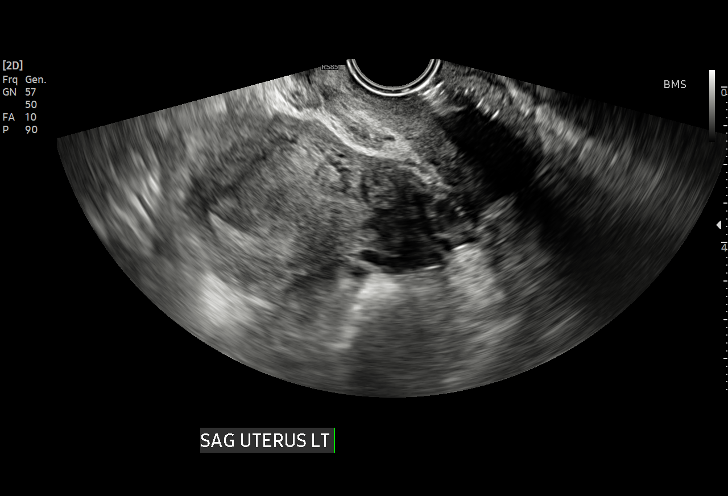
[im 12/53]
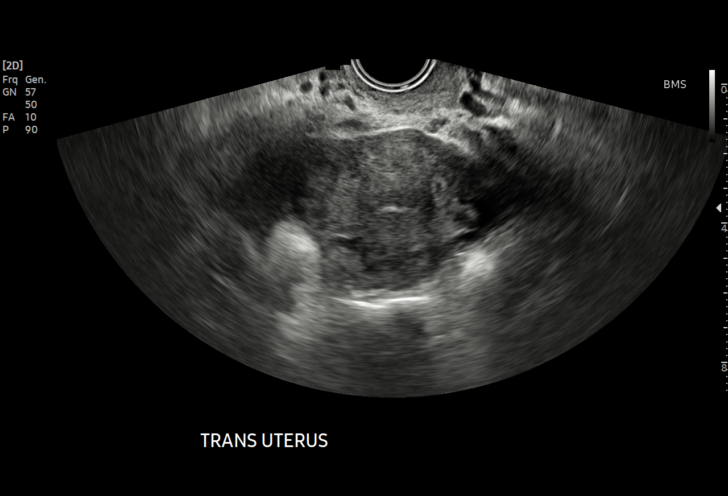
[im 16/53]
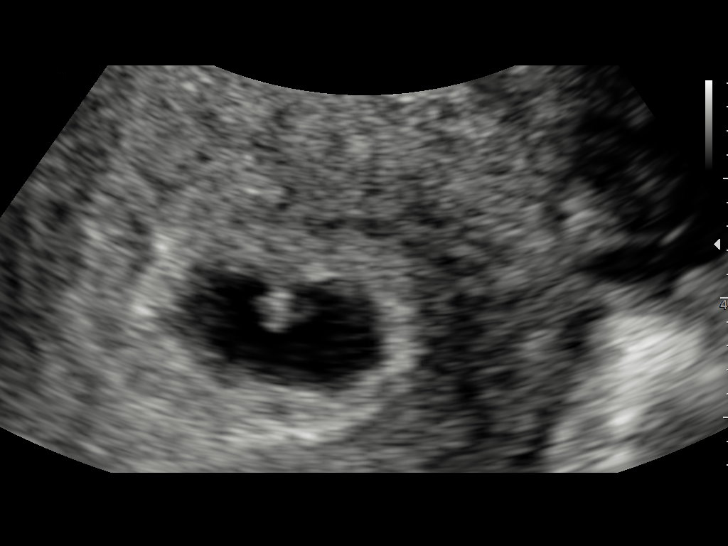
[im 20/53]
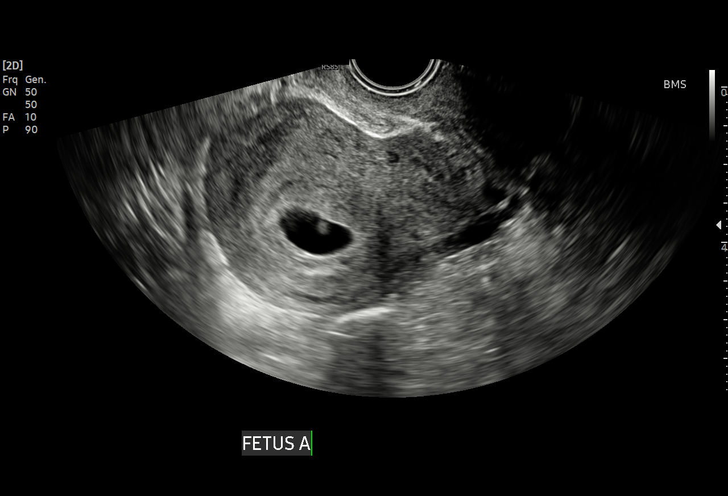
[im 24/53]
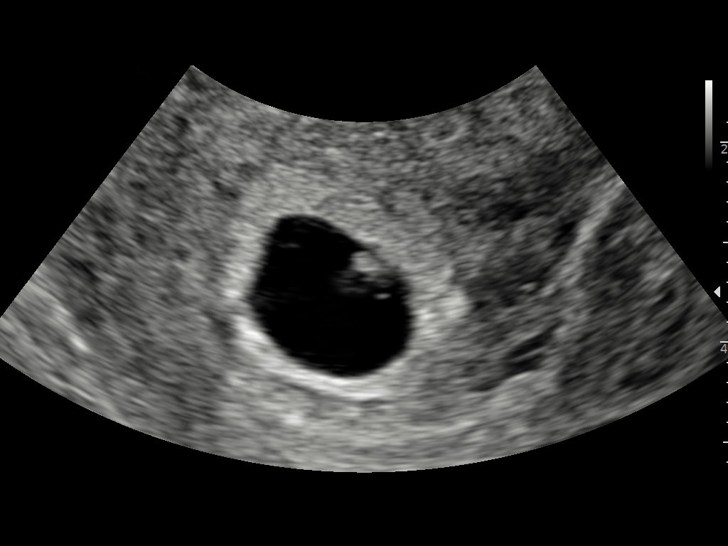
[im 27/53]
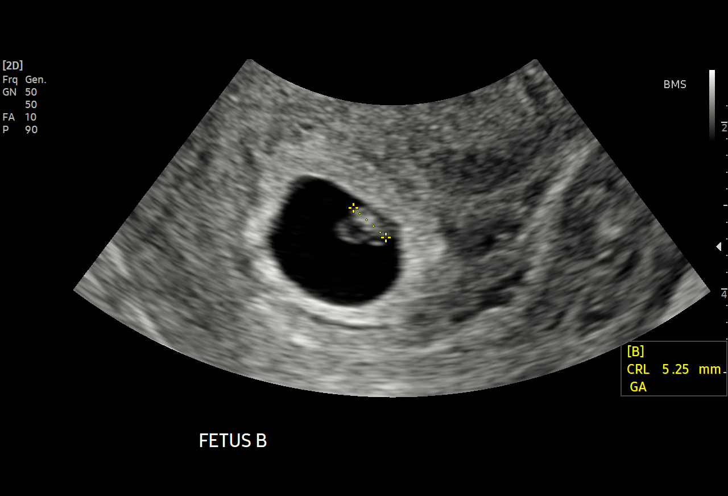
[im 29/53]
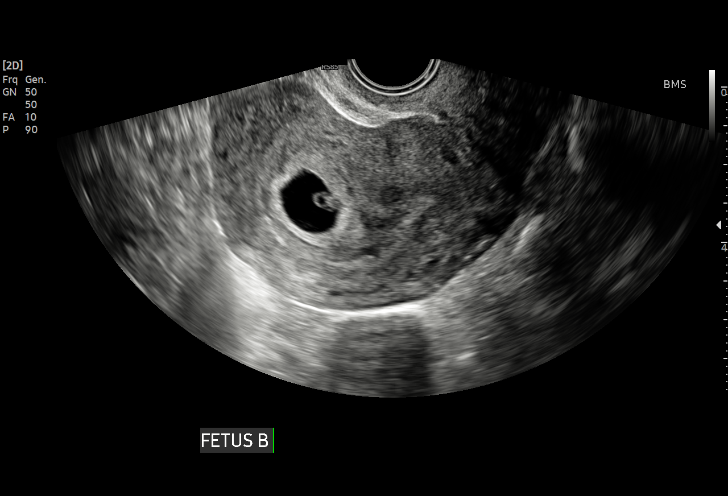
[im 33/53]
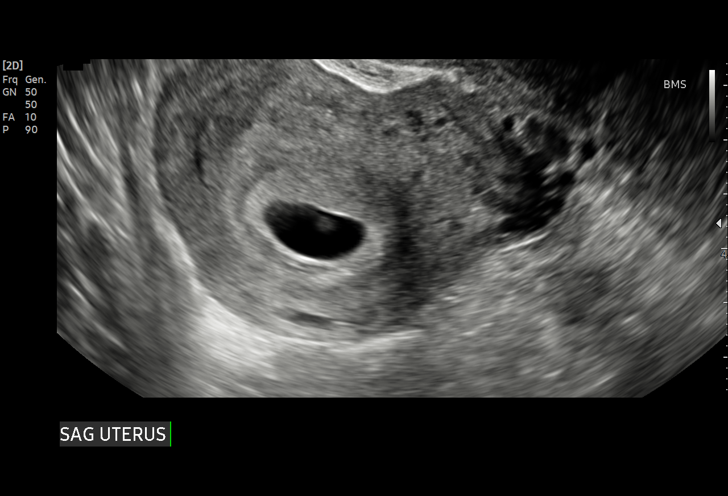
[im 37/53]
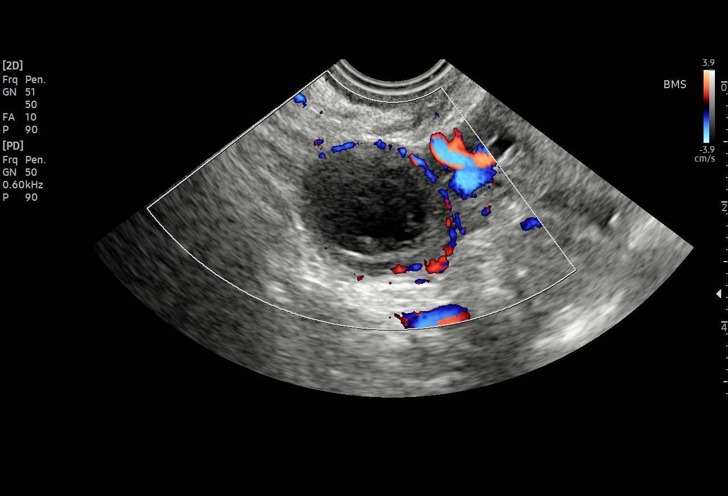
[im 41/53]
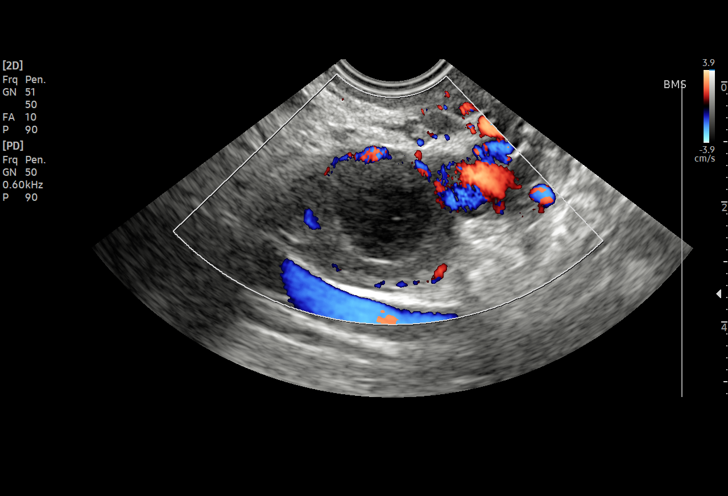
[im 45/53]
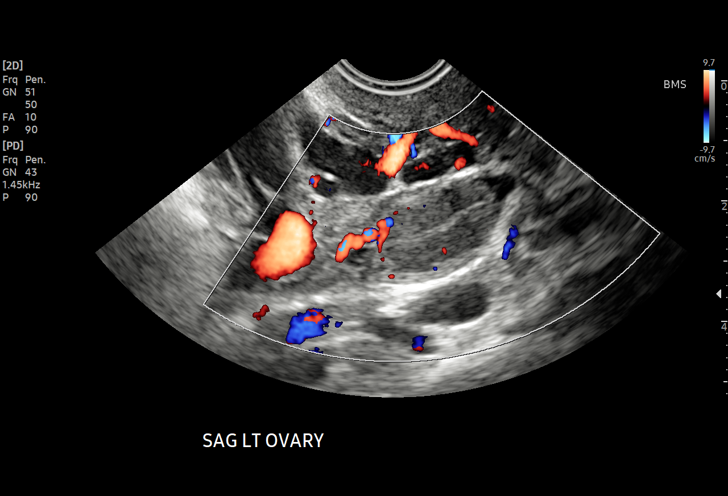
[im 49/53]
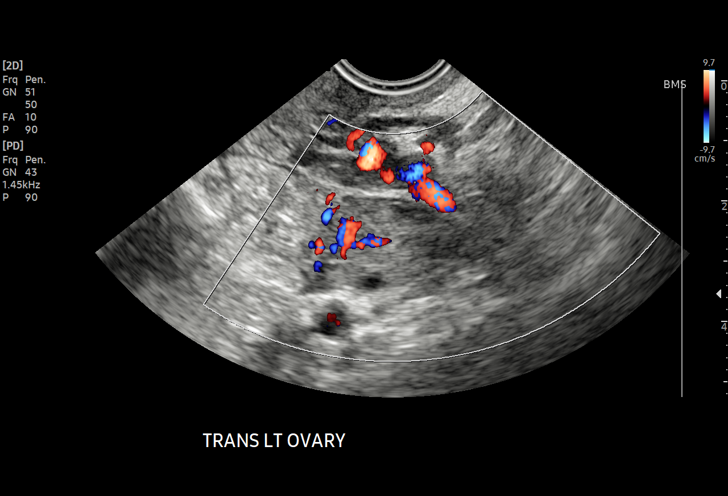
[im 53/53]
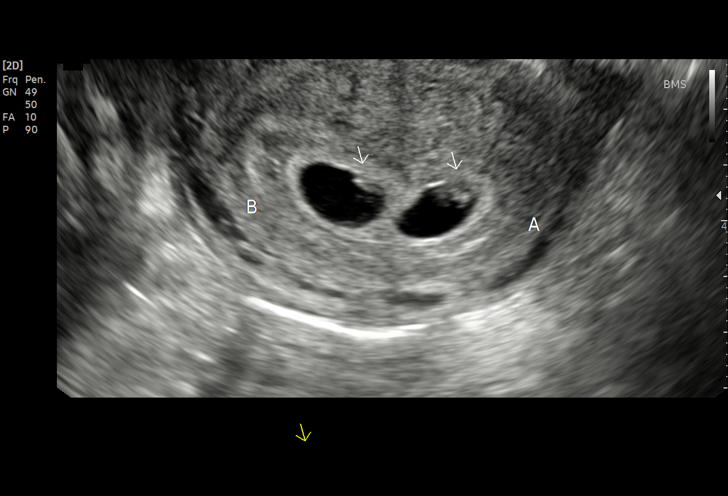

[15 of 28 positions shown; findings below may reference images not displayed]

FINDINGS: Number of IUPs:  2

Chorionicity/Amnionicity:  Dichorionic-diamniotic (thick membrane)

TWIN 1

Yolk sac:  Visualized.

Embryo:  Visualized.

Cardiac Activity: Visualized.

Heart Rate: 146 bpm

CRL:  3.5 mm   6 w 0 d                  US EDC: January 30, 2021.

TWIN 2

Yolk sac:  Visualized.

Embryo:  Visualized.

Cardiac Activity: Visualized.

Heart Rate: 123 bpm

CRL:  5.5 mm   6 w 2 d                  US EDC: January 28, 2021.

Subchorionic hemorrhage:  None visualized.

Maternal uterus/adnexae: Ovaries are unremarkable. No free fluid is
noted.
IMPRESSION: Twin live intrauterine pregnancies as described above.

## 2021-05-22 ENCOUNTER — Ambulatory Visit
Admission: EM | Admit: 2021-05-22 | Disposition: A | Discharge status: Home / Self Care | Payer: No Typology Code available for payment source

## 2021-08-21 ENCOUNTER — Other Ambulatory Visit: Payer: Self-pay

## 2021-08-21 ENCOUNTER — Emergency Department
Admission: EM | Admit: 2021-08-21 | Discharge: 2021-08-21 | Disposition: A | Discharge status: Home / Self Care | Payer: Medicare Other | Attending: Emergency Medicine | Admitting: Emergency Medicine

## 2021-08-21 ENCOUNTER — Emergency Department: Payer: Medicare Other

## 2021-08-21 DIAGNOSIS — R079 Chest pain, unspecified: Secondary | ICD-10-CM | POA: Diagnosis present

## 2021-08-21 DIAGNOSIS — Z5321 Procedure and treatment not carried out due to patient leaving prior to being seen by health care provider: Secondary | ICD-10-CM | POA: Insufficient documentation

## 2021-08-21 LAB — CBC
HCT: 34.2 % — ABNORMAL LOW (ref 36.0–46.0)
Hemoglobin: 12.1 g/dL (ref 12.0–15.0)
MCH: 27 pg (ref 26.0–34.0)
MCHC: 35.4 g/dL (ref 30.0–36.0)
MCV: 76.3 fL — ABNORMAL LOW (ref 80.0–100.0)
Platelets: 420 10*3/uL — ABNORMAL HIGH (ref 150–400)
RBC: 4.48 MIL/uL (ref 3.87–5.11)
RDW: 13.7 % (ref 11.5–15.5)
WBC: 9.2 10*3/uL (ref 4.0–10.5)
nRBC: 0 % (ref 0.0–0.2)

## 2021-08-21 LAB — BASIC METABOLIC PANEL
Anion gap: 6 (ref 5–15)
BUN: 15 mg/dL (ref 6–20)
CO2: 24 mmol/L (ref 22–32)
Calcium: 9.6 mg/dL (ref 8.9–10.3)
Chloride: 104 mmol/L (ref 98–111)
Creatinine, Ser: 0.66 mg/dL (ref 0.44–1.00)
GFR, Estimated: >60 mL/min (ref >60)
Glucose, Bld: 127 mg/dL — ABNORMAL HIGH (ref 70–99)
Potassium: 3.8 mmol/L (ref 3.5–5.1)
Sodium: 134 mmol/L — ABNORMAL LOW (ref 135–145)

## 2021-08-21 LAB — TROPONIN I (HIGH SENSITIVITY): Troponin I (High Sensitivity): 3 ng/L (ref <18)

## 2021-08-21 NOTE — ED Triage Notes (Signed)
First Nurse Note:  C/O chest pain today. 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  Denies aggravating or alleviating factors.  Skin warm and dry. NAD.  No SOB/ DOE.

## 2021-08-21 NOTE — ED Triage Notes (Signed)
Pt to ED reporting right sided chest pain radiating to the center of her chest and into her back since last night. Shortness of breath worsens when the pain worsens. No NVD, dizziness or sweating. Hx of a heart murmur but no other cardiac hx.   Pt reports having lifted something heavy at work over the weekend and is unsure if that caused the pain originally.

## 2021-10-24 ENCOUNTER — Other Ambulatory Visit: Payer: Self-pay

## 2021-10-24 ENCOUNTER — Emergency Department (HOSPITAL_COMMUNITY): Payer: Medicare Other

## 2021-10-24 ENCOUNTER — Emergency Department (HOSPITAL_COMMUNITY)
Admission: EM | Admit: 2021-10-24 | Discharge: 2021-10-24 | Disposition: A | Discharge status: Home / Self Care | Payer: Medicare Other | Attending: Emergency Medicine | Admitting: Emergency Medicine

## 2021-10-24 ENCOUNTER — Encounter (HOSPITAL_COMMUNITY): Payer: Self-pay

## 2021-10-24 DIAGNOSIS — R0602 Shortness of breath: Secondary | ICD-10-CM | POA: Diagnosis present

## 2021-10-24 DIAGNOSIS — R002 Palpitations: Secondary | ICD-10-CM | POA: Diagnosis not present

## 2021-10-24 DIAGNOSIS — R61 Generalized hyperhidrosis: Secondary | ICD-10-CM | POA: Insufficient documentation

## 2021-10-24 DIAGNOSIS — R5381 Other malaise: Secondary | ICD-10-CM | POA: Insufficient documentation

## 2021-10-24 LAB — BASIC METABOLIC PANEL
Anion gap: 8 (ref 5–15)
BUN: 12 mg/dL (ref 6–20)
CO2: 23 mmol/L (ref 22–32)
Calcium: 8.9 mg/dL (ref 8.9–10.3)
Chloride: 104 mmol/L (ref 98–111)
Creatinine, Ser: 0.51 mg/dL (ref 0.44–1.00)
GFR, Estimated: >60 mL/min (ref >60)
Glucose, Bld: 131 mg/dL — ABNORMAL HIGH (ref 70–99)
Potassium: 3.7 mmol/L (ref 3.5–5.1)
Sodium: 135 mmol/L (ref 135–145)

## 2021-10-24 LAB — CBC WITH DIFFERENTIAL/PLATELET
Abs Immature Granulocytes: 0.03 10*3/uL (ref 0.00–0.07)
Basophils Absolute: 0 10*3/uL (ref 0.0–0.1)
Basophils Relative: 0 %
Eosinophils Absolute: 0.2 10*3/uL (ref 0.0–0.5)
Eosinophils Relative: 3 %
HCT: 34.2 % — ABNORMAL LOW (ref 36.0–46.0)
Hemoglobin: 12.1 g/dL (ref 12.0–15.0)
Immature Granulocytes: 0 %
Lymphocytes Relative: 40 %
Lymphs Abs: 3 10*3/uL (ref 0.7–4.0)
MCH: 27.7 pg (ref 26.0–34.0)
MCHC: 35.4 g/dL (ref 30.0–36.0)
MCV: 78.3 fL — ABNORMAL LOW (ref 80.0–100.0)
Monocytes Absolute: 0.4 10*3/uL (ref 0.1–1.0)
Monocytes Relative: 6 %
Neutro Abs: 3.8 10*3/uL (ref 1.7–7.7)
Neutrophils Relative %: 51 %
Platelets: 417 10*3/uL — ABNORMAL HIGH (ref 150–400)
RBC: 4.37 MIL/uL (ref 3.87–5.11)
RDW: 13.1 % (ref 11.5–15.5)
WBC: 7.5 10*3/uL (ref 4.0–10.5)
nRBC: 0 % (ref 0.0–0.2)

## 2021-10-24 LAB — D-DIMER, QUANTITATIVE: D-Dimer, Quant: <0.27 ug/mL-FEU (ref 0.00–0.50)

## 2021-10-24 MED ORDER — SODIUM CHLORIDE 0.9 % IV BOLUS
500.0000 mL | Freq: Once | INTRAVENOUS | Status: AC
  Administered 2021-10-24: 500 mL via INTRAVENOUS

## 2021-10-24 NOTE — ED Triage Notes (Signed)
Pt BIB [facility] with c/o chest pain that radiates towards left shoulder, started about 30 mins ago. Pt states she feels SOB. Pt c/o left fingers feeling numb.

## 2021-10-24 NOTE — Discharge Instructions (Signed)
There is no serious problem found after their evaluation here today.  Make sure you are getting plenty of rest, drinking a lot of fluids and eating 3 meals each day.  Follow-up with your primary care doctor if not better in 2 or 3 days.

## 2021-10-24 NOTE — ED Provider Notes (Signed)
[facility] EMERGENCY DEPARTMENT Provider Note   CSN: [id number] Arrival date & time: 10/24/21  2130     History  Chief Complaint  Patient presents with   Chest Pain    Tiffany Hall is a 36 y.o. female.  HPI She presents for evaluation of shortness of breath with diaphoresis, and palpitations which started this morning as she was leaving work.  She works as an Psychologist, prison and probation services.  No prior similar problems.  She denies fever, cough, weakness or dizziness.  Home Medications Prior to Admission medications   Medication Sig Start Date End Date Taking? Authorizing Provider  Accu-Chek Softclix Lancets lancets Use as instructed Patient not taking: No sig reported 08/04/20   Shelly Bombard, MD  Blood Glucose Monitoring Suppl (ACCU-CHEK GUIDE) w/Device KIT 1 Device by Does not apply route 4 (four) times daily. Patient not taking: No sig reported 08/04/20   Shelly Bombard, MD  Blood Pressure Monitoring (BLOOD PRESSURE KIT) DEVI 1 kit by Does not apply route once a week. Check Blood Pressure regularly and record readings into the [employer] App.  Large Cuff.  DX O90.0 Patient not taking: No sig reported 07/06/20   Constant, Peggy, MD  cephALEXin (KEFLEX) 500 MG capsule Take 500 mg by mouth 4 (four) times daily. Start date : 07 /05/22 04/11/21   [provider]  Drospirenone (SLYND) 4 MG TABS Take 1 tablet by mouth daily. Patient not taking: No sig reported 02/27/21   Constant, Peggy, MD  ferrous sulfate 325 (65 FE) MG tablet Take 1 tablet (325 mg total) by mouth every other day. Patient taking differently: Take 325 mg by mouth daily. 01/08/21   Muus, Gwynn Burly, MD  glucose blood (ACCU-CHEK GUIDE) test strip Use to check blood sugars four times a day was instructed Patient not taking: No sig reported 08/04/20   Shelly Bombard, MD  ibuprofen (ADVIL) 200 MG tablet Take 400 mg by mouth every 6 (six) hours as needed.    [provider]  ibuprofen (ADVIL) 800 MG tablet  Take 1 tablet (800 mg total) by mouth every 6 (six) hours. Patient not taking: No sig reported 01/07/21   Muus, Gwynn Burly, MD  sertraline (ZOLOFT) 50 MG tablet TAKE 1 TABLET BY MOUTH EVERY DAY Patient not taking: No sig reported 03/02/21   Constant, Peggy, MD      Allergies    Amoxicillin and Nubain [nalbuphine hcl]    Review of Systems   Review of Systems  All other systems reviewed and are negative.  Physical Exam Updated Vital Signs BP 120/75    Pulse 76    Temp 98.3 F (36.8 C)    Resp 18    Ht 5' 3"  (1.6 m)    Wt 89.8 kg    SpO2 98%    BMI 35.07 kg/m  Physical Exam Vitals and nursing note reviewed.  Constitutional:      General: She is not in acute distress.    Appearance: She is well-developed. She is not ill-appearing.  HENT:     Head: Normocephalic and atraumatic.  Eyes:     Conjunctiva/sclera: Conjunctivae normal.     Pupils: Pupils are equal, round, and reactive to light.  Neck:     Trachea: Phonation normal.  Cardiovascular:     Rate and Rhythm: Normal rate and regular rhythm.  Pulmonary:     Effort: Pulmonary effort is normal. No respiratory distress.     Breath sounds: Normal breath sounds. No  stridor. No rhonchi.  Chest:     Chest wall: No tenderness.  Abdominal:     General: There is no distension.     Palpations: Abdomen is soft.     Tenderness: There is no abdominal tenderness. There is no guarding.  Musculoskeletal:        General: Normal range of motion.     Cervical back: Normal range of motion and neck supple.  Skin:    General: Skin is warm and dry.  Neurological:     Mental Status: She is alert and oriented to person, place, and time.     Motor: No abnormal muscle tone.  Psychiatric:        Mood and Affect: Mood normal.        Behavior: Behavior normal.        Thought Content: Thought content normal.        Judgment: Judgment normal.    ED Results / Procedures / Treatments   Labs (all labs ordered are listed, but only abnormal results are  displayed) Labs Reviewed  BASIC METABOLIC PANEL - Abnormal; Notable for the following components:      Result Value   Glucose, Bld 131 (*)    All other components within normal limits  CBC WITH DIFFERENTIAL/PLATELET - Abnormal; Notable for the following components:   HCT 34.2 (*)    MCV 78.3 (*)    Platelets 417 (*)    All other components within normal limits  D-DIMER, QUANTITATIVE    EKG EKG Interpretation  Date/Time:  Tuesday October 24 2021 07:01:51 EST Ventricular Rate:  70 PR Interval:  202 QRS Duration: 93 QT Interval:  367 QTC Calculation: 396 R Axis:   63 Text Interpretation: Sinus rhythm Borderline prolonged PR interval since last tracing no significant change Confirmed by Daleen Bo [phone]) on 10/24/2021 7:15:54 AM  Radiology DG Chest 2 View  Result Date: 10/24/2021 CLINICAL DATA:  Chest pressure and dyspnea EXAM: CHEST - 2 VIEW COMPARISON:  08/21/2021 chest radiograph. FINDINGS: Stable cardiomediastinal silhouette with normal heart size. No pneumothorax. No pleural effusion. Lungs appear clear, with no acute consolidative airspace disease and no pulmonary edema. IMPRESSION: No active cardiopulmonary disease. Electronically Signed   By: Ilona Sorrel M.D.   On: 10/24/2021 08:10    Procedures Procedures    Medications Ordered in ED Medications  sodium chloride 0.9 % bolus 500 mL (0 mLs Intravenous Stopped 10/24/21 1002)    ED Course/ Medical Decision Making/ A&P                           Medical Decision Making She presents to the ED by emergency medical system transport for evaluation of nonspecific symptoms which occurred as she was leaving work this morning.  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  Problems Addressed: Malaise: acute illness or injury    Details: Concerning for cardiac or systemic abnormalities.  Amount and/or Complexity of Data Reviewed Labs: ordered.    Details: CBC, metabolic  panel, D-dimer; all results are normal Radiology: ordered.    Details: Chest x-ray, normal ECG/medicine tests: ordered.    Details: Cardiac monitor with normal sinus rhythm  Risk OTC drugs. Decision regarding hospitalization. Risk Details: Patient with signs and symptoms of panic attack, stress reaction and malaise.  She works night shift.  Screening evaluation does not indicate unstable acute cardiac or neurologic abnormalities.  No indication for hospitalization or further ED  intervention at this time.           Final Clinical Impression(s) / ED Diagnoses Final diagnoses:  Malaise    Rx / DC Orders ED Discharge Orders     None         Daleen Bo, MD 10/24/21 2031

## 2021-11-13 ENCOUNTER — Encounter (HOSPITAL_COMMUNITY): Payer: Self-pay | Admitting: Emergency Medicine

## 2021-11-13 ENCOUNTER — Emergency Department (HOSPITAL_COMMUNITY)
Admission: EM | Admit: 2021-11-13 | Discharge: 2021-11-13 | Disposition: A | Discharge status: Home / Self Care | Payer: Medicare Other | Attending: Emergency Medicine | Admitting: Emergency Medicine

## 2021-11-13 ENCOUNTER — Emergency Department (HOSPITAL_COMMUNITY): Payer: Medicare Other

## 2021-11-13 DIAGNOSIS — R519 Headache, unspecified: Secondary | ICD-10-CM | POA: Diagnosis present

## 2021-11-13 DIAGNOSIS — H538 Other visual disturbances: Secondary | ICD-10-CM | POA: Diagnosis not present

## 2021-11-13 DIAGNOSIS — Z8669 Personal history of other diseases of the nervous system and sense organs: Secondary | ICD-10-CM | POA: Insufficient documentation

## 2021-11-13 LAB — COMPREHENSIVE METABOLIC PANEL
ALT: 17 U/L (ref 0–44)
AST: 16 U/L (ref 15–41)
Albumin: 3.6 g/dL (ref 3.5–5.0)
Alkaline Phosphatase: 65 U/L (ref 38–126)
Anion gap: 5 (ref 5–15)
BUN: 11 mg/dL (ref 6–20)
CO2: 23 mmol/L (ref 22–32)
Calcium: 8.6 mg/dL — ABNORMAL LOW (ref 8.9–10.3)
Chloride: 106 mmol/L (ref 98–111)
Creatinine, Ser: 0.53 mg/dL (ref 0.44–1.00)
GFR, Estimated: >60 mL/min (ref >60)
Glucose, Bld: 153 mg/dL — ABNORMAL HIGH (ref 70–99)
Potassium: 3.5 mmol/L (ref 3.5–5.1)
Sodium: 134 mmol/L — ABNORMAL LOW (ref 135–145)
Total Bilirubin: 0.2 mg/dL — ABNORMAL LOW (ref 0.3–1.2)
Total Protein: 7.4 g/dL (ref 6.5–8.1)

## 2021-11-13 LAB — CBC WITH DIFFERENTIAL/PLATELET
Abs Immature Granulocytes: 0.01 10*3/uL (ref 0.00–0.07)
Basophils Absolute: 0.1 10*3/uL (ref 0.0–0.1)
Basophils Relative: 1 %
Eosinophils Absolute: 0.3 10*3/uL (ref 0.0–0.5)
Eosinophils Relative: 5 %
HCT: 34.4 % — ABNORMAL LOW (ref 36.0–46.0)
Hemoglobin: 11.8 g/dL — ABNORMAL LOW (ref 12.0–15.0)
Immature Granulocytes: 0 %
Lymphocytes Relative: 46 %
Lymphs Abs: 3.3 10*3/uL (ref 0.7–4.0)
MCH: 26.9 pg (ref 26.0–34.0)
MCHC: 34.3 g/dL (ref 30.0–36.0)
MCV: 78.5 fL — ABNORMAL LOW (ref 80.0–100.0)
Monocytes Absolute: 0.4 10*3/uL (ref 0.1–1.0)
Monocytes Relative: 6 %
Neutro Abs: 2.9 10*3/uL (ref 1.7–7.7)
Neutrophils Relative %: 42 %
Platelets: 415 10*3/uL — ABNORMAL HIGH (ref 150–400)
RBC: 4.38 MIL/uL (ref 3.87–5.11)
RDW: 12.9 % (ref 11.5–15.5)
WBC: 7 10*3/uL (ref 4.0–10.5)
nRBC: 0 % (ref 0.0–0.2)

## 2021-11-13 LAB — HCG, QUANTITATIVE, PREGNANCY: hCG, Beta Chain, Quant, S: <1 m[IU]/mL (ref <5)

## 2021-11-13 MED ORDER — DEXAMETHASONE SODIUM PHOSPHATE 10 MG/ML IJ SOLN
10.0000 mg | Freq: Once | INTRAMUSCULAR | Status: AC
  Administered 2021-11-13: 10 mg via INTRAVENOUS
  Filled 2021-11-13: qty 1

## 2021-11-13 MED ORDER — PROCHLORPERAZINE EDISYLATE 10 MG/2ML IJ SOLN
10.0000 mg | Freq: Once | INTRAMUSCULAR | Status: AC
Start: 2021-11-13 — End: 2021-11-13
  Administered 2021-11-13: 10 mg via INTRAVENOUS
  Filled 2021-11-13: qty 2

## 2021-11-13 MED ORDER — DIPHENHYDRAMINE HCL 50 MG/ML IJ SOLN
25.0000 mg | Freq: Once | INTRAMUSCULAR | Status: AC
  Administered 2021-11-13: 25 mg via INTRAVENOUS
  Filled 2021-11-13: qty 1

## 2021-11-13 MED ORDER — IOHEXOL 350 MG/ML SOLN
75.0000 mL | Freq: Once | INTRAVENOUS | Status: AC | PRN
  Administered 2021-11-13: 75 mL via INTRAVENOUS

## 2021-11-13 MED ORDER — LACTATED RINGERS IV BOLUS
1000.0000 mL | Freq: Once | INTRAVENOUS | Status: AC
  Administered 2021-11-13: 1000 mL via INTRAVENOUS

## 2021-11-13 NOTE — ED Triage Notes (Signed)
Pt c/o headache and HTN since last night. Pt also c/o left sided neck pain and blurred vision.

## 2021-11-13 NOTE — ED Provider Notes (Signed)
[facility] EMERGENCY DEPARTMENT Provider Note   CSN: [id number] Arrival date & time: 11/13/21  0147     History  Chief Complaint  Patient presents with   Headache    Tiffany Hall is a 36 y.o. female.  36 year old female with a history of migraines presents the emergency department today with a headache.  Patient states is unlike any headache she is ever had.  It started in the back but then radiated towards the front and then went around her left eye down into her left neck.  She states that she had some blurry vision at some point but not at this point.  No other neurologic symptoms.  Eating drinking normally.  No fevers.  No trauma.  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  It started about 24 hours prior to that.  No exacerbating relieving factors.  Tried some Tylenol at home which did not help   Headache     Home Medications Prior to Admission medications   Medication Sig Start Date End Date Taking? Authorizing Provider  Accu-Chek Softclix Lancets lancets Use as instructed Patient not taking: No sig reported 08/04/20   Shelly Bombard, MD  Blood Glucose Monitoring Suppl (ACCU-CHEK GUIDE) w/Device KIT 1 Device by Does not apply route 4 (four) times daily. Patient not taking: No sig reported 08/04/20   Shelly Bombard, MD  Blood Pressure Monitoring (BLOOD PRESSURE KIT) DEVI 1 kit by Does not apply route once a week. Check Blood Pressure regularly and record readings into the Babys[employer] App.  Large Cuff.  DX O90.0 Patient not taking: No sig reported 07/06/20   Constant, Peggy, MD  cephALEXin (KEFLEX) 500 MG capsule Take 500 mg by mouth 4 (four) times daily. Start date : 07 /05/22 04/11/21   [provider]  Drospirenone (SLYND) 4 MG TABS Take 1 tablet by mouth daily. Patient not taking: No sig reported 02/27/21   Constant, Peggy, MD  ferrous sulfate 325 (65 FE) MG tablet Take 1 tablet (325 mg  total) by mouth every other day. Patient taking differently: Take 325 mg by mouth daily. 01/08/21   Muus, Gwynn Burly, MD  glucose blood (ACCU-CHEK GUIDE) test strip Use to check blood sugars four times a day was instructed Patient not taking: No sig reported 08/04/20   Shelly Bombard, MD  ibuprofen (ADVIL) 200 MG tablet Take 400 mg by mouth every 6 (six) hours as needed.    [provider]  ibuprofen (ADVIL) 800 MG tablet Take 1 tablet (800 mg total) by mouth every 6 (six) hours. Patient not taking: No sig reported 01/07/21   Muus, Gwynn Burly, MD  sertraline (ZOLOFT) 50 MG tablet TAKE 1 TABLET BY MOUTH EVERY DAY Patient not taking: No sig reported 03/02/21   Constant, Peggy, MD      Allergies    Amoxicillin and Nubain [nalbuphine hcl]    Review of Systems   Review of Systems  Neurological:  Positive for headaches.   Physical Exam Updated Vital Signs BP 120/78 (BP Location: Left Arm)    Pulse 72    Temp 98.4 F (36.9 C) (Oral)    Resp 16    Ht 5' 3"  (1.6 m)    Wt 89.8 kg    SpO2 99%    BMI 35.07 kg/m  Physical Exam Vitals and nursing note reviewed.  Constitutional:      Appearance: She  is well-developed.  HENT:     Head: Normocephalic and atraumatic.  Eyes:     General: No visual field deficit.    Extraocular Movements: Extraocular movements intact.     Pupils: Pupils are equal, round, and reactive to light.  Cardiovascular:     Rate and Rhythm: Normal rate and regular rhythm.  Pulmonary:     Effort: No respiratory distress.     Breath sounds: No stridor.  Abdominal:     General: There is no distension.  Musculoskeletal:     Cervical back: Normal range of motion.  Neurological:     Mental Status: She is alert and oriented to person, place, and time.     GCS: GCS eye subscore is 4. GCS verbal subscore is 5. GCS motor subscore is 6.     Cranial Nerves: Cranial nerve deficit present. No dysarthria or facial asymmetry.     Sensory: No sensory deficit.     Motor: No weakness.      Coordination: Coordination normal.     Comments: No altered mental status, able to give full seemingly accurate history.  Face is symmetric, EOM's intact, pupils equal and reactive, vision intact, tongue and uvula midline without deviation. Upper and Lower extremity motor 5/5, intact pain perception in distal extremities, 2+ reflexes in biceps, patella and achilles tendons. Able to perform finger to nose normal with both hands. Walks without assistance or evident ataxia.      ED Results / Procedures / Treatments   Labs (all labs ordered are listed, but only abnormal results are displayed) Labs Reviewed  CBC WITH DIFFERENTIAL/PLATELET - Abnormal; Notable for the following components:      Result Value   Hemoglobin 11.8 (*)    HCT 34.4 (*)    MCV 78.5 (*)    Platelets 415 (*)    All other components within normal limits  COMPREHENSIVE METABOLIC PANEL - Abnormal; Notable for the following components:   Sodium 134 (*)    Glucose, Bld 153 (*)    Calcium 8.6 (*)    Total Bilirubin 0.2 (*)    All other components within normal limits  HCG, QUANTITATIVE, PREGNANCY    EKG None  Radiology CT ANGIO HEAD NECK W WO CM  Result Date: 11/13/2021 CLINICAL DATA:  Stroke suspected dizziness, persistent/recurrent, left-sided neck pain, blurred vision EXAM: CT ANGIOGRAPHY HEAD AND NECK TECHNIQUE: Multidetector CT imaging of the head and neck was performed using the standard protocol during bolus administration of intravenous contrast. Multiplanar CT image reconstructions and MIPs were obtained to evaluate the vascular anatomy. Carotid stenosis measurements (when applicable) are obtained utilizing NASCET criteria, using the distal internal carotid diameter as the denominator. RADIATION DOSE REDUCTION: This exam was performed according to the departmental dose-optimization program which includes automated exposure control, adjustment of the mA and/or kV according to patient size and/or use of iterative  reconstruction technique. CONTRAST:  61m OMNIPAQUE IOHEXOL 350 MG/ML SOLN COMPARISON:  12/27/2009 CT head, no prior CTA FINDINGS: CT HEAD FINDINGS Brain: No evidence of acute infarction, hemorrhage, cerebral edema, mass, mass effect, or midline shift. No hydrocephalus or extra-axial fluid collection. Vascular: No hyperdense vessel. Skull: Normal. Negative for fracture or focal lesion. Sinuses/Orbits: Mucosal thickening in the ethmoid air cells. The orbits are unremarkable. Other: The mastoid air cells are well aerated. CTA NECK FINDINGS Aortic arch: Two-vessel arch with a common origin of the brachiocephalic and left common carotid arteries. Imaged portion shows no evidence of aneurysm or dissection. No significant stenosis of  the major arch vessel origins. Right carotid system: No stenosis, dissection, or occlusion Left carotid system: No stenosis, dissection, or occlusion. Vertebral arteries: Right dominant. No stenosis, dissection, or occlusion. Skeleton: No acute osseous abnormality. Other neck: Negative. Upper chest: Negative. Review of the MIP images confirms the above findings CTA HEAD FINDINGS Anterior circulation: Both internal carotid arteries are patent to the termini, without significant stenosis. A1 segments patent, with a hypoplastic right A1. Normal anterior communicating artery. Anterior cerebral arteries are patent to their distal aspects. No M1 stenosis or occlusion. Normal MCA bifurcations. Distal MCA branches perfused and symmetric. Posterior circulation: Vertebral arteries patent to the vertebrobasilar junction without stenosis. The left vertebral artery primarily supplies PICA, with a diminutive remainder of the left V4 segment. Posterior inferior cerebral arteries patent bilaterally. Basilar patent to its distal aspect. Superior cerebellar arteries patent bilaterally. Patent left P1 segment. No definite right P1 segment. The bilateral posterior communicating arteries are patent, with a fetal  origin of the right PCA. PCAs perfused to their distal aspects without stenosis. Venous sinuses: As permitted by contrast timing, patent. Anatomic variants: Fetal origin of the right PCA. Review of the MIP images confirms the above findings IMPRESSION: 1.  No acute intracranial process. 2.  No intracranial large vessel occlusion or significant stenosis. 3.  No hemodynamically significant stenosis in the neck. Electronically Signed   By: Merilyn Baba M.D.   On: 11/13/2021 03:30    Procedures Procedures    Medications Ordered in ED Medications  prochlorperazine (COMPAZINE) injection 10 mg (10 mg Intravenous Given 11/13/21 0259)  diphenhydrAMINE (BENADRYL) injection 25 mg (25 mg Intravenous Given 11/13/21 0259)  lactated ringers bolus 1,000 mL (0 mLs Intravenous Stopped 11/13/21 0418)  dexamethasone (DECADRON) injection 10 mg (10 mg Intravenous Given 11/13/21 0300)  iohexol (OMNIPAQUE) 350 MG/ML injection 75 mL (75 mLs Intravenous Contrast Given 11/13/21 0307)    ED Course/ Medical Decision Making/ A&P                           Medical Decision Making Amount and/or Complexity of Data Reviewed Labs: ordered. Radiology: ordered.  Risk Prescription drug management.   Suspect is likely migraine headache however with the pain that was atypical area and radiating down her left neck I thought it prudent to evaluate for vertebral or carotid artery dissection and these were negative on CTA.  Her headache cocktail improved her symptoms.  Suspect in the end that it was a new type of migraine or generalized headache.  Low suspicion for meningitis or subarachnoid or any other emergent etiology at this time.   Final Clinical Impression(s) / ED Diagnoses Final diagnoses:  Acute nonintractable headache, unspecified headache type    Rx / DC Orders ED Discharge Orders     None         Jyl Chico, Corene Cornea, MD 11/13/21 [phone]

## 2021-11-13 NOTE — ED Notes (Signed)
Patient transported to CT 

## 2021-11-13 NOTE — ED Notes (Signed)
ED Provider at bedside. 

## 2021-11-29 IMAGING — US US MFM FETAL BPP W/O NON-STRESS
1 series · 14 of 28 positions shown · non-contrast
Comparison: none

[Series 1: us mfm fetal bpp w/o non-stress · 53 acquisitions, 14 frames shown]
[im 2/53]
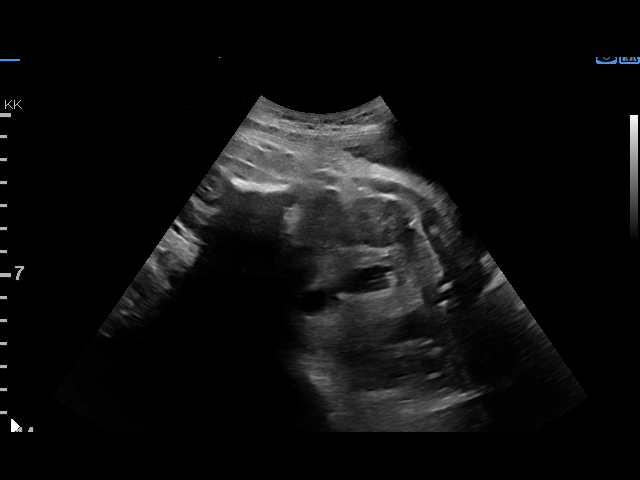
[im 6/53]
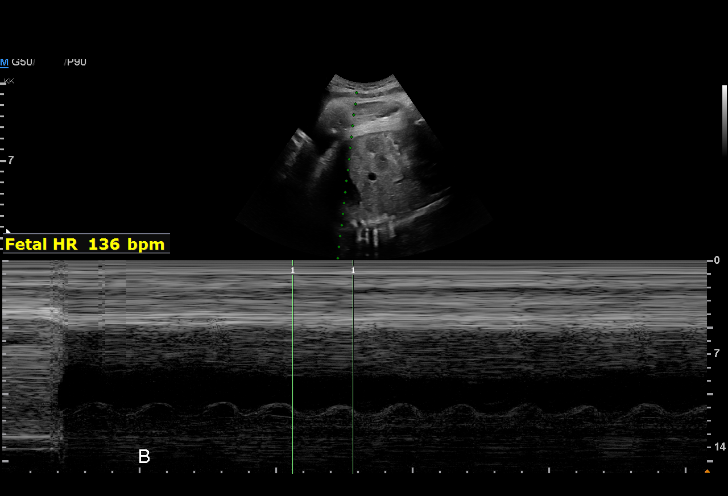
[im 10/53]
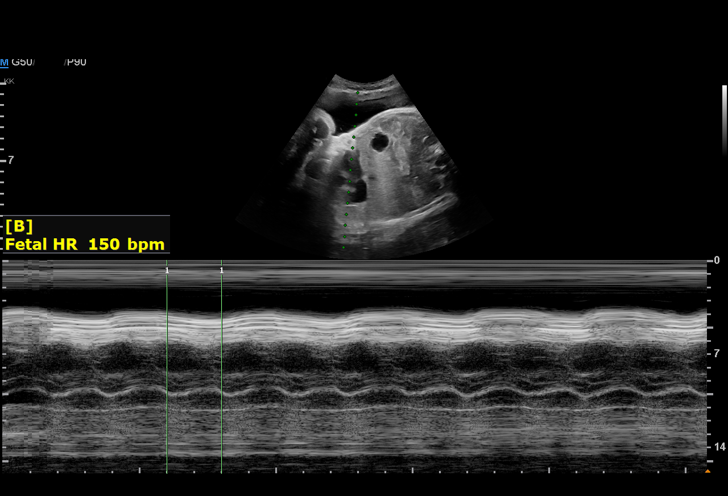
[im 14/53]
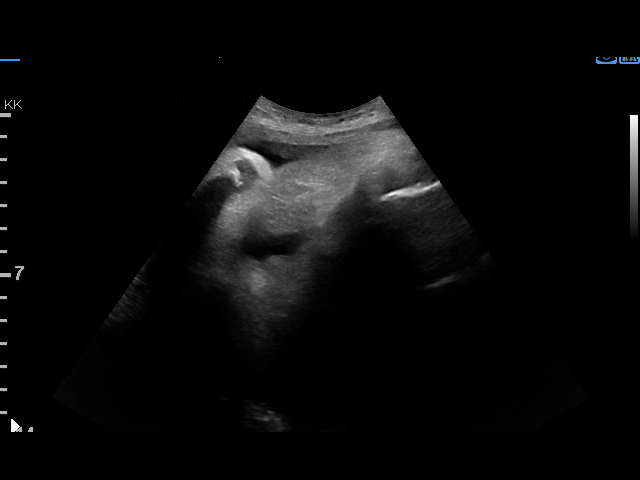
[im 18/53]
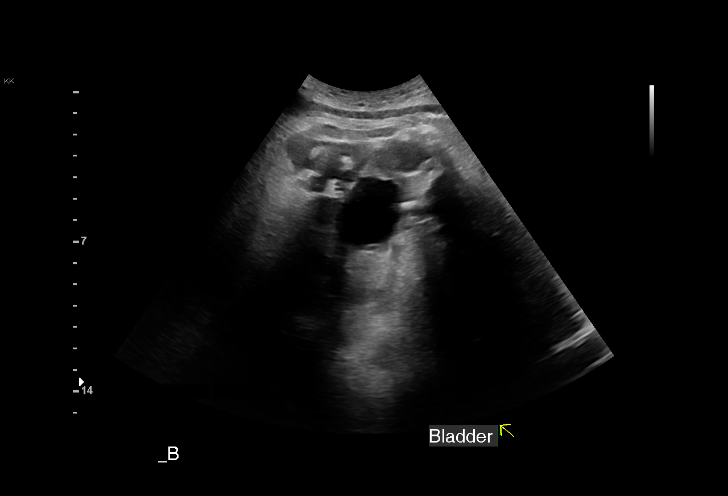
[im 22/53]
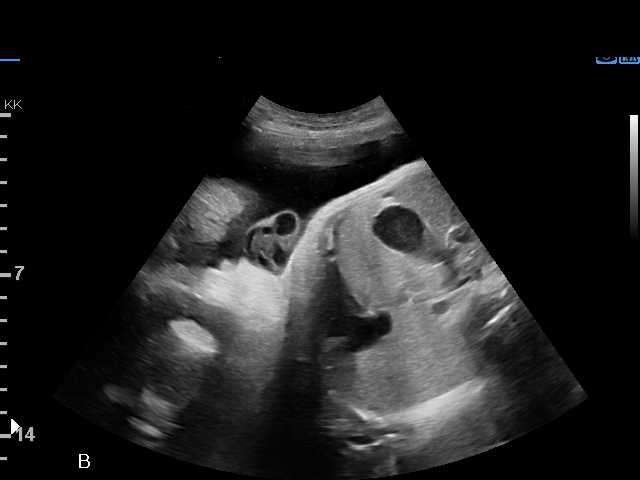
[im 26/53]
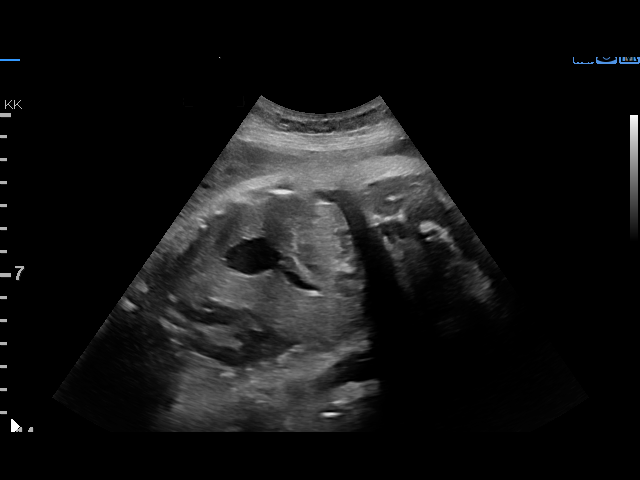
[im 29/53]
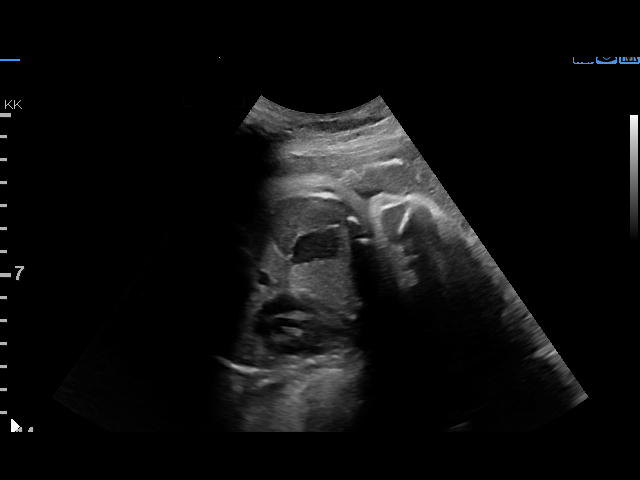
[im 33/53]
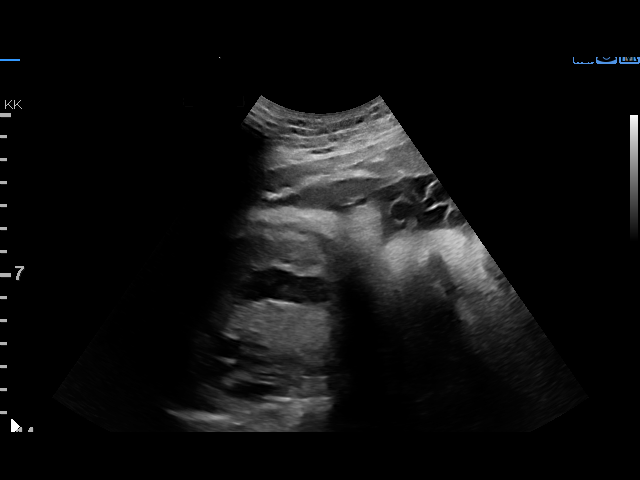
[im 37/53]
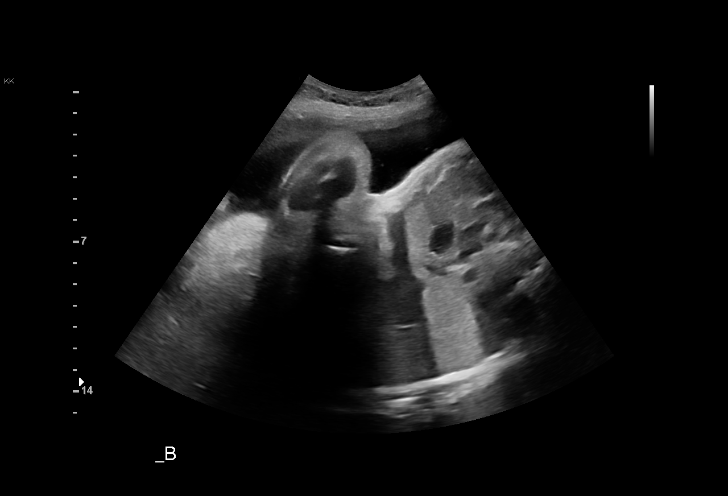
[im 41/53]
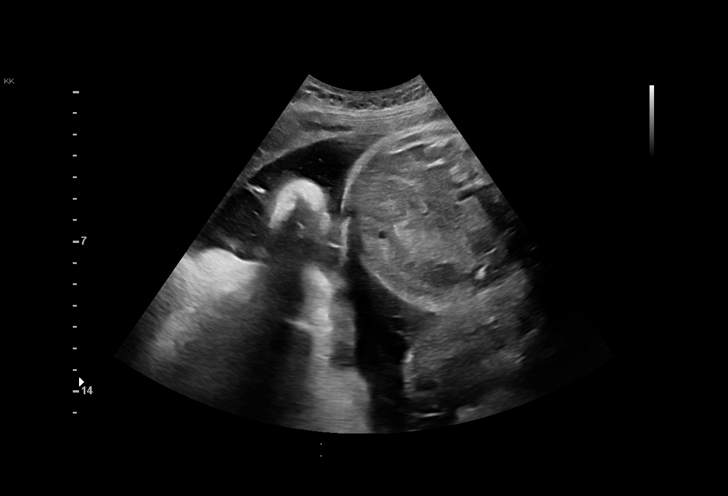
[im 45/53]
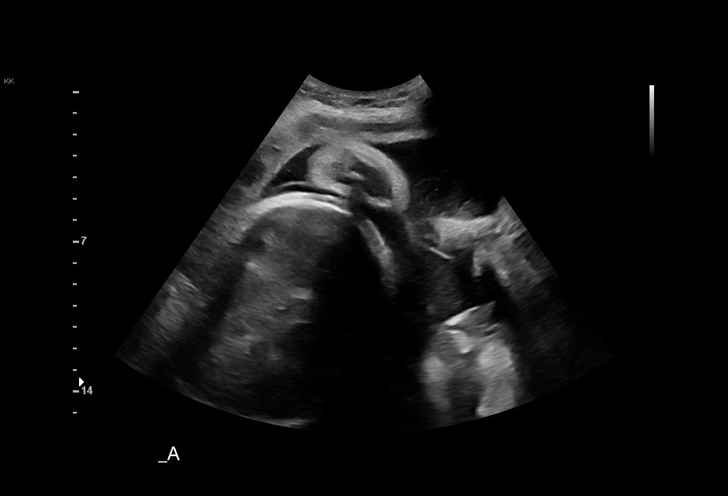
[im 49/53]
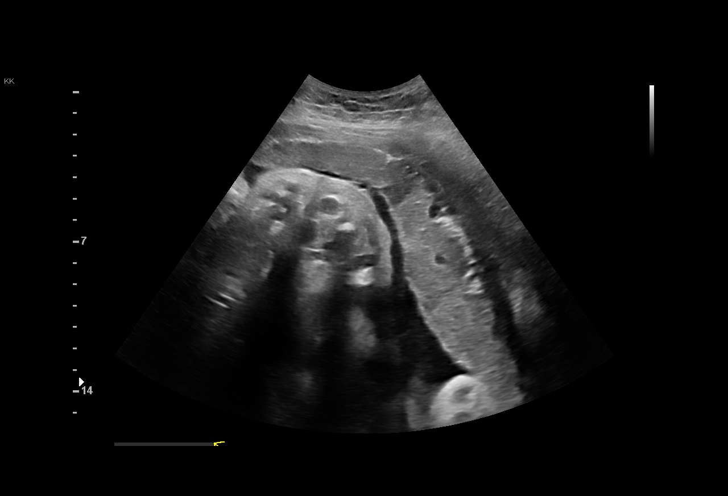
[im 53/53]
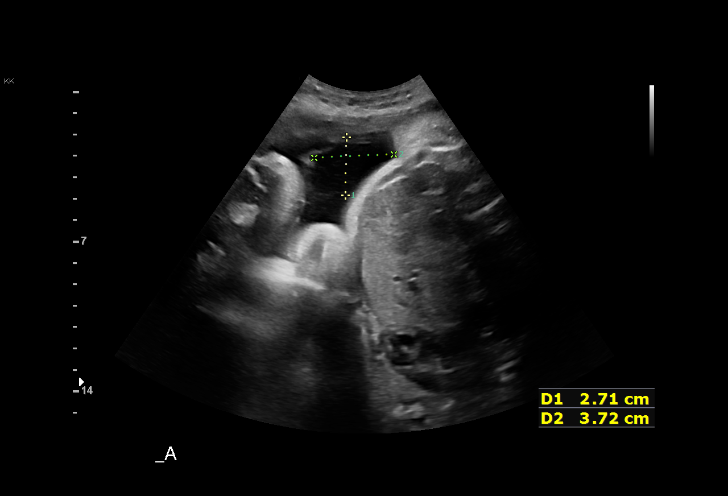

[14 of 28 positions shown; findings below may reference images not displayed]

ADDL GESTATION

Indications

 36 weeks gestation of pregnancy
 Twin pregnancy, di/di, third trimester
 Diabetes - Pregestational,3rd
 trimester(Insulin)
 Obesity complicating pregnancy, third
 trimester (BMI 32)
 Genetic carrier (Hemoglobin C)
 Low risk NIPS
 Advanced maternal age multigravida 35+,
 third trimester
Fetal Evaluation (Fetus A)

 Num Of Fetuses:         2
 Fetal Heart Rate(bpm):  127
 Cardiac Activity:       Observed
 Fetal Lie:              Maternal right side
 Presentation:           Cephalic
 Placenta:               Anterior
 P. Cord Insertion:      Previously Visualized
 Membrane Desc:      Dividing Membrane seen - Dichorionic.
 Amniotic Fluid
 AFI FV:      Within normal limits

                             Largest Pocket(cm)

Biophysical Evaluation (Fetus A)

 Amniotic F.V:   Pocket => 2 cm             F. Tone:        Observed
 F. Movement:    Observed                   Score:          [DATE]
 F. Breathing:   Observed
OB History

 Gravidity:    7         Term:   5        Prem:   0        SAB:   1
 TOP:          0       Ectopic:  0        Living: 5
Gestational Age (Fetus A)

 LMP:           36w 4d        Date:  04/21/20                 EDD:   01/26/21
 Best:          36w 4d     Det. By:  LMP  (04/21/20)          EDD:   01/26/21
Anatomy (Fetus A)

 Stomach:               Appears normal, left   Bladder:                Appears normal
                        sided

Fetal Evaluation (Fetus B)

 Num Of Fetuses:         2
 Fetal Heart Rate(bpm):  150
 Cardiac Activity:       Observed
 Fetal Lie:              Maternal left side
 Presentation:           Breech
 Placenta:               Left lateral
 P. Cord Insertion:      Previously Visualized
 Membrane Desc:      Dividing Membrane seen - Dichorionic.

 Amniotic Fluid
 AFI FV:      Within normal limits

                             Largest Pocket(cm)

Biophysical Evaluation (Fetus B)

 Amniotic F.V:   Pocket => 2 cm             F. Tone:        Observed
 F. Movement:    Observed                   Score:          [DATE]
 F. Breathing:   Observed
Gestational Age (Fetus B)

 LMP:           36w 4d        Date:  04/21/20                 EDD:   01/26/21
 Best:          36w 4d     Det. By:  LMP  (04/21/20)          EDD:   01/26/21
Anatomy (Fetus B)
 Stomach:               Appears normal, left   Bladder:                Appears normal
                        sided
Impression

 Dichorionic-diamniotic twin pregnancy.
 Pregestational diabetes.  Patient reports her fasting levels are
 in the 140s and postprandial levels are in the 180s.  Diabetes
 is not well controlled.
 Twin A: Maternal right, cephalic presentation, anterior
 placenta.  Amniotic fluid is normal good fetal activity seen.
 Antenatal testing is reassuring.  BPP [DATE].
 Twin B: Maternal left, breech presentation, left lateral
 placenta. Amniotic fluid is normal good fetal activity seen.
 Antenatal testing is reassuring.  BPP [DATE].
 I have reassured the patient of the findings.
 I did not review her blood glucose log today.  Poorly
 controlled diabetes is associated with increased perinatal
 mortality and morbidity.
 I recommended delivery at 37 weeks gestation.  Patient
 agreed with my recommendations.
 She has an prenatal visit appointment today.
Recommendations

 Delivery at 37 weeks' gestation.
                 Jumper, Nya

## 2021-12-02 ENCOUNTER — Ambulatory Visit
Admission: EM | Admit: 2021-12-02 | Discharge: 2021-12-02 | Disposition: A | Discharge status: Home / Self Care | Payer: Medicare Other | Attending: Family Medicine | Admitting: Family Medicine

## 2021-12-02 DIAGNOSIS — R112 Nausea with vomiting, unspecified: Secondary | ICD-10-CM | POA: Diagnosis not present

## 2021-12-02 DIAGNOSIS — R197 Diarrhea, unspecified: Secondary | ICD-10-CM

## 2021-12-02 MED ORDER — PROMETHAZINE HCL 25 MG PO TABS: 25.0000 mg | ORAL_TABLET | Freq: Four times a day (QID) | ORAL | 0 refills | Status: DC | PRN

## 2021-12-02 MED ORDER — SUCRALFATE 1 G PO TABS: 1.0000 g | ORAL_TABLET | Freq: Three times a day (TID) | ORAL | 0 refills | Status: DC | PRN

## 2021-12-02 NOTE — ED Provider Notes (Signed)
[facility]    CSN: [id number] Arrival date & time: 12/02/21  0931      History   Chief Complaint Chief Complaint  Patient presents with   Abdominal Pain    Vomit and nausea with stomach pain    HPI Tiffany Hall is a 36 y.o. female.   Presenting today with 2-day history of nausea, 1 episode of vomiting, 1 episode of diarrhea and low-grade fever, headache.  Initially had some sharp upper abdominal pains but those resolved after her episode of vomiting and she has just had nausea and decreased appetite since.  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  So far trying Zofran with minimal relief.  Denies chance of pregnancy, status post tubal ligation and on oral contraceptives.   Past Medical History:  Diagnosis Date   Anemia    Anxiety    Depression    GERD (gastroesophageal reflux disease)    medication not needed   Gestational diabetes    insulin   Heart murmur    History of abnormal cervical Pap smear    2009   History of cardiac murmur as a child    History of chlamydia    2008   History of gallstones    2012   History of gonorrhea    2008   History of ovarian cyst    History of trichomonal vaginitis    2008   HSV (herpes simplex virus) anogenital infection    Hypertension    PIH with pregnancy   Migraine headache    Preeclampsia in postpartum period 01/15/2015   Pregnancy induced hypertension    Sickle cell trait ([place])    Vaginal Pap smear, abnormal    HPV +    Patient Active Problem List   Diagnosis Date Noted   Encounter for induction of labor 01/05/2021   Cesarean delivery delivered 01/05/2021   History of bilateral tubal ligation 01/05/2021   Unwanted fertility 11/02/2020   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, antepartum, first trimester 09/09/2020   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   Pre-existing diabetes mellitus affecting pregnancy, antepartum 07/15/2020   Twin pregnancy, antepartum condition or  complication 97/41/6384   Maternal obesity affecting pregnancy, antepartum 07/13/2020   History of pre-eclampsia in prior pregnancy, currently pregnant 07/13/2020   Supervision of high-risk pregnancy 07/05/2020   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   Migraine 02/25/2018    Past Surgical History:  Procedure Laterality Date   CESAREAN SECTION MULTI-GESTATIONAL N/A 01/05/2021   Procedure: CESAREAN SECTION MULTI-GESTATIONAL;  Surgeon: Mora Bellman, MD;  Location: [facility];  Service: Obstetrics;  Laterality: N/A;   COLPOSCOPY     dilatation and currettage  N/A    DILATION AND CURETTAGE OF UTERUS  2009   W/  SUCTION --  required blood transfusion   DILATION AND EVACUATION N/A 09/14/2015   Procedure: DILATATION AND EVACUATION;  Surgeon: Linda Hedges, DO;  Location: [facility];  Service: Gynecology;  Laterality: N/A;    OB History     Gravida  8   Para  6   Term  6   Preterm  0   AB  2   Living  7      SAB  2   IAB  0   Ectopic  0   Multiple  1   Live Births  5            Home Medications    Prior to Admission medications  Medication Sig Start Date End Date Taking? Authorizing Provider  promethazine (PHENERGAN) 25 MG tablet Take 1 tablet (25 mg total) by mouth every 6 (six) hours as needed for nausea or vomiting. 12/02/21  Yes Volney American, PA-C  sucralfate (CARAFATE) 1 g tablet Take 1 tablet (1 g total) by mouth 3 (three) times daily as needed. May dissolve 1 tablet into a glass of water and drink 12/02/21  Yes Volney American, PA-C  Accu-Chek Softclix Lancets lancets Use as instructed Patient not taking: No sig reported 08/04/20   Shelly Bombard, MD  Blood Glucose Monitoring Suppl (ACCU-CHEK GUIDE) w/Device KIT 1 Device by Does not apply route 4 (four) times daily. Patient not taking: No sig reported 08/04/20   Shelly Bombard, MD  Blood Pressure Monitoring (BLOOD PRESSURE KIT) DEVI 1 kit by Does not apply route once a week. Check Blood Pressure  regularly and record readings into the Bab[employer] App.  Large Cuff.  DX O90.0 Patient not taking: No sig reported 07/06/20   Constant, Peggy, MD  cephALEXin (KEFLEX) 500 MG capsule Take 500 mg by mouth 4 (four) times daily. Start date : 07 /05/22 04/11/21   [provider]  Drospirenone (SLYND) 4 MG TABS Take 1 tablet by mouth daily. Patient not taking: No sig reported 02/27/21   Constant, Peggy, MD  ferrous sulfate 325 (65 FE) MG tablet Take 1 tablet (325 mg total) by mouth every other day. Patient taking differently: Take 325 mg by mouth daily. 01/08/21   Muus, Gwynn Burly, MD  glucose blood (ACCU-CHEK GUIDE) test strip Use to check blood sugars four times a day was instructed Patient not taking: No sig reported 08/04/20   Shelly Bombard, MD  ibuprofen (ADVIL) 200 MG tablet Take 400 mg by mouth every 6 (six) hours as needed.    [provider]  ibuprofen (ADVIL) 800 MG tablet Take 1 tablet (800 mg total) by mouth every 6 (six) hours. Patient not taking: No sig reported 01/07/21   Muus, Gwynn Burly, MD  sertraline (ZOLOFT) 50 MG tablet TAKE 1 TABLET BY MOUTH EVERY DAY Patient not taking: Reported on 04/15/2021 03/02/21   Constant, Peggy, MD    Family History Family History  Problem Relation Age of Onset   Diabetes Mother    Hypertension Mother    Hepatitis Mother    Cancer Mother 31       Breast, cervical   Diabetes Maternal Grandmother    Hypertension Maternal Grandmother    Cancer Maternal Grandmother 50       Breast   Anesthesia problems Neg Hx    Other Neg Hx     Social History Social History   Tobacco Use   Smoking status: Former    Packs/day: 0.00    Types: Cigarettes    Quit date: 11/05/2010    Years since quitting: 11.0   Smokeless tobacco: Never   Tobacco comments:    quit with preg  Vaping Use   Vaping Use: Never used  Substance Use Topics   Alcohol use: No    Comment: occ   Drug use: No    Types: MDMA (Ecstacy)    Comment: prior to this pregnancy in 2009      Allergies   Amoxicillin and Nubain [nalbuphine hcl]   Review of Systems Review of Systems Per HPI  Physical Exam Triage Vital Signs ED Triage Vitals  Enc Vitals Group     BP 12/02/21 0949 122/82  Pulse Rate 12/02/21 0949 79     Resp 12/02/21 0949 18     Temp 12/02/21 0949 98.4 F (36.9 C)     Temp Source 12/02/21 0949 Oral     SpO2 12/02/21 0949 97 %     Weight --      Height --      Head Circumference --      Peak Flow --      Pain Score 12/02/21 0946 5     Pain Loc --      Pain Edu? --      Excl. in [place]? --    No data found.  Updated Vital Signs BP 122/82 (BP Location: Right Arm)    Pulse 79    Temp 98.4 F (36.9 C) (Oral)    Resp 18    SpO2 97%    Breastfeeding No   Visual Acuity Right Eye Distance:   Left Eye Distance:   Bilateral Distance:    Right Eye Near:   Left Eye Near:    Bilateral Near:     Physical Exam Vitals and nursing note reviewed.  Constitutional:      Appearance: Normal appearance. She is not ill-appearing.  HENT:     Head: Atraumatic.     Nose: Nose normal.     Mouth/Throat:     Mouth: Mucous membranes are moist.     Pharynx: Oropharynx is clear.  Eyes:     Extraocular Movements: Extraocular movements intact.     Conjunctiva/sclera: Conjunctivae normal.  Cardiovascular:     Rate and Rhythm: Normal rate and regular rhythm.     Heart sounds: Normal heart sounds.  Pulmonary:     Effort: Pulmonary effort is normal.     Breath sounds: Normal breath sounds.  Abdominal:     General: Bowel sounds are normal. There is no distension.     Palpations: Abdomen is soft.     Tenderness: There is abdominal tenderness. There is no right CVA tenderness, left CVA tenderness or guarding.     Comments: Minimal upper abdominal tenderness to palpation without distention or guarding  Musculoskeletal:        General: Normal range of motion.     Cervical back: Normal range of motion and neck supple.  Skin:    General: Skin is warm and dry.   Neurological:     Mental Status: She is alert and oriented to person, place, and time.  Psychiatric:        Mood and Affect: Mood normal.        Thought Content: Thought content normal.        Judgment: Judgment normal.     UC Treatments / Results  Labs (all labs ordered are listed, but only abnormal results are displayed) Labs Reviewed - No data to display  EKG   Radiology No results found.  Procedures Procedures (including critical care time)  Medications Ordered in UC Medications - No data to display  Initial Impression / Assessment and Plan / UC Course  I have reviewed the triage vital signs and the nursing notes.  Pertinent labs & imaging results that were available during my care of the patient were reviewed by me and considered in my medical decision making (see chart for details).     Vitals and exam overall reassuring with no red flag findings.  Suspect viral GI illness.  Will treat with Phenergan, Carafate, brat diet, fluids.  Work note given.  Return for acutely worsening symptoms  Final Clinical Impressions(s) / UC Diagnoses   Final diagnoses:  Nausea vomiting and diarrhea   Discharge Instructions   None    ED Prescriptions     Medication Sig Dispense Auth. Provider   promethazine (PHENERGAN) 25 MG tablet Take 1 tablet (25 mg total) by mouth every 6 (six) hours as needed for nausea or vomiting. 30 tablet Volney American, [place]   sucralfate (CARAFATE) 1 g tablet Take 1 tablet (1 g total) by mouth 3 (three) times daily as needed. May dissolve 1 tablet into a glass of water and drink 30 tablet Volney American, [place]      PDMP not reviewed this encounter.   Volney American, [place] 12/02/21 1022

## 2021-12-02 NOTE — ED Triage Notes (Signed)
Patient states that Thursday night she started having sharp pains in her stomach and 1 episode of vomiting and a low grade fever  Patient states she has not been able to eat   Patient states that she took Advil dual for her headache

## 2022-01-27 ENCOUNTER — Telehealth: Payer: Medicare Other | Admitting: Nurse Practitioner

## 2022-01-27 DIAGNOSIS — J028 Acute pharyngitis due to other specified organisms: Secondary | ICD-10-CM

## 2022-01-27 DIAGNOSIS — B9689 Other specified bacterial agents as the cause of diseases classified elsewhere: Secondary | ICD-10-CM

## 2022-01-27 MED ORDER — AZITHROMYCIN 250 MG PO TABS: ORAL_TABLET | ORAL | 0 refills | Status: AC

## 2022-01-27 NOTE — Progress Notes (Signed)
I have spent 5 minutes in review of e-visit questionnaire, review and updating patient chart, medical decision making and response to patient.  ° °Thelma Lorenzetti W Nusaybah Ivie, NP ° °  °

## 2022-01-27 NOTE — Progress Notes (Signed)

## 2022-02-25 ENCOUNTER — Telehealth: Payer: Medicare Other | Admitting: Family

## 2022-02-25 DIAGNOSIS — R112 Nausea with vomiting, unspecified: Secondary | ICD-10-CM | POA: Diagnosis not present

## 2022-02-25 MED ORDER — ONDANSETRON HCL 4 MG PO TABS: 4.0000 mg | ORAL_TABLET | Freq: Three times a day (TID) | ORAL | 0 refills | Status: DC | PRN

## 2022-02-25 MED ORDER — SUCRALFATE 1 G PO TABS: 1.0000 g | ORAL_TABLET | Freq: Three times a day (TID) | ORAL | 0 refills | Status: DC | PRN

## 2022-02-25 NOTE — Progress Notes (Signed)
E-Visit for Vomiting  We are sorry that you are not feeling well. Here is how we plan to help!  Based on what you have shared with me it looks like you have a Virus that is irritating your GI tract.  Vomiting is the forceful emptying of a portion of the stomach's content through the mouth.  Although nausea and vomiting can make you feel miserable, it's important to remember that these are not diseases, but rather symptoms of an underlying illness.  When we treat short term symptoms, we always caution that any symptoms that persist should be fully evaluated in a medical office.  I have prescribed a medication that will help alleviate your symptoms and allow you to stay hydrated:  Zofran 4 mg 1 tablet every 8 hours as needed for nausea and vomiting.  If your symptoms continue, you need to be seen face to face to rule out a more serious problem.  HOME CARE: Drink clear liquids.  This is very important! Dehydration (the lack of fluid) can lead to a serious complication.  Start off with 1 tablespoon every 5 minutes for 8 hours. You may begin eating bland foods after 8 hours without vomiting.  Start with saltine crackers, white bread, rice, mashed potatoes, applesauce. After 48 hours on a bland diet, you may resume a normal diet. Try to go to sleep.  Sleep often empties the stomach and relieves the need to vomit.  GET HELP RIGHT AWAY IF:  Your symptoms do not improve or worsen within 2 days after treatment. You have a fever for over 3 days. You cannot keep down fluids after trying the medication.  MAKE SURE YOU:  Understand these instructions. Will watch your condition. Will get help right away if you are not doing well or get worse.   Thank you for choosing an e-visit.  Your e-visit answers were reviewed by a board certified advanced clinical practitioner to complete your personal care plan. Depending upon the condition, your plan could have included both over the counter or prescription  medications.  Please review your pharmacy choice. Make sure the pharmacy is open so you can pick up prescription now. If there is a problem, you may contact your provider through [employer] and have the prescription routed to another pharmacy.  Your safety is important to [place]. If you have drug allergies check your prescription carefully.   For the next 24 hours you can use MyChart to ask questions about today's visit, request a non-urgent call back, or ask for a work or school excuse. You will get an email in the next two days asking about your experience. I hope that your e-visit has been valuable and will speed your recovery.  Approximately 5 minutes was spent documenting and reviewing patient's chart.

## 2022-02-25 NOTE — Addendum Note (Signed)
Addended by: Evelina Dun A on: 02/25/2022 07:29 AM   Modules accepted: Orders

## 2022-02-26 ENCOUNTER — Telehealth: Payer: Medicare Other | Admitting: Nurse Practitioner

## 2022-02-26 DIAGNOSIS — R197 Diarrhea, unspecified: Secondary | ICD-10-CM

## 2022-02-27 NOTE — Progress Notes (Signed)
Tiffany Hall,  After reviewing your chart there are several reasons you could be having diarrhea. It looks like you did have an antibiotic in April, and you can get diarrhea as a result of antibiotic use. For that we recommend at first using a strong Probiotic over the counter like Align brand.   It is also important to stick to a bland diet, avoiding fried greasy or spicy foods. Also a diet low in milk or dairy products.   If the diarrhea persists or if you experience a fever it is recommended you are seen in person to consider stool samples to be sent to the lab.   If the diarrhea resolves within the next 1-3 days you do not need to be seen in person.   We will provide you with a work note as this can be contagious if viral you should not be at work. Be sure to wipe down your bathroom if you share it with others.   For your symptoms you may take Imodium 2 mg tablets that are over the counter at your local pharmacy. Take two tablet now and then one after each loose stool up to 6 a day.  Antibiotics are not needed for most people with diarrhea.   HOME CARE We recommend changing your diet to help with your symptoms for the next few days. Drink plenty of fluids that contain water salt and sugar. Sports drinks such as Gatorade may help.  You may try broths, soups, bananas, applesauce, soft breads, mashed potatoes or crackers.  You are considered infectious for as long as the diarrhea continues. Hand washing or use of alcohol based hand sanitizers is recommend. It is best to stay out of work or school until your symptoms stop.   GET HELP RIGHT AWAY If you have dark yellow colored urine or do not pass urine frequently you should drink more fluids.   If your symptoms worsen  If you feel like you are going to pass out (faint) You have a new problem  MAKE SURE YOU  Understand these instructions. Will watch your condition. Will get help right away if you are not doing well or get worse.  Thank  you for choosing an e-visit.  Your e-visit answers were reviewed by a board certified advanced clinical practitioner to complete your personal care plan. Depending upon the condition, your plan could have included both over the counter or prescription medications.  Please review your pharmacy choice. Make sure the pharmacy is open so you can pick up prescription now. If there is a problem, you may contact your provider through [employer] and have the prescription routed to another pharmacy.  Your safety is important to [place]. If you have drug allergies check your prescription carefully.   For the next 24 hours you can use MyChart to ask questions about today's visit, request a non-urgent call back, or ask for a work or school excuse. You will get an email in the next two days asking about your experience. I hope that your e-visit has been valuable and will speed your recovery.   I spent approximately 5 minutes reviewing the patient's history, current symptoms and coordinating their plan of care today.

## 2022-03-09 ENCOUNTER — Emergency Department (HOSPITAL_COMMUNITY)
Admission: EM | Admit: 2022-03-09 | Discharge: 2022-03-10 | Disposition: A | Discharge status: Home / Self Care | Payer: Medicare Other | Attending: Emergency Medicine | Admitting: Emergency Medicine

## 2022-03-09 ENCOUNTER — Emergency Department (HOSPITAL_COMMUNITY): Payer: Medicare Other

## 2022-03-09 ENCOUNTER — Encounter (HOSPITAL_COMMUNITY): Payer: Self-pay | Admitting: Emergency Medicine

## 2022-03-09 DIAGNOSIS — R202 Paresthesia of skin: Secondary | ICD-10-CM | POA: Insufficient documentation

## 2022-03-09 DIAGNOSIS — R0789 Other chest pain: Secondary | ICD-10-CM | POA: Diagnosis not present

## 2022-03-09 LAB — CBC
HCT: 34.4 % — ABNORMAL LOW (ref 36.0–46.0)
Hemoglobin: 12 g/dL (ref 12.0–15.0)
MCH: 27.1 pg (ref 26.0–34.0)
MCHC: 34.9 g/dL (ref 30.0–36.0)
MCV: 77.8 fL — ABNORMAL LOW (ref 80.0–100.0)
Platelets: 409 10*3/uL — ABNORMAL HIGH (ref 150–400)
RBC: 4.42 MIL/uL (ref 3.87–5.11)
RDW: 13.2 % (ref 11.5–15.5)
WBC: 8.5 10*3/uL (ref 4.0–10.5)
nRBC: 0 % (ref 0.0–0.2)

## 2022-03-09 NOTE — ED Triage Notes (Signed)
Pt c/o numbness and tingling to right side of her body that started about 3pm. Pt also c/o left sided chest pain that started about 1 hr PTA.

## 2022-03-10 DIAGNOSIS — R202 Paresthesia of skin: Secondary | ICD-10-CM | POA: Diagnosis not present

## 2022-03-10 LAB — D-DIMER, QUANTITATIVE: D-Dimer, Quant: <0.27 ug/mL-FEU (ref 0.00–0.50)

## 2022-03-10 LAB — BASIC METABOLIC PANEL
Anion gap: 5 (ref 5–15)
BUN: 11 mg/dL (ref 6–20)
CO2: 24 mmol/L (ref 22–32)
Calcium: 8.7 mg/dL — ABNORMAL LOW (ref 8.9–10.3)
Chloride: 107 mmol/L (ref 98–111)
Creatinine, Ser: 0.56 mg/dL (ref 0.44–1.00)
GFR, Estimated: >60 mL/min (ref >60)
Glucose, Bld: 138 mg/dL — ABNORMAL HIGH (ref 70–99)
Potassium: 3.7 mmol/L (ref 3.5–5.1)
Sodium: 136 mmol/L (ref 135–145)

## 2022-03-10 LAB — TROPONIN I (HIGH SENSITIVITY)
Troponin I (High Sensitivity): 3 ng/L (ref <18)
Troponin I (High Sensitivity): 3 ng/L (ref <18)

## 2022-03-10 NOTE — ED Provider Notes (Signed)
[facility]  Provider Note  CSN: [id number] Arrival date & time: 03/09/22 2255  History Chief Complaint  Patient presents with   Chest Pain    Tiffany Hall is a 36 y.o. female with no significant PMH aside from pregnancy related HTN/DM reports onset of paresthesias to her RLE about 8 hours prior to arrival after she had been taking a nap. Some tingling in R hand as well but that has improved. She reports about an hour prior to arrival she began having some mild chest discomfort but admits she thinks she may have gotten worked up. She denies any history of CAD. No recent travel. No change of pregnancy.    Home Medications Prior to Admission medications   Medication Sig Start Date End Date Taking? Authorizing Provider  Accu-Chek Softclix Lancets lancets Use as instructed Patient not taking: No sig reported 08/04/20   Shelly Bombard, MD  Blood Glucose Monitoring Suppl (ACCU-CHEK GUIDE) w/Device KIT 1 Device by Does not apply route 4 (four) times daily. Patient not taking: No sig reported 08/04/20   Shelly Bombard, MD  Blood Pressure Monitoring (BLOOD PRESSURE KIT) DEVI 1 kit by Does not apply route once a week. Check Blood Pressure regularly and record readings into the [employer] App.  Large Cuff.  DX O90.0 Patient not taking: No sig reported 07/06/20   Constant, Peggy, MD  cephALEXin (KEFLEX) 500 MG capsule Take 500 mg by mouth 4 (four) times daily. Start date : 07 /05/22 04/11/21   [provider]  Drospirenone (SLYND) 4 MG TABS Take 1 tablet by mouth daily. Patient not taking: No sig reported 02/27/21   Constant, Peggy, MD  ferrous sulfate 325 (65 FE) MG tablet Take 1 tablet (325 mg total) by mouth every other day. Patient taking differently: Take 325 mg by mouth daily. 01/08/21   Muus, Gwynn Burly, MD  glucose blood (ACCU-CHEK GUIDE) test strip Use to check blood sugars four times a day was instructed Patient not taking: No sig reported 08/04/20   Shelly Bombard, MD  ibuprofen (ADVIL) 200 MG tablet Take 400 mg by mouth every 6 (six) hours as needed.    [provider]  ibuprofen (ADVIL) 800 MG tablet Take 1 tablet (800 mg total) by mouth every 6 (six) hours. Patient not taking: No sig reported 01/07/21   Muus, Gwynn Burly, MD  ondansetron (ZOFRAN) 4 MG tablet Take 1 tablet (4 mg total) by mouth every 8 (eight) hours as needed for nausea or vomiting. 02/25/22   Sharion Balloon, FNP  promethazine (PHENERGAN) 25 MG tablet Take 1 tablet (25 mg total) by mouth every 6 (six) hours as needed for nausea or vomiting. 12/02/21   Volney American, PA-C  sertraline (ZOLOFT) 50 MG tablet TAKE 1 TABLET BY MOUTH EVERY DAY Patient not taking: Reported on 04/15/2021 03/02/21   Constant, Peggy, MD  sucralfate (CARAFATE) 1 g tablet Take 1 tablet (1 g total) by mouth 3 (three) times daily as needed. May dissolve 1 tablet into a glass of water and drink 02/25/22   Evelina Dun A, FNP     Allergies    Amoxicillin and Nubain [nalbuphine hcl]   Review of Systems   Review of Systems Please see HPI for pertinent positives and negatives  Physical Exam BP 123/82   Pulse 68   Resp 18   Ht _0  (1.6 m)   Wt 89.8 kg   SpO2 98%   BMI 35.07 kg/m  Physical Exam Vitals and nursing note reviewed.  Constitutional:      Appearance: Normal appearance.  HENT:     Head: Normocephalic and atraumatic.     Nose: Nose normal.     Mouth/Throat:     Mouth: Mucous membranes are moist.  Eyes:     Extraocular Movements: Extraocular movements intact.     Conjunctiva/sclera: Conjunctivae normal.  Cardiovascular:     Rate and Rhythm: Normal rate.  Pulmonary:     Effort: Pulmonary effort is normal.     Breath sounds: Normal breath sounds.  Abdominal:     General: Abdomen is flat.     Palpations: Abdomen is soft.     Tenderness: There is no abdominal tenderness.  Musculoskeletal:        General: No swelling. Normal range of motion.     Cervical back: Neck  supple.  Skin:    General: Skin is warm and dry.  Neurological:     General: No focal deficit present.     Mental Status: She is alert and oriented to person, place, and time.     Cranial Nerves: No cranial nerve deficit.     Motor: No weakness.     Gait: Gait normal.     Comments: Subjective change in sensation of entire RLE compared to LLE but no focal deficits  Psychiatric:        Mood and Affect: Mood normal.    ED Results / Procedures / Treatments   EKG EKG Interpretation  Date/Time:  Friday March 09 2022 23:20:05 EDT Ventricular Rate:  59 PR Interval:  201 QRS Duration: 97 QT Interval:  384 QTC Calculation: 381 R Axis:   70 Text Interpretation: Sinus rhythm Normal ECG No significant change since last tracing Confirmed by Calvert Cantor [phone]) on 03/10/2022 1:02:29 AM  Procedures Procedures  Medications Ordered in the ED Medications - No data to display  Initial Impression and Plan  Patient here with paresthesia of RLE and some to R hand. Otherwise her neuro exam is normal. No signs of acute stroke, no edema to suggest vascular issues/DVT. She also had some chest discomfort prior to arrival, but vitals are normal. EKG is normal. I personally viewed the images from radiology studies and agree with radiologist interpretation: CXR is clear. CBC, BMP and trop are also normal. Patient reassured there does not appear to be any life threatening cause of her paresthesias. Will check second trop to complete her chest pain evaluation. Plan outpatient follow up with PCP/Neuro for further evaluation of her paresthesias.   ED Course   Clinical Course as of 03/10/22 0227  Sat Mar 10, 2022  0140 Patient has expressed continued concerns over DVT despite my reassurances that this is unlikely. Will add dimer.  [CS]  0144 Dimer is normal.  [CS]  0225 Repeat Trop is normal. Patient reassured no signs of ACS, PE or DVT. Recommend outpatient PCP and/or Neurology follow up for her  paresthesias.  [CS]    Clinical Course User Index [CS] Truddie Hidden, MD     MDM Rules/Calculators/A&P Medical Decision Making Given presenting complaint, I considered that admission might be necessary. After review of results from ED lab and/or imaging studies, admission to the hospital is not indicated at this time.    Problems Addressed: Atypical chest pain: acute illness or injury Paresthesia: acute illness or injury  Amount and/or Complexity of Data Reviewed Labs: ordered. Decision-making details documented in ED Course. Radiology: ordered and independent interpretation performed. Decision-making  details documented in ED Course. ECG/medicine tests: ordered and independent interpretation performed. Decision-making details documented in ED Course.  Risk Decision regarding hospitalization.    Final Clinical Impression(s) / ED Diagnoses Final diagnoses:  Paresthesia  Atypical chest pain    Rx / DC Orders ED Discharge Orders     None        Truddie Hidden, MD 03/10/22 [phone]

## 2022-03-21 ENCOUNTER — Telehealth: Payer: Medicare Other | Admitting: Physician Assistant

## 2022-03-21 DIAGNOSIS — B9689 Other specified bacterial agents as the cause of diseases classified elsewhere: Secondary | ICD-10-CM

## 2022-03-21 DIAGNOSIS — J019 Acute sinusitis, unspecified: Secondary | ICD-10-CM

## 2022-03-21 MED ORDER — FLUTICASONE PROPIONATE 50 MCG/ACT NA SUSP: 2.0000 | Freq: Every day | NASAL | 0 refills | Status: DC

## 2022-03-21 MED ORDER — DOXYCYCLINE HYCLATE 100 MG PO TABS: 100.0000 mg | ORAL_TABLET | Freq: Two times a day (BID) | ORAL | 0 refills | Status: DC

## 2022-03-21 NOTE — Progress Notes (Signed)
E-Visit for Sinus Problems  We are sorry that you are not feeling well.  Here is how we plan to help!  Based on what you have shared with me it looks like you have sinusitis.  Sinusitis is inflammation and infection in the sinus cavities of the head.  Based on your presentation I believe you most likely have Acute Bacterial Sinusitis.  This is an infection caused by bacteria and is treated with antibiotics. I have prescribed Doxycycline '100mg'$  by mouth twice a day for 10 days. I have also prescribed fluticasone nasal spray for the congestion and drainage. You may use an oral decongestant such as Mucinex D or if you have glaucoma or high blood pressure use plain Mucinex. Saline nasal spray help and can safely be used as often as needed for congestion.  If you develop worsening sinus pain, fever or notice severe headache and vision changes, or if symptoms are not better after completion of antibiotic, please schedule an appointment with a health care provider.    Sinus infections are not as easily transmitted as other respiratory infection, however we still recommend that you avoid close contact with loved ones, especially the very young and elderly.  Remember to wash your hands thoroughly throughout the day as this is the number one way to prevent the spread of infection!  Home Care: Only take medications as instructed by your medical team. Complete the entire course of an antibiotic. Do not take these medications with alcohol. A steam or ultrasonic humidifier can help congestion.  You can place a towel over your head and breathe in the steam from hot water coming from a faucet. Avoid close contacts especially the very young and the elderly. Cover your mouth when you cough or sneeze. Always remember to wash your hands.  Get Help Right Away If: You develop worsening fever or sinus pain. You develop a severe head ache or visual changes. Your symptoms persist after you have completed your treatment  plan.  Make sure you Understand these instructions. Will watch your condition. Will get help right away if you are not doing well or get worse.  Thank you for choosing an e-visit.  Your e-visit answers were reviewed by a board certified advanced clinical practitioner to complete your personal care plan. Depending upon the condition, your plan could have included both over the counter or prescription medications.  Please review your pharmacy choice. Make sure the pharmacy is open so you can pick up prescription now. If there is a problem, you may contact your provider through [employer] and have the prescription routed to another pharmacy.  Your safety is important to [place]. If you have drug allergies check your prescription carefully.   For the next 24 hours you can use MyChart to ask questions about today's visit, request a non-urgent call back, or ask for a work or school excuse. You will get an email in the next two days asking about your experience. I hope that your e-visit has been valuable and will speed your recovery.   I provided 5 minutes of non face-to-face time during this encounter for chart review and documentation.

## 2022-04-23 ENCOUNTER — Other Ambulatory Visit: Payer: Self-pay

## 2022-04-23 ENCOUNTER — Telehealth: Payer: Medicare Other | Admitting: Physician Assistant

## 2022-04-23 ENCOUNTER — Encounter: Payer: Self-pay | Admitting: Nurse Practitioner

## 2022-04-23 ENCOUNTER — Ambulatory Visit (INDEPENDENT_AMBULATORY_CARE_PROVIDER_SITE_OTHER): Payer: Medicare Other

## 2022-04-23 ENCOUNTER — Ambulatory Visit
Admission: EM | Admit: 2022-04-23 | Discharge: 2022-04-23 | Disposition: A | Discharge status: Home / Self Care | Payer: Medicare Other | Attending: Nurse Practitioner | Admitting: Nurse Practitioner

## 2022-04-23 DIAGNOSIS — R002 Palpitations: Secondary | ICD-10-CM

## 2022-04-23 DIAGNOSIS — R0602 Shortness of breath: Secondary | ICD-10-CM

## 2022-04-23 NOTE — ED Provider Notes (Signed)
[facility]    CSN: [id number] Arrival date & time: 04/23/22  1940      History   Chief Complaint Chief Complaint  Patient presents with   URI    Chest flutters nasal congestion - Entered by patient   Palpitations    HPI Tiffany Hall is a 36 y.o. female.   The history is provided by the patient.   Patient presents for complaints of palpitations that started today.  Patient states that she was at rest and symptoms started.  She denies chest pain, shortness of breath, difficulty breathing, or wheezing.  Patient also denies any previous cardiac history.  She states that she does have a history of a heart murmur, but was told that was resolved.  She also has a history of the sickle cell trait.  Review of her chart also indicates history of hypertension with her pregnancy, reflux disease, anxiety and depression.  She states that she currently does take birth control and has been on the medication for most of her life.  She denies any inciting stress or anxiety that may be related to her current symptoms.  Patient completed an E-visit and was told that she needed to be seen in a face-to-face visit.  Past Medical History:  Diagnosis Date   Anemia    Anxiety    Depression    GERD (gastroesophageal reflux disease)    medication not needed   Gestational diabetes    insulin   Heart murmur    History of abnormal cervical Pap smear    2009   History of cardiac murmur as a child    History of chlamydia    2008   History of gallstones    2012   History of gonorrhea    2008   History of ovarian cyst    History of trichomonal vaginitis    2008   HSV (herpes simplex virus) anogenital infection    Hypertension    PIH with pregnancy   Migraine headache    Preeclampsia in postpartum period 01/15/2015   Pregnancy induced hypertension    Sickle cell trait ([place])    Vaginal Pap smear, abnormal    HPV +    Patient Active Problem List   Diagnosis Date Noted   Encounter  for induction of labor 01/05/2021   Cesarean delivery delivered 01/05/2021   History of bilateral tubal ligation 01/05/2021   Unwanted fertility 11/02/2020   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, antepartum, first trimester 09/09/2020   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   Pre-existing diabetes mellitus affecting pregnancy, antepartum 07/15/2020   Twin pregnancy, antepartum condition or complication 50/93/2671   Maternal obesity affecting pregnancy, antepartum 07/13/2020   History of pre-eclampsia in prior pregnancy, currently pregnant 07/13/2020   Supervision of high-risk pregnancy 07/05/2020   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   Migraine 02/25/2018    Past Surgical History:  Procedure Laterality Date   CESAREAN SECTION MULTI-GESTATIONAL N/A 01/05/2021   Procedure: CESAREAN SECTION MULTI-GESTATIONAL;  Surgeon: Mora Bellman, MD;  Location: [facility];  Service: Obstetrics;  Laterality: N/A;   COLPOSCOPY     dilatation and currettage  N/A    DILATION AND CURETTAGE OF UTERUS  2009   W/  SUCTION --  required blood transfusion   DILATION AND EVACUATION N/A 09/14/2015   Procedure: DILATATION AND EVACUATION;  Surgeon: Linda Hedges, DO;  Location: [facility];  Service: Gynecology;  Laterality: N/A;    OB History     Gravida  8   Para  6   Term  6   Preterm  0   AB  2   Living  7      SAB  2   IAB  0   Ectopic  0   Multiple  1   Live Births  5            Home Medications    Prior to Admission medications   Medication Sig Start Date End Date Taking? Authorizing Provider  Accu-Chek Softclix Lancets lancets Use as instructed Patient not taking: No sig reported 08/04/20   Shelly Bombard, MD  Blood Glucose Monitoring Suppl (ACCU-CHEK GUIDE) w/Device KIT 1 Device by Does not apply route 4 (four) times daily. Patient not taking: No sig reported 08/04/20   Shelly Bombard, MD  Blood Pressure Monitoring (BLOOD PRESSURE KIT) DEVI 1 kit by Does not apply route  once a week. Check Blood Pressure regularly and record readings into the [employer] App.  Large Cuff.  DX O90.0 Patient not taking: No sig reported 07/06/20   Constant, Peggy, MD  cephALEXin (KEFLEX) 500 MG capsule Take 500 mg by mouth 4 (four) times daily. Start date : 07 /05/22 04/11/21   [provider]  doxycycline (VIBRA-TABS) 100 MG tablet Take 1 tablet (100 mg total) by mouth 2 (two) times daily. 03/21/22   Mar Daring, PA-C  Drospirenone (SLYND) 4 MG TABS Take 1 tablet by mouth daily. Patient not taking: No sig reported 02/27/21   Constant, Peggy, MD  ferrous sulfate 325 (65 FE) MG tablet Take 1 tablet (325 mg total) by mouth every other day. Patient taking differently: Take 325 mg by mouth daily. 01/08/21   Muus, Gwynn Burly, MD  fluticasone (FLONASE) 50 MCG/ACT nasal spray Place 2 sprays into both nostrils daily. 03/21/22   Mar Daring, PA-C  glucose blood (ACCU-CHEK GUIDE) test strip Use to check blood sugars four times a day was instructed Patient not taking: No sig reported 08/04/20   Shelly Bombard, MD  ibuprofen (ADVIL) 200 MG tablet Take 400 mg by mouth every 6 (six) hours as needed.    [provider]  ibuprofen (ADVIL) 800 MG tablet Take 1 tablet (800 mg total) by mouth every 6 (six) hours. Patient not taking: No sig reported 01/07/21   Muus, Gwynn Burly, MD  ondansetron (ZOFRAN) 4 MG tablet Take 1 tablet (4 mg total) by mouth every 8 (eight) hours as needed for nausea or vomiting. 02/25/22   Sharion Balloon, FNP  promethazine (PHENERGAN) 25 MG tablet Take 1 tablet (25 mg total) by mouth every 6 (six) hours as needed for nausea or vomiting. 12/02/21   Volney American, PA-C  sertraline (ZOLOFT) 50 MG tablet TAKE 1 TABLET BY MOUTH EVERY DAY Patient not taking: Reported on 04/15/2021 03/02/21   Constant, Peggy, MD  sucralfate (CARAFATE) 1 g tablet Take 1 tablet (1 g total) by mouth 3 (three) times daily as needed. May dissolve 1 tablet into a glass of water and  drink 02/25/22   Sharion Balloon, FNP    Family History Family History  Problem Relation Age of Onset   Diabetes Mother    Hypertension Mother    Hepatitis Mother    Cancer Mother 59       Breast, cervical   Diabetes Maternal Grandmother    Hypertension Maternal Grandmother    Cancer Maternal Grandmother 45       Breast   Anesthesia problems  Neg Hx    Other Neg Hx     Social History Social History   Tobacco Use   Smoking status: Former    Packs/day: 0.00    Types: Cigarettes    Quit date: 11/05/2010    Years since quitting: 11.4   Smokeless tobacco: Never   Tobacco comments:    quit with preg  Vaping Use   Vaping Use: Never used  Substance Use Topics   Alcohol use: No    Comment: occ   Drug use: No    Types: MDMA (Ecstacy)    Comment: prior to this pregnancy in 2009     Allergies   Amoxicillin and Nubain [nalbuphine hcl]   Review of Systems Review of Systems Per HPI  Physical Exam Triage Vital Signs ED Triage Vitals [04/23/22 2009]  Enc Vitals Group     BP 118/77     Pulse Rate 69     Resp 20     Temp 98.4 F (36.9 C)     Temp src      SpO2 99 %     Weight      Height      Head Circumference      Peak Flow      Pain Score      Pain Loc      Pain Edu?      Excl. in [place]?    No data found.  Updated Vital Signs BP 118/77   Pulse 69   Temp 98.4 F (36.9 C)   Resp 20   SpO2 99%   Visual Acuity Right Eye Distance:   Left Eye Distance:   Bilateral Distance:    Right Eye Near:   Left Eye Near:    Bilateral Near:     Physical Exam Vitals reviewed.  Constitutional:      General: She is not in acute distress.    Appearance: She is well-developed.  HENT:     Head: Normocephalic.     Nose: Nose normal.     Mouth/Throat:     Mouth: Mucous membranes are moist.  Eyes:     Extraocular Movements: Extraocular movements intact.     Conjunctiva/sclera: Conjunctivae normal.     Pupils: Pupils are equal, round, and reactive to light.   Cardiovascular:     Rate and Rhythm: Normal rate and regular rhythm.     Heart sounds: Normal heart sounds.  Pulmonary:     Effort: Pulmonary effort is normal. No respiratory distress.     Breath sounds: Normal breath sounds. No stridor. No wheezing, rhonchi or rales.  Abdominal:     General: Bowel sounds are normal. There is no distension.     Palpations: Abdomen is soft.     Tenderness: There is no abdominal tenderness. There is no guarding or rebound.  Genitourinary:    Vagina: Normal. No vaginal discharge.  Musculoskeletal:     Cervical back: Normal range of motion.  Skin:    General: Skin is warm and dry.     Findings: No erythema or rash.  Neurological:     General: No focal deficit present.     Mental Status: She is alert and oriented to person, place, and time.     Cranial Nerves: No cranial nerve deficit.  Psychiatric:        Mood and Affect: Mood normal.        Behavior: Behavior normal.      UC Treatments / Results  Labs (all  labs ordered are listed, but only abnormal results are displayed) Labs Reviewed - No data to display  EKG: NSR w/PVC's; rate 74   Radiology DG Chest 2 View  Result Date: 04/23/2022 CLINICAL DATA:  Palpitations EXAM: CHEST - 2 VIEW COMPARISON:  Chest x-ray 03/09/2022 FINDINGS: Heart size and mediastinal contours are within normal limits. No suspicious pulmonary opacities identified. No pleural effusion or pneumothorax visualized. No acute osseous abnormality appreciated. IMPRESSION: No acute intrathoracic process identified. Electronically Signed   By: Ofilia Neas M.D.   On: 04/23/2022 20:36    Procedures Procedures (including critical care time)  Medications Ordered in UC Medications - No data to display  Initial Impression / Assessment and Plan / UC Course  I have reviewed the triage vital signs and the nursing notes.  Pertinent labs & imaging results that were available during my care of the patient were reviewed by me and  considered in my medical decision making (see chart for details).  Patient presents for complaints of palpitations.  Patient states symptoms started today.  Other than the palpitations, she has been asymptomatic as she has had no chest pain, shortness of breath, or difficulty breathing.  She denies any increased anxiety or stress or other inciting event.  She also has no significant past medical history.  Patient is currently on OCP.  Chest x-ray was negative.  Patient was advised that she will need to follow-up with her primary care physician for further evaluation and consider referral to cardiology as a loop monitor may be helpful in determining the etiology of her palpitations.  Strict indications of when to go to the emergency department were provided to the patient.  Patient advised to follow-up as needed.  Did recommend patient starting an 81 mg aspirin daily until she is seen by her PCP. Final Clinical Impressions(s) / UC Diagnoses   Final diagnoses:  Palpitations     Discharge Instructions      Your chest x-ray is normal.  Your EKG did show premature ventricular complexes. Please avoid foods such as caffeine, chocolate, alcohol this may aggravate your symptoms. As discussed, recommend that you start taking an aspirin daily until you are seen by your primary care physician. Go to the emergency department immediately if you develop shortness of breath, difficulty breathing, wheezing, palpitations that will not stop, or other concerns. Please call your primary care physician tomorrow to be seen as soon as possible.  Please discuss a referral to cardiology for your symptoms.     ED Prescriptions   None    PDMP not reviewed this encounter.   Tish Men, NP 04/24/22 [phone]

## 2022-04-23 NOTE — Discharge Instructions (Addendum)
Your chest x-ray is normal.  Your EKG did show premature ventricular complexes. Please avoid foods such as caffeine, chocolate, alcohol this may aggravate your symptoms. As discussed, recommend that you start taking an aspirin daily until you are seen by your primary care physician. Go to the emergency department immediately if you develop shortness of breath, difficulty breathing, wheezing, palpitations that will not stop, or other concerns. Please call your primary care physician tomorrow to be seen as soon as possible.  Please discuss a referral to cardiology for your symptoms.

## 2022-04-23 NOTE — Progress Notes (Signed)
Because of increased palpitations and shortness of breath with current URI symptoms, I feel your condition warrants further evaluation and I recommend that you be seen in a face to face visit so you can get a detailed heart and lung examination and make sure you get proper evaluation and treatments.    NOTE: There will be NO CHARGE for this eVisit   If you are having a true medical emergency please call 911.      For an urgent face to face visit, [place] has seven urgent care centers for your convenience:     [facility] [place] [facility] [place] Get Driving Directions [phone] [address] [place], [place] [address]    [facility]) Get Driving Directions [phone] [place], [place] [address]  [facility] [place] ([place]) Get Driving Directions [phone] [address] [place],  [place]  [address]  [facility] - at [address] Get Driving Directions  [phone] [id number] [place],  [place] [address]   [facility] at [facility] Get Driving Directions [phone] [address] [place], [place], [place] [address]   [facility] Get Driving Directions  [phone] [address] [place], [place] [address]   [facility] [place] Get Driving Directions [phone] [address] [place], [place] [address]  Your MyChart E-visit questionnaire answers were reviewed by a board certified advanced clinical practitioner to complete your personal care plan based on your specific symptoms.  Thank you for using e-Visits.

## 2022-04-23 NOTE — ED Triage Notes (Signed)
Pt presents with c/o heart palpitations that began earlier today, denies chest pain

## 2022-05-13 ENCOUNTER — Telehealth: Payer: Medicare Other | Admitting: Physician Assistant

## 2022-05-13 DIAGNOSIS — J020 Streptococcal pharyngitis: Secondary | ICD-10-CM

## 2022-05-14 MED ORDER — CLINDAMYCIN HCL 300 MG PO CAPS: 300.0000 mg | ORAL_CAPSULE | Freq: Three times a day (TID) | ORAL | 0 refills | Status: DC

## 2022-05-14 NOTE — Progress Notes (Signed)
E-Visit for Sore Throat - Strep Symptoms  We are sorry that you are not feeling well.  Here is how we plan to help!  Based on what you have shared with me it is likely that you have strep pharyngitis.  Strep pharyngitis is inflammation and infection in the back of the throat.  This is an infection cause by bacteria and is treated with antibiotics.  I have prescribed Clindamycin 300 mg three times a day for 10 days. For throat pain, we recommend over the counter oral pain relief medications such as acetaminophen or aspirin, or anti-inflammatory medications such as ibuprofen or naproxen sodium. Topical treatments such as oral throat lozenges or sprays may be used as needed. Strep infections are not as easily transmitted as other respiratory infections, however we still recommend that you avoid close contact with loved ones, especially the very young and elderly.  Remember to wash your hands thoroughly throughout the day as this is the number one way to prevent the spread of infection and wipe down door knobs and counters with disinfectant.   Home Care: Only take medications as instructed by your medical team. Complete the entire course of an antibiotic. Do not take these medications with alcohol. A steam or ultrasonic humidifier can help congestion.  You can place a towel over your head and breathe in the steam from hot water coming from a faucet. Avoid close contacts especially the very young and the elderly. Cover your mouth when you cough or sneeze. Always remember to wash your hands.  Get Help Right Away If: You develop worsening fever or sinus pain. You develop a severe head ache or visual changes. Your symptoms persist after you have completed your treatment plan.  Make sure you Understand these instructions. Will watch your condition. Will get help right away if you are not doing well or get worse.   Thank you for choosing an e-visit.  Your e-visit answers were reviewed by a board  certified advanced clinical practitioner to complete your personal care plan. Depending upon the condition, your plan could have included both over the counter or prescription medications.  Please review your pharmacy choice. Make sure the pharmacy is open so you can pick up prescription now. If there is a problem, you may contact your provider through MyChart messaging and have the prescription routed to another pharmacy.  Your safety is important to us. If you have drug allergies check your prescription carefully.   For the next 24 hours you can use MyChart to ask questions about today's visit, request a non-urgent call back, or ask for a work or school excuse. You will get an email in the next two days asking about your experience. I hope that your e-visit has been valuable and will speed your recovery.  I provided 5 minutes of non face-to-face time during this encounter for chart review and documentation.   

## 2022-05-31 ENCOUNTER — Telehealth: Payer: Medicare Other | Admitting: Physician Assistant

## 2022-05-31 DIAGNOSIS — J069 Acute upper respiratory infection, unspecified: Secondary | ICD-10-CM | POA: Diagnosis not present

## 2022-05-31 MED ORDER — BENZONATATE 100 MG PO CAPS: 100.0000 mg | ORAL_CAPSULE | Freq: Three times a day (TID) | ORAL | 0 refills | Status: DC | PRN

## 2022-05-31 MED ORDER — FLUTICASONE PROPIONATE 50 MCG/ACT NA SUSP: 2.0000 | Freq: Every day | NASAL | 0 refills | Status: DC

## 2022-05-31 NOTE — Progress Notes (Signed)
E-Visit for Upper Respiratory Infection   We are sorry you are not feeling well.  Here is how we plan to help!  Based on what you have shared with me, it looks like you may have a viral upper respiratory infection.  Upper respiratory infections are caused by a large number of viruses; however, rhinovirus is the most common cause. Giving your known COVID exposures, I would highly recommend retesting for COVID at least 36 hours after your last COVID test to make sure you did not have an initial false-negative result. Please let us know when you have done this, especially if the test comes back positive.  Symptoms vary from person to person, with common symptoms including sore throat, cough, fatigue or lack of energy and feeling of general discomfort.  A low-grade fever of up to 100.4 may present, but is often uncommon.  Symptoms vary however, and are closely related to a person's age or underlying illnesses.  The most common symptoms associated with an upper respiratory infection are nasal discharge or congestion, cough, sneezing, headache and pressure in the ears and face.  These symptoms usually persist for about 3 to 10 days, but can last up to 2 weeks.  It is important to know that upper respiratory infections do not cause serious illness or complications in most cases.    Upper respiratory infections can be transmitted from person to person, with the most common method of transmission being a person's hands.  The virus is able to live on the skin and can infect other persons for up to 2 hours after direct contact.  Also, these can be transmitted when someone coughs or sneezes; thus, it is important to cover the mouth to reduce this risk.  To keep the spread of the illness at [place], good hand hygiene is very important.  This is an infection that is most likely caused by a virus. There are no specific treatments other than to help you with the symptoms until the infection runs its course.  We are sorry you  are not feeling well.  Here is how we plan to help!   For nasal congestion, you may use an oral decongestants such as Mucinex D or if you have glaucoma or high blood pressure use plain Mucinex.  Saline nasal spray or nasal drops can help and can safely be used as often as needed for congestion.  For your congestion, I have prescribed Fluticasone nasal spray one spray in each nostril twice a day  If you do not have a history of heart disease, hypertension, diabetes or thyroid disease, prostate/bladder issues or glaucoma, you may also use Sudafed to treat nasal congestion.  It is highly recommended that you consult with a pharmacist or your primary care physician to ensure this medication is safe for you to take.     If you have a cough, you may use cough suppressants such as Delsym and Robitussin.  If you have glaucoma or high blood pressure, you can also use Coricidin HBP.   For cough I have prescribed for you A prescription cough medication called Tessalon Perles 100 mg. You may take 1-2 capsules every 8 hours as needed for cough  If you have a sore or scratchy throat, use a saltwater gargle-  to  teaspoon of salt dissolved in a 4-ounce to 8-ounce glass of warm water.  Gargle the solution for approximately 15-30 seconds and then spit.  It is important not to swallow the solution.  You can also  use throat lozenges/cough drops and Chloraseptic spray to help with throat pain or discomfort.  Warm or cold liquids can also be helpful in relieving throat pain.  For headache, pain or general discomfort, you can use Ibuprofen or Tylenol as directed.   Some authorities believe that zinc sprays or the use of Echinacea may shorten the course of your symptoms.   HOME CARE Only take medications as instructed by your medical team. Be sure to drink plenty of fluids. Water is fine as well as fruit juices, sodas and electrolyte beverages. You may want to stay away from caffeine or alcohol. If you are nauseated,  try taking small sips of liquids. How do you know if you are getting enough fluid? Your urine should be a pale yellow or almost colorless. Get rest. Taking a steamy shower or using a humidifier may help nasal congestion and ease sore throat pain. You can place a towel over your head and breathe in the steam from hot water coming from a faucet. Using a saline nasal spray works much the same way. Cough drops, hard candies and sore throat lozenges may ease your cough. Avoid close contacts especially the very young and the elderly Cover your mouth if you cough or sneeze Always remember to wash your hands.   GET HELP RIGHT AWAY IF: You develop worsening fever. If your symptoms do not improve within 10 days You develop yellow or green discharge from your nose over 3 days. You have coughing fits You develop a severe head ache or visual changes. You develop shortness of breath, difficulty breathing or start having chest pain Your symptoms persist after you have completed your treatment plan  MAKE SURE YOU  Understand these instructions. Will watch your condition. Will get help right away if you are not doing well or get worse.  Thank you for choosing an e-visit.  Your e-visit answers were reviewed by a board certified advanced clinical practitioner to complete your personal care plan. Depending upon the condition, your plan could have included both over the counter or prescription medications.  Please review your pharmacy choice. Make sure the pharmacy is open so you can pick up prescription now. If there is a problem, you may contact your provider through [employer] and have the prescription routed to another pharmacy.  Your safety is important to [place]. If you have drug allergies check your prescription carefully.   For the next 24 hours you can use MyChart to ask questions about today's visit, request a non-urgent call back, or ask for a work or school excuse. You will get an email in  the next two days asking about your experience. I hope that your e-visit has been valuable and will speed your recovery.

## 2022-05-31 NOTE — Progress Notes (Signed)
I have spent 5 minutes in review of e-visit questionnaire, review and updating patient chart, medical decision making and response to patient.   Connelly [place] Cody Veleria Barnhardt, PA-C    

## 2022-06-27 ENCOUNTER — Telehealth: Payer: Medicare Other | Admitting: Physician Assistant

## 2022-06-27 DIAGNOSIS — J020 Streptococcal pharyngitis: Secondary | ICD-10-CM

## 2022-06-27 MED ORDER — AZITHROMYCIN 250 MG PO TABS: ORAL_TABLET | ORAL | 0 refills | Status: AC

## 2022-06-27 MED ORDER — ALBUTEROL SULFATE HFA 108 (90 BASE) MCG/ACT IN AERS: 1.0000 | INHALATION_SPRAY | Freq: Four times a day (QID) | RESPIRATORY_TRACT | 0 refills | Status: DC | PRN

## 2022-06-27 NOTE — Progress Notes (Signed)
E-Visit for Sore Throat - Strep Symptoms  We are sorry that you are not feeling well.  Here is how we plan to help!  Based on what you have shared with me it is likely that you have strep pharyngitis.  Strep pharyngitis is inflammation and infection in the back of the throat.  This is an infection cause by bacteria and is treated with antibiotics.  I have prescribed Azithromycin 250 mg two tablets today and then one daily for 4 additional days. For throat pain, we recommend over the counter oral pain relief medications such as acetaminophen or aspirin, or anti-inflammatory medications such as ibuprofen or naproxen sodium. Topical treatments such as oral throat lozenges or sprays may be used as needed. Strep infections are not as easily transmitted as other respiratory infections, however we still recommend that you avoid close contact with loved ones, especially the very young and elderly.  Remember to wash your hands thoroughly throughout the day as this is the number one way to prevent the spread of infection and wipe down door knobs and counters with disinfectant.   Home Care: Only take medications as instructed by your medical team. Complete the entire course of an antibiotic. Do not take these medications with alcohol. A steam or ultrasonic humidifier can help congestion.  You can place a towel over your head and breathe in the steam from hot water coming from a faucet. Avoid close contacts especially the very young and the elderly. Cover your mouth when you cough or sneeze. Always remember to wash your hands.  Get Help Right Away If: You develop worsening fever or sinus pain. You develop a severe head ache or visual changes. Your symptoms persist after you have completed your treatment plan.  Make sure you Understand these instructions. Will watch your condition. Will get help right away if you are not doing well or get worse.   Thank you for choosing an e-visit.  Your e-visit  answers were reviewed by a board certified advanced clinical practitioner to complete your personal care plan. Depending upon the condition, your plan could have included both over the counter or prescription medications.  Please review your pharmacy choice. Make sure the pharmacy is open so you can pick up prescription now. If there is a problem, you may contact your provider through [employer] and have the prescription routed to another pharmacy.  Your safety is important to [place]. If you have drug allergies check your prescription carefully.   For the next 24 hours you can use MyChart to ask questions about today's visit, request a non-urgent call back, or ask for a work or school excuse. You will get an email in the next two days asking about your experience. I hope that your e-visit has been valuable and will speed your recovery.  I provided 5 minutes of non face-to-face time during this encounter for chart review and documentation.

## 2022-06-27 NOTE — Addendum Note (Signed)
Addended by: Mar Daring on: 06/27/2022 07:46 AM   Modules accepted: Orders

## 2022-07-02 ENCOUNTER — Telehealth: Payer: Medicare Other | Admitting: Physician Assistant

## 2022-07-02 DIAGNOSIS — B379 Candidiasis, unspecified: Secondary | ICD-10-CM

## 2022-07-02 MED ORDER — FLUCONAZOLE 150 MG PO TABS: 150.0000 mg | ORAL_TABLET | ORAL | 0 refills | Status: DC | PRN

## 2022-07-02 NOTE — Progress Notes (Signed)
E-Visit for Vaginal Symptoms ? ?We are sorry that you are not feeling well. Here is how we plan to help! ?Based on what you shared with me it looks like you: May have a yeast vaginosis ? ?Vaginosis is an inflammation of the vagina that can result in discharge, itching and pain. The cause is usually a change in the normal balance of vaginal bacteria or an infection. Vaginosis can also result from reduced estrogen levels after menopause. ? ?The most common causes of vaginosis are: ? ? Bacterial vaginosis which results from an overgrowth of one on several organisms that are normally present in your vagina. ? ? Yeast infections which are caused by a naturally occurring fungus called candida. ? ? Vaginal atrophy (atrophic vaginosis) which results from the thinning of the vagina from reduced estrogen levels after menopause. ? ? Trichomoniasis which is caused by a parasite and is commonly transmitted by sexual intercourse. ? ?Factors that increase your risk of developing vaginosis include: ?Medications, such as antibiotics and steroids ?Uncontrolled diabetes ?Use of hygiene products such as bubble bath, vaginal spray or vaginal deodorant ?Douching ?Wearing damp or tight-fitting clothing ?Using an intrauterine device (IUD) for birth control ?Hormonal changes, such as those associated with pregnancy, birth control pills or menopause ?Sexual activity ?Having a sexually transmitted infection ? ?Your treatment plan is A single Diflucan (fluconazole) 150mg tablet once.  I have electronically sent this prescription into the pharmacy that you have chosen. ? ?Be sure to take all of the medication as directed. Stop taking any medication if you develop a rash, tongue swelling or shortness of breath. Mothers who are breast feeding should consider pumping and discarding their breast milk while on these antibiotics. However, there is no consensus that infant exposure at these doses would be harmful.  ?Remember that medication creams can  weaken latex condoms. ?. ? ? ?HOME CARE: ? ?Good hygiene may prevent some types of vaginosis from recurring and may relieve some symptoms: ? ?Avoid baths, hot tubs and whirlpool spas. Rinse soap from your outer genital area after a shower, and dry the area well to prevent irritation. Don't use scented or harsh soaps, such as those with deodorant or antibacterial action. ?Avoid irritants. These include scented tampons and pads. ?Wipe from front to back after using the toilet. Doing so avoids spreading fecal bacteria to your vagina. ? ?Other things that may help prevent vaginosis include: ? ?Don't douche. Your vagina doesn't require cleansing other than normal bathing. Repetitive douching disrupts the normal organisms that reside in the vagina and can actually increase your risk of vaginal infection. Douching won't clear up a vaginal infection. ?Use a latex condom. Both female and female latex condoms may help you avoid infections spread by sexual contact. ?Wear cotton underwear. Also wear pantyhose with a cotton crotch. If you feel comfortable without it, skip wearing underwear to bed. Yeast thrives in moist environments ?Your symptoms should improve in the next day or two. ? ?GET HELP RIGHT AWAY IF: ? ?You have pain in your lower abdomen ( pelvic area or over your ovaries) ?You develop nausea or vomiting ?You develop a fever ?Your discharge changes or worsens ?You have persistent pain with intercourse ?You develop shortness of breath, a rapid pulse, or you faint. ? ?These symptoms could be signs of problems or infections that need to be evaluated by a medical provider now. ? ?MAKE SURE YOU  ? ?Understand these instructions. ?Will watch your condition. ?Will get help right away if you are not   doing well or get worse. ? ?Thank you for choosing an e-visit. ? ?Your e-visit answers were reviewed by a board certified advanced clinical practitioner to complete your personal care plan. Depending upon the condition, your plan  could have included both over the counter or prescription medications. ? ?Please review your pharmacy choice. Make sure the pharmacy is open so you can pick up prescription now. If there is a problem, you may contact your provider through MyChart messaging and have the prescription routed to another pharmacy.  Your safety is important to us. If you have drug allergies check your prescription carefully.  ? ?For the next 24 hours you can use MyChart to ask questions about today's visit, request a non-urgent call back, or ask for a work or school excuse. ?You will get an email in the next two days asking about your experience. I hope that your e-visit has been valuable and will speed your recovery. ? ? ?I provided 5 minutes of non face-to-face time during this encounter for chart review and documentation.  ? ?

## 2022-07-04 ENCOUNTER — Telehealth: Payer: Medicare Other | Admitting: Physician Assistant

## 2022-07-04 DIAGNOSIS — J069 Acute upper respiratory infection, unspecified: Secondary | ICD-10-CM

## 2022-07-04 MED ORDER — BENZONATATE 100 MG PO CAPS: 100.0000 mg | ORAL_CAPSULE | Freq: Three times a day (TID) | ORAL | 0 refills | Status: DC | PRN

## 2022-07-04 NOTE — Progress Notes (Signed)
I have spent 5 minutes in review of e-visit questionnaire, review and updating patient chart, medical decision making and response to patient.   Nameer Summer [place], PA-C    

## 2022-07-04 NOTE — Progress Notes (Signed)

## 2022-07-06 ENCOUNTER — Telehealth: Payer: Medicare Other | Admitting: Physician Assistant

## 2022-07-06 DIAGNOSIS — J069 Acute upper respiratory infection, unspecified: Secondary | ICD-10-CM

## 2022-07-06 NOTE — Progress Notes (Signed)
Because you have had an antibiotic on 06/27/22 and have not had any improvements, I feel your condition warrants further evaluation and I recommend that you be seen in a face to face visit.   NOTE: There will be NO CHARGE for this eVisit   If you are having a true medical emergency please call 911.      For an urgent face to face visit, [facility] has seven urgent care centers for your convenience:     [facility] [place] [facility] [place] Get Driving Directions [phone] [place], [place] [address]    [facility]) Get Driving Directions [phone] [place], [place] [address]  [facility] [place] ([place]) Get Driving Directions [phone] [address] [place],  [place]  [address]  [facility] - at [address] Get Driving Directions  [phone] [place],  [place] [address]   [facility] Get Driving Directions [phone] [address] [place], [place], [place] [address]   [facility] Get Driving Directions  [phone] [address] [place], [place] [address]   [facility] [place] Get Driving Directions [phone] [address], [place], [place] [address]  Your MyChart E-visit questionnaire answers were reviewed by a board certified advanced clinical practitioner to complete your personal care plan based on your specific symptoms.  Thank you for using e-Visits.   I provided 5 minutes of non face-to-face time during this encounter for chart review and documentation.

## 2022-07-24 ENCOUNTER — Telehealth: Payer: Medicare Other | Admitting: Family Medicine

## 2022-07-24 DIAGNOSIS — J020 Streptococcal pharyngitis: Secondary | ICD-10-CM | POA: Diagnosis not present

## 2022-07-24 MED ORDER — AZITHROMYCIN 250 MG PO TABS: ORAL_TABLET | ORAL | 0 refills | Status: AC

## 2022-07-24 NOTE — Progress Notes (Signed)
E-Visit for Sore Throat - Strep Symptoms  We are sorry that you are not feeling well.  Here is how we plan to help!  Based on what you have shared with me it is likely that you have strep pharyngitis.  Strep pharyngitis is inflammation and infection in the back of the throat.  This is an infection cause by bacteria and is treated with antibiotics.  I have prescribed Azithromycin 250 mg two tablets today and then one daily for 4 additional days. For throat pain, we recommend over the counter oral pain relief medications such as acetaminophen or aspirin, or anti-inflammatory medications such as ibuprofen or naproxen sodium. Topical treatments such as oral throat lozenges or sprays may be used as needed. Strep infections are not as easily transmitted as other respiratory infections, however we still recommend that you avoid close contact with loved ones, especially the very young and elderly.  Remember to wash your hands thoroughly throughout the day as this is the number one way to prevent the spread of infection and wipe down door knobs and counters with disinfectant.   Home Care: Only take medications as instructed by your medical team. Complete the entire course of an antibiotic. Do not take these medications with alcohol. A steam or ultrasonic humidifier can help congestion.  You can place a towel over your head and breathe in the steam from hot water coming from a faucet. Avoid close contacts especially the very young and the elderly. Cover your mouth when you cough or sneeze. Always remember to wash your hands.  Get Help Right Away If: You develop worsening fever or sinus pain. You develop a severe head ache or visual changes. Your symptoms persist after you have completed your treatment plan.  Make sure you Understand these instructions. Will watch your condition. Will get help right away if you are not doing well or get worse.   Thank you for choosing an e-visit.  Your e-visit  answers were reviewed by a board certified advanced clinical practitioner to complete your personal care plan. Depending upon the condition, your plan could have included both over the counter or prescription medications.  Please review your pharmacy choice. Make sure the pharmacy is open so you can pick up prescription now. If there is a problem, you may contact your provider through MyChart messaging and have the prescription routed to another pharmacy.  Your safety is important to us. If you have drug allergies check your prescription carefully.   For the next 24 hours you can use MyChart to ask questions about today's visit, request a non-urgent call back, or ask for a work or school excuse. You will get an email in the next two days asking about your experience. I hope that your e-visit has been valuable and will speed your recovery.  I provided 5 minutes of non face-to-face time during this encounter for chart review, medication and order placement, as well as and documentation.    

## 2022-08-24 ENCOUNTER — Telehealth: Payer: Medicare Other | Admitting: Physician Assistant

## 2022-08-24 DIAGNOSIS — M79604 Pain in right leg: Secondary | ICD-10-CM

## 2022-08-24 DIAGNOSIS — M79605 Pain in left leg: Secondary | ICD-10-CM | POA: Diagnosis not present

## 2022-08-24 MED ORDER — METHYLPREDNISOLONE 4 MG PO TBPK: ORAL_TABLET | ORAL | 0 refills | Status: DC

## 2022-08-24 MED ORDER — LIDOCAINE 4 % EX CREA: 1.0000 | TOPICAL_CREAM | CUTANEOUS | 0 refills | Status: DC | PRN

## 2022-08-24 NOTE — Patient Instructions (Signed)
[name], thank you for joining Mar Daring, PA-C for today's virtual visit.  While this provider is not your primary care provider (PCP), if your PCP is located in our provider database this encounter information will be shared with them immediately following your visit.   [facility] account gives you access to today's visit and all your visits, tests, and labs performed at [facility] " click here if you don't have a [facility] account or go to [email or web address]  Consent: (Patient) Tiffany Hall provided verbal consent for this virtual visit at the beginning of the encounter.  Current Medications:  Current Outpatient Medications:    lidocaine (LMX) 4 % cream, Apply 1 Application topically as needed., Disp: 30 g, Rfl: 0   methylPREDNISolone (MEDROL DOSEPAK) 4 MG TBPK tablet, 6 day taper; take as directed on package instructions, Disp: 21 tablet, Rfl: 0   Accu-Chek Softclix Lancets lancets, Use as instructed (Patient not taking: No sig reported), Disp: 100 each, Rfl: 12   albuterol (VENTOLIN HFA) 108 (90 Base) MCG/ACT inhaler, Inhale 1-2 puffs into the lungs every 6 (six) hours as needed for wheezing or shortness of breath., Disp: 8 g, Rfl: 0   Blood Glucose Monitoring Suppl (ACCU-CHEK GUIDE) w/Device KIT, 1 Device by Does not apply route 4 (four) times daily. (Patient not taking: No sig reported), Disp: 1 kit, Rfl: 0   Blood Pressure Monitoring (BLOOD PRESSURE KIT) DEVI, 1 kit by Does not apply route once a week. Check Blood Pressure regularly and record readings into the [employer] App.  Large Cuff.  DX O90.0 (Patient not taking: No sig reported), Disp: 1 each, Rfl: 0   ferrous sulfate 325 (65 FE) MG tablet, Take 1 tablet (325 mg total) by mouth every other day. (Patient taking differently: Take 325 mg by mouth daily.), Disp: 45 tablet, Rfl: 3   fluticasone (FLONASE) 50 MCG/ACT nasal spray, Place 2 sprays into both nostrils daily.,  Disp: 16 g, Rfl: 0   glucose blood (ACCU-CHEK GUIDE) test strip, Use to check blood sugars four times a day was instructed (Patient not taking: No sig reported), Disp: 50 each, Rfl: 12   ibuprofen (ADVIL) 800 MG tablet, Take 1 tablet (800 mg total) by mouth every 6 (six) hours. (Patient not taking: No sig reported), Disp: 30 tablet, Rfl: 0   Medications ordered in this encounter:  Meds ordered this encounter  Medications   methylPREDNISolone (MEDROL DOSEPAK) 4 MG TBPK tablet    Sig: 6 day taper; take as directed on package instructions    Dispense:  21 tablet    Refill:  0    Order Specific Question:   Supervising Provider    Answer:   Chase Picket [[id number]]   lidocaine (LMX) 4 % cream    Sig: Apply 1 Application topically as needed.    Dispense:  30 g    Refill:  0    Order Specific Question:   Supervising Provider    Answer:   Chase Picket [id number]     *If you need refills on other medications prior to your next appointment, please contact your pharmacy*  Follow-Up: Call back or seek an in-person evaluation if the symptoms worsen or if the condition fails to improve as anticipated.  [facility] [phone]   If you have been instructed to have an in-person evaluation today at a local Urgent Care facility, please use the link below. It will take you to a  list of all of our available [facility], including address, phone number and hours of operation. Please do not delay care.  [facility]  If you or a family member do not have a primary care provider, use the link below to schedule a visit and establish care. When you choose a [facility] primary care physician or advanced practice provider, you gain a long-term partner in health. Find a Primary Care Provider  Learn more about [facility]'s in-office and virtual care options: [facility]

## 2022-08-24 NOTE — Progress Notes (Signed)
Virtual Visit Consent   Tiffany Hall, you are scheduled for a virtual visit with a [place] provider today. Just as with appointments in the office, your consent must be obtained to participate. Your consent will be active for this visit and any virtual visit you may have with one of our providers in the next 365 days. If you have a MyChart account, a copy of this consent can be sent to you electronically.  As this is a virtual visit, video technology does not allow for your provider to perform a traditional examination. This may limit your provider's ability to fully assess your condition. If your provider identifies any concerns that need to be evaluated in person or the need to arrange testing (such as labs, EKG, etc.), we will make arrangements to do so. Although advances in technology are sophisticated, we cannot ensure that it will always work on either your end or our end. If the connection with a video visit is poor, the visit may have to be switched to a telephone visit. With either a video or telephone visit, we are not always able to ensure that we have a secure connection.  By engaging in this virtual visit, you consent to the provision of healthcare and authorize for your insurance to be billed (if applicable) for the services provided during this visit. Depending on your insurance coverage, you may receive a charge related to this service.  I need to obtain your verbal consent now. Are you willing to proceed with your visit today? Tiffany Hall has provided verbal consent on 08/24/2022 for a virtual visit (video or telephone). Mar Daring, PA-C  Date: 08/24/2022 3:40 PM  Virtual Visit via Video Note   I, Mar Daring, connected with  Tiffany Hall  ([id number], [birth year]) on 08/24/22 at  3:30 PM EST by a video-enabled telemedicine application and verified that I am speaking with the correct person using two identifiers.  Location: Patient: Virtual Visit  Location Patient: Mobile Provider: Virtual Visit Location Provider: Home Office   I discussed the limitations of evaluation and management by telemedicine and the availability of in person appointments. The patient expressed understanding and agreed to proceed.    History of Present Illness: Tiffany Hall is a 36 y.o. who identifies as a female who was assigned female at birth, and is being seen today for chronic leg soreness.  HPI: Leg Pain  The incident occurred more than 1 week ago (started over a year ago following birth of her twins). There was no injury mechanism (sits a lot with her job and has pain in posterior thighs bilaterally). Pain location: posterior thighs bilaterally. The quality of the pain is described as aching. The pain is moderate. The pain has been Constant since onset. Associated symptoms include numbness and tingling. Pertinent negatives include no inability to bear weight, loss of sensation or muscle weakness. Associated symptoms comments: Reports when she goes from sitting to standing she will have weakness in her legs and takes walking a few steps to get straightened up and feel normal. She reports no foreign bodies present. The symptoms are aggravated by movement, weight bearing and palpation. Treatments tried: meloxicam, advil dual, heat packs. The treatment provided no relief.      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      Medications:  Current Outpatient Medications:    lidocaine (LMX) 4 % cream, Apply 1 Application topically as needed., Disp: 30 g, Rfl: 0   methylPREDNISolone (MEDROL DOSEPAK) 4 MG TBPK tablet, 6 day taper; take as directed on package instructions, Disp: 21 tablet, Rfl: 0   Accu-Chek Softclix Lancets lancets, Use as instructed (Patient not taking: No sig reported), Disp: 100 each, Rfl: 12   albuterol (VENTOLIN HFA) 108 (90 Base) MCG/ACT inhaler, Inhale 1-2 puffs into the lungs every 6 (six) hours as needed for wheezing or shortness of breath., Disp: 8 g, Rfl: 0   Blood Glucose Monitoring Suppl (ACCU-CHEK GUIDE) w/Device KIT, 1 Device by Does not apply route 4 (four) times daily. (Patient not taking: No sig reported), Disp: 1 kit, Rfl: 0   Blood Pressure Monitoring (BLOOD PRESSURE KIT) DEVI, 1 kit by Does not apply route once a week. Check Blood Pressure regularly and record readings into the [employer] App.  Large Cuff.  DX O90.0 (Patient not taking: No sig reported), Disp: 1 each, Rfl: 0   ferrous sulfate 325 (65 FE) MG tablet, Take 1 tablet (325 mg total) by mouth every other day. (Patient taking differently: Take 325 mg by mouth daily.), Disp: 45 tablet, Rfl: 3   fluticasone (FLONASE) 50 MCG/ACT nasal spray, Place 2 sprays into both nostrils daily., Disp: 16 g, Rfl: 0   glucose blood (ACCU-CHEK GUIDE) test strip, Use to check blood sugars four times a day was instructed (Patient not taking: No sig reported), Disp: 50 each, Rfl: 12   ibuprofen (ADVIL) 800 MG tablet, Take 1 tablet (800 mg total) by mouth every 6 (six) hours. (Patient not taking: No sig  reported), Disp: 30 tablet, Rfl: 0   Observations/Objective: Patient is well-developed, well-nourished in no acute distress.  Resting comfortably  Head is normocephalic, atraumatic.  No labored breathing.  Speech is clear and coherent with logical content.  Patient is alert and oriented at baseline.    Assessment and Plan: 1. Pain in both lower extremities - methylPREDNISolone (MEDROL DOSEPAK) 4 MG TBPK tablet; 6 day taper; take as directed on package instructions  Dispense: 21 tablet; Refill: 0 - lidocaine (LMX) 4 % cream; Apply 1 Application topically as needed.  Dispense: 30 g; Refill: 0  - Chronic issue worsened from sitting - Discussed will try medrol since has failed OTC NSAIDs and meloxicam - Lidocaine cream to apply topically for pain - Heating pad - Ergonomic cushion for seat - Ergonomic sit stand desk to change position should be considered - Epsom salt soaks - Follow up with PCP  Follow Up Instructions: I discussed the assessment and treatment plan with the patient. The patient was provided an opportunity to ask questions and all were answered. The patient agreed with the plan and demonstrated an understanding of the instructions.  A copy of instructions were sent  to the patient via MyChart unless otherwise noted below.    The patient was advised to call back or seek an in-person evaluation if the symptoms worsen or if the condition fails to improve as anticipated.  Time:  I spent 12 minutes with the patient via telehealth technology discussing the above problems/concerns.    Mar Daring, PA-C

## 2022-09-04 ENCOUNTER — Telehealth: Payer: Medicare Other | Admitting: Nurse Practitioner

## 2022-09-04 DIAGNOSIS — J069 Acute upper respiratory infection, unspecified: Secondary | ICD-10-CM | POA: Diagnosis not present

## 2022-09-04 MED ORDER — BENZONATATE 100 MG PO CAPS: 100.0000 mg | ORAL_CAPSULE | Freq: Three times a day (TID) | ORAL | 0 refills | Status: DC | PRN

## 2022-09-04 MED ORDER — FLUTICASONE PROPIONATE 50 MCG/ACT NA SUSP: 2.0000 | Freq: Every day | NASAL | 6 refills | Status: DC

## 2022-09-04 NOTE — Progress Notes (Signed)
E-Visit for Upper Respiratory Infection   We are sorry you are not feeling well.  Here is how we plan to help!  Based on what you have shared with me, it looks like you may have a viral upper respiratory infection.  Upper respiratory infections are caused by a large number of viruses; however, rhinovirus is the most common cause. If you have been around someone known to have COVID you should test again tomorrow before going back to work. Our recommendation is to be out of work until fever free for 24 hours. If you test positive for COVID tomorrow please schedule a follow up.   Symptoms vary from person to person, with common symptoms including sore throat, cough, fatigue or lack of energy and feeling of general discomfort.  A low-grade fever of up to 100.4 may present, but is often uncommon.  Symptoms vary however, and are closely related to a person's age or underlying illnesses.  The most common symptoms associated with an upper respiratory infection are nasal discharge or congestion, cough, sneezing, headache and pressure in the ears and face.  These symptoms usually persist for about 3 to 10 days, but can last up to 2 weeks.  It is important to know that upper respiratory infections do not cause serious illness or complications in most cases.    Upper respiratory infections can be transmitted from person to person, with the most common method of transmission being a person's hands.  The virus is able to live on the skin and can infect other persons for up to 2 hours after direct contact.  Also, these can be transmitted when someone coughs or sneezes; thus, it is important to cover the mouth to reduce this risk.  To keep the spread of the illness at [place], good hand hygiene is very important.  This is an infection that is most likely caused by a virus. There are no specific treatments other than to help you with the symptoms until the infection runs its course.  We are sorry you are not feeling well.   Here is how we plan to help!   For nasal congestion, you may use an oral decongestants such as Mucinex D or if you have glaucoma or high blood pressure use plain Mucinex.  Saline nasal spray or nasal drops can help and can safely be used as often as needed for congestion.  For your congestion, I have prescribed Fluticasone nasal spray one spray in each nostril twice a day  If you do not have a history of heart disease, hypertension, diabetes or thyroid disease, prostate/bladder issues or glaucoma, you may also use Sudafed to treat nasal congestion.  It is highly recommended that you consult with a pharmacist or your primary care physician to ensure this medication is safe for you to take.     If you have a cough, you may use cough suppressants such as Delsym and Robitussin.  If you have glaucoma or high blood pressure, you can also use Coricidin HBP.   For cough I have prescribed for you A prescription cough medication called Tessalon Perles 100 mg. You may take 1-2 capsules every 8 hours as needed for cough  If you have a sore or scratchy throat, use a saltwater gargle-  to  teaspoon of salt dissolved in a 4-ounce to 8-ounce glass of warm water.  Gargle the solution for approximately 15-30 seconds and then spit.  It is important not to swallow the solution.  You can also use  throat lozenges/cough drops and Chloraseptic spray to help with throat pain or discomfort.  Warm or cold liquids can also be helpful in relieving throat pain.  For headache, pain or general discomfort, you can use Ibuprofen or Tylenol as directed.   Some authorities believe that zinc sprays or the use of Echinacea may shorten the course of your symptoms.   HOME CARE Only take medications as instructed by your medical team. Be sure to drink plenty of fluids. Water is fine as well as fruit juices, sodas and electrolyte beverages. You may want to stay away from caffeine or alcohol. If you are nauseated, try taking small sips of  liquids. How do you know if you are getting enough fluid? Your urine should be a pale yellow or almost colorless. Get rest. Taking a steamy shower or using a humidifier may help nasal congestion and ease sore throat pain. You can place a towel over your head and breathe in the steam from hot water coming from a faucet. Using a saline nasal spray works much the same way. Cough drops, hard candies and sore throat lozenges may ease your cough. Avoid close contacts especially the very young and the elderly Cover your mouth if you cough or sneeze Always remember to wash your hands.   GET HELP RIGHT AWAY IF: You develop worsening fever. If your symptoms do not improve within 10 days You develop yellow or green discharge from your nose over 3 days. You have coughing fits You develop a severe head ache or visual changes. You develop shortness of breath, difficulty breathing or start having chest pain Your symptoms persist after you have completed your treatment plan  MAKE SURE YOU  Understand these instructions. Will watch your condition. Will get help right away if you are not doing well or get worse.  Thank you for choosing an e-visit.  Your e-visit answers were reviewed by a board certified advanced clinical practitioner to complete your personal care plan. Depending upon the condition, your plan could have included both over the counter or prescription medications.  Please review your pharmacy choice. Make sure the pharmacy is open so you can pick up prescription now. If there is a problem, you may contact your provider through [employer] and have the prescription routed to another pharmacy.  Your safety is important to [place]. If you have drug allergies check your prescription carefully.   For the next 24 hours you can use MyChart to ask questions about today's visit, request a non-urgent call back, or ask for a work or school excuse. You will get an email in the next two days asking  about your experience. I hope that your e-visit has been valuable and will speed your recovery.  Meds ordered this encounter  Medications   fluticasone (FLONASE) 50 MCG/ACT nasal spray    Sig: Place 2 sprays into both nostrils daily.    Dispense:  16 g    Refill:  6   benzonatate (TESSALON) 100 MG capsule    Sig: Take 1 capsule (100 mg total) by mouth 3 (three) times daily as needed.    Dispense:  30 capsule    Refill:  0    I spent approximately 5 minutes reviewing the patient's history, current symptoms and coordinating their plan of care today.

## 2022-09-21 ENCOUNTER — Other Ambulatory Visit: Payer: Self-pay | Admitting: Family Medicine

## 2022-09-21 ENCOUNTER — Ambulatory Visit
Admission: RE | Admit: 2022-09-21 | Discharge: 2022-09-21 | Disposition: A | Discharge status: Home / Self Care | Payer: Medicare Other | Source: Ambulatory Visit | Attending: Family Medicine | Admitting: Family Medicine

## 2022-09-21 DIAGNOSIS — D241 Benign neoplasm of right breast: Secondary | ICD-10-CM

## 2022-09-21 DIAGNOSIS — Z1231 Encounter for screening mammogram for malignant neoplasm of breast: Secondary | ICD-10-CM

## 2022-09-26 ENCOUNTER — Telehealth: Payer: Medicare Other | Admitting: Emergency Medicine

## 2022-09-26 DIAGNOSIS — M545 Low back pain, unspecified: Secondary | ICD-10-CM

## 2022-09-26 MED ORDER — TIZANIDINE HCL 2 MG PO CAPS: 2.0000 mg | ORAL_CAPSULE | Freq: Three times a day (TID) | ORAL | 0 refills | Status: AC

## 2022-09-26 MED ORDER — NAPROXEN 500 MG PO TABS: 500.0000 mg | ORAL_TABLET | Freq: Two times a day (BID) | ORAL | 0 refills | Status: DC

## 2022-09-26 NOTE — Progress Notes (Signed)
We are sorry that you are not feeling well.  Here is how we plan to help!  Based on what you have shared with me it looks like you mostly have acute back pain.  Acute back pain is defined as musculoskeletal pain that can resolve in 1-3 weeks with conservative treatment.  It sounds like you might have recurrent back pain since last year. In addition to the treatment described below, I recommend you see your regular provider Ms. Falstreau, about this pain. You might benefit from seeing a specialty clinic like Sports Medicine for your problem.   I have prescribed Naprosyn 500 mg take one by mouth twice a day non-steroid anti-inflammatory (NSAID) as well as Tizanidine 2 mg every eight hours as needed which is a muscle relaxer  Some patients experience stomach irritation or in increased heartburn with anti-inflammatory drugs.  Please keep in mind that muscle relaxer's can cause fatigue and should not be taken while at work or driving.  Back pain is very common.  The pain often gets better over time.  The cause of back pain is usually not dangerous.  Most people can learn to manage their back pain on their own.  Home Care Stay active.  Start with short walks on flat ground if you can.  Try to walk farther each day. Do not sit, drive or stand in one place for more than 30 minutes.  Do not stay in bed. Do not avoid exercise or work.  Activity can help your back heal faster. Be careful when you bend or lift an object.  Bend at your knees, keep the object close to you, and do not twist. Sleep on a firm mattress.  Lie on your side, and bend your knees.  If you lie on your back, put a pillow under your knees. Only take medicines as told by your doctor. Put ice on the injured area. Put ice in a plastic bag Place a towel between your skin and the bag Leave the ice on for 15-20 minutes, 3-4 times a day for the first 2-3 days. 210 After that, you can switch between ice and heat packs. Ask your doctor about back  exercises or massage. Avoid feeling anxious or stressed.  Find good ways to deal with stress, such as exercise.  Get Help Right Way If: Your pain does not go away with rest or medicine. Your pain does not go away in 1 week. You have new problems. You do not feel well. The pain spreads into your legs. You cannot control when you poop (bowel movement) or pee (urinate) You feel sick to your stomach (nauseous) or throw up (vomit) You have belly (abdominal) pain. You feel like you may pass out (faint). If you develop a fever.  Make Sure you: Understand these instructions. Will watch your condition Will get help right away if you are not doing well or get worse.  Your e-visit answers were reviewed by a board certified advanced clinical practitioner to complete your personal care plan.  Depending on the condition, your plan could have included both over the counter or prescription medications.  If there is a problem please reply  once you have received a response from your provider.  Your safety is important to [place].  If you have drug allergies check your prescription carefully.    You can use MyChart to ask questions about today's visit, request a non-urgent call back, or ask for a work or school excuse for 24 hours related to  this e-Visit. If it has been greater than 24 hours you will need to follow up with your provider, or enter a new e-Visit to address those concerns.  You will get an e-mail in the next two days asking about your experience.  I hope that your e-visit has been valuable and will speed your recovery. Thank you for using e-visits.  I have spent 5 minutes in review of e-visit questionnaire, review and updating patient chart, medical decision making and response to patient.   Willeen Cass, PhD, FNP-BC

## 2022-09-27 ENCOUNTER — Telehealth: Payer: Medicare Other | Admitting: Physician Assistant

## 2022-09-27 DIAGNOSIS — K047 Periapical abscess without sinus: Secondary | ICD-10-CM

## 2022-09-27 MED ORDER — CLINDAMYCIN HCL 300 MG PO CAPS: 300.0000 mg | ORAL_CAPSULE | Freq: Three times a day (TID) | ORAL | 0 refills | Status: AC

## 2022-09-27 NOTE — Progress Notes (Signed)

## 2022-09-27 NOTE — Progress Notes (Signed)
I have spent 5 minutes in review of e-visit questionnaire, review and updating patient chart, medical decision making and response to patient.   Cire Deyarmin Cody Savonna Birchmeier, PA-C    

## 2022-10-10 ENCOUNTER — Other Ambulatory Visit: Payer: Medicare Other

## 2022-11-03 ENCOUNTER — Telehealth: Payer: 59 | Admitting: Nurse Practitioner

## 2022-11-03 DIAGNOSIS — J069 Acute upper respiratory infection, unspecified: Secondary | ICD-10-CM

## 2022-11-03 DIAGNOSIS — J040 Acute laryngitis: Secondary | ICD-10-CM

## 2022-11-03 MED ORDER — PROMETHAZINE-DM 6.25-15 MG/5ML PO SYRP: 5.0000 mL | ORAL_SOLUTION | Freq: Four times a day (QID) | ORAL | 0 refills | Status: DC | PRN

## 2022-11-03 NOTE — Patient Instructions (Signed)
[name], thank you for joining Gildardo Pounds, NP for today's virtual visit.  While this provider is not your primary care provider (PCP), if your PCP is located in our provider database this encounter information will be shared with them immediately following your visit.   [facility] account gives you access to today's visit and all your visits, tests, and labs performed at [facility] " click here if you don't have a [facility] account or go to [email or web address]  Consent: (Patient) VALE PERAZA provided verbal consent for this virtual visit at the beginning of the encounter.  Current Medications:  Current Outpatient Medications:    promethazine-dextromethorphan (PROMETHAZINE-DM) 6.25-15 MG/5ML syrup, Take 5 mLs by mouth 4 (four) times daily as needed for cough., Disp: 240 mL, Rfl: 0   Accu-Chek Softclix Lancets lancets, Use as instructed (Patient not taking: No sig reported), Disp: 100 each, Rfl: 12   albuterol (VENTOLIN HFA) 108 (90 Base) MCG/ACT inhaler, Inhale 1-2 puffs into the lungs every 6 (six) hours as needed for wheezing or shortness of breath., Disp: 8 g, Rfl: 0   benzonatate (TESSALON) 100 MG capsule, Take 1 capsule (100 mg total) by mouth 3 (three) times daily as needed., Disp: 30 capsule, Rfl: 0   Blood Glucose Monitoring Suppl (ACCU-CHEK GUIDE) w/Device KIT, 1 Device by Does not apply route 4 (four) times daily. (Patient not taking: No sig reported), Disp: 1 kit, Rfl: 0   Blood Pressure Monitoring (BLOOD PRESSURE KIT) DEVI, 1 kit by Does not apply route once a week. Check Blood Pressure regularly and record readings into the [employer] App.  Large Cuff.  DX O90.0 (Patient not taking: No sig reported), Disp: 1 each, Rfl: 0   ferrous sulfate 325 (65 FE) MG tablet, Take 1 tablet (325 mg total) by mouth every other day. (Patient taking differently: Take 325 mg by mouth daily.), Disp: 45 tablet, Rfl: 3   fluticasone (FLONASE) 50  MCG/ACT nasal spray, Place 2 sprays into both nostrils daily., Disp: 16 g, Rfl: 0   fluticasone (FLONASE) 50 MCG/ACT nasal spray, Place 2 sprays into both nostrils daily., Disp: 16 g, Rfl: 6   glucose blood (ACCU-CHEK GUIDE) test strip, Use to check blood sugars four times a day was instructed (Patient not taking: No sig reported), Disp: 50 each, Rfl: 12   lidocaine (LMX) 4 % cream, Apply 1 Application topically as needed., Disp: 30 g, Rfl: 0   methylPREDNISolone (MEDROL DOSEPAK) 4 MG TBPK tablet, 6 day taper; take as directed on package instructions, Disp: 21 tablet, Rfl: 0   Medications ordered in this encounter:  Meds ordered this encounter  Medications   promethazine-dextromethorphan (PROMETHAZINE-DM) 6.25-15 MG/5ML syrup    Sig: Take 5 mLs by mouth 4 (four) times daily as needed for cough.    Dispense:  240 mL    Refill:  0    Order Specific Question:   Supervising Provider    Answer:   Chase Picket [[id number]]     *If you need refills on other medications prior to your next appointment, please contact your pharmacy*  Follow-Up: Call back or seek an in-person evaluation if the symptoms worsen or if the condition fails to improve as anticipated.  [facility] [phone]  Other Instructions INSTRUCTIONS: use a humidifier for nasal congestion Drink plenty of fluids, rest and wash hands frequently to avoid the spread of infection Alternate tylenol and Motrin for relief of fever    If  you have been instructed to have an in-person evaluation today at a local Urgent Care facility, please use the link below. It will take you to a list of all of our available [facility], including address, phone number and hours of operation. Please do not delay care.  [facility]  If you or a family member do not have a primary care provider, use the link below to schedule a visit and establish care. When you choose a [facility] primary care physician or  advanced practice provider, you gain a long-term partner in health. Find a Primary Care Provider  Learn more about [facility]'s in-office and virtual care options: [facility]

## 2022-11-03 NOTE — Progress Notes (Signed)
Virtual Visit Consent   MALEY VENEZIA, you are scheduled for a virtual visit with a [facility] provider today. Just as with appointments in the office, your consent must be obtained to participate. Your consent will be active for this visit and any virtual visit you may have with one of our providers in the next 365 days. If you have a MyChart account, a copy of this consent can be sent to you electronically.  As this is a virtual visit, video technology does not allow for your provider to perform a traditional examination. This may limit your provider's ability to fully assess your condition. If your provider identifies any concerns that need to be evaluated in person or the need to arrange testing (such as labs, EKG, etc.), we will make arrangements to do so. Although advances in technology are sophisticated, we cannot ensure that it will always work on either your end or our end. If the connection with a video visit is poor, the visit may have to be switched to a telephone visit. With either a video or telephone visit, we are not always able to ensure that we have a secure connection.  By engaging in this virtual visit, you consent to the provision of healthcare and authorize for your insurance to be billed (if applicable) for the services provided during this visit. Depending on your insurance coverage, you may receive a charge related to this service.  I need to obtain your verbal consent now. Are you willing to proceed with your visit today? Tiffany Hall has provided verbal consent on 11/03/2022 for a virtual visit (video or telephone). Gildardo Pounds, NP  Date: 11/03/2022 9:32 AM  Virtual Visit via Video Note   I, Gildardo Pounds, connected with  Tiffany Hall  ([id number], 1985/11/11) on 11/03/22 at  9:30 AM EST by a video-enabled telemedicine application and verified that I am speaking with the correct person using two identifiers.  Location: Patient: Virtual Visit Location  Patient: Home Provider: Virtual Visit Location Provider: Home Office   I discussed the limitations of evaluation and management by telemedicine and the availability of in person appointments. The patient expressed understanding and agreed to proceed.    History of Present Illness: Tiffany Hall is a 37 y.o. who identifies as a female who was assigned female at birth, and is being seen today for laryngitis.  Tiffany Hall currently has concerns of vocal hoarseness/laryngitis.  She states her children were diagnosed with RSV kids last week.  She was also experiencing some minor cold symptoms at that time which have mostly resolved however at this time she is mostly dealing with nasal congestion, postnasal drip and persistent cough along with laryngitis.      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 No  Did it involve sudden or severe rash/hives, skin peeling, or any reaction on the inside of your mouth or nose? No  Did you need to seek medical attention at a hospital or doctor's office? No  When did it last happen?      last year  If all above  answers are "NO", may proceed with cephalosporin use.  After dental work. Pt unsure if allergy or bad swelling related to surgery and abscess. Pt reports having penicillin and possibly amoxicillin afterwards.   Nubain [Nalbuphine Hcl] Swelling, Palpitations and Other (See Comments)    Pt states that she is able to take Percocet without any problems.      Medications:  Current Outpatient Medications:    promethazine-dextromethorphan (PROMETHAZINE-DM) 6.25-15 MG/5ML syrup, Take 5 mLs by mouth 4 (four) times daily as needed for cough., Disp: 240 mL, Rfl: 0   Accu-Chek Softclix Lancets lancets, Use as instructed (Patient not taking: No sig reported), Disp: 100 each, Rfl: 12   albuterol (VENTOLIN HFA) 108 (90 Base) MCG/ACT inhaler, Inhale 1-2 puffs into the lungs every 6 (six) hours as needed for wheezing or shortness of breath., Disp: 8 g, Rfl: 0   benzonatate (TESSALON) 100 MG capsule, Take 1 capsule (100 mg total) by mouth 3 (three) times daily as needed., Disp: 30 capsule, Rfl: 0   Blood Glucose Monitoring Suppl (ACCU-CHEK GUIDE) w/Device KIT, 1 Device by Does not apply route 4 (four) times daily. (Patient not taking: No sig reported), Disp: 1 kit, Rfl: 0   Blood Pressure Monitoring (BLOOD PRESSURE KIT) DEVI, 1 kit by Does not apply route once a week. Check Blood Pressure regularly and record readings into the [employer] App.  Large Cuff.  DX O90.0 (Patient not taking: No sig reported), Disp: 1 each, Rfl: 0   ferrous sulfate 325 (65 FE) MG tablet, Take 1 tablet (325 mg total) by mouth every other day. (Patient taking differently: Take 325 mg by mouth daily.), Disp: 45 tablet, Rfl: 3   fluticasone (FLONASE) 50 MCG/ACT nasal spray, Place 2 sprays into both nostrils daily., Disp: 16 g, Rfl: 0   fluticasone (FLONASE) 50 MCG/ACT nasal spray, Place 2 sprays into both nostrils daily., Disp: 16 g, Rfl: 6   glucose blood (ACCU-CHEK GUIDE) test strip, Use to check blood sugars four times a day was instructed  (Patient not taking: No sig reported), Disp: 50 each, Rfl: 12   lidocaine (LMX) 4 % cream, Apply 1 Application topically as needed., Disp: 30 g, Rfl: 0   methylPREDNISolone (MEDROL DOSEPAK) 4 MG TBPK tablet, 6 day taper; take as directed on package instructions, Disp: 21 tablet, Rfl: 0  Observations/Objective: Patient is well-developed, well-nourished in no acute distress.  Resting comfortably  at home.  Head is normocephalic, atraumatic.  No labored breathing.  Vocal hoarseness is present  Patient is alert and oriented at baseline.    Assessment and Plan: 1. Viral URI with cough - promethazine-dextromethorphan (PROMETHAZINE-DM) 6.25-15 MG/5ML syrup; Take 5 mLs by mouth 4 (four) times daily as needed for cough.  Dispense: 240 mL; Refill: 0  2. Laryngitis - promethazine-dextromethorphan (PROMETHAZINE-DM) 6.25-15 MG/5ML syrup; Take 5 mLs by mouth 4 (four) times daily as needed for cough.  Dispense: 240 mL; Refill: 0  INSTRUCTIONS: use a humidifier for nasal congestion Drink plenty of fluids, rest and wash hands frequently to avoid the spread of infection Alternate tylenol and Motrin for relief of fever   Follow Up  Instructions: I discussed the assessment and treatment plan with the patient. The patient was provided an opportunity to ask questions and all were answered. The patient agreed with the plan and demonstrated an understanding of the instructions.  A copy of instructions were sent to the patient via MyChart unless otherwise noted below.    The patient was advised to call back or seek an in-person evaluation if the symptoms worsen or if the condition fails to improve as anticipated.  Time:  I spent 12 minutes with the patient via telehealth technology discussing the above problems/concerns.    Gildardo Pounds, NP

## 2022-11-07 ENCOUNTER — Other Ambulatory Visit: Payer: Medicare Other

## 2022-11-21 ENCOUNTER — Telehealth: Payer: 59 | Admitting: Nurse Practitioner

## 2022-11-21 DIAGNOSIS — J111 Influenza due to unidentified influenza virus with other respiratory manifestations: Secondary | ICD-10-CM | POA: Diagnosis not present

## 2022-11-21 MED ORDER — OSELTAMIVIR PHOSPHATE 75 MG PO CAPS: 75.0000 mg | ORAL_CAPSULE | Freq: Two times a day (BID) | ORAL | 0 refills | Status: DC

## 2022-11-21 NOTE — Progress Notes (Signed)
E visit for Flu like symptoms   We are sorry that you are not feeling well.  Here is how we plan to help! Based on what you have shared with me it looks like you may have flu-like symptoms that should be watched but do not seem to indicate anti-viral treatment.  Influenza or "the flu" is   an infection caused by a respiratory virus. The flu virus is highly contagious and persons who did not receive their yearly flu vaccination may "catch" the flu from close contact.  We have anti-viral medications to treat the viruses that cause this infection. They are not a "cure" and only shorten the course of the infection. These prescriptions are most effective when they are given within the first 2 days of "flu" symptoms. Antiviral medication are indicated if you have a high risk of complications from the flu. You should  also consider an antiviral medication if you are in close contact with someone who is at risk. These medications can help patients avoid complications from the flu  but have side effects that you should know. Possible side effects from Tamiflu or oseltamivir include nausea, vomiting, diarrhea, dizziness, headaches, eye redness, sleep problems or other respiratory symptoms. You should not take Tamiflu if you have an allergy to oseltamivir or any to the ingredients in Tamiflu.  Based upon your symptoms and potential risk factors I have prescribed Oseltamivir (Tamiflu).  It has been sent to your designated pharmacy.  You will take one 75 mg capsule orally twice a day for the next 5 days.  ANYONE WHO HAS FLU SYMPTOMS SHOULD: Stay home. The flu is highly contagious and going out or to work exposes others! Be sure to drink plenty of fluids. Water is fine as well as fruit juices, sodas and electrolyte beverages. You may want to stay away from caffeine or alcohol. If you are nauseated, try taking small sips of liquids. How do you know if you are getting enough fluid? Your urine should be a pale yellow  or almost colorless. Get rest. Taking a steamy shower or using a humidifier may help nasal congestion and ease sore throat pain. Using a saline nasal spray works much the same way. Cough drops, hard candies and sore throat lozenges may ease your cough. Line up a caregiver. Have someone check on you regularly.   GET HELP RIGHT AWAY IF: You cannot keep down liquids or your medications. You become short of breath Your fell like you are going to pass out or loose consciousness. Your symptoms persist after you have completed your treatment plan MAKE SURE YOU  Understand these instructions. Will watch your condition. Will get help right away if you are not doing well or get worse.  Your e-visit answers were reviewed by a board certified advanced clinical practitioner to complete your personal care plan.  Depending on the condition, your plan could have included both over the counter or prescription medications.  If there is a problem please reply  once you have received a response from your provider.  Your safety is important to [place].  If you have drug allergies check your prescription carefully.    You can use MyChart to ask questions about today's visit, request a non-urgent call back, or ask for a work or school excuse for 24 hours related to this e-Visit. If it has been greater than 24 hours you will need to follow up with your provider, or enter a new e-Visit to address those concerns.  You  will get an e-mail in the next two days asking about your experience.  I hope that your e-visit has been valuable and will speed your recovery. Thank you for using e-visits.    Mary-Margaret Hassell Done, FNP   5-10 minutes spent reviewing and documenting in chart.

## 2022-12-15 ENCOUNTER — Telehealth: Payer: 59 | Admitting: Nurse Practitioner

## 2022-12-15 DIAGNOSIS — K047 Periapical abscess without sinus: Secondary | ICD-10-CM | POA: Diagnosis not present

## 2022-12-15 MED ORDER — NAPROXEN 500 MG PO TABS: 500.0000 mg | ORAL_TABLET | Freq: Two times a day (BID) | ORAL | 0 refills | Status: DC

## 2022-12-15 MED ORDER — CLINDAMYCIN HCL 300 MG PO CAPS: 300.0000 mg | ORAL_CAPSULE | Freq: Three times a day (TID) | ORAL | 0 refills | Status: DC

## 2022-12-15 NOTE — Progress Notes (Signed)
E-Visit for Dental Pain  We are sorry that you are not feeling well.  Here is how we plan to help!  Based on what you have shared with me in the questionnaire, it sounds like you have dental pain  Clindamycin 300mg 3 times a day for 7 days and Naprosyn 500mg 2 times a day for 7 days for discomfort  It is imperative that you see a dentist within 10 days of this eVisit to determine the cause of the dental pain and be sure it is adequately treated  A toothache or tooth pain is caused when the nerve in the root of a tooth or surrounding a tooth is irritated. Dental (tooth) infection, decay, injury, or loss of a tooth are the most common causes of dental pain. Pain may also occur after an extraction (tooth is pulled out). Pain sometimes originates from other areas and radiates to the jaw, thus appearing to be tooth pain.Bacteria growing inside your mouth can contribute to gum disease and dental decay, both of which can cause pain. A toothache occurs from inflammation of the central portion of the tooth called pulp. The pulp contains nerve endings that are very sensitive to pain. Inflammation to the pulp or pulpitis may be caused by dental cavities, trauma, and infection.    HOME CARE:   For toothaches: Over-the-counter pain medications such as acetaminophen or ibuprofen may be used. Take these as directed on the package while you arrange for a dental appointment. Avoid very cold or hot foods, because they may make the pain worse. You may get relief from biting on a cotton ball soaked in oil of cloves. You can get oil of cloves at most drug stores.  For jaw pain:  Aspirin may be helpful for problems in the joint of the jaw in adults. If pain happens every time you open your mouth widely, the temporomandibular joint (TMJ) may be the source of the pain. Yawning or taking a large bite of food may worsen the pain. An appointment with your doctor or dentist will help you find the cause.     GET HELP  RIGHT AWAY IF:  You have a high fever or chills If you have had a recent head or face injury and develop headache, light headedness, nausea, vomiting, or other symptoms that concern you after an injury to your face or mouth, you could have a more serious injury in addition to your dental injury. A facial rash associated with a toothache: This condition may improve with medication. Contact your doctor for them to decide what is appropriate. Any jaw pain occurring with chest pain: Although jaw pain is most commonly caused by dental disease, it is sometimes referred pain from other areas. People with heart disease, especially people who have had stents placed, people with diabetes, or those who have had heart surgery may have jaw pain as a symptom of heart attack or angina. If your jaw or tooth pain is associated with lightheadedness, sweating, or shortness of breath, you should see a doctor as soon as possible. Trouble swallowing or excessive pain or bleeding from gums: If you have a history of a weakened immune system, diabetes, or steroid use, you may be more susceptible to infections. Infections can often be more severe and extensive or caused by unusual organisms. Dental and gum infections in people with these conditions may require more aggressive treatment. An abscess may need draining or IV antibiotics, for example.  MAKE SURE YOU   Understand these instructions.   Will watch your condition. Will get help right away if you are not doing well or get worse.  Thank you for choosing an e-visit.  Your e-visit answers were reviewed by a board certified advanced clinical practitioner to complete your personal care plan. Depending upon the condition, your plan could have included both over the counter or prescription medications.  Please review your pharmacy choice. Make sure the pharmacy is open so you can pick up prescription now. If there is a problem, you may contact your provider through MyChart  messaging and have the prescription routed to another pharmacy.  Your safety is important to us. If you have drug allergies check your prescription carefully.   For the next 24 hours you can use MyChart to ask questions about today's visit, request a non-urgent call back, or ask for a work or school excuse. You will get an email in the next two days asking about your experience. I hope that your e-visit has been valuable and will speed your recovery  Mary-Margaret Rees Matura, FNP   5-10 minutes spent reviewing and documenting in chart.  

## 2022-12-19 ENCOUNTER — Ambulatory Visit
Admission: RE | Admit: 2022-12-19 | Discharge: 2022-12-19 | Disposition: A | Discharge status: Home / Self Care | Payer: Medicaid Other | Source: Ambulatory Visit | Attending: Family Medicine | Admitting: Family Medicine

## 2022-12-19 ENCOUNTER — Ambulatory Visit
Admission: RE | Admit: 2022-12-19 | Discharge: 2022-12-19 | Disposition: A | Discharge status: Home / Self Care | Payer: Medicare Other | Source: Ambulatory Visit | Attending: Family Medicine | Admitting: Family Medicine

## 2022-12-19 DIAGNOSIS — D241 Benign neoplasm of right breast: Secondary | ICD-10-CM

## 2022-12-21 ENCOUNTER — Telehealth: Payer: Medicaid Other | Admitting: Family Medicine

## 2022-12-21 DIAGNOSIS — J069 Acute upper respiratory infection, unspecified: Secondary | ICD-10-CM

## 2022-12-21 MED ORDER — FLUTICASONE PROPIONATE 50 MCG/ACT NA SUSP: 2.0000 | Freq: Every day | NASAL | 0 refills | Status: DC

## 2022-12-21 MED ORDER — ALBUTEROL SULFATE HFA 108 (90 BASE) MCG/ACT IN AERS: 2.0000 | INHALATION_SPRAY | Freq: Four times a day (QID) | RESPIRATORY_TRACT | 0 refills | Status: AC | PRN

## 2022-12-21 MED ORDER — BENZONATATE 100 MG PO CAPS: 100.0000 mg | ORAL_CAPSULE | Freq: Three times a day (TID) | ORAL | 0 refills | Status: DC | PRN

## 2022-12-21 NOTE — Progress Notes (Signed)
E-Visit for Upper Respiratory Infection   We are sorry you are not feeling well.  Here is how we plan to help!  Based on what you have shared with me, it looks like you may have a viral upper respiratory infection.  Upper respiratory infections are caused by a large number of viruses; however, rhinovirus is the most common cause.   Symptoms vary from person to person, with common symptoms including sore throat, cough, fatigue or lack of energy and feeling of general discomfort.  A low-grade fever of up to 100.4 may present, but is often uncommon.  Symptoms vary however, and are closely related to a person's age or underlying illnesses.  The most common symptoms associated with an upper respiratory infection are nasal discharge or congestion, cough, sneezing, headache and pressure in the ears and face.  These symptoms usually persist for about 3 to 10 days, but can last up to 2 weeks.  It is important to know that upper respiratory infections do not cause serious illness or complications in most cases.    Upper respiratory infections can be transmitted from person to person, with the most common method of transmission being a person's hands.  The virus is able to live on the skin and can infect other persons for up to 2 hours after direct contact.  Also, these can be transmitted when someone coughs or sneezes; thus, it is important to cover the mouth to reduce this risk.  To keep the spread of the illness at [place], good hand hygiene is very important.  This is an infection that is most likely caused by a virus. There are no specific treatments other than to help you with the symptoms until the infection runs its course.  We are sorry you are not feeling well.  Here is how we plan to help!   For nasal congestion, you may use an oral decongestants such as Mucinex D or if you have glaucoma or high blood pressure use plain Mucinex.  Saline nasal spray or nasal drops can help and can safely be used as often as  needed for congestion.  For your congestion, I have prescribed Fluticasone nasal spray one spray in each nostril twice a day  If you do not have a history of heart disease, hypertension, diabetes or thyroid disease, prostate/bladder issues or glaucoma, you may also use Sudafed to treat nasal congestion.  It is highly recommended that you consult with a pharmacist or your primary care physician to ensure this medication is safe for you to take.     If you have a cough, you may use cough suppressants such as Delsym and Robitussin.  If you have glaucoma or high blood pressure, you can also use Coricidin HBP.   For cough I have prescribed for you A prescription cough medication called Tessalon Perles 100 mg. You may take 1-2 capsules every 8 hours as needed for cough  I will also order an albuterol inhaler for your wheezing and cough also.   If you have a sore or scratchy throat, use a saltwater gargle-  to  teaspoon of salt dissolved in a 4-ounce to 8-ounce glass of warm water.  Gargle the solution for approximately 15-30 seconds and then spit.  It is important not to swallow the solution.  You can also use throat lozenges/cough drops and Chloraseptic spray to help with throat pain or discomfort.  Warm or cold liquids can also be helpful in relieving throat pain.  For headache, pain or  general discomfort, you can use Ibuprofen or Tylenol as directed.   Some authorities believe that zinc sprays or the use of Echinacea may shorten the course of your symptoms.   HOME CARE Only take medications as instructed by your medical team. Be sure to drink plenty of fluids. Water is fine as well as fruit juices, sodas and electrolyte beverages. You may want to stay away from caffeine or alcohol. If you are nauseated, try taking small sips of liquids. How do you know if you are getting enough fluid? Your urine should be a pale yellow or almost colorless. Get rest. Taking a steamy shower or using a humidifier may  help nasal congestion and ease sore throat pain. You can place a towel over your head and breathe in the steam from hot water coming from a faucet. Using a saline nasal spray works much the same way. Cough drops, hard candies and sore throat lozenges may ease your cough. Avoid close contacts especially the very young and the elderly Cover your mouth if you cough or sneeze Always remember to wash your hands.   GET HELP RIGHT AWAY IF: You develop worsening fever. If your symptoms do not improve within 10 days You develop yellow or green discharge from your nose over 3 days. You have coughing fits You develop a severe head ache or visual changes. You develop shortness of breath, difficulty breathing or start having chest pain Your symptoms persist after you have completed your treatment plan  MAKE SURE YOU  Understand these instructions. Will watch your condition. Will get help right away if you are not doing well or get worse.  Thank you for choosing an e-visit.  Your e-visit answers were reviewed by a board certified advanced clinical practitioner to complete your personal care plan. Depending upon the condition, your plan could have included both over the counter or prescription medications.  Please review your pharmacy choice. Make sure the pharmacy is open so you can pick up prescription now. If there is a problem, you may contact your provider through [employer] and have the prescription routed to another pharmacy.  Your safety is important to [place]. If you have drug allergies check your prescription carefully.   For the next 24 hours you can use MyChart to ask questions about today's visit, request a non-urgent call back, or ask for a work or school excuse. You will get an email in the next two days asking about your experience. I hope that your e-visit has been valuable and will speed your recovery.    I provided 5 minutes of non face-to-face time during this encounter for  chart review, medication and order placement, as well as and documentation.

## 2022-12-26 ENCOUNTER — Other Ambulatory Visit (HOSPITAL_COMMUNITY)
Admission: RE | Admit: 2022-12-26 | Discharge: 2022-12-26 | Disposition: A | Discharge status: Home / Self Care | Payer: 59 | Source: Ambulatory Visit | Attending: Obstetrics and Gynecology | Admitting: Obstetrics and Gynecology

## 2022-12-26 ENCOUNTER — Ambulatory Visit (INDEPENDENT_AMBULATORY_CARE_PROVIDER_SITE_OTHER): Payer: 59 | Admitting: Obstetrics and Gynecology

## 2022-12-26 ENCOUNTER — Encounter: Payer: Self-pay | Admitting: Obstetrics and Gynecology

## 2022-12-26 VITALS — BP 135/88 | HR 76 | Ht 63.0 in | Wt 187.0 lb

## 2022-12-26 DIAGNOSIS — Z0000 Encounter for general adult medical examination without abnormal findings: Secondary | ICD-10-CM | POA: Diagnosis not present

## 2022-12-26 DIAGNOSIS — R87810 Cervical high risk human papillomavirus (HPV) DNA test positive: Secondary | ICD-10-CM | POA: Insufficient documentation

## 2022-12-26 DIAGNOSIS — Z23 Encounter for immunization: Secondary | ICD-10-CM | POA: Diagnosis not present

## 2022-12-26 LAB — POCT URINE PREGNANCY: Preg Test, Ur: NEGATIVE

## 2022-12-26 NOTE — Progress Notes (Signed)
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 Patient reports feeling well and is without any complaints.   Past Medical History:  Diagnosis Date   Anemia    Anxiety    Depression    GERD (gastroesophageal reflux disease)    medication not needed   Gestational diabetes    insulin   Heart murmur    History of abnormal cervical Pap smear    2009   History of cardiac murmur as a child    History of chlamydia    2008   History of gallstones    2012   History of gonorrhea    2008   History of ovarian cyst    History of trichomonal vaginitis    2008   HSV (herpes simplex virus) anogenital infection    Hypertension    PIH with pregnancy   Migraine headache    Preeclampsia in postpartum period 01/15/2015   Pregnancy induced hypertension    Sickle cell trait ([place])    Vaginal Pap smear, abnormal    HPV +   Past Surgical History:  Procedure Laterality Date   CESAREAN SECTION MULTI-GESTATIONAL N/A 01/05/2021   Procedure: CESAREAN SECTION MULTI-GESTATIONAL;  Surgeon: Mora Bellman, MD;  Location: [facility];  Service: Obstetrics;  Laterality: N/A;   COLPOSCOPY     dilatation and currettage  N/A    DILATION AND CURETTAGE OF UTERUS  2009   W/  SUCTION --  required blood transfusion   DILATION AND EVACUATION N/A 09/14/2015   Procedure: DILATATION AND EVACUATION;  Surgeon: Linda Hedges, DO;  Location: [facility];  Service: Gynecology;  Laterality: N/A;   Family History  Problem Relation Age of Onset   Breast cancer Mother    Diabetes Mother    Hypertension Mother    Hepatitis Mother    Cancer Mother 3       Breast, cervical   Breast cancer Maternal Grandmother    Diabetes Maternal Grandmother    Hypertension Maternal Grandmother    Cancer Maternal Grandmother 65       Breast   Anesthesia problems Neg Hx    Other Neg Hx    Social History   Tobacco Use   Smoking status: Former    Packs/day: 0    Types: Cigarettes    Quit  date: 11/05/2010    Years since quitting: 12.1   Smokeless tobacco: Never   Tobacco comments:    quit with preg  Vaping Use   Vaping Use: Never used  Substance Use Topics   Alcohol use: No    Comment: occ   Drug use: No    Types: MDMA (Ecstacy)    Comment: prior to this pregnancy in 2009   ROS See pertinent in HPI. All other systems reviewed and non contributory Blood pressure 135/88, pulse 76, height 5\' 3"  (1.6 m), weight 187 lb (84.8 kg), last menstrual period 09/01/2022, not currently breastfeeding.  GENERAL: Well-developed, well-nourished female in no acute distress.  PELVIC: Normal external female genitalia. Vagina is pink and rugated.  Normal discharge. Normal appearing cervix. Uterus is normal in size. No adnexal mass or tenderness. Chaperone present during the pelvic exam EXTREMITIES: No cyanosis, clubbing, or edema, 2+ distal pulses.  10/2022 pap smear Normal with positive HRHPV  A/P Patient with abnormal pap smear here for colposcopy Patient given informed consent, signed copy in the chart, time out was performed.  Placed in lithotomy position. Cervix  viewed with speculum and colposcope after application of acetic acid.   Colposcopy adequate?  yes Acetowhite lesions?10-12 o'clock Punctation?no Mosaicism?  no Abnormal vasculature?  no Biopsies?yes 10 o'clock ECC?yes  COMMENTS: Patient was given post procedure instructions.  She will return in 2 weeks for results. Discussed benefits of Gardasil vaccine which patient is interested in receiving  Mora Bellman, MD

## 2022-12-26 NOTE — Addendum Note (Signed)
Addended by: Tamela Oddi on: 12/26/2022 11:59 AM   Modules accepted: Orders

## 2022-12-26 NOTE — Progress Notes (Addendum)
37 y.o. GYN presents for COLPO, for HPV+ PAP.  UPT today is Negative  Gardasil Vaccine, given in RUO gluteal, tolerated well. 2nd injection due Feb 25, 2023 and 3rd injection due Sept. 20, 2024.

## 2022-12-28 LAB — SURGICAL PATHOLOGY

## 2022-12-30 ENCOUNTER — Encounter: Payer: Self-pay | Admitting: Obstetrics & Gynecology

## 2022-12-30 DIAGNOSIS — N870 Mild cervical dysplasia: Secondary | ICD-10-CM | POA: Insufficient documentation

## 2023-01-24 ENCOUNTER — Telehealth: Payer: 59 | Admitting: Physician Assistant

## 2023-01-24 DIAGNOSIS — R109 Unspecified abdominal pain: Secondary | ICD-10-CM | POA: Diagnosis not present

## 2023-01-24 MED ORDER — DICYCLOMINE HCL 10 MG PO CAPS: 10.0000 mg | ORAL_CAPSULE | Freq: Three times a day (TID) | ORAL | 0 refills | Status: AC

## 2023-01-24 NOTE — Progress Notes (Signed)
E-Visit for Diarrhea  We are sorry that you are not feeling well.  Here is how we plan to help!  Based on what you have shared with me it looks like you have Acute Infectious Diarrhea.  Most cases of acute diarrhea are due to infections with virus and bacteria and are self-limited conditions lasting less than 14 days.  For your symptoms you may take Imodium 2 mg tablets that are over the counter at your local pharmacy. Take two tablet now and then one after each loose stool up to 6 a day.  Antibiotics are not needed for most people with diarrhea.  I have prescribed: Bentyl 10 mg Take 1 tablet before each meal and at bedtime for 7 days for abdominal cramping  HOME CARE We recommend changing your diet to help with your symptoms for the next few days. Drink plenty of fluids that contain water salt and sugar. Sports drinks such as Gatorade may help.  You may try broths, soups, bananas, applesauce, soft breads, mashed potatoes or crackers.  You are considered infectious for as long as the diarrhea continues. Hand washing or use of alcohol based hand sanitizers is recommend. It is best to stay out of work or school until your symptoms stop.   GET HELP RIGHT AWAY If you have dark yellow colored urine or do not pass urine frequently you should drink more fluids.   If your symptoms worsen  If you feel like you are going to pass out (faint) You have a new problem  MAKE SURE YOU  Understand these instructions. Will watch your condition. Will get help right away if you are not doing well or get worse.  Thank you for choosing an e-visit.  Your e-visit answers were reviewed by a board certified advanced clinical practitioner to complete your personal care plan. Depending upon the condition, your plan could have included both over the counter or prescription medications.  Please review your pharmacy choice. Make sure the pharmacy is open so you can pick up prescription now. If there is a problem,  you may contact your provider through [employer] and have the prescription routed to another pharmacy.  Your safety is important to [place]. If you have drug allergies check your prescription carefully.   For the next 24 hours you can use MyChart to ask questions about today's visit, request a non-urgent call back, or ask for a work or school excuse. You will get an email in the next two days asking about your experience. I hope that your e-visit has been valuable and will speed your recovery.  I have spent 5 minutes in review of e-visit questionnaire, review and updating patient chart, medical decision making and response to patient.   Margaretann Loveless, PA-C

## 2023-02-03 IMAGING — DX DG CHEST 1V PORT
1 series · 1 of 1 positions shown · non-contrast
Comparison: Chest x-ray 10/24/2021

CLINICAL DATA: Chest pain.

EXAM:
PORTABLE CHEST 1 VIEW

[chest ap]
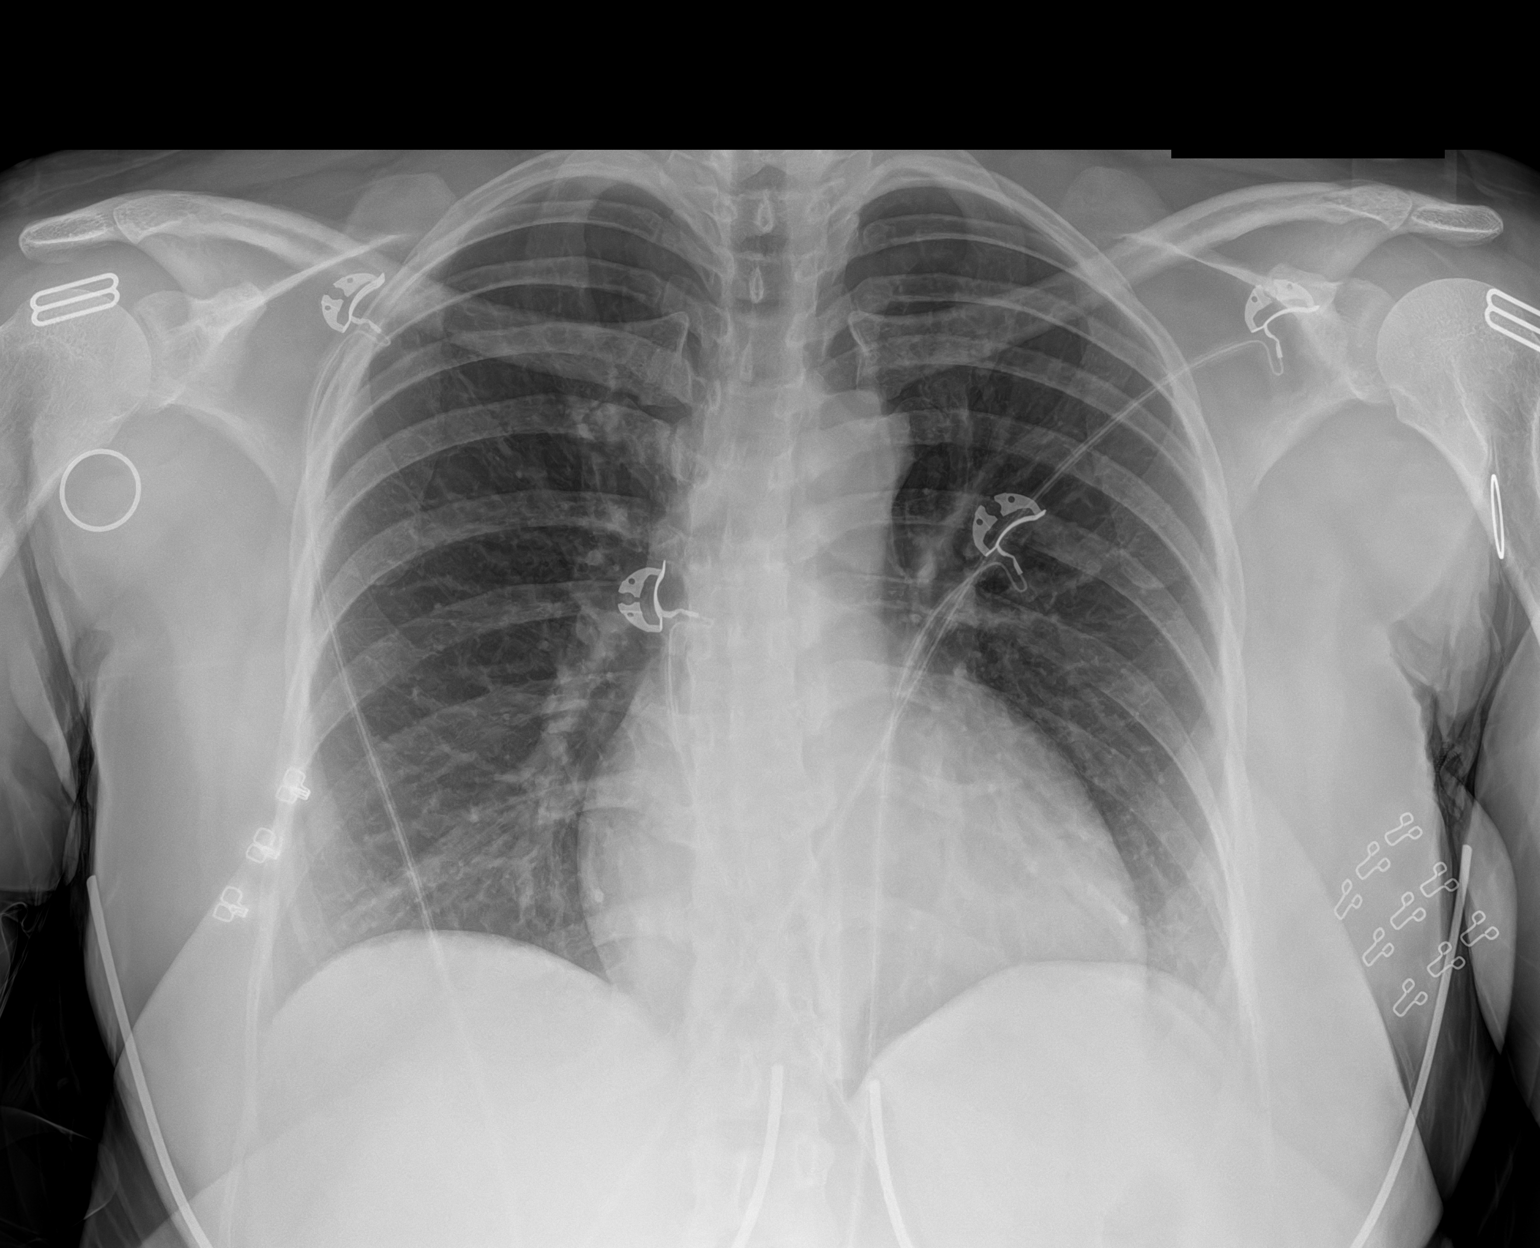

[1 of 1 positions shown; findings below may reference images not displayed]

FINDINGS: The heart size and mediastinal contours are within normal limits.
Both lungs are clear. The visualized skeletal structures are
unremarkable.
IMPRESSION: No active disease.

## 2023-02-11 ENCOUNTER — Telehealth: Payer: 59 | Admitting: Physician Assistant

## 2023-02-11 DIAGNOSIS — N760 Acute vaginitis: Secondary | ICD-10-CM

## 2023-02-11 NOTE — Progress Notes (Signed)
Because of uncertainty of cause of symptoms and history of frequent bv but also recent antibiotic use which raises concern for yeast or multiple infections at once, I feel your condition warrants further evaluation and I recommend that you be seen in a face to face visit.   NOTE: There will be NO CHARGE for this eVisit   If you are having a true medical emergency please call 911.      For an urgent face to face visit, [place] has eight urgent care centers for your convenience:   NEW!! [facility] at [facility] Get Driving Directions [phone] [address], [address] [place], [address]    [facility] Center at [facility] Get Driving Directions [phone] [address] [place], [place] [address]   [facility] Health Urgent Care Center [facility]) Get Driving Directions [phone] [address] [place], [place] [address]  [facility] Health Urgent Care Center [facility]) Get Driving Directions [phone] [address] [place],  [place]  [address]  [facility] Health Urgent Care Center [facility] - at [employer]  [phone] [address] [place],  [place] [address]   [facility] Health Urgent Care at [facility] Get Driving Directions [phone] [address] [place], [place] [address]   [facility] Health Ur[facility] Care at [facility] Get Driving Directions  [phone] [address] [place], [place] [address]   [facility] Health Urgent Care at [facility] Directions [phone] [address], [address] [place], [place] [address]  Your MyChart E-visit questionnaire answers were reviewed by a board certified advanced clinical practitioner to complete your personal care plan based on your specific symptoms.  Thank you for using e-Visits.

## 2023-02-18 ENCOUNTER — Telehealth: Payer: 59 | Admitting: Physician Assistant

## 2023-02-18 DIAGNOSIS — K047 Periapical abscess without sinus: Secondary | ICD-10-CM | POA: Diagnosis not present

## 2023-02-18 MED ORDER — IBUPROFEN 800 MG PO TABS: 800.0000 mg | ORAL_TABLET | Freq: Three times a day (TID) | ORAL | 0 refills | Status: DC | PRN

## 2023-02-18 MED ORDER — CLINDAMYCIN HCL 300 MG PO CAPS: 300.0000 mg | ORAL_CAPSULE | Freq: Three times a day (TID) | ORAL | 0 refills | Status: DC

## 2023-02-18 NOTE — Addendum Note (Signed)
Addended by: Margaretann Loveless on: 02/18/2023 04:08 PM   Modules accepted: Orders

## 2023-02-18 NOTE — Progress Notes (Signed)
E-Visit for Dental Pain  We are sorry that you are not feeling well.  Here is how we plan to help!  Based on what you have shared with me in the questionnaire, it sounds like you have a dental infection.  Clindamycin 300mg 3 times a day for 7 days  It is imperative that you see a dentist within 10 days of this eVisit to determine the cause of the dental pain and be sure it is adequately treated  A toothache or tooth pain is caused when the nerve in the root of a tooth or surrounding a tooth is irritated. Dental (tooth) infection, decay, injury, or loss of a tooth are the most common causes of dental pain. Pain may also occur after an extraction (tooth is pulled out). Pain sometimes originates from other areas and radiates to the jaw, thus appearing to be tooth pain.Bacteria growing inside your mouth can contribute to gum disease and dental decay, both of which can cause pain. A toothache occurs from inflammation of the central portion of the tooth called pulp. The pulp contains nerve endings that are very sensitive to pain. Inflammation to the pulp or pulpitis may be caused by dental cavities, trauma, and infection.    HOME CARE:   For toothaches: Over-the-counter pain medications such as acetaminophen or ibuprofen may be used. Take these as directed on the package while you arrange for a dental appointment. Avoid very cold or hot foods, because they may make the pain worse. You may get relief from biting on a cotton ball soaked in oil of cloves. You can get oil of cloves at most drug stores.  For jaw pain:  Aspirin may be helpful for problems in the joint of the jaw in adults. If pain happens every time you open your mouth widely, the temporomandibular joint (TMJ) may be the source of the pain. Yawning or taking a large bite of food may worsen the pain. An appointment with your doctor or dentist will help you find the cause.     GET HELP RIGHT AWAY IF:  You have a high fever or  chills If you have had a recent head or face injury and develop headache, light headedness, nausea, vomiting, or other symptoms that concern you after an injury to your face or mouth, you could have a more serious injury in addition to your dental injury. A facial rash associated with a toothache: This condition may improve with medication. Contact your doctor for them to decide what is appropriate. Any jaw pain occurring with chest pain: Although jaw pain is most commonly caused by dental disease, it is sometimes referred pain from other areas. People with heart disease, especially people who have had stents placed, people with diabetes, or those who have had heart surgery may have jaw pain as a symptom of heart attack or angina. If your jaw or tooth pain is associated with lightheadedness, sweating, or shortness of breath, you should see a doctor as soon as possible. Trouble swallowing or excessive pain or bleeding from gums: If you have a history of a weakened immune system, diabetes, or steroid use, you may be more susceptible to infections. Infections can often be more severe and extensive or caused by unusual organisms. Dental and gum infections in people with these conditions may require more aggressive treatment. An abscess may need draining or IV antibiotics, for example.  MAKE SURE YOU   Understand these instructions. Will watch your condition. Will get help right away if you   are not doing well or get worse.  Thank you for choosing an e-visit.  Your e-visit answers were reviewed by a board certified advanced clinical practitioner to complete your personal care plan. Depending upon the condition, your plan could have included both over the counter or prescription medications.  Please review your pharmacy choice. Make sure the pharmacy is open so you can pick up prescription now. If there is a problem, you may contact your provider through MyChart messaging and have the prescription routed to  another pharmacy.  Your safety is important to us. If you have drug allergies check your prescription carefully.   For the next 24 hours you can use MyChart to ask questions about today's visit, request a non-urgent call back, or ask for a work or school excuse. You will get an email in the next two days asking about your experience. I hope that your e-visit has been valuable and will speed your recovery.  I have spent 5 minutes in review of e-visit questionnaire, review and updating patient chart, medical decision making and response to patient.   Zakira Ressel M Tristine Langi, PA-C  

## 2023-02-25 ENCOUNTER — Ambulatory Visit
Admission: EM | Admit: 2023-02-25 | Discharge: 2023-02-25 | Disposition: A | Discharge status: Home / Self Care | Payer: 59

## 2023-02-25 ENCOUNTER — Telehealth: Payer: 59 | Admitting: Physician Assistant

## 2023-02-25 DIAGNOSIS — K047 Periapical abscess without sinus: Secondary | ICD-10-CM

## 2023-02-25 DIAGNOSIS — K0889 Other specified disorders of teeth and supporting structures: Secondary | ICD-10-CM

## 2023-02-25 MED ORDER — KETOROLAC TROMETHAMINE 30 MG/ML IJ SOLN
30.0000 mg | Freq: Once | INTRAMUSCULAR | Status: AC
Start: 2023-02-25 — End: 2023-02-25
  Administered 2023-02-25: 30 mg via INTRAMUSCULAR

## 2023-02-25 MED ORDER — TRAMADOL HCL 50 MG PO TABS: 50.0000 mg | ORAL_TABLET | Freq: Two times a day (BID) | ORAL | 0 refills | Status: AC | PRN

## 2023-02-25 NOTE — ED Triage Notes (Signed)
Pt states she is having pain in her left ear, jaw, left side of head and mouth that started yesterday. On 02/16/23 she had pain on right side of mouth and got prescribed clindamycin and the pain went away then started again yesterday with left sided pain. Taking ibuprofen with no relief of pain.

## 2023-02-25 NOTE — Progress Notes (Signed)
Because you are failing the 1st line treatment, we do highly recommend for you to be evaluated in person for appropriate treatment.  I recommend that you be seen in a face to face visit.   NOTE: There will be NO CHARGE for this eVisit   If you are having a true medical emergency please call 911.      For an urgent face to face visit, [facility] has eight urgent care centers for your convenience:   NEW!! [facility] Center at [facility] Get Driving Directions [phone] [address], [address] [place], [address]    [facility] Health Urgent Care Center at [facility] Get Driving Directions [phone] [address] [place], [place] [address]   [facility] Health Urgent Care Center [facility]) Get Driving Directions [phone] [address] [place], [place] [address]  [facility] Health Urgent Care Center [facility]) Get Driving Directions [phone] [address] [place],  [place]  [address]  [facility] Health Urgent Care Center [facility] - at [employer]  [phone] [address] [place],  [place] [address]   [facility] Health Urgent Care at [facility] Get Driving Directions [phone] [address] [place], [place] [address]   [facility] Health Urgent Care at [facility] Get Driving Directions  [phone] [address] [place], [place] [address]   [facility] Health Urgent Care at [facility] Directions [phone] [address], [address] [place], [place] [address]  Your MyChart E-visit questionnaire answers were reviewed by a board certified advanced clinical practitioner to complete your personal care plan based on your specific symptoms.  Thank you for using e-Visits.   I have spent 5 minutes in review of e-visit questionnaire, review and updating patient chart, medical decision making and response to patient.   Margaretann Loveless, PA-C

## 2023-02-25 NOTE — ED Provider Notes (Signed)
[facility]    CSN: [id number] Arrival date & time: 02/25/23  1902      History   Chief Complaint Chief Complaint  Patient presents with   Dental Problem    Was prescribed clindamycin last week for tooth pain it's not helping and my head is hurting so bad and the left side of my face is hurting it hurts to swallow alittle bit and they suggested I go to urgent care - Entered by patient    HPI Tiffany Hall is a 37 y.o. female.   The history is provided by the patient.   Patient presents for complaints of left ear pain and left facial pain.  Patient states symptoms started over the past 2 to 3 days.  Patient states that she did a e-visit last week, and was prescribed clindamycin.  Patient reports that she has 2 days of the clindamycin left.  Patient does report history of dental pain.  She states pain is located in the left upper portion of her mouth.  She denies facial swelling, ear drainage, fever, chills, chest pain, abdominal pain, nausea, vomiting, or diarrhea.  Patient reports she has been taking ibuprofen that was prescribed, but she is having minimal relief.  Patient reports that she is scheduled to see a dentist, but appointment is not until June.  Past Medical History:  Diagnosis Date   Anemia    Anxiety    Depression    GERD (gastroesophageal reflux disease)    medication not needed   Gestational diabetes    insulin   Heart murmur    History of abnormal cervical Pap smear    2009   History of cardiac murmur as a child    History of chlamydia    2008   History of gallstones    2012   History of gonorrhea    2008   History of ovarian cyst    History of trichomonal vaginitis    2008   HSV (herpes simplex virus) anogenital infection    Hypertension    PIH with pregnancy   Migraine headache    Preeclampsia in postpartum period 01/15/2015   Pregnancy induced hypertension    Sickle cell trait (HCC)    Vaginal Pap smear, abnormal    HPV +     Patient Active Problem List   Diagnosis Date Noted   Mild dysplasia of cervix (CIN I) 12/30/2022   History of bilateral tubal ligation 01/05/2021   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, antepartum, first trimester 09/09/2020   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   Migraine 02/25/2018    Past Surgical History:  Procedure Laterality Date   CESAREAN SECTION MULTI-GESTATIONAL N/A 01/05/2021   Procedure: CESAREAN SECTION MULTI-GESTATIONAL;  Surgeon: Catalina Antigua, MD;  Location: [facility]S;  Service: Obstetrics;  Laterality: N/A;   COLPOSCOPY     dilatation and currettage  N/A    DILATION AND CURETTAGE OF UTERUS  2009   W/  SUCTION --  required blood transfusion   DILATION AND EVACUATION N/A 09/14/2015   Procedure: DILATATION AND EVACUATION;  Surgeon: Mitchel Honour, DO;  Location: [facility] ORS;  Service: Gynecology;  Laterality: N/A;    OB History     Gravida  8   Para  6   Term  6   Preterm  0   AB  2   Living  7      SAB  2   IAB  0  Ectopic  0   Multiple  1   Live Births  5            Home Medications    Prior to Admission medications   Medication Sig Start Date End Date Taking? Authorizing Provider  clindamycin (CLEOCIN) 300 MG capsule Take 1 capsule (300 mg total) by mouth 3 (three) times daily. 02/18/23  Yes Margaretann Loveless, PA-C  ferrous sulfate 325 (65 FE) MG tablet Take 1 tablet (325 mg total) by mouth every other day. Patient taking differently: Take 325 mg by mouth daily. 01/08/21  Yes Muus, Raphael Gibney, MD  ibuprofen (ADVIL) 800 MG tablet Take 1 tablet (800 mg total) by mouth every 8 (eight) hours as needed. 02/18/23  Yes Margaretann Loveless, PA-C  JUNEL FE 1/20 1-20 MG-MCG tablet Take 1 tablet by mouth daily.   Yes [provider]  traMADol (ULTRAM) 50 MG tablet Take 1 tablet (50 mg total) by mouth every 12 (twelve) hours as needed for up to 5 days. 02/25/23 03/02/23 Yes Ariz Terrones-Warren, Sadie Haber, NP   Accu-Chek Softclix Lancets lancets Use as instructed Patient not taking: Reported on 01/12/2021 08/04/20   Brock Bad, MD  albuterol (VENTOLIN HFA) 108 (90 Base) MCG/ACT inhaler Inhale 2 puffs into the lungs every 6 (six) hours as needed for wheezing or shortness of breath. 12/21/22   Freddy Finner, NP  Blood Glucose Monitoring Suppl (ACCU-CHEK GUIDE) w/Device KIT 1 Device by Does not apply route 4 (four) times daily. Patient not taking: Reported on 01/12/2021 08/04/20   Brock Bad, MD  Blood Pressure Monitoring (BLOOD PRESSURE KIT) DEVI 1 kit by Does not apply route once a week. Check Blood Pressure regularly and record readings into the [employer] App.  Large Cuff.  DX O90.0 Patient not taking: Reported on 01/12/2021 07/06/20   Constant, Peggy, MD  dicyclomine (BENTYL) 10 MG capsule Take 1 capsule (10 mg total) by mouth 4 (four) times daily -  before meals and at bedtime. 01/24/23   Margaretann Loveless, PA-C  fluticasone (FLONASE) 50 MCG/ACT nasal spray Place 2 sprays into both nostrils daily. 12/21/22   Freddy Finner, NP  glucose blood (ACCU-CHEK GUIDE) test strip Use to check blood sugars four times a day was instructed Patient not taking: Reported on 01/12/2021 08/04/20   Brock Bad, MD    Family History Family History  Problem Relation Age of Onset   Breast cancer Mother    Diabetes Mother    Hypertension Mother    Hepatitis Mother    Cancer Mother 63       Breast, cervical   Breast cancer Maternal Grandmother    Diabetes Maternal Grandmother    Hypertension Maternal Grandmother    Cancer Maternal Grandmother 31       Breast   Anesthesia problems Neg Hx    Other Neg Hx     Social History Social History   Tobacco Use   Smoking status: Former    Packs/day: 0    Types: Cigarettes    Quit date: 11/05/2010    Years since quitting: 12.3   Smokeless tobacco: Never   Tobacco comments:    quit with preg  Vaping Use   Vaping Use: Never used  Substance Use  Topics   Alcohol use: No    Comment: occ   Drug use: No    Types: MDMA (Ecstacy)    Comment: prior to this pregnancy in 2009     Allergies  Amoxicillin and Nubain [nalbuphine hcl]   Review of Systems Review of Systems Per HPI  Physical Exam Triage Vital Signs ED Triage Vitals  Enc Vitals Group     BP      Pulse      Resp      Temp      Temp src      SpO2      Weight      Height      Head Circumference      Peak Flow      Pain Score      Pain Loc      Pain Edu?      Excl. in GC?    No data found.  Updated Vital Signs BP (!) 152/104 (BP Location: Right Arm)   Pulse 64   Temp 98.8 F (37.1 C) (Oral)   Resp 16   SpO2 98%   Visual Acuity Right Eye Distance:   Left Eye Distance:   Bilateral Distance:    Right Eye Near:   Left Eye Near:    Bilateral Near:     Physical Exam Vitals and nursing note reviewed.  Constitutional:      General: She is in acute distress (Appears to be in distress due to dental pain).     Appearance: Normal appearance.  HENT:     Head: Normocephalic.     Right Ear: Tympanic membrane, ear canal and external ear normal.     Left Ear: Tympanic membrane, ear canal and external ear normal.     Nose: Nose normal.     Mouth/Throat:     Mouth: Mucous membranes are moist.     Dentition: Abnormal dentition. Dental tenderness and dental caries present. No gingival swelling or dental abscesses.     Comments: Tooth #16 with open fracture.  No gingival swelling or dental abscess present. Eyes:     Extraocular Movements: Extraocular movements intact.     Conjunctiva/sclera: Conjunctivae normal.     Pupils: Pupils are equal, round, and reactive to light.  Cardiovascular:     Rate and Rhythm: Normal rate and regular rhythm.     Pulses: Normal pulses.     Heart sounds: Normal heart sounds.  Pulmonary:     Effort: Pulmonary effort is normal. No respiratory distress.     Breath sounds: Normal breath sounds. No stridor. No wheezing, rhonchi  or rales.  Abdominal:     General: Bowel sounds are normal.     Palpations: Abdomen is soft.     Tenderness: There is no abdominal tenderness.  Musculoskeletal:     Cervical back: Normal range of motion.  Lymphadenopathy:     Cervical: No cervical adenopathy.  Skin:    General: Skin is warm and dry.  Neurological:     General: No focal deficit present.     Mental Status: She is alert and oriented to person, place, and time.  Psychiatric:        Mood and Affect: Mood normal.        Behavior: Behavior normal.      UC Treatments / Results  Labs (all labs ordered are listed, but only abnormal results are displayed) Labs Reviewed - No data to display  EKG   Radiology No results found.  Procedures Procedures (including critical care time)  Medications Ordered in UC Medications  ketorolac (TORADOL) 30 MG/ML injection 30 mg (has no administration in time range)    Initial Impression / Assessment and Plan /  UC Course  I have reviewed the triage vital signs and the nursing notes.  Pertinent labs & imaging results that were available during my care of the patient were reviewed by me and considered in my medical decision making (see chart for details).  The patient appears to be in mild distress due to dental pain.  She is also hypertensive.  Toradol 30 mg IM was administered for dental pain.  Patient was advised to continue clindamycin that was previously prescribed.  Patient was prescribed tramadol 50 mg to take every 12 hours as needed for pain or discomfort.  Patient was given supportive care recommendations to include continued use of ibuprofen, recommend adding Tylenol to help with pain and discomfort, warm salt water gargles, and warm compresses.  Patient was given a Writer guide for the surrounding area to see if she can locate a dentist to see her within the next 7 to 10 days.  Patient was advised that if she develops facial swelling, fever, chills, or worsening  dental pain, she should go to the emergency department for further evaluation.  Patient is in agreement with this plan of care and verbalizes understanding.  All questions were answered.  Patient stable for discharge.  Final Clinical Impressions(s) / UC Diagnoses   Final diagnoses:  Dentalgia     Discharge Instructions         Take medication as prescribed. As discussed, when she would have completed the tramadol, he can begin taking ibuprofen again.  I recommend taking ibuprofen 800 mg every 8 hours.  You can take Tylenol arthritis strength 650 mg tablets 1 to 2 hours after the ibuprofen or with the ibuprofen for severe pain.  Make sure you are taking the ibuprofen with food and water to protect the stomach lining. Warm salt water gargles 3-4 times daily until symptoms improve. Continue warm compresses to the affected area to help with pain and discomfort. If you begin to experience severe facial swelling, fever, chills, or worsening dental pain, please go to the emergency department for further evaluation. I have provided a dental resource guide for you to follow-up with a dentist.  Recommend following up within the next 7 to 10 days or sooner if possible..      ED Prescriptions     Medication Sig Dispense Auth. Provider   traMADol (ULTRAM) 50 MG tablet Take 1 tablet (50 mg total) by mouth every 12 (twelve) hours as needed for up to 5 days. 10 tablet Dat Derksen-Warren, Sadie Haber, NP      I have reviewed the PDMP during this encounter.   Abran Cantor, NP 02/25/23 1947

## 2023-02-25 NOTE — Discharge Instructions (Addendum)
  Take medication as prescribed. As discussed, when she would have completed the tramadol, he can begin taking ibuprofen again.  I recommend taking ibuprofen 800 mg every 8 hours.  You can take Tylenol arthritis strength 650 mg tablets 1 to 2 hours after the ibuprofen or with the ibuprofen for severe pain.  Make sure you are taking the ibuprofen with food and water to protect the stomach lining. Warm salt water gargles 3-4 times daily until symptoms improve. Continue warm compresses to the affected area to help with pain and discomfort. If you begin to experience severe facial swelling, fever, chills, or worsening dental pain, please go to the emergency department for further evaluation. I have provided a dental resource guide for you to follow-up with a dentist.  Recommend following up within the next 7 to 10 days or sooner if possible.[name]

## 2023-03-18 ENCOUNTER — Telehealth: Payer: 59 | Admitting: Physician Assistant

## 2023-03-18 DIAGNOSIS — K047 Periapical abscess without sinus: Secondary | ICD-10-CM | POA: Diagnosis not present

## 2023-03-18 DIAGNOSIS — S025XXB Fracture of tooth (traumatic), initial encounter for open fracture: Secondary | ICD-10-CM | POA: Diagnosis not present

## 2023-03-18 MED ORDER — IBUPROFEN 800 MG PO TABS
800.0000 mg | ORAL_TABLET | Freq: Three times a day (TID) | ORAL | 0 refills | Status: AC | PRN
Start: 2023-03-18 — End: ?

## 2023-03-18 MED ORDER — CLINDAMYCIN HCL 300 MG PO CAPS
300.0000 mg | ORAL_CAPSULE | Freq: Three times a day (TID) | ORAL | 0 refills | Status: DC
Start: 2023-03-18 — End: 2023-09-21

## 2023-03-18 NOTE — Progress Notes (Signed)
Virtual Visit Consent   Tiffany Hall, you are scheduled for a virtual visit with a [facility] provider today. Just as with appointments in the office, your consent must be obtained to participate. Your consent will be active for this visit and any virtual visit you may have with one of our providers in the next 365 days. If you have a MyChart account, a copy of this consent can be sent to you electronically.  As this is a virtual visit, video technology does not allow for your provider to perform a traditional examination. This may limit your provider's ability to fully assess your condition. If your provider identifies any concerns that need to be evaluated in person or the need to arrange testing (such as labs, EKG, etc.), we will make arrangements to do so. Although advances in technology are sophisticated, we cannot ensure that it will always work on either your end or our end. If the connection with a video visit is poor, the visit may have to be switched to a telephone visit. With either a video or telephone visit, we are not always able to ensure that we have a secure connection.  By engaging in this virtual visit, you consent to the provision of healthcare and authorize for your insurance to be billed (if applicable) for the services provided during this visit. Depending on your insurance coverage, you may receive a charge related to this service.  I need to obtain your verbal consent now. Are you willing to proceed with your visit today? DENIAH SAIA has provided verbal consent on 03/18/2023 for a virtual visit (video or telephone). Margaretann Loveless, PA-C  Date: 03/18/2023 7:03 PM  Virtual Visit via Video Note   I, Margaretann Loveless, connected with  Tiffany Hall  ([id number], 11-Nov-1985) on 03/18/23 at  7:00 PM EDT by a video-enabled telemedicine application and verified that I am speaking with the correct person using two identifiers.  Location: Patient: Virtual Visit  Location Patient: Mobile Provider: Virtual Visit Location Provider: Home Office   I discussed the limitations of evaluation and management by telemedicine and the availability of in person appointments. The patient expressed understanding and agreed to proceed.    History of Present Illness: Tiffany Hall is a 37 y.o. who identifies as a female who was assigned female at birth, and is being seen today for recurrent dental infection.  HPI: Dental Pain  This is a recurrent problem. The current episode started in the past 7 days. The problem occurs constantly. The problem has been gradually worsening. The pain is at a severity of 8/10. The pain is severe. Associated symptoms include facial pain, sinus pressure and thermal sensitivity. Pertinent negatives include no difficulty swallowing, fever or oral bleeding. Associated symptoms comments: Headache. She has tried acetaminophen and NSAIDs for the symptoms. The treatment provided no relief.     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 No  Did it involve sudden or severe rash/hives, skin peeling, or any reaction on the inside of your mouth or  nose? No  Did you need to seek medical attention at a hospital or doctor's office? No  When did it last happen?      last year  If all above answers are "NO", may proceed with cephalosporin use.  After dental work. Pt unsure if allergy or bad swelling related to surgery and abscess. Pt reports having penicillin and possibly amoxicillin afterwards.    Nubain [Nalbuphine Hcl] Swelling, Palpitations and Other (See Comments)    Pt states that she is able to take Percocet without any problems.      Medications:  Current Outpatient Medications:    clindamycin (CLEOCIN) 300 MG capsule, Take 1 capsule (300 mg total) by mouth 3 (three) times daily., Disp: 15 capsule, Rfl: 0   ibuprofen (ADVIL) 800 MG tablet, Take 1 tablet (800 mg total) by mouth every 8 (eight) hours as needed., Disp: 30 tablet, Rfl: 0   Accu-Chek Softclix Lancets lancets, Use as instructed (Patient not taking: Reported on 01/12/2021), Disp: 100 each, Rfl: 12   albuterol (VENTOLIN HFA) 108 (90 Base) MCG/ACT inhaler, Inhale 2 puffs into the lungs every 6 (six) hours as needed for wheezing or shortness of breath., Disp: 8 g, Rfl: 0   Blood Glucose Monitoring Suppl (ACCU-CHEK GUIDE) w/Device KIT, 1 Device by Does not apply route 4 (four) times daily. (Patient not taking: Reported on 01/12/2021), Disp: 1 kit, Rfl: 0   Blood Pressure Monitoring (BLOOD PRESSURE KIT) DEVI, 1 kit by Does not apply route once a week. Check Blood Pressure regularly and record readings into the [employer] App.  Large Cuff.  DX O90.0 (Patient not taking: Reported on 01/12/2021), Disp: 1 each, Rfl: 0   dicyclomine (BENTYL) 10 MG capsule, Take 1 capsule (10 mg total) by mouth 4 (four) times daily -  before meals and at bedtime., Disp: 21 capsule, Rfl: 0   ferrous sulfate 325 (65 FE) MG tablet, Take 1 tablet (325 mg total) by mouth every other day. (Patient taking differently: Take 325 mg by mouth daily.), Disp: 45 tablet, Rfl: 3   fluticasone (FLONASE) 50 MCG/ACT nasal spray, Place 2 sprays into both nostrils daily., Disp: 16 g, Rfl: 0   glucose blood (ACCU-CHEK GUIDE) test strip, Use to check blood sugars four times a day was instructed (Patient not taking: Reported on 01/12/2021), Disp: 50 each, Rfl: 12   JUNEL FE 1/20 1-20 MG-MCG tablet, Take 1 tablet by mouth daily., Disp: , Rfl:   Observations/Objective: Patient is  well-developed, well-nourished in no acute distress.  Resting comfortably  Head is normocephalic, atraumatic.  No labored breathing.  Speech is clear and coherent with logical content.  Patient is alert and oriented at baseline.    Assessment and Plan: 1. Dental infection - clindamycin (CLEOCIN) 300 MG capsule; Take 1 capsule (300 mg total) by mouth 3 (three) times daily.  Dispense: 15 capsule; Refill: 0  2. Open fracture of tooth, initial encounter - ibuprofen (ADVIL) 800 MG tablet; Take 1 tablet (800 mg total) by mouth every 8 (eight) hours as needed.  Dispense: 30 tablet; Refill: 0  - Suspected infection with broken tooth - Clindamycin and ibuprofen prescribed - Can use ice on outside jaw/cheek for swelling - Can also take tylenol for pain with other medications - Discussed DenTemp putty that can be used to cover a broken tooth - Keep scheduled follow up with dentist on Wednesday - Seek in person evaluation if symptoms fail to improve or if they worsen   Follow Up Instructions: I discussed  the assessment and treatment plan with the patient. The patient was provided an opportunity to ask questions and all were answered. The patient agreed with the plan and demonstrated an understanding of the instructions.  A copy of instructions were sent to the patient via MyChart unless otherwise noted below.    The patient was advised to call back or seek an in-person evaluation if the symptoms worsen or if the condition fails to improve as anticipated.  Time:  I spent 10 minutes with the patient via telehealth technology discussing the above problems/concerns.    Mar Daring, PA-C

## 2023-03-18 NOTE — Patient Instructions (Signed)
Tiffany Hall, thank you for joining Tiffany Loveless, PA-C for today's virtual visit.  While this provider is not your primary care provider (PCP), if your PCP is located in our provider database this encounter information will be shared with them immediately following your visit.   A  MyChart account gives you access to today's visit and all your visits, tests, and labs performed at [facility] " click here if you don't have a  MyChart account or go to [email or web address]  Consent: (Patient) Tiffany Hall provided verbal consent for this virtual visit at the beginning of the encounter.  Current Medications:  Current Outpatient Medications:    clindamycin (CLEOCIN) 300 MG capsule, Take 1 capsule (300 mg total) by mouth 3 (three) times daily., Disp: 15 capsule, Rfl: 0   ibuprofen (ADVIL) 800 MG tablet, Take 1 tablet (800 mg total) by mouth every 8 (eight) hours as needed., Disp: 30 tablet, Rfl: 0   Accu-Chek Softclix Lancets lancets, Use as instructed (Patient not taking: Reported on 01/12/2021), Disp: 100 each, Rfl: 12   albuterol (VENTOLIN HFA) 108 (90 Base) MCG/ACT inhaler, Inhale 2 puffs into the lungs every 6 (six) hours as needed for wheezing or shortness of breath., Disp: 8 g, Rfl: 0   Blood Glucose Monitoring Suppl (ACCU-CHEK GUIDE) w/Device KIT, 1 Device by Does not apply route 4 (four) times daily. (Patient not taking: Reported on 01/12/2021), Disp: 1 kit, Rfl: 0   Blood Pressure Monitoring (BLOOD PRESSURE KIT) DEVI, 1 kit by Does not apply route once a week. Check Blood Pressure regularly and record readings into the [employer] App.  Large Cuff.  DX O90.0 (Patient not taking: Reported on 01/12/2021), Disp: 1 each, Rfl: 0   dicyclomine (BENTYL) 10 MG capsule, Take 1 capsule (10 mg total) by mouth 4 (four) times daily -  before meals and at bedtime., Disp: 21 capsule, Rfl: 0   ferrous sulfate 325 (65 FE) MG tablet, Take 1 tablet (325 mg total)  by mouth every other day. (Patient taking differently: Take 325 mg by mouth daily.), Disp: 45 tablet, Rfl: 3   fluticasone (FLONASE) 50 MCG/ACT nasal spray, Place 2 sprays into both nostrils daily., Disp: 16 g, Rfl: 0   glucose blood (ACCU-CHEK GUIDE) test strip, Use to check blood sugars four times a day was instructed (Patient not taking: Reported on 01/12/2021), Disp: 50 each, Rfl: 12   JUNEL FE 1/20 1-20 MG-MCG tablet, Take 1 tablet by mouth daily., Disp: , Rfl:    Medications ordered in this encounter:  Meds ordered this encounter  Medications   clindamycin (CLEOCIN) 300 MG capsule    Sig: Take 1 capsule (300 mg total) by mouth 3 (three) times daily.    Dispense:  15 capsule    Refill:  0    Order Specific Question:   Supervising Provider    Answer:   Tiffany Hall [id number]   ibuprofen (ADVIL) 800 MG tablet    Sig: Take 1 tablet (800 mg total) by mouth every 8 (eight) hours as needed.    Dispense:  30 tablet    Refill:  0    Order Specific Question:   Supervising Provider    Answer:   Tiffany Hall [id number]     *If you need refills on other medications prior to your next appointment, please contact your pharmacy*  Follow-Up: Call back or seek an in-person evaluation if the symptoms worsen or if the condition fails to improve as anticipated.  [facility] [phone]  Other Instructions Dental Abscess  A dental abscess is an infection around a tooth that may involve pain, swelling, and a collection of pus, as well as other symptoms. Treatment is important to help with symptoms and to prevent the infection from spreading. The general types of dental abscesses are: Pulpal abscess. This abscess may form from the inner part of the tooth (pulp). Periodontal abscess. This abscess may form from the gum. What are the causes? This condition is caused by a bacterial infection in or around the tooth. It may result from: Severe tooth decay (cavities). Trauma to  the tooth, such as a broken or chipped tooth. What increases the risk? This condition is more likely to develop in males. It is also more likely to develop in people who: Have cavities. Have severe gum disease. Eat sugary snacks between meals. Use tobacco products. Have diabetes. Have a weakened disease-fighting system (immune system). Do not brush and care for their teeth regularly. What are the signs or symptoms? Mild symptoms of this condition include: Tenderness. Bad breath. Fever. A bitter taste in the mouth. Pain in and around the infected tooth. Moderate symptoms of this condition include: Swollen neck glands. Chills. Pus drainage. Swelling and redness around the infected tooth, in the mouth, or in the face. Severe pain in and around the infected tooth. Severe symptoms of this condition include: Difficulty swallowing. Difficulty opening the mouth. Nausea. Vomiting. How is this diagnosed? This condition is diagnosed based on: Your symptoms and your medical and dental history. An examination of the infected tooth. During the exam, your dental care provider may tap on the infected tooth. You may also need to have X-rays taken of the affected area. How is this treated? This condition is treated by getting rid of the infection. This may be done with: Antibiotic medicines. These may be used in certain situations. Antibacterial mouth rinse. Incision and drainage. This procedure is done by making an incision in the abscess to drain out the pus. Removing pus is the first priority in treating an abscess. A root canal. This may be performed to save the tooth. Your dental care provider accesses the visible part of your tooth (crown) with a drill and removes any infected pulp. Then the space is filled and sealed off. Tooth extraction. The tooth is pulled out if it cannot be saved by other treatment. You may also receive treatment for pain, such as: Acetaminophen or NSAIDs. Gels  that contain a numbing medicine. An injection to block the pain near your nerve. Follow these instructions at home: Medicines Take over-the-counter and prescription medicines only as told by your dental care provider. If you were prescribed an antibiotic, take it as told by your dental care provider. Do not stop taking the antibiotic even if you start to feel better. If you were prescribed a gel that contains a numbing medicine, use it exactly as told in the directions. Do not use these gels for children who are younger than 53 years of age. Use an antibacterial mouth rinse as told by your dental care provider. General instructions  Gargle with a mixture of salt and water 3-4 times a day or as needed. To make salt water, completely dissolve -1 tsp (3-6 g) of salt in 1 cup (237 mL) of warm water. Eat a soft diet while your abscess is healing. Drink enough fluid to keep your urine pale yellow. Do not apply heat to the outside of your mouth. Do  not use any products that contain nicotine or tobacco. These products include cigarettes, chewing tobacco, and vaping devices, such as e-cigarettes. If you need help quitting, ask your dental care provider. Keep all follow-up visits. This is important. How is this prevented?  Excellent dental home care, which includes brushing your teeth every morning and night with fluoride toothpaste. Floss one time each day. Get regularly scheduled dental cleanings. Consider having a dental sealant applied on teeth that have deep grooves to prevent cavities. Drink fluoridated water regularly. This includes most tap water. Check the label on bottled water to see if it contains fluoride. Reduce or eliminate sugary drinks. Eat healthy meals and snacks. Wear a mouth guard or face shield to protect your teeth while playing sports. Contact a health care provider if: Your pain is worse and is not helped by medicine. You have swelling. You see pus around the tooth. You  have a fever or chills. Get help right away if: Your symptoms suddenly get worse. You have a very bad headache. You have problems breathing or swallowing. You have trouble opening your mouth. You have swelling in your neck or around your eye. These symptoms may represent a serious problem that is an emergency. Do not wait to see if the symptoms will go away. Get medical help right away. Call your local emergency services (911 in the [place] Do not drive yourself to the hospital. Summary A dental abscess is a collection of pus in or around a tooth that results from an infection. A dental abscess may result from severe tooth decay, trauma to the tooth, or severe gum disease around a tooth. Symptoms include severe pain, swelling, redness, and drainage of pus in and around the infected tooth. The first priority in treating a dental abscess is to drain out the pus. Treatment may also involve removing damage inside the tooth (root canal) or extracting the tooth. This information is not intended to replace advice given to you by your health care provider. Make sure you discuss any questions you have with your health care provider. Document Revised: 12/01/2020 Document Reviewed: 12/01/2020 Elsevier Patient Education  2024 Elsevier Inc.    If you have been instructed to have an in-person evaluation today at a local Urgent Care facility, please use the link below. It will take you to a list of all of our available [facility], including address, phone number and hours of operation. Please do not delay care.  [facility]  If you or a family member do not have a primary care provider, use the link below to schedule a visit and establish care. When you choose a [facility] primary care physician or advanced practice provider, you gain a long-term partner in health. Find a Primary Care Provider  Learn more about [facility]'s in-office and virtual care options: [facility] - Get  Care Now

## 2023-04-16 ENCOUNTER — Telehealth: Payer: 59 | Admitting: Physician Assistant

## 2023-04-16 DIAGNOSIS — A084 Viral intestinal infection, unspecified: Secondary | ICD-10-CM

## 2023-04-16 MED ORDER — SUCRALFATE 1 G PO TABS
1.0000 g | ORAL_TABLET | Freq: Three times a day (TID) | ORAL | 0 refills | Status: AC
Start: 2023-04-16 — End: 2023-04-23

## 2023-04-16 MED ORDER — ONDANSETRON HCL 4 MG PO TABS: 4.0000 mg | ORAL_TABLET | Freq: Three times a day (TID) | ORAL | 0 refills | Status: AC | PRN

## 2023-04-16 NOTE — Progress Notes (Signed)
E-Visit for Nausea and Vomiting   We are sorry that you are not feeling well. Here is how we plan to help!  Based on what you have shared with me it looks like you have a Virus that is irritating your GI tract.  Vomiting is the forceful emptying of a portion of the stomach's content through the mouth.  Although nausea and vomiting can make you feel miserable, it's important to remember that these are not diseases, but rather symptoms of an underlying illness.  When we treat short term symptoms, we always caution that any symptoms that persist should be fully evaluated in a medical office.  I have prescribed a medication that will help alleviate your symptoms and allow you to stay hydrated:  Sucralfate 1G Take 1 tablet before each meal and at bedtime   HOME CARE: Drink clear liquids.  This is very important! Dehydration (the lack of fluid) can lead to a serious complication.  Start off with 1 tablespoon every 5 minutes for 8 hours. You may begin eating bland foods after 8 hours without vomiting.  Start with saltine crackers, white bread, rice, mashed potatoes, applesauce. After 48 hours on a bland diet, you may resume a normal diet. Try to go to sleep.  Sleep often empties the stomach and relieves the need to vomit.  GET HELP RIGHT AWAY IF:  Your symptoms do not improve or worsen within 2 days after treatment. You have a fever for over 3 days. You cannot keep down fluids after trying the medication.  MAKE SURE YOU:  Understand these instructions. Will watch your condition. Will get help right away if you are not doing well or get worse.    Thank you for choosing an e-visit.  Your e-visit answers were reviewed by a board certified advanced clinical practitioner to complete your personal care plan. Depending upon the condition, your plan could have included both over the counter or prescription medications.  Please review your pharmacy choice. Make sure the pharmacy is open so you can  pick up prescription now. If there is a problem, you may contact your provider through [employer] and have the prescription routed to another pharmacy.  Your safety is important to [place]. If you have drug allergies check your prescription carefully.   For the next 24 hours you can use MyChart to ask questions about today's visit, request a non-urgent call back, or ask for a work or school excuse. You will get an email in the next two days asking about your experience. I hope that your e-visit has been valuable and will speed your recovery.  I have spent 5 minutes in review of e-visit questionnaire, review and updating patient chart, medical decision making and response to patient.   Margaretann Loveless, PA-C

## 2023-04-16 NOTE — Addendum Note (Signed)
Addended by: Margaretann Loveless on: 04/16/2023 05:55 PM   Modules accepted: Orders

## 2023-05-18 ENCOUNTER — Telehealth: Payer: 59 | Admitting: Family Medicine

## 2023-05-18 DIAGNOSIS — J069 Acute upper respiratory infection, unspecified: Secondary | ICD-10-CM | POA: Diagnosis not present

## 2023-05-18 MED ORDER — FLUTICASONE PROPIONATE 50 MCG/ACT NA SUSP
2.0000 | Freq: Every day | NASAL | 0 refills | Status: AC
Start: 2023-05-18 — End: ?

## 2023-05-18 MED ORDER — BENZONATATE 200 MG PO CAPS: 200.0000 mg | ORAL_CAPSULE | Freq: Two times a day (BID) | ORAL | 0 refills | Status: AC | PRN

## 2023-05-18 NOTE — Progress Notes (Signed)

## 2023-06-26 ENCOUNTER — Telehealth: Payer: 59 | Admitting: Physician Assistant

## 2023-06-26 DIAGNOSIS — B3731 Acute candidiasis of vulva and vagina: Secondary | ICD-10-CM | POA: Diagnosis not present

## 2023-06-26 MED ORDER — FLUCONAZOLE 150 MG PO TABS
150.0000 mg | ORAL_TABLET | Freq: Every day | ORAL | 0 refills | Status: DC
Start: 2023-06-26 — End: 2024-08-01

## 2023-06-26 NOTE — Progress Notes (Signed)
I have spent 5 minutes in review of e-visit questionnaire, review and updating patient chart, medical decision making and response to patient.   Mia Milan Cody Jacklynn Dehaas, PA-C    

## 2023-06-26 NOTE — Progress Notes (Signed)

## 2023-07-20 ENCOUNTER — Telehealth: Payer: 59 | Admitting: Family Medicine

## 2023-07-20 DIAGNOSIS — B839 Helminthiasis, unspecified: Secondary | ICD-10-CM

## 2023-07-20 MED ORDER — ALBENDAZOLE 200 MG PO TABS
200.0000 mg | ORAL_TABLET | Freq: Two times a day (BID) | ORAL | 0 refills | Status: AC
Start: 2023-07-20 — End: 2023-07-21

## 2023-07-20 NOTE — Patient Instructions (Signed)
[name], thank you for joining Reed Pandy, PA-C for today's virtual visit.  While this provider is not your primary care provider (PCP), if your PCP is located in our provider database this encounter information will be shared with them immediately following your visit.   A [facility] MyChart account gives you access to today's visit and all your visits, tests, and labs performed at [facility] " click here if you don't have a [facility] MyChart account or go to [email or web address]  Consent: (Patient) MORRISSA SHEIN provided verbal consent for this virtual visit at the beginning of the encounter.  Current Medications:  Current Outpatient Medications:    albendazole (ALBENZA) 200 MG tablet, Take 1 tablet (200 mg total) by mouth 2 (two) times daily for 1 day., Disp: 2 tablet, Rfl: 0   Accu-Chek Softclix Lancets lancets, Use as instructed (Patient not taking: Reported on 01/12/2021), Disp: 100 each, Rfl: 12   albuterol (VENTOLIN HFA) 108 (90 Base) MCG/ACT inhaler, Inhale 2 puffs into the lungs every 6 (six) hours as needed for wheezing or shortness of breath., Disp: 8 g, Rfl: 0   benzonatate (TESSALON) 200 MG capsule, Take 1 capsule (200 mg total) by mouth 2 (two) times daily as needed for cough., Disp: 20 capsule, Rfl: 0   Blood Glucose Monitoring Suppl (ACCU-CHEK GUIDE) w/Device KIT, 1 Device by Does not apply route 4 (four) times daily. (Patient not taking: Reported on 01/12/2021), Disp: 1 kit, Rfl: 0   Blood Pressure Monitoring (BLOOD PRESSURE KIT) DEVI, 1 kit by Does not apply route once a week. Check Blood Pressure regularly and record readings into the Babyscripts App.  Large Cuff.  DX O90.0 (Patient not taking: Reported on 01/12/2021), Disp: 1 each, Rfl: 0   clindamycin (CLEOCIN) 300 MG capsule, Take 1 capsule (300 mg total) by mouth 3 (three) times daily., Disp: 15 capsule, Rfl: 0   dicyclomine (BENTYL) 10 MG capsule, Take 1 capsule (10 mg total) by mouth 4 (four)  times daily -  before meals and at bedtime., Disp: 21 capsule, Rfl: 0   ferrous sulfate 325 (65 FE) MG tablet, Take 1 tablet (325 mg total) by mouth every other day. (Patient taking differently: Take 325 mg by mouth daily.), Disp: 45 tablet, Rfl: 3   fluconazole (DIFLUCAN) 150 MG tablet, Take 1 tablet (150 mg total) by mouth daily., Disp: 1 tablet, Rfl: 0   fluticasone (FLONASE) 50 MCG/ACT nasal spray, Place 2 sprays into both nostrils daily., Disp: 16 g, Rfl: 0   glucose blood (ACCU-CHEK GUIDE) test strip, Use to check blood sugars four times a day was instructed (Patient not taking: Reported on 01/12/2021), Disp: 50 each, Rfl: 12   ibuprofen (ADVIL) 800 MG tablet, Take 1 tablet (800 mg total) by mouth every 8 (eight) hours as needed., Disp: 30 tablet, Rfl: 0   JUNEL FE 1/20 1-20 MG-MCG tablet, Take 1 tablet by mouth daily., Disp: , Rfl:    ondansetron (ZOFRAN) 4 MG tablet, Take 1 tablet (4 mg total) by mouth every 8 (eight) hours as needed for nausea or vomiting., Disp: 20 tablet, Rfl: 0   sucralfate (CARAFATE) 1 g tablet, Take 1 tablet (1 g total) by mouth 4 (four) times daily -  with meals and at bedtime for 7 days., Disp: 28 tablet, Rfl: 0   Medications ordered in this encounter:  Meds ordered this encounter  Medications   albendazole (ALBENZA) 200 MG tablet    Sig: Take 1 tablet (200 mg total)  by mouth 2 (two) times daily for 1 day.    Dispense:  2 tablet    Refill:  0     *If you need refills on other medications prior to your next appointment, please contact your pharmacy*  Follow-Up: Call back or seek an in-person evaluation if the symptoms worsen or if the condition fails to improve as anticipated.  [facility] [phone]  Other Instructions Tapeworm Infection Tapeworms are parasites that can live in a person's intestines. When eggs from a tapeworm are consumed, they grow into a young tapeworm (larva) and then an adult tapeworm inside a person. An adult tapeworm  can grow very long. It can live inside a human body for many years. There are different types of tapeworms. Most infections cause only mild symptoms and are limited to the intestines. One type of tapeworm, called a pork tapeworm (Taenia solium or T. solium), can cause a more serious infection (cysticercosis). In this type of infection, tapeworm larvae can spread through the body and form fluid-filled sacs (cysts) in areas such as the eyes, skin, muscle tissue, heart, and brain. What are the causes? This condition is caused by: Eating food that is contaminated with tapeworm eggs. Drinking water that has tapeworm eggs in it (has been contaminated). What increases the risk? You are more likely to develop this condition if: You live in or travel to places where livestock and other animals roam freely, such as rural areas. You work with animals or are exposed to animals. You live with someone who has a tapeworm infection. You eat food that has been prepared by someone with a tapeworm infection. You eat beef, pork, or fish that is raw or has not been cooked well enough. What are the signs or symptoms? In some cases, there are no symptoms. If you have symptoms, they may include: Pain in the abdomen. Nausea. Loss of appetite. Weight loss. Worm segments in your stool (feces). Diarrhea. Symptoms of cysticercosis may vary based on where the cysts form in your body. Symptoms may include: Feeling lumps under your skin. Headache. Seizure. Confusion. Trouble seeing (vision loss) or eye pain. Muscle weakness. Balance problems. How is this diagnosed? This condition may be diagnosed based on: Your symptoms. Your travel history. Your diet history. Blood tests. Feces tests. Tests of the fluid that surrounds and protects the brain and spinal cord (cerebrospinal fluid). The fluid is removed with a needle during a spinal tap (lumbar puncture). Imaging tests, such as a CT scan or MRI. How is this  treated? Treatment for this condition depends on the type of tapeworm and your symptoms. Most often, this condition is treated with antiparasitic medicine to kill the tapeworms. Treatment for cysticercosis depends on the number of cysts and the symptoms you have. Treatment may include: Medicines to: Kill tapeworm larvae or eggs. Decrease swelling (steroids). Prevent seizures (anti-epileptics). Surgery to remove cysts. Follow these instructions at home:  Take over-the-counter and prescription medicines only as told by your health care provider. If you were prescribed an antiparasitic medicine, take it as told by your health care provider. Do not stop taking the medicine even if you start to feel better. Wash your hands often with soap and water for at least 20 seconds. Keep all follow-up visits. You may need to have repeat tests to make sure that the treatment worked. How is this prevented? Do not eat raw or undercooked fish or meat. Cook fish and meat according to food safety guidelines. Use a  meat thermometer to make sure that fish and meat are cooked well enough. Wash your hands with soap and water: After you use the toilet. Before you handle or prepare food. After you handle or prepare food. Freeze meat for 12 hours before cooking it to help prevent tapeworm infection. Only eat raw fish that has been previously frozen. This includes sushi. Before you eat raw fruits and vegetables: Wash them in boiled, bottled, or treated water. Peel them. When traveling in rural areas: Make sure you only drink water that is bottled or treated. Do not eat or drink anything that may be contaminated, including drinks with ice cubes that may have been made from unboiled or untreated water. Do not eat raw foods that have been washed in unboiled tap water. It is safe to drink bottled or canned beverages, such as carbonated drinks, teas, pasteurized fruit drinks, or steaming hot beverages. Contact a health  care provider if: You still have symptoms after your treatment is complete. You develop any new symptoms. Get help right away if: You feel light-headed or you faint. You have a seizure. You have sudden vision loss. You become confused. These symptoms may be an emergency. Get help right away. Call 911. Do not wait to see if the symptoms will go away. Do not drive yourself to the hospital. Summary Tapeworms are parasites that can live in a person's intestines. Tapeworms can live in a human body for many years. Tapeworms develop from tapeworm eggs that are consumed. Most infections cause only mild symptoms and are limited to the intestines. One type of tapeworm can cause a more serious infection (cysticercosis) where the larvae spread through the body and form cysts. Most often, this condition is treated with antiparasitic medicine to kill the tapeworms. This information is not intended to replace advice given to you by your health care provider. Make sure you discuss any questions you have with your health care provider. Document Revised: 01/16/2022 Document Reviewed: 01/16/2022 Elsevier Patient Education  2024 Elsevier Inc.    If you have been instructed to have an in-person evaluation today at a local Urgent Care facility, please use the link below. It will take you to a list of all of our available [facility], including address, phone number and hours of operation. Please do not delay care.  [facility]  If you or a family member do not have a primary care provider, use the link below to schedule a visit and establish care. When you choose a [facility] primary care physician or advanced practice provider, you gain a long-term partner in health. Find a Primary Care Provider  Learn more about [facility]'s in-office and virtual care options:  - Get Care Now

## 2023-07-20 NOTE — Progress Notes (Signed)
Virtual Visit Consent   [name], you are scheduled for a virtual visit with a [facility] provider today. Just as with appointments in the office, your consent must be obtained to participate. Your consent will be active for this visit and any virtual visit you may have with one of our providers in the next 365 days. If you have a MyChart account, a copy of this consent can be sent to you electronically.  As this is a virtual visit, video technology does not allow for your provider to perform a traditional examination. This may limit your provider's ability to fully assess your condition. If your provider identifies any concerns that need to be evaluated in person or the need to arrange testing (such as labs, EKG, etc.), we will make arrangements to do so. Although advances in technology are sophisticated, we cannot ensure that it will always work on either your end or our end. If the connection with a video visit is poor, the visit may have to be switched to a telephone visit. With either a video or telephone visit, we are not always able to ensure that we have a secure connection.  By engaging in this virtual visit, you consent to the provision of healthcare and authorize for your insurance to be billed (if applicable) for the services provided during this visit. Depending on your insurance coverage, you may receive a charge related to this service.  I need to obtain your verbal consent now. Are you willing to proceed with your visit today? [name] has provided verbal consent on 07/20/2023 for a virtual visit (video or telephone). Tiffany Hall, [place]  Date: 07/20/2023 10:33 AM  Virtual Visit via Video Note   I, Tiffany Hall, connected with  Tiffany Hall  ([id number], [birth year]) on 07/20/23 at 10:45 AM EDT by a video-enabled telemedicine application and verified that I am speaking with the correct person using two identifiers.  Location: Patient: Virtual Visit Location  Patient: In her parked car Provider: Virtual Visit Location Provider: Home Office   I discussed the limitations of evaluation and management by telemedicine and the availability of in person appointments. The patient expressed understanding and agreed to proceed.    History of Present Illness: Tiffany Hall is a 37 y.o. who identifies as a female who was assigned female at birth, and is being seen today for c/o I have worms that we caught from her puppy dog.  Pt states she was prescribed Albendazole one dose last year and that was helpful.  Pt states her sons also have it but she is taking them to be seen in person.  Pt states last time she made a virtual visit and was able to be prescribed the medication. Pt states she was nauseaous earlier this week but none today.  Pt denies fever, chills, diarrhea or blood in her stools and also denies pregnancy.   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 No  Did it involve sudden or severe rash/hives, skin peeling, or any reaction  on the inside of your mouth or nose? No  Did you need to seek medical attention at a hospital or doctor's office? No  When did it last happen?      last year  If all above answers are "NO", may proceed with cephalosporin use.  After dental work. Pt unsure if allergy or bad swelling related to surgery and abscess. Pt reports having penicillin and possibly amoxicillin afterwards.   Nubain [Nalbuphine Hcl] Swelling, Palpitations and Other (See Comments)    Pt states  that she is able to take Percocet without any problems.      Medications:  Current Outpatient Medications:    albendazole (ALBENZA) 200 MG tablet, Take 1 tablet (200 mg total) by mouth 2 (two) times daily for 1 day., Disp: 2 tablet, Rfl: 0   Accu-Chek Softclix Lancets lancets, Use as instructed (Patient not taking: Reported on 01/12/2021), Disp: 100 each, Rfl: 12   albuterol (VENTOLIN HFA) 108 (90 Base) MCG/ACT inhaler, Inhale 2 puffs into the lungs every 6 (six) hours as needed for wheezing or shortness of breath., Disp: 8 g, Rfl: 0   benzonatate (TESSALON) 200 MG capsule, Take 1 capsule (200 mg total) by mouth 2 (two) times daily as needed for cough., Disp: 20 capsule, Rfl: 0   Blood Glucose Monitoring Suppl (ACCU-CHEK GUIDE) w/Device KIT, 1 Device by Does not apply route 4 (four) times daily. (Patient not taking: Reported on 01/12/2021), Disp: 1 kit, Rfl: 0   Blood Pressure Monitoring (BLOOD PRESSURE KIT) DEVI, 1 kit by Does not apply route once a week. Check Blood Pressure regularly and record readings into the Babyscripts App.  Large Cuff.  DX O90.0 (Patient not taking: Reported on 01/12/2021), Disp: 1 each, Rfl: 0   clindamycin (CLEOCIN) 300 MG capsule, Take 1 capsule (300 mg total) by mouth 3 (three) times daily., Disp: 15 capsule, Rfl: 0   dicyclomine (BENTYL) 10 MG capsule, Take 1 capsule (10 mg total) by mouth 4 (four) times daily -  before meals and at bedtime., Disp: 21 capsule, Rfl: 0   ferrous sulfate 325 (65 FE) MG tablet, Take 1 tablet (325 mg total) by mouth every other day. (Patient taking differently: Take 325 mg by mouth daily.), Disp: 45 tablet, Rfl: 3   fluconazole (DIFLUCAN) 150 MG tablet, Take 1 tablet (150 mg total) by mouth daily., Disp: 1 tablet, Rfl: 0   fluticasone (FLONASE) 50 MCG/ACT nasal spray, Place 2 sprays into both nostrils daily., Disp: 16 g, Rfl: 0   glucose blood (ACCU-CHEK GUIDE) test strip, Use to check blood sugars four times a day was instructed (Patient not  taking: Reported on 01/12/2021), Disp: 50 each, Rfl: 12   ibuprofen (ADVIL) 800 MG tablet, Take 1 tablet (800 mg total) by mouth every 8 (eight) hours as needed., Disp: 30 tablet, Rfl: 0   JUNEL FE 1/20 1-20 MG-MCG tablet, Take 1 tablet by mouth daily., Disp: , Rfl:    ondansetron (ZOFRAN) 4 MG tablet, Take 1 tablet (4 mg total) by mouth every 8 (eight) hours as needed for nausea or vomiting., Disp: 20 tablet, Rfl: 0   sucralfate (CARAFATE) 1 g tablet, Take 1 tablet (1 g total) by mouth 4 (four) times daily -  with meals and at bedtime for 7 days., Disp: 28 tablet, Rfl: 0  Observations/Objective: Patient is well-developed, well-nourished in no acute distress.  Resting comfortably Head is normocephalic, atraumatic.  No labored breathing.  Speech is clear and coherent with logical content.  Patient is  alert and oriented at baseline.    Assessment and Plan: 1. Worms in stool - albendazole (ALBENZA) 200 MG tablet; Take 1 tablet (200 mg total) by mouth 2 (two) times daily for 1 day.  Dispense: 2 tablet; Refill: 0  -One dose of medication sent. -Advised patient to follow up with PCP -Pt verbalized understanding.   Follow Up Instructions: I discussed the assessment and treatment plan with the patient. The patient was provided an opportunity to ask questions and all were answered. The patient agreed with the plan and demonstrated an understanding of the instructions.  A copy of instructions were sent to the patient via MyChart unless otherwise noted below.     The patient was advised to call back or seek an in-person evaluation if the symptoms worsen or if the condition fails to improve as anticipated.    Tiffany Pandy, PA-C

## 2023-08-25 ENCOUNTER — Telehealth: Payer: 59 | Admitting: Family

## 2023-08-25 DIAGNOSIS — N898 Other specified noninflammatory disorders of vagina: Secondary | ICD-10-CM

## 2023-08-25 NOTE — Progress Notes (Signed)
Because you have had recurrent vaginal discharge, I feel your condition warrants further evaluation and I recommend that you be seen in a face to face visit.   NOTE: There will be NO CHARGE for this eVisit   If you are having a true medical emergency please call 911.      For an urgent face to face visit, [facility] has eight urgent care centers for your convenience:   NEW!! [facility] Center at [facility] Get Driving Directions [phone] [address] [place], [address]    [facility] Center at [facility] Get Driving Directions [phone] [address] [place], [place] [address]   [facility] Center [facility]) Get Driving Directions [phone] [address] [place], [place] [address]  [facility] Health Urgent Care Center [facility]) Get Driving Directions [phone] [address] [place],  [place]  [address]  [facility] Center [facility] at [employer] [facility]  [phone] [place],  [place] [address]   [facility] Care at [facility] Get Driving Directions [phone] [place], [place] [address]   [facility] Health Urgent Care at [facility] Get Driving Directions  [phone] [address] [place], [place] [address]   [facility] Health Urgent Care at [facility] Directions [phone] [address] [place], [place] [address]  Your MyChart E-visit questionnaire answers were reviewed by a board certified advanced clinical practitioner to complete your personal care plan based on your specific symptoms.  Thank you for using e-Visits.

## 2023-09-21 ENCOUNTER — Telehealth: Payer: 59 | Admitting: Physician Assistant

## 2023-09-21 DIAGNOSIS — K0889 Other specified disorders of teeth and supporting structures: Secondary | ICD-10-CM

## 2023-09-21 MED ORDER — CLINDAMYCIN HCL 300 MG PO CAPS: 300.0000 mg | ORAL_CAPSULE | Freq: Three times a day (TID) | ORAL | 0 refills | Status: AC

## 2023-09-21 NOTE — Progress Notes (Signed)
Virtual Visit Consent   Tiffany Hall, you are scheduled for a virtual visit with a [facility] provider today. Just as with appointments in the office, your consent must be obtained to participate. Your consent will be active for this visit and any virtual visit you may have with one of our providers in the next 365 days. If you have a MyChart account, a copy of this consent can be sent to you electronically.  As this is a virtual visit, video technology does not allow for your provider to perform a traditional examination. This may limit your provider's ability to fully assess your condition. If your provider identifies any concerns that need to be evaluated in person or the need to arrange testing (such as labs, EKG, etc.), we will make arrangements to do so. Although advances in technology are sophisticated, we cannot ensure that it will always work on either your end or our end. If the connection with a video visit is poor, the visit may have to be switched to a telephone visit. With either a video or telephone visit, we are not always able to ensure that we have a secure connection.  By engaging in this virtual visit, you consent to the provision of healthcare and authorize for your insurance to be billed (if applicable) for the services provided during this visit. Depending on your insurance coverage, you may receive a charge related to this service.  I need to obtain your verbal consent now. Are you willing to proceed with your visit today? Tiffany Hall has provided verbal consent on 09/21/2023 for a virtual visit (video or telephone). Tiffany Fantasia Ward, PA-C  Date: 09/21/2023 6:25 PM  Virtual Visit via Video Note   I, Tiffany Hall, connected with  Tiffany Hall  ([id number], [birth year]) on 09/21/23 at  6:00 PM EST by a video-enabled telemedicine application and verified that I am speaking with the correct person using two identifiers.  Location: Patient: Virtual Visit Location  Patient: Home Provider: Virtual Visit Location Provider: Home Office   I discussed the limitations of evaluation and management by telemedicine and the availability of in person appointments. The patient expressed understanding and agreed to proceed.    History of Present Illness: Tiffany Hall is a 37 y.o. who identifies as a female who was assigned female at birth, and is being seen today for dental pain, left lower molar, that started yesterday.  Denies fever, chills, swelling.  She has tried tylenol with minimal relief.  She does have a dentist.  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 No  Did it involve sudden or severe rash/hives, skin peeling, or any reaction on the inside of your mouth or nose? No  Did you need to seek medical attention at a hospital or doctor's office? No  When did it last happen?      last year  If all above answers are "NO", may proceed with cephalosporin use.  After dental work. Pt unsure if allergy or bad swelling related to  surgery and abscess. Pt reports having penicillin and possibly amoxicillin afterwards.   Nubain [Nalbuphine Hcl] Swelling, Palpitations and Other (See Comments)    Pt states that she is able to take Percocet without any problems.      Medications:  Current Outpatient Medications:    clindamycin (CLEOCIN) 300 MG capsule, Take 1 capsule (300 mg total) by mouth 3 (three) times daily for 5 days., Disp: 15 capsule, Rfl: 0   Accu-Chek Softclix Lancets lancets, Use as instructed  (Patient not taking: Reported on 01/12/2021), Disp: 100 each, Rfl: 12   albuterol (VENTOLIN HFA) 108 (90 Base) MCG/ACT inhaler, Inhale 2 puffs into the lungs every 6 (six) hours as needed for wheezing or shortness of breath., Disp: 8 g, Rfl: 0   benzonatate (TESSALON) 200 MG capsule, Take 1 capsule (200 mg total) by mouth 2 (two) times daily as needed for cough., Disp: 20 capsule, Rfl: 0   Blood Glucose Monitoring Suppl (ACCU-CHEK GUIDE) w/Device KIT, 1 Device by Does not apply route 4 (four) times daily. (Patient not taking: Reported on 01/12/2021), Disp: 1 kit, Rfl: 0   Blood Pressure Monitoring (BLOOD PRESSURE KIT) DEVI, 1 kit by Does not apply route once a week. Check Blood Pressure regularly and record readings into the [employer] App.  Large Cuff.  DX O90.0 (Patient not taking: Reported on 01/12/2021), Disp: 1 each, Rfl: 0   dicyclomine (BENTYL) 10 MG capsule, Take 1 capsule (10 mg total) by mouth 4 (four) times daily -  before meals and at bedtime., Disp: 21 capsule, Rfl: 0   ferrous sulfate 325 (65 FE) MG tablet, Take 1 tablet (325 mg total) by mouth every other day. (Patient taking differently: Take 325 mg by mouth daily.), Disp: 45 tablet, Rfl: 3   fluconazole (DIFLUCAN) 150 MG tablet, Take 1 tablet (150 mg total) by mouth daily., Disp: 1 tablet, Rfl: 0   fluticasone (FLONASE) 50 MCG/ACT nasal spray, Place 2 sprays into both nostrils daily., Disp: 16 g, Rfl: 0   glucose blood (ACCU-CHEK GUIDE) test strip, Use to check blood sugars four times a day was instructed (Patient not taking: Reported on 01/12/2021), Disp: 50 each, Rfl: 12   ibuprofen (ADVIL) 800 MG tablet, Take 1 tablet (800 mg total) by mouth every 8 (eight) hours as needed., Disp: 30 tablet, Rfl: 0   JUNEL FE 1/20 1-20 MG-MCG tablet, Take 1 tablet by mouth daily., Disp: , Rfl:    ondansetron (ZOFRAN) 4 MG tablet, Take 1 tablet (4 mg total) by mouth every 8 (eight) hours as needed for nausea or vomiting., Disp: 20 tablet, Rfl: 0   sucralfate  (CARAFATE) 1 g tablet, Take 1 tablet (1 g total) by mouth 4 (four) times daily -  with meals and at bedtime for 7 days., Disp: 28 tablet, Rfl: 0  Observations/Objective: Patient is well-developed, well-nourished in no acute distress.  Resting comfortably at home.  Head is normocephalic, atraumatic.  No labored breathing.  Speech is clear and coherent with logical content.  Patient is alert and oriented at baseline.    Assessment and Plan: 1. Pain, dental (Primary)  Antibiotic prescribed.  Supportive care discussed.  Advised to contact dentist on Monday.  Work note given.   Follow Up Instructions: I discussed the assessment and treatment plan with the patient. The patient was provided an opportunity to ask questions and all were answered. The patient agreed with the plan and demonstrated an understanding of the instructions.  A copy of instructions were sent to the  patient via MyChart unless otherwise noted below.     The patient was advised to call back or seek an in-person evaluation if the symptoms worsen or if the condition fails to improve as anticipated.    Tiffany Fantasia Ward, PA-C

## 2023-09-21 NOTE — Patient Instructions (Signed)
Tiffany Hall, thank you for joining Tiffany Fantasia Ward, PA-C for today's virtual visit.  While this provider is not your primary care provider (PCP), if your PCP is located in our provider database this encounter information will be shared with them immediately following your visit.   A [facility] MyChart account gives you access to today's visit and all your visits, tests, and labs performed at [facility] " click here if you don't have a [facility] MyChart account or go to [email or web address]  Consent: (Patient) Tiffany Hall provided verbal consent for this virtual visit at the beginning of the encounter.  Current Medications:  Current Outpatient Medications:    clindamycin (CLEOCIN) 300 MG capsule, Take 1 capsule (300 mg total) by mouth 3 (three) times daily for 5 days., Disp: 15 capsule, Rfl: 0   Accu-Chek Softclix Lancets lancets, Use as instructed (Patient not taking: Reported on 01/12/2021), Disp: 100 each, Rfl: 12   albuterol (VENTOLIN HFA) 108 (90 Base) MCG/ACT inhaler, Inhale 2 puffs into the lungs every 6 (six) hours as needed for wheezing or shortness of breath., Disp: 8 g, Rfl: 0   benzonatate (TESSALON) 200 MG capsule, Take 1 capsule (200 mg total) by mouth 2 (two) times daily as needed for cough., Disp: 20 capsule, Rfl: 0   Blood Glucose Monitoring Suppl (ACCU-CHEK GUIDE) w/Device KIT, 1 Device by Does not apply route 4 (four) times daily. (Patient not taking: Reported on 01/12/2021), Disp: 1 kit, Rfl: 0   Blood Pressure Monitoring (BLOOD PRESSURE KIT) DEVI, 1 kit by Does not apply route once a week. Check Blood Pressure regularly and record readings into the [employer] App.  Large Cuff.  DX O90.0 (Patient not taking: Reported on 01/12/2021), Disp: 1 each, Rfl: 0   dicyclomine (BENTYL) 10 MG capsule, Take 1 capsule (10 mg total) by mouth 4 (four) times daily -  before meals and at bedtime., Disp: 21 capsule, Rfl: 0   ferrous sulfate 325 (65 FE) MG tablet, Take 1  tablet (325 mg total) by mouth every other day. (Patient taking differently: Take 325 mg by mouth daily.), Disp: 45 tablet, Rfl: 3   fluconazole (DIFLUCAN) 150 MG tablet, Take 1 tablet (150 mg total) by mouth daily., Disp: 1 tablet, Rfl: 0   fluticasone (FLONASE) 50 MCG/ACT nasal spray, Place 2 sprays into both nostrils daily., Disp: 16 g, Rfl: 0   glucose blood (ACCU-CHEK GUIDE) test strip, Use to check blood sugars four times a day was instructed (Patient not taking: Reported on 01/12/2021), Disp: 50 each, Rfl: 12   ibuprofen (ADVIL) 800 MG tablet, Take 1 tablet (800 mg total) by mouth every 8 (eight) hours as needed., Disp: 30 tablet, Rfl: 0   JUNEL FE 1/20 1-20 MG-MCG tablet, Take 1 tablet by mouth daily., Disp: , Rfl:    ondansetron (ZOFRAN) 4 MG tablet, Take 1 tablet (4 mg total) by mouth every 8 (eight) hours as needed for nausea or vomiting., Disp: 20 tablet, Rfl: 0   sucralfate (CARAFATE) 1 g tablet, Take 1 tablet (1 g total) by mouth 4 (four) times daily -  with meals and at bedtime for 7 days., Disp: 28 tablet, Rfl: 0   Medications ordered in this encounter:  Meds ordered this encounter  Medications   clindamycin (CLEOCIN) 300 MG capsule    Sig: Take 1 capsule (300 mg total) by mouth 3 (three) times daily for 5 days.    Dispense:  15 capsule    Refill:  0  Supervising Provider:   Merrilee Jansky [[id number]]     *If you need refills on other medications prior to your next appointment, please contact your pharmacy*  Follow-Up: Call back or seek an in-person evaluation if the symptoms worsen or if the condition fails to improve as anticipated.  [facility] [phone]  Other Instructions Take antibiotic as prescribed.  Recommend Tylenol or Ibuprofen as needed.  Call dentist Monday for further recommendations.    If you have been instructed to have an in-person evaluation today at a local Urgent Care facility, please use the link below. It will take you to a list  of all of our available [facility], including address, phone number and hours of operation. Please do not delay care.  [facility]  If you or a family member do not have a primary care provider, use the link below to schedule a visit and establish care. When you choose a [facility] primary care physician or advanced practice provider, you gain a long-term partner in health. Find a Primary Care Provider  Learn more about [facility]'s in-office and virtual care options:  - Get Care Now

## 2024-02-12 ENCOUNTER — Telehealth

## 2024-02-12 DIAGNOSIS — K047 Periapical abscess without sinus: Secondary | ICD-10-CM

## 2024-02-13 MED ORDER — CLINDAMYCIN HCL 300 MG PO CAPS
300.0000 mg | ORAL_CAPSULE | Freq: Three times a day (TID) | ORAL | 0 refills | Status: AC
Start: 2024-02-13 — End: 2024-02-20

## 2024-02-13 NOTE — Progress Notes (Signed)
 E-Visit for Dental Pain  We are sorry that you are not feeling well.  Here is how we plan to help!  Based on what you have shared with me in the questionnaire, it sounds like you have a dental infection.   I have prescribed Clindamycin 300mg  3 times a day for 7 days  It is imperative that you see a dentist within 10 days of this eVisit to determine the cause of the dental pain and be sure it is adequately treated  A toothache or tooth pain is caused when the nerve in the root of a tooth or surrounding a tooth is irritated. Dental (tooth) infection, decay, injury, or loss of a tooth are the most common causes of dental pain. Pain may also occur after an extraction (tooth is pulled out). Pain sometimes originates from other areas and radiates to the jaw, thus appearing to be tooth pain.Bacteria growing inside your mouth can contribute to gum disease and dental decay, both of which can cause pain. A toothache occurs from inflammation of the central portion of the tooth called pulp. The pulp contains nerve endings that are very sensitive to pain. Inflammation to the pulp or pulpitis may be caused by dental cavities, trauma, and infection.    HOME CARE:   For toothaches: Over-the-counter pain medications such as acetaminophen or ibuprofen may be used. Take these as directed on the package while you arrange for a dental appointment. Avoid very cold or hot foods, because they may make the pain worse. You may get relief from biting on a cotton ball soaked in oil of cloves. You can get oil of cloves at most drug stores.  For jaw pain:  Aspirin may be helpful for problems in the joint of the jaw in adults. If pain happens every time you open your mouth widely, the temporomandibular joint (TMJ) may be the source of the pain. Yawning or taking a large bite of food may worsen the pain. An appointment with your doctor or dentist will help you find the cause.     GET HELP RIGHT AWAY IF:  You have a high  fever or chills If you have had a recent head or face injury and develop headache, light headedness, nausea, vomiting, or other symptoms that concern you after an injury to your face or mouth, you could have a more serious injury in addition to your dental injury. A facial rash associated with a toothache: This condition may improve with medication. Contact your doctor for them to decide what is appropriate. Any jaw pain occurring with chest pain: Although jaw pain is most commonly caused by dental disease, it is sometimes referred pain from other areas. People with heart disease, especially people who have had stents placed, people with diabetes, or those who have had heart surgery may have jaw pain as a symptom of heart attack or angina. If your jaw or tooth pain is associated with lightheadedness, sweating, or shortness of breath, you should see a doctor as soon as possible. Trouble swallowing or excessive pain or bleeding from gums: If you have a history of a weakened immune system, diabetes, or steroid use, you may be more susceptible to infections. Infections can often be more severe and extensive or caused by unusual organisms. Dental and gum infections in people with these conditions may require more aggressive treatment. An abscess may need draining or IV antibiotics, for example.  MAKE SURE YOU   Understand these instructions. Will watch your condition. Will get help  right away if you are not doing well or get worse.  Thank you for choosing an e-visit.  Your e-visit answers were reviewed by a board certified advanced clinical practitioner to complete your personal care plan. Depending upon the condition, your plan could have included both over the counter or prescription medications.  Please review your pharmacy choice. Make sure the pharmacy is open so you can pick up prescription now. If there is a problem, you may contact your provider through [employer] and have the prescription  routed to another pharmacy.  Your safety is important to [place]. If you have drug allergies check your prescription carefully.   For the next 24 hours you can use MyChart to ask questions about today's visit, request a non-urgent call back, or ask for a work or school excuse. You will get an email in the next two days asking about your experience. I hope that your e-visit has been valuable and will speed your recovery.  I have spent 5 minutes in review of e-visit questionnaire, review and updating patient chart, medical decision making and response to patient.   Margaretann Loveless, PA-C

## 2024-03-23 ENCOUNTER — Ambulatory Visit
Admission: EM | Admit: 2024-03-23 | Discharge: 2024-03-23 | Disposition: A | Discharge status: Home / Self Care | Attending: Family Medicine | Admitting: Family Medicine

## 2024-03-23 DIAGNOSIS — J988 Other specified respiratory disorders: Secondary | ICD-10-CM | POA: Diagnosis not present

## 2024-03-23 DIAGNOSIS — B9789 Other viral agents as the cause of diseases classified elsewhere: Secondary | ICD-10-CM

## 2024-03-23 DIAGNOSIS — R0789 Other chest pain: Secondary | ICD-10-CM

## 2024-03-23 LAB — POC COVID19/FLU A&B COMBO
Covid Antigen, POC: NEGATIVE
Influenza A Antigen, POC: NEGATIVE
Influenza B Antigen, POC: NEGATIVE

## 2024-03-23 MED ORDER — PREDNISONE 20 MG PO TABS: ORAL_TABLET | ORAL | 0 refills | Status: AC

## 2024-03-23 MED ORDER — PROMETHAZINE-DM 6.25-15 MG/5ML PO SYRP: 5.0000 mL | ORAL_SOLUTION | Freq: Three times a day (TID) | ORAL | 0 refills | Status: AC | PRN

## 2024-03-23 MED ORDER — CETIRIZINE HCL 10 MG PO TABS: 10.0000 mg | ORAL_TABLET | Freq: Every day | ORAL | 0 refills | Status: AC

## 2024-03-23 NOTE — ED Triage Notes (Signed)
 Pt c/o dry cough, chest tightness, SHOB x 2 days-NAD-taking dose advil /tylenol  last night-steady gait

## 2024-03-23 NOTE — ED Provider Notes (Signed)
 [facility]  Note:  This document was prepared using Conservation officer, historic buildings and may include unintentional dictation errors.  MRN: [id number] DOB: [birth year]  Subjective:   Tiffany Hall is a 38 y.o. female presenting for 2-day history of chest tightness, chest pain, shortness of breath, coughing.  Has also had general malaise, body pains, fatigue.  No history of asthma.  No smoking of any kind including cigarettes, cigars, vaping, marijuana use.  No changes to bowel or urinary habits.  No headache, confusion, ear pain, throat pain.  No nausea, vomiting, rashes.  Patient had concerns about pulmonary embolism.  No history of PE, DVT.  No recent hospitalizations, major trauma, clotting disorders.  No current facility-administered medications for this encounter.  Current Outpatient Medications:    Accu-Chek Softclix Lancets lancets, Use as instructed (Patient not taking: Reported on 01/12/2021), Disp: 100 each, Rfl: 12   albuterol  (VENTOLIN  HFA) 108 (90 Base) MCG/ACT inhaler, Inhale 2 puffs into the lungs every 6 (six) hours as needed for wheezing or shortness of breath., Disp: 8 g, Rfl: 0   benzonatate  (TESSALON ) 200 MG capsule, Take 1 capsule (200 mg total) by mouth 2 (two) times daily as needed for cough., Disp: 20 capsule, Rfl: 0   Blood Glucose Monitoring Suppl (ACCU-CHEK GUIDE) w/Device KIT, 1 Device by Does not apply route 4 (four) times daily. (Patient not taking: Reported on 01/12/2021), Disp: 1 kit, Rfl: 0   Blood Pressure Monitoring (BLOOD PRESSURE KIT) DEVI, 1 kit by Does not apply route once a week. Check Blood Pressure regularly and record readings into the [employer] App.  Large Cuff.  DX O90.0 (Patient not taking: Reported on 01/12/2021), Disp: 1 each, Rfl: 0   dicyclomine  (BENTYL ) 10 MG capsule, Take 1 capsule (10 mg total) by mouth 4 (four) times daily -  before meals and at bedtime., Disp: 21 capsule, Rfl: 0   ferrous sulfate  325 (65 FE) MG  tablet, Take 1 tablet (325 mg total) by mouth every other day. (Patient taking differently: Take 325 mg by mouth daily.), Disp: 45 tablet, Rfl: 3   fluconazole  (DIFLUCAN ) 150 MG tablet, Take 1 tablet (150 mg total) by mouth daily., Disp: 1 tablet, Rfl: 0   fluticasone  (FLONASE ) 50 MCG/ACT nasal spray, Place 2 sprays into both nostrils daily., Disp: 16 g, Rfl: 0   glucose blood (ACCU-CHEK GUIDE) test strip, Use to check blood sugars four times a day was instructed (Patient not taking: Reported on 01/12/2021), Disp: 50 each, Rfl: 12   ibuprofen  (ADVIL ) 800 MG tablet, Take 1 tablet (800 mg total) by mouth every 8 (eight) hours as needed., Disp: 30 tablet, Rfl: 0   JUNEL FE 1/20 1-20 MG-MCG tablet, Take 1 tablet by mouth daily., Disp: , Rfl:    ondansetron  (ZOFRAN ) 4 MG tablet, Take 1 tablet (4 mg total) by mouth every 8 (eight) hours as needed for nausea or vomiting., Disp: 20 tablet, Rfl: 0   sucralfate  (CARAFATE ) 1 g tablet, Take 1 tablet (1 g total) by mouth 4 (four) times daily -  with meals and at bedtime for 7 days., Disp: 28 tablet, Rfl: 0   Allergies  Allergen Reactions   Amoxicillin  Swelling and Anaphylaxis    Did it involve swelling of the face/tongue/throat, SOB, or low BP? No  Did it involve sudden or severe rash/hives, skin peeling, or any reaction on the inside of your mouth or nose? No  Did you need to seek medical attention at a hospital  or doctor's office? No  When did it last happen?      last year  If all above answers are NO, may proceed with cephalosporin use.  After dental work. Pt unsure if allergy or bad swelling related to surgery and abscess. Pt reports having penicillin  and possibly amoxicillin  afterwards.   Nubain [Nalbuphine Hcl] Swelling, Palpitations and Other (See Comments)    Pt states that she is able to take Percocet without any problems.       Past Medical History:  Diagnosis Date   Anemia    Anxiety    Depression    GERD (gastroesophageal reflux  disease)    medication not needed   Gestational diabetes    insulin    Heart murmur    History of abnormal cervical Pap smear    2009   History of cardiac murmur as a child    History of chlamydia    2008   History of gallstones    2012   History of gonorrhea    2008   History of ovarian cyst    History of trichomonal vaginitis    2008   HSV (herpes simplex virus) anogenital infection    Hypertension    PIH with pregnancy   Migraine headache    Preeclampsia in postpartum period 01/15/2015   Pregnancy induced hypertension    Sickle cell trait (HCC)    Vaginal Pap smear, abnormal    HPV +     Past Surgical History:  Procedure Laterality Date   CESAREAN SECTION MULTI-GESTATIONAL N/A 01/05/2021   Procedure: CESAREAN SECTION MULTI-GESTATIONAL;  Surgeon: Verlyn Goad, MD;  Location: [facility];  Service: Obstetrics;  Laterality: N/A;   COLPOSCOPY     dilatation and currettage  N/A    DILATION AND CURETTAGE OF UTERUS  2009   W/  SUCTION --  required blood transfusion   DILATION AND EVACUATION N/A 09/14/2015   Procedure: DILATATION AND EVACUATION;  Surgeon: Dyanna Glasgow, DO;  Location: [facility];  Service: Gynecology;  Laterality: N/A;    Family History  Problem Relation Age of Onset   Breast cancer Mother    Diabetes Mother    Hypertension Mother    Hepatitis Mother    Cancer Mother 26       Breast, cervical   Breast cancer Maternal Grandmother    Diabetes Maternal Grandmother    Hypertension Maternal Grandmother    Cancer Maternal Grandmother 19       Breast   Anesthesia problems Neg Hx    Other Neg Hx     Social History   Tobacco Use   Smoking status: Former    Current packs/day: 0.00    Types: Cigarettes    Quit date: 11/05/2010    Years since quitting: 13.3   Smokeless tobacco: Never   Tobacco comments:    quit with preg  Vaping Use   Vaping status: Never Used  Substance Use Topics   Alcohol use: Yes    Comment: occ   Drug use: Not Currently    Types:  MDMA (Ecstacy)    ROS   Objective:   Vitals: BP 131/86 (BP Location: Left Arm)   Pulse 72   Temp 98.7 F (37.1 C) (Oral)   Resp 16   SpO2 98%   Physical Exam Constitutional:      General: She is not in acute distress.    Appearance: Normal appearance. She is well-developed and normal weight. She is not ill-appearing, toxic-appearing or diaphoretic.  HENT:     Head: Normocephalic and atraumatic.     Right Ear: Tympanic membrane, ear canal and external ear normal. No drainage or tenderness. No middle ear effusion. There is no impacted cerumen. Tympanic membrane is not erythematous or bulging.     Left Ear: Tympanic membrane, ear canal and external ear normal. No drainage or tenderness.  No middle ear effusion. There is no impacted cerumen. Tympanic membrane is not erythematous or bulging.     Nose: Nose normal. No congestion or rhinorrhea.     Mouth/Throat:     Mouth: Mucous membranes are moist. No oral lesions.     Pharynx: No pharyngeal swelling, oropharyngeal exudate, posterior oropharyngeal erythema or uvula swelling.     Tonsils: No tonsillar exudate or tonsillar abscesses.   Eyes:     General: No scleral icterus.       Right eye: No discharge.        Left eye: No discharge.     Extraocular Movements: Extraocular movements intact.     Right eye: Normal extraocular motion.     Left eye: Normal extraocular motion.     Conjunctiva/sclera: Conjunctivae normal.    Cardiovascular:     Rate and Rhythm: Normal rate and regular rhythm.     Heart sounds: Normal heart sounds. No murmur heard.    No friction rub. No gallop.  Pulmonary:     Effort: Pulmonary effort is normal. No respiratory distress.     Breath sounds: No stridor. No wheezing, rhonchi or rales.  Chest:     Chest wall: No tenderness.   Musculoskeletal:     Cervical back: Normal range of motion and neck supple.  Lymphadenopathy:     Cervical: No cervical adenopathy.   Skin:    General: Skin is warm and  dry.   Neurological:     General: No focal deficit present.     Mental Status: She is alert and oriented to person, place, and time.   Psychiatric:        Mood and Affect: Mood normal.        Behavior: Behavior normal.     Results for orders placed or performed during the hospital encounter of 03/23/24 (from the past 24 hours)  POC Covid + Flu A/B Antigen     Status: Normal   Collection Time: 03/23/24 11:53 AM  Result Value Ref Range   Influenza A Antigen, POC Negative Negative   Influenza B Antigen, POC Negative Negative   Covid Antigen, POC Negative Negative    Assessment and Plan :   PDMP not reviewed this encounter.  1. Viral respiratory infection   2. Atypical chest pain    Low PERC score, will defer ER visit for workup and rule out of PE.  Given her significant respiratory symptoms and concerns for severe illness, I offered an oral prednisone  course.  Otherwise, suspect viral URI, viral syndrome. Physical exam findings reassuring and vital signs stable for discharge. Advised supportive care, offered symptomatic relief. Counseled patient on potential for adverse effects with medications prescribed/recommended today, ER and return-to-clinic precautions discussed, patient verbalized understanding.     Adolph Hoop, [place] 03/23/24 1951

## 2024-03-23 NOTE — Discharge Instructions (Addendum)
 We will manage this as a viral syndrome. For sore throat or cough try using a honey-based tea. Use 3 teaspoons of honey with juice squeezed from half lemon. Place shaved pieces of ginger into 1/2-1 cup of water  and warm over stove top. Then mix the ingredients and repeat every 4 hours as needed. Please take Tylenol  500mg -650mg  once every 6 hours for fevers, aches and pains. Hydrate very well with at least 2 liters (64 ounces) of water . Eat light meals such as soups (chicken and noodles, chicken wild rice, vegetable).  Do not eat any foods that you are allergic to.  Start an antihistamine like Zyrtec (10mg  daily) for postnasal drainage, sinus congestion.  You can take this together with prednisone  for 5 days as a respiratory boost. Use cough syrup as needed.

## 2024-05-30 ENCOUNTER — Telehealth: Admitting: Family Medicine

## 2024-05-30 DIAGNOSIS — M549 Dorsalgia, unspecified: Secondary | ICD-10-CM | POA: Diagnosis not present

## 2024-05-30 MED ORDER — BACLOFEN 10 MG PO TABS: 10.0000 mg | ORAL_TABLET | Freq: Three times a day (TID) | ORAL | 0 refills | Status: AC

## 2024-05-30 MED ORDER — NAPROXEN 500 MG PO TABS: 500.0000 mg | ORAL_TABLET | Freq: Two times a day (BID) | ORAL | 0 refills | Status: AC

## 2024-05-30 NOTE — Progress Notes (Signed)
 E-Visit for Back Pain   We are sorry that you are not feeling well.  Here is how we plan to help!  Based on what you have shared with me it looks like you mostly have acute back pain.  Acute back pain is defined as musculoskeletal pain that can resolve in 1-3 weeks with conservative treatment.  I have prescribed Naprosyn 500 mg take one by mouth twice a day non-steroid anti-inflammatory (NSAID) as well as Baclofen 10 mg every eight hours as needed which is a muscle relaxer  Some patients experience stomach irritation or in increased heartburn with anti-inflammatory drugs.  Please keep in mind that muscle relaxer's can cause fatigue and should not be taken while at work or driving.  Back pain is very common.  The pain often gets better over time.  The cause of back pain is usually not dangerous.  Most people can learn to manage their back pain on their own.  Home Care Stay active.  Start with short walks on flat ground if you can.  Try to walk farther each day. Do not sit, drive or stand in one place for more than 30 minutes.  Do not stay in bed. Do not avoid exercise or work.  Activity can help your back heal faster. Be careful when you bend or lift an object.  Bend at your knees, keep the object close to you, and do not twist. Sleep on a firm mattress.  Lie on your side, and bend your knees.  If you lie on your back, put a pillow under your knees. Only take medicines as told by your doctor. Put ice on the injured area. Put ice in a plastic bag Place a towel between your skin and the bag Leave the ice on for 15-20 minutes, 3-4 times a day for the first 2-3 days. 210 After that, you can switch between ice and heat packs. Ask your doctor about back exercises or massage. Avoid feeling anxious or stressed.  Find good ways to deal with stress, such as exercise.  Get Help Right Way If: Your pain does not go away with rest or medicine. Your pain does not go away in 1 week. You have new  problems. You do not feel well. The pain spreads into your legs. You cannot control when you poop (bowel movement) or pee (urinate) You feel sick to your stomach (nauseous) or throw up (vomit) You have belly (abdominal) pain. You feel like you may pass out (faint). If you develop a fever.  Make Sure you: Understand these instructions. Will watch your condition Will get help right away if you are not doing well or get worse.  Your e-visit answers were reviewed by a board certified advanced clinical practitioner to complete your personal care plan.  Depending on the condition, your plan could have included both over the counter or prescription medications.  If there is a problem please reply  once you have received a response from your provider.  Your safety is important to [place].  If you have drug allergies check your prescription carefully.    You can use MyChart to ask questions about today's visit, request a non-urgent call back, or ask for a work or school excuse for 24 hours related to this e-Visit. If it has been greater than 24 hours you will need to follow up with your provider, or enter a new e-Visit to address those concerns.  You will get an e-mail in the next two days asking about  your experience.  I hope that your e-visit has been valuable and will speed your recovery. Thank you for using e-visits.   have provided 5 minutes of non face to face time during this encounter for chart review and documentation.

## 2024-06-01 ENCOUNTER — Emergency Department (HOSPITAL_COMMUNITY)

## 2024-06-01 ENCOUNTER — Emergency Department (HOSPITAL_COMMUNITY)
Admission: EM | Admit: 2024-06-01 | Discharge: 2024-06-01 | Disposition: A | Discharge status: Home / Self Care | Attending: Emergency Medicine | Admitting: Emergency Medicine

## 2024-06-01 ENCOUNTER — Encounter (HOSPITAL_COMMUNITY): Payer: Self-pay | Admitting: *Deleted

## 2024-06-01 ENCOUNTER — Encounter: Payer: Self-pay | Admitting: Gastroenterology

## 2024-06-01 ENCOUNTER — Other Ambulatory Visit: Payer: Self-pay

## 2024-06-01 DIAGNOSIS — S29019A Strain of muscle and tendon of unspecified wall of thorax, initial encounter: Secondary | ICD-10-CM

## 2024-06-01 DIAGNOSIS — M546 Pain in thoracic spine: Secondary | ICD-10-CM | POA: Diagnosis present

## 2024-06-01 DIAGNOSIS — K8020 Calculus of gallbladder without cholecystitis without obstruction: Secondary | ICD-10-CM

## 2024-06-01 LAB — CBC
HCT: 34.2 % — ABNORMAL LOW (ref 36.0–46.0)
Hemoglobin: 11.6 g/dL — ABNORMAL LOW (ref 12.0–15.0)
MCH: 26.4 pg (ref 26.0–34.0)
MCHC: 33.9 g/dL (ref 30.0–36.0)
MCV: 77.7 fL — ABNORMAL LOW (ref 80.0–100.0)
Platelets: 418 K/uL — ABNORMAL HIGH (ref 150–400)
RBC: 4.4 MIL/uL (ref 3.87–5.11)
RDW: 13.6 % (ref 11.5–15.5)
WBC: 9.7 K/uL (ref 4.0–10.5)
nRBC: 0 % (ref 0.0–0.2)

## 2024-06-01 LAB — COMPREHENSIVE METABOLIC PANEL WITH GFR
ALT: 36 U/L (ref 0–44)
AST: 30 U/L (ref 15–41)
Albumin: 3.3 g/dL — ABNORMAL LOW (ref 3.5–5.0)
Alkaline Phosphatase: 61 U/L (ref 38–126)
Anion gap: 10 (ref 5–15)
BUN: 9 mg/dL (ref 6–20)
CO2: 23 mmol/L (ref 22–32)
Calcium: 9.6 mg/dL (ref 8.9–10.3)
Chloride: 101 mmol/L (ref 98–111)
Creatinine, Ser: 0.57 mg/dL (ref 0.44–1.00)
GFR, Estimated: >60 mL/min (ref >60)
Glucose, Bld: 182 mg/dL — ABNORMAL HIGH (ref 70–99)
Potassium: 3.8 mmol/L (ref 3.5–5.1)
Sodium: 134 mmol/L — ABNORMAL LOW (ref 135–145)
Total Bilirubin: 0.4 mg/dL (ref 0.0–1.2)
Total Protein: 7 g/dL (ref 6.5–8.1)

## 2024-06-01 LAB — URINALYSIS, ROUTINE W REFLEX MICROSCOPIC
Bacteria, UA: NONE SEEN
Bilirubin Urine: NEGATIVE
Glucose, UA: NEGATIVE mg/dL
Ketones, ur: NEGATIVE mg/dL
Leukocytes,Ua: NEGATIVE
Nitrite: NEGATIVE
Protein, ur: NEGATIVE mg/dL
Specific Gravity, Urine: 1.003 — ABNORMAL LOW (ref 1.005–1.030)
pH: 7 (ref 5.0–8.0)

## 2024-06-01 LAB — HCG, SERUM, QUALITATIVE: Preg, Serum: NEGATIVE

## 2024-06-01 LAB — LIPASE, BLOOD: Lipase: 31 U/L (ref 11–51)

## 2024-06-01 MED ORDER — CELECOXIB 100 MG PO CAPS
100.0000 mg | ORAL_CAPSULE | Freq: Two times a day (BID) | ORAL | 0 refills | Status: AC
Start: 2024-06-01 — End: ?

## 2024-06-01 NOTE — Discharge Instructions (Addendum)
 As discussed, you can take Tylenol  if the prescribed medication is not adequately managing your pain.  Otherwise please follow-up with your primary care, and also regarding the findings on the CT scan and ultrasound, please follow-up with the GI that has been referred to you.

## 2024-06-01 NOTE — ED Triage Notes (Signed)
 C/o having back spasms on Fri, , sat pain was worse in the right upper flank area felt like she was hungry and last pm got worse and now is in the front. Denies nausea vomiitng

## 2024-06-01 NOTE — ED Notes (Signed)
 Patient able to ambulate from room to restroom and back independently.

## 2024-06-01 NOTE — ED Provider Notes (Signed)
 [facility] Provider Note   CSN: [id number] Arrival date & time: 06/01/24  0236     Patient presents with: Abdominal Pain and Back Pain   Tiffany Hall is a 38 y.o. female presents to the ER today with right-sided upper back pain beneath the shoulder blade that started on Friday.  Pain started after she was twisting in her office chair at home trying to stretch her back.  She says she felt immediate tugging sensation and has had this pain present since that time.  Pain is exacerbated with movement, no identified alleviating factors.  Pain does not radiate, is not referred pain, denies any chest pain, shortness of breath, or palpitations.  Also endorses sensation x 2 days that she is having hunger pains in the central and left upper abdomen, however is eating normally. Irregular menses on continiuous birthcontrol. Started her menstrual cycle today.   HPI     Prior to Admission medications   Medication Sig Start Date End Date Taking? Authorizing Provider  Accu-Chek Softclix Lancets lancets Use as instructed Patient not taking: Reported on 01/12/2021 08/04/20   Rudy Carlin LABOR, MD  albuterol  (VENTOLIN  HFA) 108 [phone] Base) MCG/ACT inhaler Inhale 2 puffs into the lungs every 6 (six) hours as needed for wheezing or shortness of breath. 12/21/22   Moishe Chiquita HERO, NP  baclofen  (LIORESAL ) 10 MG tablet Take 1 tablet (10 mg total) by mouth 3 (three) times daily. 05/30/24   Blair, Diane W, FNP  benzonatate  (TESSALON ) 200 MG capsule Take 1 capsule (200 mg total) by mouth 2 (two) times daily as needed for cough. 05/18/23   Blair, Diane W, FNP  Blood Glucose Monitoring Suppl (ACCU-CHEK GUIDE) w/Device KIT 1 Device by Does not apply route 4 (four) times daily. Patient not taking: Reported on 01/12/2021 08/04/20   Rudy Carlin LABOR, MD  Blood Pressure Monitoring (BLOOD PRESSURE KIT) DEVI 1 kit by Does not apply route once a week. Check Blood Pressure regularly and  record readings into the [employer] App.  Large Cuff.  DX O90.0 Patient not taking: Reported on 01/12/2021 07/06/20   Constant, Peggy, MD  cetirizine  (ZYRTEC  ALLERGY) 10 MG tablet Take 1 tablet (10 mg total) by mouth daily. 03/23/24   Christopher Savannah, PA-C  dicyclomine  (BENTYL ) 10 MG capsule Take 1 capsule (10 mg total) by mouth 4 (four) times daily -  before meals and at bedtime. 01/24/23   Vivienne Delon HERO, PA-C  ferrous sulfate  325 (65 FE) MG tablet Take 1 tablet (325 mg total) by mouth every other day. Patient taking differently: Take 325 mg by mouth daily. 01/08/21   Muus, Norleen RAMAN, MD  fluconazole  (DIFLUCAN ) 150 MG tablet Take 1 tablet (150 mg total) by mouth daily. 06/26/23   Gladis Elsie BROCKS, PA-C  fluticasone  (FLONASE ) 50 MCG/ACT nasal spray Place 2 sprays into both nostrils daily. 05/18/23   Blair, Diane W, FNP  glucose blood (ACCU-CHEK GUIDE) test strip Use to check blood sugars four times a day was instructed Patient not taking: Reported on 01/12/2021 08/04/20   Rudy Carlin LABOR, MD  ibuprofen  (ADVIL ) 800 MG tablet Take 1 tablet (800 mg total) by mouth every 8 (eight) hours as needed. 03/18/23   Vivienne Delon HERO, PA-C  JUNEL FE 1/20 1-20 MG-MCG tablet Take 1 tablet by mouth daily.    [provider]  naproxen  (NAPROSYN ) 500 MG tablet Take 1 tablet (500 mg total) by mouth 2 (two) times daily with a meal.  05/30/24   Blair, Diane W, FNP  ondansetron  (ZOFRAN ) 4 MG tablet Take 1 tablet (4 mg total) by mouth every 8 (eight) hours as needed for nausea or vomiting. 04/16/23   Burnette, Jennifer M, PA-C  predniSONE  (DELTASONE ) 20 MG tablet Take 2 tablets daily with breakfast. 03/23/24   Christopher Savannah, PA-C  promethazine -dextromethorphan (PROMETHAZINE -DM) 6.25-15 MG/5ML syrup Take 5 mLs by mouth 3 (three) times daily as needed for cough. 03/23/24   Christopher Savannah, PA-C  sucralfate  (CARAFATE ) 1 g tablet Take 1 tablet (1 g total) by mouth 4 (four) times daily -  with meals and at bedtime for 7 days. 04/16/23  04/23/23  Vivienne Delon HERO, PA-C    Allergies: Amoxicillin  and Nubain [nalbuphine hcl]    Review of Systems  Gastrointestinal: Negative.   Genitourinary:  Positive for vaginal bleeding.  Musculoskeletal:  Positive for back pain.    Updated Vital Signs BP (!) 108/57   Pulse 71   Temp 98.6 F (37 C)   Resp 16   Ht 5' 3.5 (1.613 m)   Wt 90.3 kg   SpO2 99%   BMI 34.70 kg/m   Physical Exam Vitals and nursing note reviewed.  Constitutional:      Appearance: She is not ill-appearing or toxic-appearing.  HENT:     Head: Normocephalic and atraumatic.     Mouth/Throat:     Mouth: Mucous membranes are moist.     Pharynx: No oropharyngeal exudate or posterior oropharyngeal erythema.  Eyes:     General:        Right eye: No discharge.        Left eye: No discharge.     Conjunctiva/sclera: Conjunctivae normal.  Cardiovascular:     Rate and Rhythm: Normal rate and regular rhythm.     Pulses: Normal pulses.  Pulmonary:     Effort: Pulmonary effort is normal. No respiratory distress.     Breath sounds: Normal breath sounds. No wheezing or rales.  Abdominal:     General: Bowel sounds are normal. There is no distension.     Palpations: Abdomen is soft.     Tenderness: There is no abdominal tenderness. There is no right CVA tenderness, left CVA tenderness, guarding or rebound.  Musculoskeletal:        General: No deformity.       Arms:     Cervical back: Neck supple.  Skin:    General: Skin is warm and dry.     Findings: No rash.  Neurological:     Mental Status: She is alert. Mental status is at baseline.  Psychiatric:        Mood and Affect: Mood normal.     (all labs ordered are listed, but only abnormal results are displayed) Labs Reviewed  COMPREHENSIVE METABOLIC PANEL WITH GFR - Abnormal; Notable for the following components:      Result Value   Sodium 134 (*)    Glucose, Bld 182 (*)    Albumin 3.3 (*)    All other components within normal limits  CBC -  Abnormal; Notable for the following components:   Hemoglobin 11.6 (*)    HCT 34.2 (*)    MCV 77.7 (*)    Platelets 418 (*)    All other components within normal limits  URINALYSIS, ROUTINE W REFLEX MICROSCOPIC - Abnormal; Notable for the following components:   Color, Urine COLORLESS (*)    Specific Gravity, Urine 1.003 (*)    Hgb urine dipstick SMALL (*)  All other components within normal limits  LIPASE, BLOOD  HCG, SERUM, QUALITATIVE    EKG: None  Radiology: DG Ribs Unilateral W/Chest Right Result Date: 06/01/2024 CLINICAL DATA:  38 year old female with acute right rib pain after popping back. EXAM: RIGHT RIBS AND CHEST - 3+ VIEW COMPARISON:  Chest radiographs 04/23/2022 and earlier. CT Abdomen and Pelvis 0508 hours today. FINDINGS: PA view of the chest 0534 hours. Cardiac size is at the upper limits of normal, stable since 2023. Other mediastinal contours are within normal limits. Visualized tracheal air column is within normal limits. Stable lung volumes. Negative lung bases by CT this morning, and no pneumothorax, pulmonary edema, pleural effusion or confluent lung opacity identified now. Negative visible bowel gas. Bone mineralization is within normal limits. Two oblique views of the right ribs. No right rib abnormality identified, with negative right lower ribs visible by CT this morning. Mild dextroconvex thoracic scoliosis. Otherwise visible osseous structures appear negative. IMPRESSION: Negative right ribs.  No acute cardiopulmonary abnormality. Electronically Signed   By: VEAR Hurst M.D.   On: 06/01/2024 06:02   CT Renal Stone Study Result Date: 06/01/2024 CLINICAL DATA:  38 year old female with abdomen and flank pain. Gross hematuria. EXAM: CT ABDOMEN AND PELVIS WITHOUT CONTRAST TECHNIQUE: Multidetector CT imaging of the abdomen and pelvis was performed following the standard protocol without IV contrast. RADIATION DOSE REDUCTION: This exam was performed according to the  departmental dose-optimization program which includes automated exposure control, adjustment of the mA and/or kV according to patient size and/or use of iterative reconstruction technique. COMPARISON:  Abdomen ultrasound 06/10/2014. FINDINGS: Lower chest: Heart size is at the upper limits of normal. Mild left lung base atelectasis and/or scarring. No pericardial or pleural effusion. Hepatobiliary: Abnormal. Hepatomegaly. Highly heterogeneous noncontrast appearance of the liver. Favored geographic steatosis. Abnormal gallbladder appears abnormally heterogeneous, with combined hyperdensity and heterogeneity within the lumen including a discrete area of very low density (series 3, image 38) which is likely a lipid laden stones. Mildly indistinct gallbladder wall, but no obvious pericholecystic inflammation. However, evidence of gallstones at the neck of the gallbladder such as series 3, image 26, coronal image 64. Pancreas: Negative. Spleen: Negative.  No splenomegaly. Adrenals/Urinary Tract: Normal adrenal glands. Questionable punctate nephrolithiasis (such as right lower pole coronal image 48). But otherwise negative noncontrast kidneys. No hydronephrosis. Nondilated ureters. Diminutive bladder. Minimal pelvic phleboliths. Stomach/Bowel: Nondilated large and small bowel loops. Mild large bowel redundancy and retained stool. Normal gas containing appendix coronal image 67, tracking into the pelvis. Decompressed terminal ileum. Rectus muscle diastasis but no abdominal hernia. Decompressed stomach and duodenum. No pneumoperitoneum. No free fluid. No mesenteric inflammation identified. Vascular/Lymphatic: Negative noncontrast appearance. Reproductive: Negative noncontrast appearance. Other: No pelvis free fluid. Musculoskeletal: No acute osseous abnormality identified. Mild levoconvex lumbar scoliosis and mildly exaggerated lumbar lordosis. IMPRESSION: 1. Abnormal liver and gallbladder: - enlarged Gallbladder with  heterogeneous lumen likely with extensive sludge and stones. Evidence of a hyperdense stone at the neck of the gallbladder. Recommend Right Upper Quadrant Ultrasound to evaluate the possibility of acute cholecystitis. - Hepatomegaly, and heterogeneous liver, likely geographic steatosis. 2. No other acute or inflammatory process identified in the noncontrast abdomen or pelvis. Questionable punctate nephrolithiasis, but No obstructive uropathy. Normal appendix. Electronically Signed   By: VEAR Hurst M.D.   On: 06/01/2024 05:44     Procedures   Medications Ordered in the ED - No data to display  Medical Decision Making 38 year old female presents concern for right flank pain x 2 days, no associated urinary symptoms.    Hypertension on intake.  Cardiopulmonary and abdominal exams are benign.  Abdominal exam is benign.  No CVAT but there is tenderness palpation over the right thoracic paraspinous musculature with spasm and adjacent tenderness palpation without rebound, guarding, or muscular spasm.  The differential diagnosis of emergent flank pain includes, but is not limited to: Nephrolithiasis/ Renal Colic, Pyelonephritis, Abdominal aortic aneurysm, Aortic dissection, Renal artery embolism, Renal vein thrombosis, Renal infarction, Renal hemorrhage, Mesenteric ischemia, Bladder tumor, Cystitis, Biliary colic, Pancreatitis, Perforated peptic ulcer,  Appendicitis, Inguinal Hernia, Diverticulitis, Bowel obstruction. Shingles, Lower lobe pneumonia, Retroperitoneal hematoma/abscess/tumor, Epidural abscess, Epidural hematoma.  Particularly in females it is important to consider (Ectopic Pregnancy,PID/TOA,Ovarian cyst, Ovarian torsion, STD)    Amount and/or Complexity of Data Reviewed Labs: ordered.    Details: CBC without leukocytosis, anemia hemoglobin of 11.6 which is patient's baseline.  CMP reassuring, lipase negative, pregnancy is negative, urine with scant  hemoglobin. Radiology: ordered.    Details: Chest x-ray with ribs unremarkable.  CT renal study with enlarged gallbladder with sludge and stones, hyperdense stone in the neck of the gallbladder recommending ultrasound to rule out cholecystitis.  Otherwise unremarkable CT.   Care of this patient signed out to oncoming ED provider J.Gilliam, PA-C at time of shift change. All pertinent HPI, physical exam, and laboratory findings were discussed with them prior to my departure. Disposition of patient pending completion of workup, reevaluation, and clinical judgement of oncoming ED provider.   This chart was dictated using voice recognition software, Dragon. Despite the best efforts of this provider to proofread and correct errors, errors may still occur which can change documentation meaning.       Final diagnoses:  None    ED Discharge Orders     None          Bobette Pleasant JONELLE DEVONNA 06/01/24 [id number]    Trine Raynell Moder, MD 06/01/24 [phone]

## 2024-06-01 NOTE — ED Provider Notes (Signed)
 Physical Exam  BP (!) 108/57   Pulse 71   Temp 98.6 F (37 C)   Resp 16   Ht 5' 3.5 (1.613 m)   Wt 90.3 kg   SpO2 99%   BMI 34.70 kg/m   Physical Exam  Procedures  Procedures  ED Course / MDM    Medical Decision Making Amount and/or Complexity of Data Reviewed Labs: ordered. Radiology: ordered.   Care handed off by R. Sponsellar, PA-C  Medical Decision Making:   Tiffany Hall is a 38 y.o. female who presented to the ED today and care initiated by previous provider, with right sided upper back pain 2-3 days.  Exacerbated by movement, suspect MSK etiology, w/u largely unremarkable at this time, though CT imaging does show an enlarged gallbladder w/ sludge/stones.  As a result, she is currently pending a RUQ US .  Disposition pending results. Transaminases and lipase are WNL, likely d/c w/ OP GI f/u.  Initial Study Results:   Laboratory  All laboratory results reviewed without evidence of clinically relevant pathology.   Exceptions include: Slight microcytic anemia is noted but seems to be stable and baseline for this patient.   Radiology:  All images reviewed independently. Agree with radiology report at this time.   US  Abdomen Limited RUQ (LIVER/GB) Result Date: 06/01/2024 CLINICAL DATA:  [id number]. Right upper quadrant pain. Abnormal gallbladder on noncontrast CT today. EXAM: ULTRASOUND ABDOMEN LIMITED RIGHT UPPER QUADRANT COMPARISON:  Non-contrast CT today. FINDINGS: Gallbladder: Gallbladder dilatation up to 13 cm length with multiple stones. Largest measured stone is 1.5 cm. There is moderate intraluminal sludge. The anterior free wall is within normal thickness limits measuring 2.7 mm and there was no positive sonographic Murphy's sign or pericholecystic fluid. Common bile duct: Diameter: Prominent measuring 7.9 mm. Correlate clinically with LFTs. No intrahepatic biliary dilatation. Liver: No focal lesion identified. Diffusely echogenic liver consistent with the moderate  fatty replacement noted on CT. Portal vein is patent on color Doppler imaging with normal direction of blood flow towards the liver. Other: No right upper quadrant ascites. IMPRESSION: 1. Gallbladder dilatation with multiple stones and sludge. No wall thickening or pericholecystic fluid. No positive sonographic Murphy's sign. 2. Prominent common bile duct measuring 7.9 mm. Correlate clinically with LFTs. 3. Moderate fatty replacement of the liver. Electronically Signed   By: Francis Quam M.D.   On: 06/01/2024 07:45   DG Ribs Unilateral W/Chest Right Result Date: 06/01/2024 CLINICAL DATA:  38 year old female with acute right rib pain after popping back. EXAM: RIGHT RIBS AND CHEST - 3+ VIEW COMPARISON:  Chest radiographs 04/23/2022 and earlier. CT Abdomen and Pelvis 0508 hours today. FINDINGS: PA view of the chest 0534 hours. Cardiac size is at the upper limits of normal, stable since 2023. Other mediastinal contours are within normal limits. Visualized tracheal air column is within normal limits. Stable lung volumes. Negative lung bases by CT this morning, and no pneumothorax, pulmonary edema, pleural effusion or confluent lung opacity identified now. Negative visible bowel gas. Bone mineralization is within normal limits. Two oblique views of the right ribs. No right rib abnormality identified, with negative right lower ribs visible by CT this morning. Mild dextroconvex thoracic scoliosis. Otherwise visible osseous structures appear negative. IMPRESSION: Negative right ribs.  No acute cardiopulmonary abnormality. Electronically Signed   By: VEAR Hurst M.D.   On: 06/01/2024 06:02   CT Renal Stone Study Result Date: 06/01/2024 CLINICAL DATA:  38 year old female with abdomen and flank pain. Gross hematuria. EXAM: CT ABDOMEN AND  PELVIS WITHOUT CONTRAST TECHNIQUE: Multidetector CT imaging of the abdomen and pelvis was performed following the standard protocol without IV contrast. RADIATION DOSE REDUCTION: This  exam was performed according to the departmental dose-optimization program which includes automated exposure control, adjustment of the mA and/or kV according to patient size and/or use of iterative reconstruction technique. COMPARISON:  Abdomen ultrasound 06/10/2014. FINDINGS: Lower chest: Heart size is at the upper limits of normal. Mild left lung base atelectasis and/or scarring. No pericardial or pleural effusion. Hepatobiliary: Abnormal. Hepatomegaly. Highly heterogeneous noncontrast appearance of the liver. Favored geographic steatosis. Abnormal gallbladder appears abnormally heterogeneous, with combined hyperdensity and heterogeneity within the lumen including a discrete area of very low density (series 3, image 38) which is likely a lipid laden stones. Mildly indistinct gallbladder wall, but no obvious pericholecystic inflammation. However, evidence of gallstones at the neck of the gallbladder such as series 3, image 26, coronal image 64. Pancreas: Negative. Spleen: Negative.  No splenomegaly. Adrenals/Urinary Tract: Normal adrenal glands. Questionable punctate nephrolithiasis (such as right lower pole coronal image 48). But otherwise negative noncontrast kidneys. No hydronephrosis. Nondilated ureters. Diminutive bladder. Minimal pelvic phleboliths. Stomach/Bowel: Nondilated large and small bowel loops. Mild large bowel redundancy and retained stool. Normal gas containing appendix coronal image 67, tracking into the pelvis. Decompressed terminal ileum. Rectus muscle diastasis but no abdominal hernia. Decompressed stomach and duodenum. No pneumoperitoneum. No free fluid. No mesenteric inflammation identified. Vascular/Lymphatic: Negative noncontrast appearance. Reproductive: Negative noncontrast appearance. Other: No pelvis free fluid. Musculoskeletal: No acute osseous abnormality identified. Mild levoconvex lumbar scoliosis and mildly exaggerated lumbar lordosis. IMPRESSION: 1. Abnormal liver and  gallbladder: - enlarged Gallbladder with heterogeneous lumen likely with extensive sludge and stones. Evidence of a hyperdense stone at the neck of the gallbladder. Recommend Right Upper Quadrant Ultrasound to evaluate the possibility of acute cholecystitis. - Hepatomegaly, and heterogeneous liver, likely geographic steatosis. 2. No other acute or inflammatory process identified in the noncontrast abdomen or pelvis. Questionable punctate nephrolithiasis, but No obstructive uropathy. Normal appendix. Electronically Signed   By: VEAR Hurst M.D.   On: 06/01/2024 05:44      Consults: Case discussed with [facility].   Reassessment and Plan:   After reassessment of this patient, she has reproducible pain with palpation of the affected area as well as with rotation of the torso.  As such finding her acute pain is secondary to a muscular strain and the thoracic region.  As noted there is a incidental finding of gallbladder dilation with multiple stones and sludge.  Have consulted with our GI who will be following up with this patient as an outpatient visit.  Plan at this time is to discharge with outpatient prescription of celecoxib  for management of muscle strain.  Can continue to use acetaminophen  intermittently for pain that is not controlled with the prescribed medication.  As well as signs of remained stable, pain has been controlled, plan patient is stable for discharge at this time.  CT and ultrasound does not show any signs of gallbladder obstruction or acute cholecystitis at this time, so based on evaluation and consultation we will have her follow-up as an outpatient as previously noted.         Myriam Dorn BROCKS, [place] 06/01/24 [id number]    Levander Houston, MD 06/04/24 [phone]

## 2024-06-09 ENCOUNTER — Ambulatory Visit: Admitting: Gastroenterology

## 2024-06-09 NOTE — Progress Notes (Deleted)
 SABRA

## 2024-07-24 ENCOUNTER — Ambulatory Visit (INDEPENDENT_AMBULATORY_CARE_PROVIDER_SITE_OTHER)

## 2024-07-24 ENCOUNTER — Encounter (HOSPITAL_BASED_OUTPATIENT_CLINIC_OR_DEPARTMENT_OTHER): Payer: Self-pay

## 2024-07-24 ENCOUNTER — Ambulatory Visit (HOSPITAL_BASED_OUTPATIENT_CLINIC_OR_DEPARTMENT_OTHER)

## 2024-07-24 ENCOUNTER — Emergency Department (HOSPITAL_BASED_OUTPATIENT_CLINIC_OR_DEPARTMENT_OTHER)
Admission: EM | Admit: 2024-07-24 | Discharge: 2024-07-24 | Discharge status: Against Medical Advice | Attending: Emergency Medicine | Admitting: Emergency Medicine

## 2024-07-24 ENCOUNTER — Other Ambulatory Visit: Payer: Self-pay

## 2024-07-24 ENCOUNTER — Ambulatory Visit: Payer: Self-pay | Admitting: Nurse Practitioner

## 2024-07-24 ENCOUNTER — Ambulatory Visit
Admission: EM | Admit: 2024-07-24 | Discharge: 2024-07-24 | Disposition: A | Discharge status: Home / Self Care | Attending: Family Medicine | Admitting: Family Medicine

## 2024-07-24 DIAGNOSIS — R0602 Shortness of breath: Secondary | ICD-10-CM | POA: Diagnosis not present

## 2024-07-24 DIAGNOSIS — E1165 Type 2 diabetes mellitus with hyperglycemia: Secondary | ICD-10-CM

## 2024-07-24 DIAGNOSIS — R079 Chest pain, unspecified: Secondary | ICD-10-CM | POA: Insufficient documentation

## 2024-07-24 DIAGNOSIS — Z5321 Procedure and treatment not carried out due to patient leaving prior to being seen by health care provider: Secondary | ICD-10-CM | POA: Diagnosis not present

## 2024-07-24 DIAGNOSIS — R81 Glycosuria: Secondary | ICD-10-CM

## 2024-07-24 DIAGNOSIS — R739 Hyperglycemia, unspecified: Secondary | ICD-10-CM | POA: Diagnosis present

## 2024-07-24 DIAGNOSIS — N939 Abnormal uterine and vaginal bleeding, unspecified: Secondary | ICD-10-CM

## 2024-07-24 LAB — BASIC METABOLIC PANEL WITH GFR
Anion gap: 14 (ref 5–15)
BUN: 10 mg/dL (ref 6–20)
CO2: 21 mmol/L — ABNORMAL LOW (ref 22–32)
Calcium: 10 mg/dL (ref 8.9–10.3)
Chloride: 96 mmol/L — ABNORMAL LOW (ref 98–111)
Creatinine, Ser: 0.56 mg/dL (ref 0.44–1.00)
GFR, Estimated: >60 mL/min (ref >60)
Glucose, Bld: 222 mg/dL — ABNORMAL HIGH (ref 70–99)
Potassium: 3.9 mmol/L (ref 3.5–5.1)
Sodium: 131 mmol/L — ABNORMAL LOW (ref 135–145)

## 2024-07-24 LAB — POCT URINE DIPSTICK
Bilirubin, UA: NEGATIVE
Glucose, UA: >=1000 mg/dL — AB
Leukocytes, UA: NEGATIVE
Nitrite, UA: NEGATIVE
POC PROTEIN,UA: NEGATIVE
Spec Grav, UA: 1.02 (ref 1.010–1.025)
Urobilinogen, UA: 0.2 U/dL
pH, UA: 6 (ref 5.0–8.0)

## 2024-07-24 LAB — GLUCOSE, POCT (MANUAL RESULT ENTRY): POC Glucose: 345 mg/dL — AB (ref 70–99)

## 2024-07-24 LAB — CBC
HCT: 34.4 % — ABNORMAL LOW (ref 36.0–46.0)
Hemoglobin: 12.3 g/dL (ref 12.0–15.0)
MCH: 26.7 pg (ref 26.0–34.0)
MCHC: 35.8 g/dL (ref 30.0–36.0)
MCV: 74.6 fL — ABNORMAL LOW (ref 80.0–100.0)
Platelets: 401 K/uL — ABNORMAL HIGH (ref 150–400)
RBC: 4.61 MIL/uL (ref 3.87–5.11)
RDW: 13.1 % (ref 11.5–15.5)
WBC: 8.8 K/uL (ref 4.0–10.5)
nRBC: 0 % (ref 0.0–0.2)

## 2024-07-24 LAB — POCT URINE PREGNANCY: Preg Test, Ur: NEGATIVE

## 2024-07-24 LAB — TROPONIN T, HIGH SENSITIVITY: Troponin T High Sensitivity: <15 ng/L (ref 0–19)

## 2024-07-24 LAB — CBG MONITORING, ED: Glucose-Capillary: 234 mg/dL — ABNORMAL HIGH (ref 70–99)

## 2024-07-24 MED ORDER — ASPIRIN 81 MG PO CHEW
324.0000 mg | CHEWABLE_TABLET | Freq: Once | ORAL | Status: AC
  Administered 2024-07-24: 324 mg via ORAL

## 2024-07-24 NOTE — ED Triage Notes (Signed)
 Pt present with c/o intermittent vaginal bleeding x 10 days. Pt states yesterday she developed chest pain when breathing. States she is anemic and is on birth control. Reports bleeding after intercourse.

## 2024-07-24 NOTE — Discharge Instructions (Addendum)
 Please go to the emergency room for further evaluation of your chest pain, elevated blood sugars

## 2024-07-24 NOTE — ED Triage Notes (Signed)
 Pt advises UC sent her for further eval of hyperglycemia, CP, SHOB. CP/ SHOB onset 2 days ago. CP worse w palpation at [facility], now it just feels like discomfort.   4 baby ASA at UC  EKG, cxr at [facility] unremarkable

## 2024-07-24 NOTE — ED Provider Notes (Signed)
 [facility]    CSN: [id number] Arrival date & time: 07/24/24  1534      History   Chief Complaint Chief Complaint  Patient presents with   Vaginal Bleeding   Chest Pain    HPI Tiffany Hall is a 38 y.o. female presents for vaginal bleeding and chest pain.  Patient reports yesterday she developed some intermittent left-sided sharp chest pain that does not radiate.  She denies any shortness of breath, palpitations, syncope.  Does endorse some dizziness.  Pain is worse with deep breathing and better with rest.  No nausea/vomiting.  Denies any known injury to the chest wall or inciting event.  Denies asthma or smoking history.  Denies known family history of CAD.  No lower extremity swelling or orthopnea.  States symptoms resolved when she took her blood pressure medicine and an aspirin  yesterday.  She did not take either of those today.  In addition she reports 10 days of intermittent vaginal spotting.  She is on continuous birth control due to menorrhagia.  She states it gets a little heavier after intercourse but then returns to spotting.  She has been on the same birth control for about 3 years.  Denies any urinary symptoms, fevers chills or flank pain.  She has an appointment with her gynecologist coming up for evaluation.  Denies passing clots or soaking through a pad or tampon per hour.  No abdominal pain.  No other concerns at this time   Vaginal Bleeding Chest Pain   Past Medical History:  Diagnosis Date   Anemia    Anxiety    Depression    GERD (gastroesophageal reflux disease)    medication not needed   Gestational diabetes    insulin    Heart murmur    History of abnormal cervical Pap smear    2009   History of cardiac murmur as a child    History of chlamydia    2008   History of gallstones    2012   History of gonorrhea    2008   History of ovarian cyst    History of trichomonal vaginitis    2008   HSV (herpes simplex virus) anogenital infection     Hypertension    PIH with pregnancy   Migraine headache    Preeclampsia in postpartum period 01/15/2015   Pregnancy induced hypertension    Sickle cell trait    Vaginal Pap smear, abnormal    HPV +    Patient Active Problem List   Diagnosis Date Noted   Mild dysplasia of cervix (CIN I) 12/30/2022   History of bilateral tubal ligation 01/05/2021   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, antepartum, first trimester 09/09/2020   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   Migraine 02/25/2018    Past Surgical History:  Procedure Laterality Date   CESAREAN SECTION MULTI-GESTATIONAL N/A 01/05/2021   Procedure: CESAREAN SECTION MULTI-GESTATIONAL;  Surgeon: Alger Gong, MD;  Location: [facility];  Service: Obstetrics;  Laterality: N/A;   COLPOSCOPY     dilatation and currettage  N/A    DILATION AND CURETTAGE OF UTERUS  2009   W/  SUCTION --  required blood transfusion   DILATION AND EVACUATION N/A 09/14/2015   Procedure: DILATATION AND EVACUATION;  Surgeon: Duwaine Blumenthal, DO;  Location: [facility];  Service: Gynecology;  Laterality: N/A;    OB History     Gravida  8   Para  6   Term  6   Preterm  0   AB  2   Living  7      SAB  2   IAB  0   Ectopic  0   Multiple  1   Live Births  5            Home Medications    Prior to Admission medications   Medication Sig Start Date End Date Taking? Authorizing Provider  Accu-Chek Softclix Lancets lancets Use as instructed Patient not taking: Reported on 01/12/2021 08/04/20   Rudy Carlin LABOR, MD  albuterol  (VENTOLIN  HFA) 108 [phone] Base) MCG/ACT inhaler Inhale 2 puffs into the lungs every 6 (six) hours as needed for wheezing or shortness of breath. 12/21/22   Moishe Chiquita HERO, NP  baclofen  (LIORESAL ) 10 MG tablet Take 1 tablet (10 mg total) by mouth 3 (three) times daily. 05/30/24   Blair, Diane W, FNP  benzonatate  (TESSALON ) 200 MG capsule Take 1 capsule (200 mg total) by mouth 2 (two) times daily as  needed for cough. 05/18/23   Blair, Diane W, FNP  Blood Glucose Monitoring Suppl (ACCU-CHEK GUIDE) w/Device KIT 1 Device by Does not apply route 4 (four) times daily. Patient not taking: Reported on 01/12/2021 08/04/20   Rudy Carlin LABOR, MD  Blood Pressure Monitoring (BLOOD PRESSURE KIT) DEVI 1 kit by Does not apply route once a week. Check Blood Pressure regularly and record readings into the [employer] App.  Large Cuff.  DX O90.0 Patient not taking: Reported on 01/12/2021 07/06/20   Constant, Peggy, MD  celecoxib  (CELEBREX ) 100 MG capsule Take 1 capsule (100 mg total) by mouth 2 (two) times daily. 06/01/24   Myriam Dorn BROCKS, PA  cetirizine  (ZYRTEC  ALLERGY) 10 MG tablet Take 1 tablet (10 mg total) by mouth daily. 03/23/24   Christopher Savannah, PA-C  dicyclomine  (BENTYL ) 10 MG capsule Take 1 capsule (10 mg total) by mouth 4 (four) times daily -  before meals and at bedtime. 01/24/23   Vivienne Delon HERO, PA-C  ferrous sulfate  325 (65 FE) MG tablet Take 1 tablet (325 mg total) by mouth every other day. Patient taking differently: Take 325 mg by mouth daily. 01/08/21   Muus, Norleen RAMAN, MD  fluconazole  (DIFLUCAN ) 150 MG tablet Take 1 tablet (150 mg total) by mouth daily. 06/26/23   Gladis Elsie BROCKS, PA-C  fluticasone  (FLONASE ) 50 MCG/ACT nasal spray Place 2 sprays into both nostrils daily. 05/18/23   Blair, Diane W, FNP  glucose blood (ACCU-CHEK GUIDE) test strip Use to check blood sugars four times a day was instructed Patient not taking: Reported on 01/12/2021 08/04/20   Rudy Carlin LABOR, MD  ibuprofen  (ADVIL ) 800 MG tablet Take 1 tablet (800 mg total) by mouth every 8 (eight) hours as needed. 03/18/23   Vivienne Delon HERO, PA-C  JUNEL FE 1/20 1-20 MG-MCG tablet Take 1 tablet by mouth daily.    [provider]  naproxen  (NAPROSYN ) 500 MG tablet Take 1 tablet (500 mg total) by mouth 2 (two) times daily with a meal. 05/30/24   Almeda Loa ORN, FNP  ondansetron  (ZOFRAN ) 4 MG tablet Take 1 tablet (4 mg total)  by mouth every 8 (eight) hours as needed for nausea or vomiting. 04/16/23   Vivienne Delon HERO, PA-C  predniSONE  (DELTASONE ) 20 MG tablet Take 2 tablets daily with breakfast. 03/23/24   Christopher Savannah, PA-C  promethazine -dextromethorphan (PROMETHAZINE -DM) 6.25-15 MG/5ML syrup Take 5 mLs by mouth 3 (three) times daily as needed for cough. 03/23/24  Christopher Savannah, PA-C  sucralfate  (CARAFATE ) 1 g tablet Take 1 tablet (1 g total) by mouth 4 (four) times daily -  with meals and at bedtime for 7 days. 04/16/23 04/23/23  Vivienne Delon HERO, PA-C    Family History Family History  Problem Relation Age of Onset   Breast cancer Mother    Diabetes Mother    Hypertension Mother    Hepatitis Mother    Cancer Mother 12       Breast, cervical   Breast cancer Maternal Grandmother    Diabetes Maternal Grandmother    Hypertension Maternal Grandmother    Cancer Maternal Grandmother 26       Breast   Anesthesia problems Neg Hx    Other Neg Hx     Social History Social History   Tobacco Use   Smoking status: Former    Current packs/day: 0.00    Types: Cigarettes    Quit date: 11/05/2010    Years since quitting: 13.7   Smokeless tobacco: Never   Tobacco comments:    quit with preg  Vaping Use   Vaping status: Never Used  Substance Use Topics   Alcohol use: Not Currently    Comment: occ   Drug use: Not Currently    Types: MDMA (Ecstacy)     Allergies   Amoxicillin  and Nubain [nalbuphine hcl]   Review of Systems Review of Systems  Cardiovascular:  Positive for chest pain.  Genitourinary:  Positive for vaginal bleeding.     Physical Exam Triage Vital Signs ED Triage Vitals  Encounter Vitals Group     BP 07/24/24 1615 135/86     Girls Systolic BP Percentile --      Girls Diastolic BP Percentile --      Boys Systolic BP Percentile --      Boys Diastolic BP Percentile --      Pulse Rate 07/24/24 1615 99     Resp 07/24/24 1615 17     Temp 07/24/24 1615 99.2 F (37.3 C)     Temp Source  07/24/24 1615 Oral     SpO2 07/24/24 1615 95 %     Weight --      Height --      Head Circumference --      Peak Flow --      Pain Score 07/24/24 1614 3     Pain Loc --      Pain Education --      Exclude from Growth Chart --    No data found.  Updated Vital Signs BP 135/86   Pulse 99   Temp 99.2 F (37.3 C) (Oral)   Resp 17   LMP 07/14/2024 (Exact Date)   SpO2 95%   Visual Acuity Right Eye Distance:   Left Eye Distance:   Bilateral Distance:    Right Eye Near:   Left Eye Near:    Bilateral Near:     Physical Exam Vitals and nursing note reviewed.  Constitutional:      General: She is not in acute distress.    Appearance: Normal appearance. She is not ill-appearing.  HENT:     Head: Normocephalic and atraumatic.  Eyes:     Pupils: Pupils are equal, round, and reactive to light.  Cardiovascular:     Rate and Rhythm: Normal rate and regular rhythm.     Heart sounds: Normal heart sounds.  Pulmonary:     Effort: Pulmonary effort is normal.     Breath sounds:  Normal breath sounds.  Chest:     Chest wall: Tenderness present. No mass, lacerations, deformity, swelling, crepitus or edema.    Skin:    General: Skin is warm and dry.  Neurological:     General: No focal deficit present.     Mental Status: She is alert and oriented to person, place, and time.  Psychiatric:        Mood and Affect: Mood normal.        Behavior: Behavior normal.      UC Treatments / Results  Labs (all labs ordered are listed, but only abnormal results are displayed) Labs Reviewed  POCT URINE DIPSTICK - Abnormal; Notable for the following components:      Result Value   Glucose, UA >=1,000 (*)    Ketones, POC UA small (15) (*)    Blood, UA moderate (*)    All other components within normal limits  GLUCOSE, POCT (MANUAL RESULT ENTRY) - Abnormal; Notable for the following components:   POC Glucose 345 (*)    All other components within normal limits  POCT URINE PREGNANCY     EKG   Radiology No results found.  Procedures ED EKG  Date/Time: 07/24/2024 4:44 PM  Performed by: Loreda Myla SAUNDERS, NP Authorized by: Loreda Myla SAUNDERS, NP   ECG interpreted by ED Physician in the absence of a cardiologist: no   Rate:    ECG rate:  89 Rhythm:    Rhythm: sinus rhythm   Ectopy:    Ectopy: none   QRS:    QRS axis:  Normal ST segments:    ST segments:  Normal T waves:    T waves: normal   Q waves:    Abnormal Q-waves: not present    (including critical care time)  Medications Ordered in UC Medications  aspirin  chewable tablet 324 mg (324 mg Oral Given 07/24/24 1644)    Initial Impression / Assessment and Plan / UC Course  I have reviewed the triage vital signs and the nursing notes.  Pertinent labs & imaging results that were available during my care of the patient were reviewed by me and considered in my medical decision making (see chart for details).     I reviewed exam and symptoms with patient.  Urine negative for UTI but shows glucose, fingerstick blood sugar was 345.  Patient states she discussed which she Jardiance but has not started and currently takes metformin  intermittently.  Given her elevated blood sugars her chest pain and her vaginal bleeding advise she go to the ER for further evaluation.  She did state her chest pain somewhat improved after given aspirin .  Patient is in agreement plan will go POV to the emergency room.  She was instructed to pull over and call 911 for any worsening symptoms that occur in transit and she verbalized understanding Final Clinical Impressions(s) / UC Diagnoses   Final diagnoses:  Vaginal bleeding  Chest pain, unspecified type  Glucose found in urine on examination  Type 2 diabetes mellitus with hyperglycemia, without long-term current use of insulin  [facility])     Discharge Instructions      Please go to the emergency room for further evaluation of your chest pain, elevated blood sugars     ED  Prescriptions   None    PDMP not reviewed this encounter.   Loreda Myla SAUNDERS, NP 07/24/24 [phone]

## 2024-07-25 ENCOUNTER — Other Ambulatory Visit: Payer: Self-pay

## 2024-07-25 ENCOUNTER — Encounter (HOSPITAL_BASED_OUTPATIENT_CLINIC_OR_DEPARTMENT_OTHER): Payer: Self-pay | Admitting: Emergency Medicine

## 2024-07-25 ENCOUNTER — Emergency Department (HOSPITAL_BASED_OUTPATIENT_CLINIC_OR_DEPARTMENT_OTHER)

## 2024-07-25 ENCOUNTER — Emergency Department (HOSPITAL_BASED_OUTPATIENT_CLINIC_OR_DEPARTMENT_OTHER)
Admission: EM | Admit: 2024-07-25 | Discharge: 2024-07-25 | Disposition: A | Discharge status: Home / Self Care | Attending: Emergency Medicine | Admitting: Emergency Medicine

## 2024-07-25 DIAGNOSIS — Z7984 Long term (current) use of oral hypoglycemic drugs: Secondary | ICD-10-CM | POA: Diagnosis not present

## 2024-07-25 DIAGNOSIS — E1165 Type 2 diabetes mellitus with hyperglycemia: Secondary | ICD-10-CM | POA: Diagnosis not present

## 2024-07-25 DIAGNOSIS — Z79899 Other long term (current) drug therapy: Secondary | ICD-10-CM | POA: Insufficient documentation

## 2024-07-25 DIAGNOSIS — R42 Dizziness and giddiness: Secondary | ICD-10-CM

## 2024-07-25 DIAGNOSIS — I10 Essential (primary) hypertension: Secondary | ICD-10-CM | POA: Insufficient documentation

## 2024-07-25 DIAGNOSIS — R739 Hyperglycemia, unspecified: Secondary | ICD-10-CM | POA: Diagnosis present

## 2024-07-25 LAB — BETA-HYDROXYBUTYRIC ACID: Beta-Hydroxybutyric Acid: 0.37 mmol/L — ABNORMAL HIGH (ref 0.05–0.27)

## 2024-07-25 LAB — CBC WITH DIFFERENTIAL/PLATELET
Abs Immature Granulocytes: 0.01 K/uL (ref 0.00–0.07)
Basophils Absolute: 0.1 K/uL (ref 0.0–0.1)
Basophils Relative: 1 %
Eosinophils Absolute: 0.3 K/uL (ref 0.0–0.5)
Eosinophils Relative: 4 %
HCT: 33.5 % — ABNORMAL LOW (ref 36.0–46.0)
Hemoglobin: 11.9 g/dL — ABNORMAL LOW (ref 12.0–15.0)
Immature Granulocytes: 0 %
Lymphocytes Relative: 36 %
Lymphs Abs: 2.6 K/uL (ref 0.7–4.0)
MCH: 26.5 pg (ref 26.0–34.0)
MCHC: 35.5 g/dL (ref 30.0–36.0)
MCV: 74.6 fL — ABNORMAL LOW (ref 80.0–100.0)
Monocytes Absolute: 0.5 K/uL (ref 0.1–1.0)
Monocytes Relative: 7 %
Neutro Abs: 3.7 K/uL (ref 1.7–7.7)
Neutrophils Relative %: 52 %
Platelets: 364 K/uL (ref 150–400)
RBC: 4.49 MIL/uL (ref 3.87–5.11)
RDW: 13.2 % (ref 11.5–15.5)
WBC: 7.1 K/uL (ref 4.0–10.5)
nRBC: 0 % (ref 0.0–0.2)

## 2024-07-25 LAB — I-STAT VENOUS BLOOD GAS, ED
Acid-base deficit: 4 mmol/L — ABNORMAL HIGH (ref 0.0–2.0)
Bicarbonate: 21.3 mmol/L (ref 20.0–28.0)
Calcium, Ion: 1.23 mmol/L (ref 1.15–1.40)
HCT: 33 % — ABNORMAL LOW (ref 36.0–46.0)
Hemoglobin: 11.2 g/dL — ABNORMAL LOW (ref 12.0–15.0)
O2 Saturation: 68 %
Potassium: 3.9 mmol/L (ref 3.5–5.1)
Sodium: 133 mmol/L — ABNORMAL LOW (ref 135–145)
TCO2: 22 mmol/L (ref 22–32)
pCO2, Ven: 37.8 mmHg — ABNORMAL LOW (ref 44–60)
pH, Ven: 7.359 (ref 7.25–7.43)
pO2, Ven: 36 mmHg (ref 32–45)

## 2024-07-25 LAB — CBG MONITORING, ED
Glucose-Capillary: 271 mg/dL — ABNORMAL HIGH (ref 70–99)
Glucose-Capillary: 250 mg/dL — ABNORMAL HIGH (ref 70–99)

## 2024-07-25 LAB — BASIC METABOLIC PANEL WITH GFR
Anion gap: 13 (ref 5–15)
BUN: 10 mg/dL (ref 6–20)
CO2: 22 mmol/L (ref 22–32)
Calcium: 9.6 mg/dL (ref 8.9–10.3)
Chloride: 97 mmol/L — ABNORMAL LOW (ref 98–111)
Creatinine, Ser: 0.54 mg/dL (ref 0.44–1.00)
GFR, Estimated: >60 mL/min (ref >60)
Glucose, Bld: 285 mg/dL — ABNORMAL HIGH (ref 70–99)
Potassium: 4.2 mmol/L (ref 3.5–5.1)
Sodium: 133 mmol/L — ABNORMAL LOW (ref 135–145)

## 2024-07-25 LAB — TROPONIN T, HIGH SENSITIVITY: Troponin T High Sensitivity: <15 ng/L (ref 0–19)

## 2024-07-25 LAB — HEPATIC FUNCTION PANEL
ALT: 53 U/L — ABNORMAL HIGH (ref 0–44)
AST: 57 U/L — ABNORMAL HIGH (ref 15–41)
Albumin: 4 g/dL (ref 3.5–5.0)
Alkaline Phosphatase: 76 U/L (ref 38–126)
Bilirubin, Direct: 0.1 mg/dL (ref 0.0–0.2)
Indirect Bilirubin: 0.2 mg/dL — ABNORMAL LOW (ref 0.3–0.9)
Total Bilirubin: 0.3 mg/dL (ref 0.0–1.2)
Total Protein: 6.8 g/dL (ref 6.5–8.1)

## 2024-07-25 LAB — D-DIMER, QUANTITATIVE: D-Dimer, Quant: <0.27 ug{FEU}/mL (ref 0.00–0.50)

## 2024-07-25 LAB — HCG, SERUM, QUALITATIVE: Preg, Serum: NEGATIVE

## 2024-07-25 LAB — PRO BRAIN NATRIURETIC PEPTIDE: Pro Brain Natriuretic Peptide: <50 pg/mL (ref <300.0)

## 2024-07-25 MED ORDER — SODIUM CHLORIDE 0.9 % IV BOLUS
1000.0000 mL | Freq: Once | INTRAVENOUS | Status: AC
  Administered 2024-07-25: 1000 mL via INTRAVENOUS

## 2024-07-25 NOTE — ED Notes (Addendum)
 VBG results given to Dr. Jerrol.

## 2024-07-25 NOTE — ED Triage Notes (Signed)
 Pt endorses dizziness overnight. Denies HA, states my head is foggy. Tx last night for hyperglycemia

## 2024-07-25 NOTE — ED Notes (Signed)
CBG 271  

## 2024-07-25 NOTE — Discharge Instructions (Addendum)
 Your EKG, chest x-ray and laboratory evaluation was overall reassuring.  You were found to be hyperglycemic but not in diabetic ketoacidosis.  Your blood sugars were downtrending after fluids.  Recommend you follow-up with your primary care provider for continued outpatient management.

## 2024-07-25 NOTE — ED Provider Notes (Signed)
 [place] EMERGENCY DEPARTMENT AT [facility] Provider Note   CSN: [id number] Arrival date & time: 07/25/24  0700     Patient presents with: Dizziness   Tiffany Hall is a 38 y.o. female.    Dizziness    38 year old female with medical history significant for gallstones, GERD, HTN, anxiety, depression, DM2 on metformin  presenting to the emergency department with multiple complaints.  The patient initially was seen at urgent care yesterday.  The patient had been complaining of ongoing vaginal bleeding for which she is taking estrogen-containing birth control pills.  She had complained of sharp left-sided chest pain with some pressure sensation.  She was sent to the emergency department for further evaluation but left after triage last night.  Her blood glucose was also high.  She does not regularly take her blood glucose at home.  She had previously been on metformin  but had been having diarrhea and was being switched to [facility] outpatient but has not started the medication yet.  She denies any abdominal pain, nausea or vomiting.  She states that her chest pain and shortness of breath has resolved.  It had been ongoing for the last 2 days.  She had describes it as sharp with associated dry cough.  She denies any lower extremity swelling or crampy discomfort.  No history of DVT or PE.  She has had ongoing vaginal bleeding and feels fatigued and lightheaded. No room spinning dizziness. She denies any urinary symptoms.  She has an upcoming appointment with her gynecologist for further evaluation of this.  Prior to Admission medications   Medication Sig Start Date End Date Taking? Authorizing Provider  Accu-Chek Softclix Lancets lancets Use as instructed Patient not taking: Reported on 01/12/2021 08/04/20   Rudy Carlin LABOR, MD  albuterol  (VENTOLIN  HFA) [id number] Base) MCG/ACT inhaler Inhale 2 puffs into the lungs every 6 (six) hours as needed for wheezing or shortness of breath.  12/21/22   Moishe Chiquita HERO, NP  baclofen  (LIORESAL ) 10 MG tablet Take 1 tablet (10 mg total) by mouth 3 (three) times daily. 05/30/24   Blair, Diane W, FNP  benzonatate  (TESSALON ) 200 MG capsule Take 1 capsule (200 mg total) by mouth 2 (two) times daily as needed for cough. 05/18/23   Blair, Diane W, FNP  Blood Glucose Monitoring Suppl (ACCU-CHEK GUIDE) w/Device KIT 1 Device by Does not apply route 4 (four) times daily. Patient not taking: Reported on 01/12/2021 08/04/20   Rudy Carlin LABOR, MD  Blood Pressure Monitoring (BLOOD PRESSURE KIT) DEVI 1 kit by Does not apply route once a week. Check Blood Pressure regularly and record readings into the Babys[employer] App.  Large Cuff.  DX O90.0 Patient not taking: Reported on 01/12/2021 07/06/20   Constant, Peggy, MD  celecoxib  (CELEBREX ) 100 MG capsule Take 1 capsule (100 mg total) by mouth 2 (two) times daily. 06/01/24   Myriam Dorn BROCKS, PA  cetirizine  (ZYRTEC  ALLERGY) 10 MG tablet Take 1 tablet (10 mg total) by mouth daily. 03/23/24   Christopher Savannah, PA-C  dicyclomine  (BENTYL ) 10 MG capsule Take 1 capsule (10 mg total) by mouth 4 (four) times daily -  before meals and at bedtime. 01/24/23   Vivienne Delon HERO, PA-C  ferrous sulfate  325 (65 FE) MG tablet Take 1 tablet (325 mg total) by mouth every other day. Patient taking differently: Take 325 mg by mouth daily. 01/08/21   Muus, Norleen RAMAN, MD  fluconazole  (DIFLUCAN ) 150 MG tablet Take 1 tablet (150 mg total) by mouth  daily. 06/26/23   Gladis Elsie BROCKS, PA-C  fluticasone  (FLONASE ) 50 MCG/ACT nasal spray Place 2 sprays into both nostrils daily. 05/18/23   Blair, Diane W, FNP  glucose blood (ACCU-CHEK GUIDE) test strip Use to check blood sugars four times a day was instructed Patient not taking: Reported on 01/12/2021 08/04/20   Rudy Carlin LABOR, MD  ibuprofen  (ADVIL ) 800 MG tablet Take 1 tablet (800 mg total) by mouth every 8 (eight) hours as needed. 03/18/23   Vivienne Delon HERO, PA-C  JUNEL FE 1/20 1-20 MG-MCG tablet  Take 1 tablet by mouth daily.    [provider]  naproxen  (NAPROSYN ) 500 MG tablet Take 1 tablet (500 mg total) by mouth 2 (two) times daily with a meal. 05/30/24   Almeda Loa ORN, FNP  ondansetron  (ZOFRAN ) 4 MG tablet Take 1 tablet (4 mg total) by mouth every 8 (eight) hours as needed for nausea or vomiting. 04/16/23   Vivienne Delon HERO, PA-C  predniSONE  (DELTASONE ) 20 MG tablet Take 2 tablets daily with breakfast. 03/23/24   Christopher Savannah, PA-C  promethazine -dextromethorphan (PROMETHAZINE -DM) 6.25-15 MG/5ML syrup Take 5 mLs by mouth 3 (three) times daily as needed for cough. 03/23/24   Christopher Savannah, PA-C  sucralfate  (CARAFATE ) 1 g tablet Take 1 tablet (1 g total) by mouth 4 (four) times daily -  with meals and at bedtime for 7 days. 04/16/23 04/23/23  Vivienne Delon HERO, PA-C    Allergies: Amoxicillin  and Nubain [nalbuphine hcl]    Review of Systems  Neurological:  Positive for dizziness.  All other systems reviewed and are negative.   Updated Vital Signs BP 115/81   Pulse 63   Temp 98.3 F (36.8 C)   Resp 16   Wt 90.7 kg   LMP 07/14/2024 (Exact Date)   SpO2 99%   BMI 34.87 kg/m   Physical Exam Vitals and nursing note reviewed.  Constitutional:      General: She is not in acute distress.    Appearance: She is well-developed.  HENT:     Head: Normocephalic and atraumatic.  Eyes:     Conjunctiva/sclera: Conjunctivae normal.  Cardiovascular:     Rate and Rhythm: Normal rate and regular rhythm.     Heart sounds: No murmur heard. Pulmonary:     Effort: Pulmonary effort is normal. No respiratory distress.     Breath sounds: Normal breath sounds.  Abdominal:     Palpations: Abdomen is soft.     Tenderness: There is no abdominal tenderness.  Musculoskeletal:        General: No swelling.     Cervical back: Neck supple.  Skin:    General: Skin is warm and dry.     Capillary Refill: Capillary refill takes less than 2 seconds.  Neurological:     General: No focal deficit  present.     Mental Status: She is alert and oriented to person, place, and time. Mental status is at baseline.     Cranial Nerves: No cranial nerve deficit.     Sensory: No sensory deficit.     Motor: No weakness.  Psychiatric:        Mood and Affect: Mood normal.     (all labs ordered are listed, but only abnormal results are displayed) Labs Reviewed  CBC WITH DIFFERENTIAL/PLATELET - Abnormal; Notable for the following components:      Result Value   Hemoglobin 11.9 (*)    HCT 33.5 (*)    MCV 74.6 (*)  All other components within normal limits  BASIC METABOLIC PANEL WITH GFR - Abnormal; Notable for the following components:   Sodium 133 (*)    Chloride 97 (*)    Glucose, Bld 285 (*)    All other components within normal limits  BETA-HYDROXYBUTYRIC ACID - Abnormal; Notable for the following components:   Beta-Hydroxybutyric Acid 0.37 (*)    All other components within normal limits  HEPATIC FUNCTION PANEL - Abnormal; Notable for the following components:   AST 57 (*)    ALT 53 (*)    Indirect Bilirubin 0.2 (*)    All other components within normal limits  CBG MONITORING, ED - Abnormal; Notable for the following components:   Glucose-Capillary 271 (*)    All other components within normal limits  I-STAT VENOUS BLOOD GAS, ED - Abnormal; Notable for the following components:   pCO2, Ven 37.8 (*)    Acid-base deficit 4.0 (*)    Sodium 133 (*)    HCT 33.0 (*)    Hemoglobin 11.2 (*)    All other components within normal limits  CBG MONITORING, ED - Abnormal; Notable for the following components:   Glucose-Capillary 250 (*)    All other components within normal limits  D-DIMER, QUANTITATIVE  PRO BRAIN NATRIURETIC PEPTIDE  HCG, SERUM, QUALITATIVE  TROPONIN T, HIGH SENSITIVITY    EKG: EKG Interpretation Date/Time:  Saturday July 25 2024 07:21:43 EDT Ventricular Rate:  66 PR Interval:  199 QRS Duration:  86 QT Interval:  369 QTC Calculation: 387 R  Axis:   71  Text Interpretation: Sinus rhythm Consider left ventricular hypertrophy ST elev, probable normal early repol pattern Confirmed by Jerrol Agent ([id number]) on 07/25/2024 8:05:59 AM  Radiology: [facility] Chest Portable 1 View Result Date: 07/25/2024 CLINICAL DATA:  Chest pain and dizziness. EXAM: PORTABLE CHEST 1 VIEW COMPARISON:  07/24/2024 FINDINGS: The lungs are clear without focal pneumonia, edema, pneumothorax or pleural effusion. Cardiopericardial silhouette is at upper limits of normal for size. No acute bony abnormality. Telemetry leads overlie the chest. IMPRESSION: No active disease. Electronically Signed   By: Camellia Candle M.D.   On: 07/25/2024 08:18   DG Chest 2 View Result Date: 07/24/2024 CLINICAL DATA:  Chest pain EXAM: CHEST - 2 VIEW COMPARISON:  06/01/2024 FINDINGS: The heart size and mediastinal contours are within normal limits. Both lungs are clear. The visualized skeletal structures are unremarkable. IMPRESSION: No active cardiopulmonary disease. Electronically Signed   By: Ozell Daring M.D.   On: 07/24/2024 17:41     Procedures   Medications Ordered in the ED  sodium chloride  0.9 % bolus 1,000 mL (0 mLs Intravenous Stopped 07/25/24 0937)    Clinical Course as of 07/25/24 9062  Sat Jul 25, 2024  0722 Glucose-Capillary(!): 271 [JL]    Clinical Course User Index [JL] Jerrol Agent, MD                                 Medical Decision Making Amount and/or Complexity of Data Reviewed Labs: ordered. Decision-making details documented in ED Course. Radiology: ordered.    38 year old female with medical history significant for gallstones, GERD, HTN, anxiety, depression, DM2 on metformin  presenting to the emergency department with multiple complaints.  The patient initially was seen at urgent care yesterday.  The patient had been complaining of ongoing vaginal bleeding for which she is taking estrogen-containing birth control pills.  She had complained of sharp  left-sided  chest pain with some pressure sensation.  She was sent to the emergency department for further evaluation but left after triage last night.  Her blood glucose was also high.  She does not regularly take her blood glucose at home.  She had previously been on metformin  but had been having diarrhea and was being switched to [facility] outpatient but has not started the medication yet.  She denies any abdominal pain, nausea or vomiting.  She states that her chest pain and shortness of breath has resolved.  It had been ongoing for the last 2 days.  She had describes it as sharp with associated dry cough.  She denies any lower extremity swelling or crampy discomfort.  No history of DVT or PE.  She has had ongoing vaginal bleeding and feels fatigued and lightheaded. No room spinning dizziness. She denies any urinary symptoms.  She has an upcoming appointment with her gynecologist for further evaluation of this.  On arrival, the patient was afebrile, not tachycardic or tachypneic, hemodynamically stable, saturating well on room air.  Patient presenting with concern for hyperglycemia in the setting of diabetes mellitus, some lightheadedness.  Had been complaining of chest pain and shortness of breath which has since resolved.  No other complaints at this time.  No abdominal pain, nausea or vomiting, no diarrhea.  She denies room sitting dizziness.  Her neuroexam is normal.  Initial EKG had poor baseline, repeat EKG showed early repolarization, no acute ischemic changes or abnormal intervals, sinus rhythm, ventricular rate 6 6.  Chest x-ray revealed no active disease.  Laboratory evaluation revealed D-dimer normal, cardiac troponin and BNP normal.  Low concern for PE with a negative D-dimer.  CBC without a significant anemia, mild anemia noted with a hemoglobin of 11.9, no leukocytosis, BMP with hyperglycemia without an anion gap acidosis, blood glucose 285, anion gap 13 and bicarbonate 22.  Hepatic function  panel revealed mildly elevated LFTs with an AST of 57 and ALT of 53 however the patient denies any right upper quadrant pain and is not tender on exam.  hCG was negative.  A VBG revealed no significant acidosis with a pH of 7.36.  Patient without evidence of DKA, presenting with hyperglycemia the setting of diabetes mellitus, administered an IV fluid bolus.  Orthostatics performed and resulted negative.  EKG without acute ischemic changes.  Early repolarization noted.  Chest x-ray was clear.  Cardiac enzymes normal and patient without chest pain or discomfort at this time.  CBG appropriately downtrending after fluids, 250 on recheck, patient scheduled to start Jardiance, okay to start at this time and continue to follow-up outpatient with her PCP.  She was feeling symptomatically improved on repeat assessment, overall stable for discharge at this time.     Final diagnoses:  Hyperglycemia due to diabetes mellitus [facility])  Lightheadedness    ED Discharge Orders     None          Jerrol Agent, MD 07/25/24 [id number]

## 2024-07-31 ENCOUNTER — Other Ambulatory Visit: Payer: Self-pay

## 2024-07-31 ENCOUNTER — Emergency Department (HOSPITAL_BASED_OUTPATIENT_CLINIC_OR_DEPARTMENT_OTHER)
Admission: EM | Admit: 2024-07-31 | Discharge: 2024-07-31 | Disposition: A | Discharge status: Home / Self Care | Attending: Emergency Medicine | Admitting: Emergency Medicine

## 2024-07-31 ENCOUNTER — Encounter (HOSPITAL_BASED_OUTPATIENT_CLINIC_OR_DEPARTMENT_OTHER): Payer: Self-pay | Admitting: Emergency Medicine

## 2024-07-31 DIAGNOSIS — R252 Cramp and spasm: Secondary | ICD-10-CM | POA: Insufficient documentation

## 2024-07-31 DIAGNOSIS — Z7984 Long term (current) use of oral hypoglycemic drugs: Secondary | ICD-10-CM | POA: Diagnosis not present

## 2024-07-31 DIAGNOSIS — R739 Hyperglycemia, unspecified: Secondary | ICD-10-CM

## 2024-07-31 DIAGNOSIS — E1165 Type 2 diabetes mellitus with hyperglycemia: Secondary | ICD-10-CM | POA: Insufficient documentation

## 2024-07-31 LAB — CBG MONITORING, ED: Glucose-Capillary: 266 mg/dL — ABNORMAL HIGH (ref 70–99)

## 2024-07-31 LAB — BASIC METABOLIC PANEL WITH GFR
Anion gap: 13 (ref 5–15)
BUN: 12 mg/dL (ref 6–20)
CO2: 21 mmol/L — ABNORMAL LOW (ref 22–32)
Calcium: 9.7 mg/dL (ref 8.9–10.3)
Chloride: 99 mmol/L (ref 98–111)
Creatinine, Ser: 0.54 mg/dL (ref 0.44–1.00)
GFR, Estimated: >60 mL/min (ref >60)
Glucose, Bld: 239 mg/dL — ABNORMAL HIGH (ref 70–99)
Potassium: 4.3 mmol/L (ref 3.5–5.1)
Sodium: 133 mmol/L — ABNORMAL LOW (ref 135–145)

## 2024-07-31 LAB — CBC WITH DIFFERENTIAL/PLATELET
Abs Immature Granulocytes: 0.01 K/uL (ref 0.00–0.07)
Basophils Absolute: 0.1 K/uL (ref 0.0–0.1)
Basophils Relative: 1 %
Eosinophils Absolute: 0.2 K/uL (ref 0.0–0.5)
Eosinophils Relative: 3 %
HCT: 36.8 % (ref 36.0–46.0)
Hemoglobin: 12.8 g/dL (ref 12.0–15.0)
Immature Granulocytes: 0 %
Lymphocytes Relative: 37 %
Lymphs Abs: 2.7 K/uL (ref 0.7–4.0)
MCH: 26 pg (ref 26.0–34.0)
MCHC: 34.8 g/dL (ref 30.0–36.0)
MCV: 74.8 fL — ABNORMAL LOW (ref 80.0–100.0)
Monocytes Absolute: 0.5 K/uL (ref 0.1–1.0)
Monocytes Relative: 7 %
Neutro Abs: 3.9 K/uL (ref 1.7–7.7)
Neutrophils Relative %: 52 %
Platelets: 455 K/uL — ABNORMAL HIGH (ref 150–400)
RBC: 4.92 MIL/uL (ref 3.87–5.11)
RDW: 13.4 % (ref 11.5–15.5)
WBC: 7.4 K/uL (ref 4.0–10.5)
nRBC: 0 % (ref 0.0–0.2)

## 2024-07-31 LAB — CK: Total CK: 77 U/L (ref 38–234)

## 2024-07-31 LAB — MAGNESIUM: Magnesium: 1.8 mg/dL (ref 1.7–2.4)

## 2024-07-31 NOTE — ED Provider Notes (Signed)
 [place] EMERGENCY DEPARTMENT AT [facility] Provider Note   CSN: [id number] Arrival date & time: 07/31/24  1430     Patient presents with: Leg Cramps   Tiffany Hall is a 38 y.o. female with past medical history of diabetes who presents to the emergency department for evaluation of bilateral leg cramping.  Patient states she was previously taking metformin  for her diabetes and her PCP changed her to [facility] over the last week.  However, patient had intolerable side effects so she restarted metformin  yesterday.  Upon restarting the medication, she began to experience the bilateral leg pain and cramping.  Patient states this occurred when she took metformin  in the past as well.  She also reports diarrhea, which started when she resumed the metformin .  HPI     Prior to Admission medications   Medication Sig Start Date End Date Taking? Authorizing Provider  Accu-Chek Softclix Lancets lancets Use as instructed Patient not taking: Reported on 01/12/2021 08/04/20   Rudy Carlin LABOR, MD  albuterol  (VENTOLIN  HFA) 108 (90 Base) MCG/ACT inhaler Inhale 2 puffs into the lungs every 6 (six) hours as needed for wheezing or shortness of breath. 12/21/22   Moishe Chiquita HERO, NP  baclofen  (LIORESAL ) 10 MG tablet Take 1 tablet (10 mg total) by mouth 3 (three) times daily. 05/30/24   Blair, Diane W, FNP  benzonatate  (TESSALON ) 200 MG capsule Take 1 capsule (200 mg total) by mouth 2 (two) times daily as needed for cough. 05/18/23   Blair, Diane W, FNP  Blood Glucose Monitoring Suppl (ACCU-CHEK GUIDE) w/Device KIT 1 Device by Does not apply route 4 (four) times daily. Patient not taking: Reported on 01/12/2021 08/04/20   Rudy Carlin LABOR, MD  Blood Pressure Monitoring (BLOOD PRESSURE KIT) DEVI 1 kit by Does not apply route once a week. Check Blood Pressure regularly and record readings into the [employer] App.  Large Cuff.  DX O90.0 Patient not taking: Reported on 01/12/2021 07/06/20   Constant,  Peggy, MD  celecoxib  (CELEBREX ) 100 MG capsule Take 1 capsule (100 mg total) by mouth 2 (two) times daily. 06/01/24   Myriam Dorn BROCKS, PA  cetirizine  (ZYRTEC  ALLERGY) 10 MG tablet Take 1 tablet (10 mg total) by mouth daily. 03/23/24   Christopher Savannah, PA-C  dicyclomine  (BENTYL ) 10 MG capsule Take 1 capsule (10 mg total) by mouth 4 (four) times daily -  before meals and at bedtime. 01/24/23   Vivienne Delon HERO, PA-C  ferrous sulfate  325 (65 FE) MG tablet Take 1 tablet (325 mg total) by mouth every other day. Patient taking differently: Take 325 mg by mouth daily. 01/08/21   Muus, Norleen RAMAN, MD  fluconazole  (DIFLUCAN ) 150 MG tablet Take 1 tablet (150 mg total) by mouth daily. 06/26/23   Gladis Elsie BROCKS, PA-C  fluticasone  (FLONASE ) 50 MCG/ACT nasal spray Place 2 sprays into both nostrils daily. 05/18/23   Blair, Diane W, FNP  glucose blood (ACCU-CHEK GUIDE) test strip Use to check blood sugars four times a day was instructed Patient not taking: Reported on 01/12/2021 08/04/20   Rudy Carlin LABOR, MD  ibuprofen  (ADVIL ) 800 MG tablet Take 1 tablet (800 mg total) by mouth every 8 (eight) hours as needed. 03/18/23   Vivienne Delon HERO, PA-C  JUNEL FE 1/20 1-20 MG-MCG tablet Take 1 tablet by mouth daily.    [provider]  naproxen  (NAPROSYN ) 500 MG tablet Take 1 tablet (500 mg total) by mouth 2 (two) times daily with a meal. 05/30/24  Blair, Diane W, FNP  ondansetron  (ZOFRAN ) 4 MG tablet Take 1 tablet (4 mg total) by mouth every 8 (eight) hours as needed for nausea or vomiting. 04/16/23   Vivienne Delon HERO, PA-C  predniSONE  (DELTASONE ) 20 MG tablet Take 2 tablets daily with breakfast. 03/23/24   Christopher Savannah, PA-C  promethazine -dextromethorphan (PROMETHAZINE -DM) 6.25-15 MG/5ML syrup Take 5 mLs by mouth 3 (three) times daily as needed for cough. 03/23/24   Christopher Savannah, PA-C  sucralfate  (CARAFATE ) 1 g tablet Take 1 tablet (1 g total) by mouth 4 (four) times daily -  with meals and at bedtime for 7 days.  04/16/23 04/23/23  Vivienne Delon HERO, PA-C    Allergies: Amoxicillin  and Nubain [nalbuphine hcl]    Review of Systems  Musculoskeletal:  Positive for myalgias.    Updated Vital Signs BP 128/86 (BP Location: Right Arm)   Pulse 95   Temp 98.3 F (36.8 C)   Resp 18   Ht 5' 3.5 (1.613 m)   Wt 90.7 kg   LMP 07/14/2024 (Exact Date)   SpO2 99%   BMI 34.87 kg/m   Physical Exam Vitals and nursing note reviewed.  Constitutional:      Appearance: Normal appearance. She is not ill-appearing.  Eyes:     General: No scleral icterus. Pulmonary:     Effort: Pulmonary effort is normal. No respiratory distress.  Musculoskeletal:        General: No swelling or deformity. Normal range of motion.     Comments: No evidence of lower extremity edema.  Strength intact 5 out of 5 on bilateral lower extremities.  Range of motion on the right is minimally restricted due to pain.  Sensation intact bilaterally.  DP pulses intact bilaterally.  No unilateral leg swelling.  Skin:    Coloration: Skin is not jaundiced.  Neurological:     General: No focal deficit present.     Mental Status: She is alert.  Psychiatric:        Mood and Affect: Mood normal.     (all labs ordered are listed, but only abnormal results are displayed) Labs Reviewed  CBC WITH DIFFERENTIAL/PLATELET - Abnormal; Notable for the following components:      Result Value   MCV 74.8 (*)    Platelets 455 (*)    All other components within normal limits  BASIC METABOLIC PANEL WITH GFR - Abnormal; Notable for the following components:   Sodium 133 (*)    CO2 21 (*)    Glucose, Bld 239 (*)    All other components within normal limits  CBG MONITORING, ED - Abnormal; Notable for the following components:   Glucose-Capillary 266 (*)    All other components within normal limits  CK  MAGNESIUM     EKG: None  Radiology: No results found.  Procedures   Medications Ordered in the ED - No data to display                                Medical Decision Making Amount and/or Complexity of Data Reviewed Labs: ordered.   This patient presents to the ED for concern of leg cramping, this involves an extensive number of treatment options, and is a complaint that carries with it a high risk of complications and morbidity.   Differential diagnosis includes: Electrolyte abnormality, rhabdomyolysis, DVT, compartment syndrome, muscle strain  Co morbidities:  diabetes type 2   Additional history:  Patient had a  telehealth visit yesterday with her PCP and reported that she was experiencing nausea with Jardiance and that she prefers metformin  despite diarrhea.  Patient restarted metformin  500 mg twice a day.  Lab Tests:  I Ordered, and personally interpreted labs.  The pertinent results include: No acute abnormalities to explain patient's symptoms  Imaging Studies: Not indicated  Medicines ordered and prescription drug management: No medication indicated at this time  Test Considered:   none  Critical Interventions:   none  Consultations Obtained: None  Problem List / ED Course:     ICD-10-CM   1. Bilateral leg cramps  R25.2     2. Hyperglycemia  R73.9       MDM: 38 year old female who presents to the emergency department for evaluation of bilateral leg cramping.  Patient states the leg cramps started last night and prevented her from sleeping.  She is currently able to ambulate without difficulty.  No evidence of unilateral leg swelling to suggest DVT.  Strong DP pulse.  I ordered a CBC and BMP to rule out any electrolyte abnormalities.  These results were unremarkable.  CK is 77 and patient has had no recent falls or trauma to her legs so unlikely rhabdo.  Magnesium  is 1.8.  I suspect patient's symptoms are likely secondary to restarting her metformin  that she takes for her diabetes.  Patient stated when she started metformin  in the past, the same thing happened but it improved with time.  I advised the  patient to follow-up with her PCP if her symptoms do not improve in the next 2-3 days.  She verbalized her understanding.  Patient education provided on discharge paperwork as well.  Patient's vitals are stable.  She is stable for discharge at this time.   Dispostion:  After consideration of the diagnostic results and the patients response to treatment, I feel that the patient would benefit from follow-up with her PCP.   Final diagnoses:  Bilateral leg cramps  Hyperglycemia    ED Discharge Orders     None          Torrence Marry GORMAN DEVONNA 07/31/24 1748    Lenor Hollering, MD 07/31/24 2252

## 2024-07-31 NOTE — Discharge Instructions (Signed)
 It was a pleasure taking care of you today. You were seen in the Emergency Department for leg cramping and pain. Your work-up was reassuring. Your labs show no electrolyte abnormality that would explain your symptoms and your physical exam showed normal pulses in your foot and no evidence of swelling.  I suspect your symptoms are likely due to restarting metformin .  Refer to the attached documentation for further management of your symptoms. Follow up with your PCP if your symptoms do not improve by Monday.  Please return to the ER if you experience chest pain, trouble breathing, intractable nausea/vomiting or any other life threatening illnesses.

## 2024-07-31 NOTE — ED Triage Notes (Signed)
 Pt sts she was seen last week for hyperglycemia; recently had medications adjusted; reports leg cramps and couldn't walk last night; amb to triage w/o difficulty

## 2024-08-01 ENCOUNTER — Telehealth: Admitting: Physician Assistant

## 2024-08-01 DIAGNOSIS — B3731 Acute candidiasis of vulva and vagina: Secondary | ICD-10-CM | POA: Diagnosis not present

## 2024-08-01 DIAGNOSIS — N760 Acute vaginitis: Secondary | ICD-10-CM | POA: Diagnosis not present

## 2024-08-01 MED ORDER — FLUCONAZOLE 150 MG PO TABS: 150.0000 mg | ORAL_TABLET | Freq: Every day | ORAL | 0 refills | Status: AC

## 2024-08-01 NOTE — Progress Notes (Signed)

## 2024-08-03 ENCOUNTER — Telehealth: Admitting: Physician Assistant

## 2024-08-03 DIAGNOSIS — B839 Helminthiasis, unspecified: Secondary | ICD-10-CM

## 2024-08-04 NOTE — Progress Notes (Signed)
  Because we cannot properly assess or treat for parasitic infection via e-visit, I feel your condition warrants further evaluation and I recommend that you be seen in a face-to-face visit.   NOTE: There will be NO CHARGE for this E-Visit   If you are having a true medical emergency, please call 911.     For an urgent face to face visit, [facility] has multiple urgent care centers for your convenience.  Click the link below for the full list of locations and hours, walk-in wait times, appointment scheduling options and driving directions:  Urgent Care - [place], [place], [place], [place], [place], [place]  [place]     Your MyChart E-visit questionnaire answers were reviewed by a board certified advanced clinical practitioner to complete your personal care plan based on your specific symptoms.    Thank you for using e-Visits.
